# Patient Record
Sex: Female | Born: 1935 | Race: White | Hispanic: No | Marital: Married | Smoking: Never smoker
Health system: Southern US, Community
[De-identification: ages and names within clinical notes are randomized; demographics above are authoritative.]

## PROBLEM LIST (undated history)

## (undated) DIAGNOSIS — D649 Anemia, unspecified: Secondary | ICD-10-CM

## (undated) DIAGNOSIS — E785 Hyperlipidemia, unspecified: Secondary | ICD-10-CM

## (undated) DIAGNOSIS — J339 Nasal polyp, unspecified: Secondary | ICD-10-CM

## (undated) DIAGNOSIS — I119 Hypertensive heart disease without heart failure: Secondary | ICD-10-CM

## (undated) DIAGNOSIS — I10 Essential (primary) hypertension: Secondary | ICD-10-CM

## (undated) DIAGNOSIS — E119 Type 2 diabetes mellitus without complications: Secondary | ICD-10-CM

## (undated) DIAGNOSIS — R079 Chest pain, unspecified: Secondary | ICD-10-CM

## (undated) DIAGNOSIS — Z9289 Personal history of other medical treatment: Secondary | ICD-10-CM

## (undated) DIAGNOSIS — J189 Pneumonia, unspecified organism: Secondary | ICD-10-CM

## (undated) DIAGNOSIS — K219 Gastro-esophageal reflux disease without esophagitis: Secondary | ICD-10-CM

## (undated) DIAGNOSIS — N184 Chronic kidney disease, stage 4 (severe): Secondary | ICD-10-CM

## (undated) DIAGNOSIS — R0600 Dyspnea, unspecified: Secondary | ICD-10-CM

## (undated) DIAGNOSIS — M1990 Unspecified osteoarthritis, unspecified site: Secondary | ICD-10-CM

## (undated) DIAGNOSIS — I2510 Atherosclerotic heart disease of native coronary artery without angina pectoris: Secondary | ICD-10-CM

## (undated) DIAGNOSIS — I509 Heart failure, unspecified: Secondary | ICD-10-CM

## (undated) HISTORY — DX: Essential (primary) hypertension: I10

## (undated) HISTORY — DX: Atherosclerotic heart disease of native coronary artery without angina pectoris: I25.10

## (undated) HISTORY — DX: Hyperlipidemia, unspecified: E78.5

## (undated) HISTORY — PX: CHOLECYSTECTOMY OPEN: SUR202

## (undated) HISTORY — DX: Hypertensive heart disease without heart failure: I11.9

## (undated) HISTORY — PX: NECK MASS EXCISION: SHX2079

## (undated) HISTORY — DX: Nasal polyp, unspecified: J33.9

## (undated) HISTORY — PX: CATARACT EXTRACTION W/ INTRAOCULAR LENS  IMPLANT, BILATERAL: SHX1307

## (undated) HISTORY — PX: TONSILLECTOMY: SUR1361

## (undated) HISTORY — DX: Heart failure, unspecified: I50.9

## (undated) HISTORY — PX: ABDOMINAL HYSTERECTOMY: SHX81

## (undated) HISTORY — PX: NASAL POLYP SURGERY: SHX186

---
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
## 2010-02-15 DIAGNOSIS — I2510 Atherosclerotic heart disease of native coronary artery without angina pectoris: Secondary | ICD-10-CM

## 2010-02-15 HISTORY — DX: Atherosclerotic heart disease of native coronary artery without angina pectoris: I25.10

## 2010-07-10 ENCOUNTER — Encounter: Payer: Self-pay | Admitting: Cardiovascular Disease

## 2010-07-10 ENCOUNTER — Ambulatory Visit (INDEPENDENT_AMBULATORY_CARE_PROVIDER_SITE_OTHER): Payer: Medicare Other | Admitting: Cardiovascular Disease

## 2010-07-10 VITALS — BP 166/60 | HR 55 | Ht 60.0 in | Wt 162.0 lb

## 2010-07-10 DIAGNOSIS — R0600 Dyspnea, unspecified: Secondary | ICD-10-CM

## 2010-07-10 DIAGNOSIS — I509 Heart failure, unspecified: Secondary | ICD-10-CM

## 2010-07-10 DIAGNOSIS — I10 Essential (primary) hypertension: Secondary | ICD-10-CM

## 2010-07-10 DIAGNOSIS — R0609 Other forms of dyspnea: Secondary | ICD-10-CM

## 2010-07-10 DIAGNOSIS — I2510 Atherosclerotic heart disease of native coronary artery without angina pectoris: Secondary | ICD-10-CM

## 2010-07-10 DIAGNOSIS — I5032 Chronic diastolic (congestive) heart failure: Secondary | ICD-10-CM | POA: Insufficient documentation

## 2010-07-10 MED ORDER — HYDRALAZINE HCL 50 MG PO TABS: 50.0000 mg | ORAL_TABLET | Freq: Two times a day (BID) | ORAL | Status: DC

## 2010-07-10 NOTE — Assessment & Plan Note (Signed)
The patient main symptom at this time include exertional dyspnea. We'll obtain a transthoracic echocardiogram for further evaluation. Continue current dose of Lasix 40 mg once daily.

## 2010-07-10 NOTE — Assessment & Plan Note (Signed)
The patient had a slightly abnormal nuclear stress test in February of this year. She does not have any chest pain at the present time. However, she does have symptoms of generalized fatigue and dyspnea with minimal activities. I think an attempt of medical therapy is reasonable. Continue aspirin daily as well as treatment with a statin. We'll need to control her blood pressure better. Her home blood pressure readings seems to be actually better than the office reading. Will consider coronary angiography in the future if there's no improvement in her functional capacity.

## 2010-07-10 NOTE — Progress Notes (Signed)
Kelly  This is an 77 year old female who is transferring her cardiovascular care. She used to see Dr. Pati Gallo. She was referred to cardiology in the past do to diastolic heart failure which was precipitated by use of Actos. She also had refractory hypertension with intolerance of multiple blood pressure medications. Early this year, she complained of increased exertional dyspnea and few episodes of chest pain. She underwent evaluation with a nuclear stress test which showed minimal apical ischemia in the anterolateral wall with normal ejection fraction. She was treated medically. The dose of diuretic was increased. Currently, she denies any chest pain. However, she complains of dyspnea with minimal activities as well as having no energy. This has been a progressive problem since early this year. She denies any orthopnea or PND. Had lower extremity edema is mostly controlled with current dose of Lasix. His home blood pressure readings were reviewed. Mostly actually it's under reasonable control. Her blood pressure is always elevated according to her when she visits a doctor.  Allergies  Allergen Reactions  . Actos (Pioglitazone Hydrochloride)   . Lisinopril   . Monopril (Fosinopril Sodium)   . Norvasc (Amlodipine Besylate)   . Penicillins   . Plendil      No current outpatient prescriptions on file prior to visit.     Past Medical History  Diagnosis Date  . DM (diabetes mellitus)   . Dyslipidemia   . Unspecified hypertensive heart disease without heart failure   . CHF (congestive heart failure)     Diastolic. Precipitated by Actos  . Coronary artery disease 02/2010    abnormal nuclear stress test with mild ischemia in apical anterolateral wall.   . Hypertension   . Chronic kidney disease     mild-moderate. GFR was 35 in 02/12     Past Surgical History  Procedure Date  . Tonsillectomy   . Hysterectomy - unknown type   . Cholecystectomy   . Cataract extraction, bilateral   . Neck mass  excision     non cancerous     Family History  Problem Relation Age of Onset  . Diabetes    . Hypertension    . Stroke       History   Social History  . Marital Status: Married    Spouse Name: N/A    Number of Children: 3  . Years of Education: N/A   Occupational History  . retired    Social History Main Topics  . Smoking status: Never Smoker   . Smokeless tobacco: Never Used  . Alcohol Use: No  . Drug Use: No  . Sexually Active: Not on file   Other Topics Concern  . Not on file   Social History Narrative  . No narrative on file     ROS Constitutional: Negative for fever, chills, diaphoresis, activity change, appetite change. HENT: Negative for hearing loss, nosebleeds, congestion, sore throat, facial swelling, drooling, trouble swallowing, neck pain, voice change, sinus pressure and tinnitus.  Eyes: Negative for photophobia, pain, discharge and visual disturbance.  Respiratory: Negative for apnea, cough, chest tightness and wheezing.  Cardiovascular: Negative for chest pain, palpitations.  Gastrointestinal: Negative for nausea, vomiting, abdominal pain, diarrhea, constipation, blood in stool and abdominal distention.  Genitourinary: Negative for dysuria, urgency, frequency, hematuria and decreased urine volume.  Musculoskeletal: Negative for myalgias, back pain, joint swelling, arthralgias and gait problem.  Skin: Negative for color change, pallor, rash and wound.  Neurological: Negative for dizziness, tremors, seizures, syncope, speech difficulty, weakness, light-headedness, numbness  and headaches.  Psychiatric/Behavioral: Negative for suicidal ideas, hallucinations, behavioral problems and agitation. The patient is not nervous/anxious.     PHYSICAL EXAM   BP 166/60  Pulse 55  Ht 5' (1.524 m)  Wt 162 lb (73.483 kg)  BMI 31.64 kg/m2  SpO2 98% Constitutional: She is oriented to person, place, and time. She appears well-developed and well-nourished. No  distress.  HENT: No nasal discharge.  Head: Normocephalic and atraumatic.  Eyes: Pupils are equal, round, and reactive to light. Right eye exhibits no discharge. Left eye exhibits no discharge.  Neck: Normal range of motion. Neck supple. No JVD present. No thyromegaly present.  Cardiovascular: Normal rate, regular rhythm, normal heart sounds and intact distal pulses. Exam reveals no gallop and no friction rub.  No murmur heard.  Pulmonary/Chest: Effort normal and breath sounds normal. No stridor. No respiratory distress. She has no wheezes. She has no rales. She exhibits no tenderness.  Abdominal: Soft. Bowel sounds are normal. She exhibits no distension. There is no tenderness. There is no rebound and no guarding.  Musculoskeletal: Normal range of motion. She exhibits trace edema and no tenderness.  Neurological: She is alert and oriented to person, place, and time. Coordination normal.  Skin: Skin is warm and dry. No rash noted. She is not diaphoretic. No erythema. No pallor.  Psychiatric: She has a normal mood and affect. Her behavior is normal. Judgment and thought content normal.     EKG: Sinus bradycardia, left ventricular hypertrophy, ST and T wave changes in the inferior as well as inferolateral leads suggestive of ischemia.   ASSESSMENT AND PLAN

## 2010-07-10 NOTE — Patient Instructions (Addendum)
Your physician recommends that you schedule a follow-up appointment in: 1 month  Your physician has recommended you make the following change in your medication: decrease Clonidine to 1/2 tab for the next 2 days then stop Start Hydralazine 50 mg twice daily  Your physician has requested that you have an echocardiogram. Echocardiography is a painless test that uses sound waves to create images of your heart. It provides your doctor with information about the size and shape of your heart and how well your heart's chambers and valves are working. This procedure takes approximately one hour. There are no restrictions for this procedure. Please arrive at [facility] at 12:30 for your Echo.

## 2010-07-10 NOTE — Assessment & Plan Note (Signed)
The patient complains of generalized fatigue which could partially be due to bradycardia as well as central effect of clonidine. Thus, I'd like to get her off clonidine if possible. Unfortunately, she is intolerant to multiple blood pressure medications. Calcium channel blockers goes significant lower extremity edema. She also thinks that hydrochlorothiazide was used and she did not tolerate it.  I will gradually stop clonidine over 2 days and start hydralazine 50 mg twice daily. I asked her to monitor her blood pressure daily. She will followup with me in one month from now or earlier if needed.

## 2010-07-17 ENCOUNTER — Telehealth: Payer: Self-pay | Admitting: *Deleted

## 2010-07-17 MED ORDER — HYDRALAZINE HCL 50 MG PO TABS: 25.0000 mg | ORAL_TABLET | Freq: Two times a day (BID) | ORAL | Status: DC

## 2010-07-17 NOTE — Telephone Encounter (Signed)
She is feeling weak and fatigued, her BP has been low -- 30th 115/53, 1st 109/51, 2nd 111/53 & 118/60. Last time she was in we added Hydralazine 50 mg twice daily. Spoke to Dr Fletcher Anon and he said to have her cut the pill in half and take 25 mg twice a day. Pt aware.

## 2010-08-03 ENCOUNTER — Other Ambulatory Visit: Payer: Self-pay | Admitting: *Deleted

## 2010-08-03 MED ORDER — CARVEDILOL 12.5 MG PO TABS: 12.5000 mg | ORAL_TABLET | Freq: Two times a day (BID) | ORAL | Status: DC

## 2010-08-10 ENCOUNTER — Encounter: Payer: Self-pay | Admitting: Cardiovascular Disease

## 2010-08-14 ENCOUNTER — Encounter: Payer: Self-pay | Admitting: Cardiovascular Disease

## 2010-08-14 ENCOUNTER — Ambulatory Visit (INDEPENDENT_AMBULATORY_CARE_PROVIDER_SITE_OTHER): Payer: Medicare Other | Admitting: Cardiovascular Disease

## 2010-08-14 DIAGNOSIS — I2510 Atherosclerotic heart disease of native coronary artery without angina pectoris: Secondary | ICD-10-CM

## 2010-08-14 DIAGNOSIS — I509 Heart failure, unspecified: Secondary | ICD-10-CM

## 2010-08-14 DIAGNOSIS — I10 Essential (primary) hypertension: Secondary | ICD-10-CM

## 2010-08-14 NOTE — Progress Notes (Signed)
HPI  This is an 77 year old female who is here today for followup visit. She was just seen recently to establish cardiovascular care. She has presumed coronary artery disease based on a slightly abnormal nuclear stress test. She had heart failure which was triggered by the use of Actos. She also has history of refractory hypertension with intolerance to multiple medications. During her last visit, she complained of slight dizziness and being tired and fatigued. This was felt to be due to bradycardia from clonidine. Thus I weaned her off the medication and start her instead on hydralazine 50 mg twice daily. She called our office about a week later complaining of hypotension. I decreased the dose to 25 mg twice daily. Since then, she has been feeling better. She actually has more energy. Her blood pressure has been under excellent control according to her home blood pressure readings. Most of the time, her systolic blood pressure is ranging between 115 to 130. She denies any chest pain at this time.  Allergies  Allergen Reactions  . Actos (Pioglitazone Hydrochloride)   . Hctz (Hydrochlorothiazide)   . Lisinopril   . Monopril (Fosinopril Sodium)   . Norvasc (Amlodipine Besylate)   . Penicillins   . Plendil      Current Outpatient Prescriptions on File Prior to Visit  Medication Sig Dispense Refill  . amitriptyline (ELAVIL) 25 MG tablet Take 1 tablet by mouth daily.      [name] aspirin 81 MG tablet Take 81 mg by mouth daily.        . brimonidine-timolol (COMBIGAN) 0.2-0.5 % ophthalmic solution Place 1 drop into the left eye daily.       . carvedilol (COREG) 12.5 MG tablet Take 1 tablet (12.5 mg total) by mouth 2 (two) times daily with a meal.  60 tablet  6  . CRESTOR 5 MG tablet Take 1 tablet by mouth daily.      [name] dicyclomine (BENTYL) 10 MG capsule Take 10 mg by mouth 4 (four) times daily -  before meals and at bedtime.        . furosemide (LASIX) 40 MG tablet Take 1 tablet by mouth daily.      [name]  glipiZIDE (GLUCOTROL) 5 MG tablet Take 0.5 tablets by mouth Twice daily.      . hydrALAZINE (APRESOLINE) 50 MG tablet Take 0.5 tablets (25 mg total) by mouth 2 (two) times daily.  60 tablet  3  . losartan (COZAAR) 100 MG tablet Take 1 tablet by mouth daily.      [name] omeprazole (PRILOSEC OTC) 20 MG tablet Take 20 mg by mouth daily.        . TRAVATAN Z 0.004 % ophthalmic solution Place 1 drop into both eyes At bedtime.         Past Medical History  Diagnosis Date  . DM (diabetes mellitus)   . Dyslipidemia   . Unspecified hypertensive heart disease without heart failure   . CHF (congestive heart failure)     Diastolic. Precipitated by Actos  . Coronary artery disease 02/2010    abnormal nuclear stress test with mild ischemia in apical anterolateral wall.   . Hypertension   . Chronic kidney disease     mild-moderate. GFR was 35 in 02/12     Past Surgical History  Procedure Date  . Tonsillectomy   . Hysterectomy - unknown type   . Cholecystectomy   . Cataract extraction, bilateral   . Neck mass excision     non cancerous  Family History  Problem Relation Age of Onset  . Diabetes    . Hypertension    . Stroke       History   Social History  . Marital Status: Married    Spouse Name: N/A    Number of Children: 3  . Years of Education: N/A   Occupational History  . retired    Social History Main Topics  . Smoking status: Never Smoker   . Smokeless tobacco: Never Used  . Alcohol Use: No  . Drug Use: No  . Sexually Active: Not on file   Other Topics Concern  . Not on file   Social History Narrative  . No narrative on file       PHYSICAL EXAM   BP 155/70  Pulse 69  Ht 5' (1.524 m)  Wt 162 lb (73.483 kg)  BMI 31.64 kg/m2  SpO2 96%  Constitutional: She is oriented to person, place, and time. She appears well-developed and well-nourished. No distress.  HENT: No nasal discharge.  Head: Normocephalic and atraumatic.  Eyes: Pupils are equal, round,  and reactive to light. Right eye exhibits no discharge. Left eye exhibits no discharge.  Neck: Normal range of motion. Neck supple. No JVD present. No thyromegaly present.  Cardiovascular: Normal rate, regular rhythm, normal heart sounds and intact distal pulses. Exam reveals no gallop and no friction rub.  No murmur heard.  Pulmonary/Chest: Effort normal and breath sounds normal. No stridor. No respiratory distress. She has no wheezes. She has no rales. She exhibits no tenderness.  Abdominal: Soft. Bowel sounds are normal. She exhibits no distension. There is no tenderness. There is no rebound and no guarding.  Musculoskeletal: Normal range of motion. She exhibits no edema and no tenderness.  Neurological: She is alert and oriented to person, place, and time. Coordination normal.  Skin: Skin is warm and dry. No rash noted. She is not diaphoretic. No erythema. No pallor.  Psychiatric: She has a normal mood and affect. Her behavior is normal. Judgment and thought content normal.      ASSESSMENT AND PLAN

## 2010-08-14 NOTE — Assessment & Plan Note (Signed)
This overall seems to be mild and do to diastolic dysfunction. Her echocardiogram showed normal LV systolic function without any significant valvular abnormalities. Continue current dose of furosemide 40 mg once daily. I discussed with her the importance of low sodium diet and also suggested that she weighs herself twice a week.

## 2010-08-14 NOTE — Assessment & Plan Note (Signed)
The patient has no clear evidence of angina at this time. Continue medical therapy with aspirin 81 mg daily, carvedilol and small dose Crestor.

## 2010-08-14 NOTE — Patient Instructions (Signed)
Your physician recommends that you schedule a follow-up appointment in: 3 months.  

## 2010-08-14 NOTE — Assessment & Plan Note (Signed)
Her blood pressure is reasonably controlled now. Will keep her off clonidine. Continue hydralazine 25 mg twice daily which can be increased if needed. I instructed her to take 50 mg of her systolic blood pressure is about 160.

## 2010-11-17 ENCOUNTER — Ambulatory Visit (INDEPENDENT_AMBULATORY_CARE_PROVIDER_SITE_OTHER): Payer: Medicare Other | Admitting: Cardiology

## 2010-11-17 ENCOUNTER — Encounter: Payer: Self-pay | Admitting: Cardiology

## 2010-11-17 DIAGNOSIS — I509 Heart failure, unspecified: Secondary | ICD-10-CM

## 2010-11-17 DIAGNOSIS — I2510 Atherosclerotic heart disease of native coronary artery without angina pectoris: Secondary | ICD-10-CM

## 2010-11-17 DIAGNOSIS — I10 Essential (primary) hypertension: Secondary | ICD-10-CM

## 2010-11-17 NOTE — Patient Instructions (Signed)
The current medical regimen is effective;  continue present plan and medications.  Follow up in 1 year with Dr Hochrein.  You will receive a letter in the mail 2 months before you are due.  Please call us when you receive this letter to schedule your follow up appointment.  

## 2010-11-17 NOTE — Progress Notes (Signed)
HPI The patient presents as a new patient to me. She was formerly seen by Dr. Fletcher Anon.  She has a history apparently of congestive heart failure with a preserved ejection fraction. This was particularly problematic when she was taking Actos. However, since coming off of this medication and having good control of her blood pressure she's had no further problems with the dyspnea that used to bother her. She can ride her exercise bicycle 3 miles daily. With this she does not get chest pressure, neck or arm discomfort. She might get short of breath walking at an increased pace if she is in a hurry. She does not have any resting shortness of breath, PND or orthopnea.  Allergies  Allergen Reactions  . Actos (Pioglitazone Hydrochloride)   . Hctz (Hydrochlorothiazide)   . Lisinopril   . Monopril (Fosinopril Sodium)   . Norvasc (Amlodipine Besylate)   . Penicillins   . Plendil     Current Outpatient Prescriptions  Medication Sig Dispense Refill  . amitriptyline (ELAVIL) 25 MG tablet Take 1 tablet by mouth daily.      [name] aspirin 81 MG tablet Take 81 mg by mouth daily.        . brimonidine-timolol (COMBIGAN) 0.2-0.5 % ophthalmic solution Place 1 drop into the left eye daily.       . carvedilol (COREG) 12.5 MG tablet Take 1 tablet (12.5 mg total) by mouth 2 (two) times daily with a meal.  60 tablet  6  . CRESTOR 5 MG tablet Take 1 tablet by mouth daily.      [name] dicyclomine (BENTYL) 10 MG capsule Take 10 mg by mouth 4 (four) times daily -  before meals and at bedtime.        . furosemide (LASIX) 40 MG tablet Take 1 tablet by mouth daily.      [name] glipiZIDE (GLUCOTROL) 5 MG tablet Take 0.5 tablets by mouth Twice daily.      . hydrALAZINE (APRESOLINE) 50 MG tablet Take 0.5 tablets (25 mg total) by mouth 2 (two) times daily.  60 tablet  3  . LANTUS SOLOSTAR 100 UNIT/ML injection as directed.      [name] losartan (COZAAR) 100 MG tablet Take 1 tablet by mouth daily.      [name] omeprazole (PRILOSEC OTC) 20 MG tablet Take  20 mg by mouth daily.        . TRAVATAN Z 0.004 % ophthalmic solution Place 1 drop into both eyes At bedtime.      . predniSONE (DELTASONE) 5 MG tablet Take 5 mg by mouth daily.          Past Medical History  Diagnosis Date  . DM (diabetes mellitus)   . Dyslipidemia   . Unspecified hypertensive heart disease without heart failure   . CHF (congestive heart failure)     Diastolic. Precipitated by Actos  . Coronary artery disease 02/2010    abnormal nuclear stress test with mild ischemia in apical anterolateral wall.   . Hypertension   . Chronic kidney disease     mild-moderate. GFR was 35 in 02/12    Past Surgical History  Procedure Date  . Tonsillectomy   . Hysterectomy - unknown type   . Cholecystectomy   . Cataract extraction, bilateral   . Neck mass excision     non cancerous    ROS:  As stated in the HPI and negative for all other systems.  PHYSICAL EXAM BP 140/72  Pulse 68  Resp 18  Ht  5' (1.524 m)  Wt 160 lb (72.576 kg)  BMI 31.25 kg/m2 GENERAL:  Well appearing HEENT:  Pupils equal round and reactive, fundi not visualized, oral mucosa unremarkable NECK:  No jugular venous distention, waveform within normal limits, carotid upstroke brisk and symmetric, no bruits, no thyromegaly LYMPHATICS:  No cervical, inguinal adenopathy LUNGS:  Clear to auscultation bilaterally BACK:  No CVA tenderness CHEST:  Unremarkable HEART:  PMI not displaced or sustained,S1 and S2 within normal limits, no S3, no S4, no clicks, no rubs, no murmurs ABD:  Flat, positive bowel sounds normal in frequency in pitch, no bruits, no rebound, no guarding, no midline pulsatile mass, no hepatomegaly, no splenomegaly EXT:  2 plus pulses throughout, no edema, no cyanosis no clubbing SKIN:  No rashes no nodules NEURO:  Cranial nerves II through XII grossly intact, motor grossly intact throughout PSYCH:  Cognitively intact, oriented to person place and time  EKG: Sinus rhythm, rate 61, lethargy  hypertrophy, left axis deviation, repolarization changes  ASSESSMENT AND PLAN

## 2010-11-17 NOTE — Assessment & Plan Note (Signed)
I reviewed all of the available office records. She had an echocardiogram earlier this year. She had a stress perfusion study. Currently she has minimal symptoms. We discussed at length management with blood pressure control, salt and fluid restriction and diuretics. No further imaging or change in therapy is indicated.

## 2010-11-17 NOTE — Assessment & Plan Note (Signed)
She reports to me her blood pressures are 130s over 60s at home. She tolerates the medications currently listed. No change in therapy is indicated. We again talked about therapeutic lifestyle changes that can continue to help her keep good blood pressure control.

## 2010-11-17 NOTE — Assessment & Plan Note (Signed)
She did have a stress perfusion study with very mild ischemia in the apical distribution. However, this was felt to be quite low risk. She's not having any current symptoms. No change in therapy or further imaging is indicated.

## 2011-01-25 DIAGNOSIS — Z23 Encounter for immunization: Secondary | ICD-10-CM | POA: Diagnosis not present

## 2011-01-25 DIAGNOSIS — I10 Essential (primary) hypertension: Secondary | ICD-10-CM | POA: Diagnosis not present

## 2011-01-25 DIAGNOSIS — E119 Type 2 diabetes mellitus without complications: Secondary | ICD-10-CM | POA: Diagnosis not present

## 2011-01-25 DIAGNOSIS — E7800 Pure hypercholesterolemia, unspecified: Secondary | ICD-10-CM | POA: Diagnosis not present

## 2011-02-20 DIAGNOSIS — H4011X0 Primary open-angle glaucoma, stage unspecified: Secondary | ICD-10-CM | POA: Diagnosis not present

## 2011-02-20 DIAGNOSIS — Z961 Presence of intraocular lens: Secondary | ICD-10-CM | POA: Diagnosis not present

## 2011-02-20 DIAGNOSIS — E11319 Type 2 diabetes mellitus with unspecified diabetic retinopathy without macular edema: Secondary | ICD-10-CM | POA: Diagnosis not present

## 2011-02-27 DIAGNOSIS — E11319 Type 2 diabetes mellitus with unspecified diabetic retinopathy without macular edema: Secondary | ICD-10-CM | POA: Diagnosis not present

## 2011-02-27 DIAGNOSIS — H4011X0 Primary open-angle glaucoma, stage unspecified: Secondary | ICD-10-CM | POA: Diagnosis not present

## 2011-04-30 DIAGNOSIS — M5412 Radiculopathy, cervical region: Secondary | ICD-10-CM | POA: Diagnosis not present

## 2011-04-30 DIAGNOSIS — G5680 Other specified mononeuropathies of unspecified upper limb: Secondary | ICD-10-CM | POA: Diagnosis not present

## 2011-04-30 DIAGNOSIS — IMO0002 Reserved for concepts with insufficient information to code with codable children: Secondary | ICD-10-CM | POA: Diagnosis not present

## 2011-04-30 DIAGNOSIS — M9981 Other biomechanical lesions of cervical region: Secondary | ICD-10-CM | POA: Diagnosis not present

## 2011-04-30 DIAGNOSIS — M999 Biomechanical lesion, unspecified: Secondary | ICD-10-CM | POA: Diagnosis not present

## 2011-05-01 DIAGNOSIS — M5412 Radiculopathy, cervical region: Secondary | ICD-10-CM | POA: Diagnosis not present

## 2011-05-01 DIAGNOSIS — IMO0002 Reserved for concepts with insufficient information to code with codable children: Secondary | ICD-10-CM | POA: Diagnosis not present

## 2011-05-01 DIAGNOSIS — M999 Biomechanical lesion, unspecified: Secondary | ICD-10-CM | POA: Diagnosis not present

## 2011-05-01 DIAGNOSIS — M9981 Other biomechanical lesions of cervical region: Secondary | ICD-10-CM | POA: Diagnosis not present

## 2011-05-02 DIAGNOSIS — M999 Biomechanical lesion, unspecified: Secondary | ICD-10-CM | POA: Diagnosis not present

## 2011-05-02 DIAGNOSIS — M9981 Other biomechanical lesions of cervical region: Secondary | ICD-10-CM | POA: Diagnosis not present

## 2011-05-02 DIAGNOSIS — IMO0002 Reserved for concepts with insufficient information to code with codable children: Secondary | ICD-10-CM | POA: Diagnosis not present

## 2011-05-02 DIAGNOSIS — M5412 Radiculopathy, cervical region: Secondary | ICD-10-CM | POA: Diagnosis not present

## 2011-05-03 DIAGNOSIS — M9981 Other biomechanical lesions of cervical region: Secondary | ICD-10-CM | POA: Diagnosis not present

## 2011-05-03 DIAGNOSIS — M999 Biomechanical lesion, unspecified: Secondary | ICD-10-CM | POA: Diagnosis not present

## 2011-05-03 DIAGNOSIS — IMO0002 Reserved for concepts with insufficient information to code with codable children: Secondary | ICD-10-CM | POA: Diagnosis not present

## 2011-05-03 DIAGNOSIS — M5412 Radiculopathy, cervical region: Secondary | ICD-10-CM | POA: Diagnosis not present

## 2011-05-07 DIAGNOSIS — M999 Biomechanical lesion, unspecified: Secondary | ICD-10-CM | POA: Diagnosis not present

## 2011-05-07 DIAGNOSIS — M9981 Other biomechanical lesions of cervical region: Secondary | ICD-10-CM | POA: Diagnosis not present

## 2011-05-07 DIAGNOSIS — M5412 Radiculopathy, cervical region: Secondary | ICD-10-CM | POA: Diagnosis not present

## 2011-05-07 DIAGNOSIS — IMO0002 Reserved for concepts with insufficient information to code with codable children: Secondary | ICD-10-CM | POA: Diagnosis not present

## 2011-05-08 DIAGNOSIS — M5412 Radiculopathy, cervical region: Secondary | ICD-10-CM | POA: Diagnosis not present

## 2011-05-08 DIAGNOSIS — IMO0002 Reserved for concepts with insufficient information to code with codable children: Secondary | ICD-10-CM | POA: Diagnosis not present

## 2011-05-08 DIAGNOSIS — M9981 Other biomechanical lesions of cervical region: Secondary | ICD-10-CM | POA: Diagnosis not present

## 2011-05-08 DIAGNOSIS — M999 Biomechanical lesion, unspecified: Secondary | ICD-10-CM | POA: Diagnosis not present

## 2011-05-09 DIAGNOSIS — M999 Biomechanical lesion, unspecified: Secondary | ICD-10-CM | POA: Diagnosis not present

## 2011-05-09 DIAGNOSIS — IMO0002 Reserved for concepts with insufficient information to code with codable children: Secondary | ICD-10-CM | POA: Diagnosis not present

## 2011-05-09 DIAGNOSIS — M5412 Radiculopathy, cervical region: Secondary | ICD-10-CM | POA: Diagnosis not present

## 2011-05-09 DIAGNOSIS — M9981 Other biomechanical lesions of cervical region: Secondary | ICD-10-CM | POA: Diagnosis not present

## 2011-05-10 DIAGNOSIS — IMO0002 Reserved for concepts with insufficient information to code with codable children: Secondary | ICD-10-CM | POA: Diagnosis not present

## 2011-05-10 DIAGNOSIS — M5412 Radiculopathy, cervical region: Secondary | ICD-10-CM | POA: Diagnosis not present

## 2011-05-10 DIAGNOSIS — M999 Biomechanical lesion, unspecified: Secondary | ICD-10-CM | POA: Diagnosis not present

## 2011-05-10 DIAGNOSIS — M9981 Other biomechanical lesions of cervical region: Secondary | ICD-10-CM | POA: Diagnosis not present

## 2011-05-14 DIAGNOSIS — IMO0002 Reserved for concepts with insufficient information to code with codable children: Secondary | ICD-10-CM | POA: Diagnosis not present

## 2011-05-14 DIAGNOSIS — M9981 Other biomechanical lesions of cervical region: Secondary | ICD-10-CM | POA: Diagnosis not present

## 2011-05-14 DIAGNOSIS — M5412 Radiculopathy, cervical region: Secondary | ICD-10-CM | POA: Diagnosis not present

## 2011-05-14 DIAGNOSIS — M999 Biomechanical lesion, unspecified: Secondary | ICD-10-CM | POA: Diagnosis not present

## 2011-05-15 DIAGNOSIS — M5412 Radiculopathy, cervical region: Secondary | ICD-10-CM | POA: Diagnosis not present

## 2011-05-15 DIAGNOSIS — M999 Biomechanical lesion, unspecified: Secondary | ICD-10-CM | POA: Diagnosis not present

## 2011-05-15 DIAGNOSIS — IMO0002 Reserved for concepts with insufficient information to code with codable children: Secondary | ICD-10-CM | POA: Diagnosis not present

## 2011-05-15 DIAGNOSIS — M9981 Other biomechanical lesions of cervical region: Secondary | ICD-10-CM | POA: Diagnosis not present

## 2011-05-17 DIAGNOSIS — M9981 Other biomechanical lesions of cervical region: Secondary | ICD-10-CM | POA: Diagnosis not present

## 2011-05-17 DIAGNOSIS — IMO0002 Reserved for concepts with insufficient information to code with codable children: Secondary | ICD-10-CM | POA: Diagnosis not present

## 2011-05-17 DIAGNOSIS — M5412 Radiculopathy, cervical region: Secondary | ICD-10-CM | POA: Diagnosis not present

## 2011-05-17 DIAGNOSIS — M999 Biomechanical lesion, unspecified: Secondary | ICD-10-CM | POA: Diagnosis not present

## 2011-05-22 DIAGNOSIS — M5412 Radiculopathy, cervical region: Secondary | ICD-10-CM | POA: Diagnosis not present

## 2011-05-22 DIAGNOSIS — M9981 Other biomechanical lesions of cervical region: Secondary | ICD-10-CM | POA: Diagnosis not present

## 2011-05-22 DIAGNOSIS — M999 Biomechanical lesion, unspecified: Secondary | ICD-10-CM | POA: Diagnosis not present

## 2011-05-22 DIAGNOSIS — IMO0002 Reserved for concepts with insufficient information to code with codable children: Secondary | ICD-10-CM | POA: Diagnosis not present

## 2011-05-23 DIAGNOSIS — M9981 Other biomechanical lesions of cervical region: Secondary | ICD-10-CM | POA: Diagnosis not present

## 2011-05-23 DIAGNOSIS — M5412 Radiculopathy, cervical region: Secondary | ICD-10-CM | POA: Diagnosis not present

## 2011-05-23 DIAGNOSIS — IMO0002 Reserved for concepts with insufficient information to code with codable children: Secondary | ICD-10-CM | POA: Diagnosis not present

## 2011-05-23 DIAGNOSIS — M999 Biomechanical lesion, unspecified: Secondary | ICD-10-CM | POA: Diagnosis not present

## 2011-05-24 DIAGNOSIS — M9981 Other biomechanical lesions of cervical region: Secondary | ICD-10-CM | POA: Diagnosis not present

## 2011-05-24 DIAGNOSIS — M5412 Radiculopathy, cervical region: Secondary | ICD-10-CM | POA: Diagnosis not present

## 2011-05-24 DIAGNOSIS — M999 Biomechanical lesion, unspecified: Secondary | ICD-10-CM | POA: Diagnosis not present

## 2011-05-24 DIAGNOSIS — IMO0002 Reserved for concepts with insufficient information to code with codable children: Secondary | ICD-10-CM | POA: Diagnosis not present

## 2011-05-28 DIAGNOSIS — M5412 Radiculopathy, cervical region: Secondary | ICD-10-CM | POA: Diagnosis not present

## 2011-05-28 DIAGNOSIS — M9981 Other biomechanical lesions of cervical region: Secondary | ICD-10-CM | POA: Diagnosis not present

## 2011-05-28 DIAGNOSIS — M999 Biomechanical lesion, unspecified: Secondary | ICD-10-CM | POA: Diagnosis not present

## 2011-05-28 DIAGNOSIS — IMO0002 Reserved for concepts with insufficient information to code with codable children: Secondary | ICD-10-CM | POA: Diagnosis not present

## 2011-05-31 DIAGNOSIS — M5412 Radiculopathy, cervical region: Secondary | ICD-10-CM | POA: Diagnosis not present

## 2011-05-31 DIAGNOSIS — H35019 Changes in retinal vascular appearance, unspecified eye: Secondary | ICD-10-CM | POA: Diagnosis not present

## 2011-05-31 DIAGNOSIS — M9981 Other biomechanical lesions of cervical region: Secondary | ICD-10-CM | POA: Diagnosis not present

## 2011-05-31 DIAGNOSIS — H4011X0 Primary open-angle glaucoma, stage unspecified: Secondary | ICD-10-CM | POA: Diagnosis not present

## 2011-05-31 DIAGNOSIS — H34239 Retinal artery branch occlusion, unspecified eye: Secondary | ICD-10-CM | POA: Diagnosis not present

## 2011-05-31 DIAGNOSIS — M999 Biomechanical lesion, unspecified: Secondary | ICD-10-CM | POA: Diagnosis not present

## 2011-05-31 DIAGNOSIS — IMO0002 Reserved for concepts with insufficient information to code with codable children: Secondary | ICD-10-CM | POA: Diagnosis not present

## 2011-06-04 DIAGNOSIS — M5412 Radiculopathy, cervical region: Secondary | ICD-10-CM | POA: Diagnosis not present

## 2011-06-04 DIAGNOSIS — M999 Biomechanical lesion, unspecified: Secondary | ICD-10-CM | POA: Diagnosis not present

## 2011-06-04 DIAGNOSIS — M9981 Other biomechanical lesions of cervical region: Secondary | ICD-10-CM | POA: Diagnosis not present

## 2011-06-04 DIAGNOSIS — IMO0002 Reserved for concepts with insufficient information to code with codable children: Secondary | ICD-10-CM | POA: Diagnosis not present

## 2011-06-05 ENCOUNTER — Encounter (INDEPENDENT_AMBULATORY_CARE_PROVIDER_SITE_OTHER): Payer: Medicare Other | Admitting: Ophthalmology

## 2011-06-05 DIAGNOSIS — E1139 Type 2 diabetes mellitus with other diabetic ophthalmic complication: Secondary | ICD-10-CM

## 2011-06-05 DIAGNOSIS — H3530 Unspecified macular degeneration: Secondary | ICD-10-CM | POA: Diagnosis not present

## 2011-06-05 DIAGNOSIS — I10 Essential (primary) hypertension: Secondary | ICD-10-CM | POA: Diagnosis not present

## 2011-06-05 DIAGNOSIS — E11319 Type 2 diabetes mellitus with unspecified diabetic retinopathy without macular edema: Secondary | ICD-10-CM

## 2011-06-05 DIAGNOSIS — H348392 Tributary (branch) retinal vein occlusion, unspecified eye, stable: Secondary | ICD-10-CM

## 2011-06-05 DIAGNOSIS — H35039 Hypertensive retinopathy, unspecified eye: Secondary | ICD-10-CM | POA: Diagnosis not present

## 2011-06-05 DIAGNOSIS — H43819 Vitreous degeneration, unspecified eye: Secondary | ICD-10-CM

## 2011-06-07 DIAGNOSIS — M9981 Other biomechanical lesions of cervical region: Secondary | ICD-10-CM | POA: Diagnosis not present

## 2011-06-07 DIAGNOSIS — M5412 Radiculopathy, cervical region: Secondary | ICD-10-CM | POA: Diagnosis not present

## 2011-06-07 DIAGNOSIS — IMO0002 Reserved for concepts with insufficient information to code with codable children: Secondary | ICD-10-CM | POA: Diagnosis not present

## 2011-06-07 DIAGNOSIS — M999 Biomechanical lesion, unspecified: Secondary | ICD-10-CM | POA: Diagnosis not present

## 2011-06-12 DIAGNOSIS — M5412 Radiculopathy, cervical region: Secondary | ICD-10-CM | POA: Diagnosis not present

## 2011-06-12 DIAGNOSIS — M9981 Other biomechanical lesions of cervical region: Secondary | ICD-10-CM | POA: Diagnosis not present

## 2011-06-12 DIAGNOSIS — IMO0002 Reserved for concepts with insufficient information to code with codable children: Secondary | ICD-10-CM | POA: Diagnosis not present

## 2011-06-12 DIAGNOSIS — M999 Biomechanical lesion, unspecified: Secondary | ICD-10-CM | POA: Diagnosis not present

## 2011-06-13 DIAGNOSIS — H4011X0 Primary open-angle glaucoma, stage unspecified: Secondary | ICD-10-CM | POA: Diagnosis not present

## 2011-06-13 DIAGNOSIS — Z961 Presence of intraocular lens: Secondary | ICD-10-CM | POA: Diagnosis not present

## 2011-06-13 DIAGNOSIS — H35379 Puckering of macula, unspecified eye: Secondary | ICD-10-CM | POA: Diagnosis not present

## 2011-06-13 DIAGNOSIS — E11319 Type 2 diabetes mellitus with unspecified diabetic retinopathy without macular edema: Secondary | ICD-10-CM | POA: Diagnosis not present

## 2011-06-14 DIAGNOSIS — M5412 Radiculopathy, cervical region: Secondary | ICD-10-CM | POA: Diagnosis not present

## 2011-06-14 DIAGNOSIS — M9981 Other biomechanical lesions of cervical region: Secondary | ICD-10-CM | POA: Diagnosis not present

## 2011-06-14 DIAGNOSIS — M999 Biomechanical lesion, unspecified: Secondary | ICD-10-CM | POA: Diagnosis not present

## 2011-06-14 DIAGNOSIS — IMO0002 Reserved for concepts with insufficient information to code with codable children: Secondary | ICD-10-CM | POA: Diagnosis not present

## 2011-06-18 DIAGNOSIS — M5412 Radiculopathy, cervical region: Secondary | ICD-10-CM | POA: Diagnosis not present

## 2011-06-18 DIAGNOSIS — IMO0002 Reserved for concepts with insufficient information to code with codable children: Secondary | ICD-10-CM | POA: Diagnosis not present

## 2011-06-18 DIAGNOSIS — M9981 Other biomechanical lesions of cervical region: Secondary | ICD-10-CM | POA: Diagnosis not present

## 2011-06-18 DIAGNOSIS — M999 Biomechanical lesion, unspecified: Secondary | ICD-10-CM | POA: Diagnosis not present

## 2011-06-20 DIAGNOSIS — E119 Type 2 diabetes mellitus without complications: Secondary | ICD-10-CM | POA: Diagnosis not present

## 2011-06-20 DIAGNOSIS — H348392 Tributary (branch) retinal vein occlusion, unspecified eye, stable: Secondary | ICD-10-CM | POA: Diagnosis not present

## 2011-06-20 DIAGNOSIS — Z961 Presence of intraocular lens: Secondary | ICD-10-CM | POA: Diagnosis not present

## 2011-06-20 DIAGNOSIS — H4011X0 Primary open-angle glaucoma, stage unspecified: Secondary | ICD-10-CM | POA: Diagnosis not present

## 2011-06-21 DIAGNOSIS — M5412 Radiculopathy, cervical region: Secondary | ICD-10-CM | POA: Diagnosis not present

## 2011-06-21 DIAGNOSIS — IMO0002 Reserved for concepts with insufficient information to code with codable children: Secondary | ICD-10-CM | POA: Diagnosis not present

## 2011-06-21 DIAGNOSIS — M9981 Other biomechanical lesions of cervical region: Secondary | ICD-10-CM | POA: Diagnosis not present

## 2011-06-21 DIAGNOSIS — M999 Biomechanical lesion, unspecified: Secondary | ICD-10-CM | POA: Diagnosis not present

## 2011-06-27 DIAGNOSIS — H409 Unspecified glaucoma: Secondary | ICD-10-CM | POA: Diagnosis not present

## 2011-06-27 DIAGNOSIS — H4011X0 Primary open-angle glaucoma, stage unspecified: Secondary | ICD-10-CM | POA: Diagnosis not present

## 2011-06-28 DIAGNOSIS — M5412 Radiculopathy, cervical region: Secondary | ICD-10-CM | POA: Diagnosis not present

## 2011-06-28 DIAGNOSIS — M999 Biomechanical lesion, unspecified: Secondary | ICD-10-CM | POA: Diagnosis not present

## 2011-06-28 DIAGNOSIS — M9981 Other biomechanical lesions of cervical region: Secondary | ICD-10-CM | POA: Diagnosis not present

## 2011-06-28 DIAGNOSIS — IMO0002 Reserved for concepts with insufficient information to code with codable children: Secondary | ICD-10-CM | POA: Diagnosis not present

## 2011-07-04 DIAGNOSIS — M9981 Other biomechanical lesions of cervical region: Secondary | ICD-10-CM | POA: Diagnosis not present

## 2011-07-04 DIAGNOSIS — H409 Unspecified glaucoma: Secondary | ICD-10-CM | POA: Diagnosis not present

## 2011-07-04 DIAGNOSIS — M999 Biomechanical lesion, unspecified: Secondary | ICD-10-CM | POA: Diagnosis not present

## 2011-07-04 DIAGNOSIS — IMO0002 Reserved for concepts with insufficient information to code with codable children: Secondary | ICD-10-CM | POA: Diagnosis not present

## 2011-07-04 DIAGNOSIS — M5412 Radiculopathy, cervical region: Secondary | ICD-10-CM | POA: Diagnosis not present

## 2011-07-04 DIAGNOSIS — H4011X0 Primary open-angle glaucoma, stage unspecified: Secondary | ICD-10-CM | POA: Diagnosis not present

## 2011-07-25 DIAGNOSIS — Z961 Presence of intraocular lens: Secondary | ICD-10-CM | POA: Diagnosis not present

## 2011-07-25 DIAGNOSIS — E119 Type 2 diabetes mellitus without complications: Secondary | ICD-10-CM | POA: Diagnosis not present

## 2011-07-25 DIAGNOSIS — H4011X0 Primary open-angle glaucoma, stage unspecified: Secondary | ICD-10-CM | POA: Diagnosis not present

## 2011-07-31 DIAGNOSIS — M9981 Other biomechanical lesions of cervical region: Secondary | ICD-10-CM | POA: Diagnosis not present

## 2011-07-31 DIAGNOSIS — IMO0002 Reserved for concepts with insufficient information to code with codable children: Secondary | ICD-10-CM | POA: Diagnosis not present

## 2011-07-31 DIAGNOSIS — K219 Gastro-esophageal reflux disease without esophagitis: Secondary | ICD-10-CM | POA: Diagnosis not present

## 2011-07-31 DIAGNOSIS — M5412 Radiculopathy, cervical region: Secondary | ICD-10-CM | POA: Diagnosis not present

## 2011-07-31 DIAGNOSIS — E7800 Pure hypercholesterolemia, unspecified: Secondary | ICD-10-CM | POA: Diagnosis not present

## 2011-07-31 DIAGNOSIS — E119 Type 2 diabetes mellitus without complications: Secondary | ICD-10-CM | POA: Diagnosis not present

## 2011-07-31 DIAGNOSIS — M999 Biomechanical lesion, unspecified: Secondary | ICD-10-CM | POA: Diagnosis not present

## 2011-08-21 DIAGNOSIS — M9981 Other biomechanical lesions of cervical region: Secondary | ICD-10-CM | POA: Diagnosis not present

## 2011-08-21 DIAGNOSIS — IMO0002 Reserved for concepts with insufficient information to code with codable children: Secondary | ICD-10-CM | POA: Diagnosis not present

## 2011-08-21 DIAGNOSIS — M999 Biomechanical lesion, unspecified: Secondary | ICD-10-CM | POA: Diagnosis not present

## 2011-08-21 DIAGNOSIS — M5412 Radiculopathy, cervical region: Secondary | ICD-10-CM | POA: Diagnosis not present

## 2011-09-05 ENCOUNTER — Encounter (INDEPENDENT_AMBULATORY_CARE_PROVIDER_SITE_OTHER): Payer: Medicare Other | Admitting: Ophthalmology

## 2011-09-05 DIAGNOSIS — H43819 Vitreous degeneration, unspecified eye: Secondary | ICD-10-CM

## 2011-09-05 DIAGNOSIS — H348392 Tributary (branch) retinal vein occlusion, unspecified eye, stable: Secondary | ICD-10-CM | POA: Diagnosis not present

## 2011-09-05 DIAGNOSIS — E1165 Type 2 diabetes mellitus with hyperglycemia: Secondary | ICD-10-CM

## 2011-09-05 DIAGNOSIS — E11319 Type 2 diabetes mellitus with unspecified diabetic retinopathy without macular edema: Secondary | ICD-10-CM

## 2011-09-05 DIAGNOSIS — H35039 Hypertensive retinopathy, unspecified eye: Secondary | ICD-10-CM | POA: Diagnosis not present

## 2011-09-05 DIAGNOSIS — H3530 Unspecified macular degeneration: Secondary | ICD-10-CM

## 2011-09-05 DIAGNOSIS — I10 Essential (primary) hypertension: Secondary | ICD-10-CM

## 2011-09-11 DIAGNOSIS — IMO0002 Reserved for concepts with insufficient information to code with codable children: Secondary | ICD-10-CM | POA: Diagnosis not present

## 2011-09-11 DIAGNOSIS — M5412 Radiculopathy, cervical region: Secondary | ICD-10-CM | POA: Diagnosis not present

## 2011-09-11 DIAGNOSIS — M999 Biomechanical lesion, unspecified: Secondary | ICD-10-CM | POA: Diagnosis not present

## 2011-09-11 DIAGNOSIS — M9981 Other biomechanical lesions of cervical region: Secondary | ICD-10-CM | POA: Diagnosis not present

## 2011-09-18 DIAGNOSIS — L819 Disorder of pigmentation, unspecified: Secondary | ICD-10-CM | POA: Diagnosis not present

## 2011-09-18 DIAGNOSIS — L820 Inflamed seborrheic keratosis: Secondary | ICD-10-CM | POA: Diagnosis not present

## 2011-09-18 DIAGNOSIS — D1739 Benign lipomatous neoplasm of skin and subcutaneous tissue of other sites: Secondary | ICD-10-CM | POA: Diagnosis not present

## 2011-10-02 DIAGNOSIS — M9981 Other biomechanical lesions of cervical region: Secondary | ICD-10-CM | POA: Diagnosis not present

## 2011-10-02 DIAGNOSIS — M5412 Radiculopathy, cervical region: Secondary | ICD-10-CM | POA: Diagnosis not present

## 2011-10-02 DIAGNOSIS — M999 Biomechanical lesion, unspecified: Secondary | ICD-10-CM | POA: Diagnosis not present

## 2011-10-02 DIAGNOSIS — IMO0002 Reserved for concepts with insufficient information to code with codable children: Secondary | ICD-10-CM | POA: Diagnosis not present

## 2011-10-03 DIAGNOSIS — Z23 Encounter for immunization: Secondary | ICD-10-CM | POA: Diagnosis not present

## 2011-10-20 DIAGNOSIS — R5383 Other fatigue: Secondary | ICD-10-CM | POA: Diagnosis not present

## 2011-10-20 DIAGNOSIS — Z79899 Other long term (current) drug therapy: Secondary | ICD-10-CM | POA: Diagnosis not present

## 2011-10-20 DIAGNOSIS — E1169 Type 2 diabetes mellitus with other specified complication: Secondary | ICD-10-CM | POA: Diagnosis not present

## 2011-10-20 DIAGNOSIS — E161 Other hypoglycemia: Secondary | ICD-10-CM | POA: Diagnosis not present

## 2011-10-20 DIAGNOSIS — R6889 Other general symptoms and signs: Secondary | ICD-10-CM | POA: Diagnosis not present

## 2011-10-20 DIAGNOSIS — I119 Hypertensive heart disease without heart failure: Secondary | ICD-10-CM | POA: Diagnosis not present

## 2011-10-20 DIAGNOSIS — R5381 Other malaise: Secondary | ICD-10-CM | POA: Diagnosis not present

## 2011-10-24 DIAGNOSIS — E119 Type 2 diabetes mellitus without complications: Secondary | ICD-10-CM | POA: Diagnosis not present

## 2011-10-24 DIAGNOSIS — E7800 Pure hypercholesterolemia, unspecified: Secondary | ICD-10-CM | POA: Diagnosis not present

## 2011-10-24 DIAGNOSIS — K589 Irritable bowel syndrome without diarrhea: Secondary | ICD-10-CM | POA: Diagnosis not present

## 2011-10-30 DIAGNOSIS — M999 Biomechanical lesion, unspecified: Secondary | ICD-10-CM | POA: Diagnosis not present

## 2011-10-30 DIAGNOSIS — M9981 Other biomechanical lesions of cervical region: Secondary | ICD-10-CM | POA: Diagnosis not present

## 2011-10-30 DIAGNOSIS — IMO0002 Reserved for concepts with insufficient information to code with codable children: Secondary | ICD-10-CM | POA: Diagnosis not present

## 2011-10-30 DIAGNOSIS — M5412 Radiculopathy, cervical region: Secondary | ICD-10-CM | POA: Diagnosis not present

## 2011-11-06 DIAGNOSIS — J329 Chronic sinusitis, unspecified: Secondary | ICD-10-CM | POA: Diagnosis not present

## 2011-11-23 DIAGNOSIS — H4011X0 Primary open-angle glaucoma, stage unspecified: Secondary | ICD-10-CM | POA: Diagnosis not present

## 2011-11-23 DIAGNOSIS — H409 Unspecified glaucoma: Secondary | ICD-10-CM | POA: Diagnosis not present

## 2011-11-23 DIAGNOSIS — Z961 Presence of intraocular lens: Secondary | ICD-10-CM | POA: Diagnosis not present

## 2011-11-27 DIAGNOSIS — M5412 Radiculopathy, cervical region: Secondary | ICD-10-CM | POA: Diagnosis not present

## 2011-11-27 DIAGNOSIS — IMO0002 Reserved for concepts with insufficient information to code with codable children: Secondary | ICD-10-CM | POA: Diagnosis not present

## 2011-11-27 DIAGNOSIS — M9981 Other biomechanical lesions of cervical region: Secondary | ICD-10-CM | POA: Diagnosis not present

## 2011-11-27 DIAGNOSIS — M999 Biomechanical lesion, unspecified: Secondary | ICD-10-CM | POA: Diagnosis not present

## 2011-12-03 ENCOUNTER — Ambulatory Visit: Payer: Medicare Other | Admitting: Cardiology

## 2011-12-18 DIAGNOSIS — M999 Biomechanical lesion, unspecified: Secondary | ICD-10-CM | POA: Diagnosis not present

## 2011-12-18 DIAGNOSIS — M9981 Other biomechanical lesions of cervical region: Secondary | ICD-10-CM | POA: Diagnosis not present

## 2011-12-18 DIAGNOSIS — M5412 Radiculopathy, cervical region: Secondary | ICD-10-CM | POA: Diagnosis not present

## 2011-12-18 DIAGNOSIS — IMO0002 Reserved for concepts with insufficient information to code with codable children: Secondary | ICD-10-CM | POA: Diagnosis not present

## 2012-01-04 ENCOUNTER — Ambulatory Visit (INDEPENDENT_AMBULATORY_CARE_PROVIDER_SITE_OTHER): Payer: Medicare Other | Admitting: Ophthalmology

## 2012-01-04 DIAGNOSIS — H3530 Unspecified macular degeneration: Secondary | ICD-10-CM

## 2012-01-04 DIAGNOSIS — H4010X0 Unspecified open-angle glaucoma, stage unspecified: Secondary | ICD-10-CM

## 2012-01-04 DIAGNOSIS — H348392 Tributary (branch) retinal vein occlusion, unspecified eye, stable: Secondary | ICD-10-CM

## 2012-01-04 DIAGNOSIS — I10 Essential (primary) hypertension: Secondary | ICD-10-CM

## 2012-01-04 DIAGNOSIS — H35039 Hypertensive retinopathy, unspecified eye: Secondary | ICD-10-CM

## 2012-01-04 DIAGNOSIS — H43819 Vitreous degeneration, unspecified eye: Secondary | ICD-10-CM

## 2012-01-22 ENCOUNTER — Encounter: Payer: Self-pay | Admitting: Cardiology

## 2012-01-22 ENCOUNTER — Ambulatory Visit (INDEPENDENT_AMBULATORY_CARE_PROVIDER_SITE_OTHER): Payer: Medicare Other | Admitting: Cardiology

## 2012-01-22 VITALS — BP 135/65 | HR 62 | Ht 60.0 in | Wt 153.1 lb

## 2012-01-22 DIAGNOSIS — M9981 Other biomechanical lesions of cervical region: Secondary | ICD-10-CM | POA: Diagnosis not present

## 2012-01-22 DIAGNOSIS — IMO0002 Reserved for concepts with insufficient information to code with codable children: Secondary | ICD-10-CM | POA: Diagnosis not present

## 2012-01-22 DIAGNOSIS — M5412 Radiculopathy, cervical region: Secondary | ICD-10-CM | POA: Diagnosis not present

## 2012-01-22 DIAGNOSIS — I509 Heart failure, unspecified: Secondary | ICD-10-CM

## 2012-01-22 DIAGNOSIS — M999 Biomechanical lesion, unspecified: Secondary | ICD-10-CM | POA: Diagnosis not present

## 2012-01-22 DIAGNOSIS — I10 Essential (primary) hypertension: Secondary | ICD-10-CM | POA: Diagnosis not present

## 2012-01-22 DIAGNOSIS — I2510 Atherosclerotic heart disease of native coronary artery without angina pectoris: Secondary | ICD-10-CM | POA: Diagnosis not present

## 2012-01-22 NOTE — Progress Notes (Signed)
HPI The patient presents for follow up of congestive heart failure with a preserved ejection fraction. This was particularly problematic when she was taking Actos. Since I last saw her she did have problems with pinched nerve in her left arm, macular degeneration and glaucoma, and hypoglycemia which required a brief ER visit. However, she's had no problems with volume overload. She's had no acute heart failure symptoms. She denies any chest pressure, neck or arm discomfort. She's not had any new palpitations, presyncope or syncope. She's had no weight gain or edema.  Allergies  Allergen Reactions  . Actos (Pioglitazone Hydrochloride)   . Clonidine Derivatives   . Combigan (Brimonidine Tartrate-Timolol)   . Dorzolamide   . Felodipine Er   . Hctz (Hydrochlorothiazide)   . Lisinopril   . Monopril (Fosinopril Sodium)   . Norvasc (Amlodipine Besylate)   . Penicillins   . Plendil (Felodipine)     Current Outpatient Prescriptions  Medication Sig Dispense Refill  . acetaminophen (TYLENOL) 500 MG tablet Take 500 mg by mouth 2 (two) times daily.      [name] amitriptyline (ELAVIL) 25 MG tablet Take 1 tablet by mouth daily.      [name] aspirin 81 MG tablet Take 81 mg by mouth daily.        [name] atorvastatin (LIPITOR) 10 MG tablet Take 10 mg by mouth daily.      . brinzolamide (AZOPT) 1 % ophthalmic suspension 1 drop 2 (two) times daily.      . Calcium Carbonate-Vitamin D (CALCIUM-VITAMIN D) 500-200 MG-UNIT per tablet Take 1 tablet by mouth 2 (two) times daily with a meal.      . carvedilol (COREG) 12.5 MG tablet Take 1 tablet (12.5 mg total) by mouth 2 (two) times daily with a meal.  60 tablet  6  . dicyclomine (BENTYL) 10 MG capsule Take 10 mg by mouth 4 (four) times daily -  before meals and at bedtime.        . fish oil-omega-3 fatty acids 1000 MG capsule Take 2 g by mouth daily.      . furosemide (LASIX) 40 MG tablet Take 1 tablet by mouth daily.      [name] glipiZIDE (GLUCOTROL) 5 MG tablet Take 0.5 tablets  by mouth Twice daily.      . hydrALAZINE (APRESOLINE) 50 MG tablet Take 0.5 tablets (25 mg total) by mouth 2 (two) times daily.  60 tablet  3  . LANTUS SOLOSTAR 100 UNIT/ML injection as directed.      [name] losartan (COZAAR) 100 MG tablet Take 1 tablet by mouth daily.      . Multiple Vitamin (MULTIVITAMIN) tablet Take 1 tablet by mouth daily.      [name] omeprazole (PRILOSEC OTC) 20 MG tablet Take 20 mg by mouth daily.        . timolol (TIMOPTIC) 0.5 % ophthalmic solution       . vitamin B-12 (CYANOCOBALAMIN) 100 MCG tablet Take 100 mcg by mouth daily.        Past Medical History  Diagnosis Date  . DM (diabetes mellitus)   . Dyslipidemia   . Unspecified hypertensive heart disease without heart failure   . CHF (congestive heart failure)     Diastolic. Precipitated by Actos  . Coronary artery disease 02/2010    abnormal nuclear stress test with mild ischemia in apical anterolateral wall.   . Hypertension   . Chronic kidney disease     mild-moderate. GFR was 35 in 02/12  Past Surgical History  Procedure Date  . Tonsillectomy   . Hysterectomy - unknown type   . Cholecystectomy   . Cataract extraction, bilateral   . Neck mass excision     non cancerous    ROS:  As stated in the HPI and negative for all other systems.  PHYSICAL EXAM BP 135/65  Pulse 62  Ht 5' (1.524 m)  Wt 153 lb 1.9 oz (69.455 kg)  BMI 29.90 kg/m2 GENERAL:  Well appearing NECK:  No jugular venous distention, waveform within normal limits, carotid upstroke brisk and symmetric, no bruits, no thyromegaly LUNGS:  Clear to auscultation bilaterally BACK:  No CVA tenderness CHEST:  Unremarkable HEART:  PMI not displaced or sustained,S1 and S2 within normal limits, no S3, no S4, no clicks, no rubs, no murmurs ABD:  Flat, positive bowel sounds normal in frequency in pitch, no bruits, no rebound, no guarding, no midline pulsatile mass, no hepatomegaly, no splenomegaly EXT:  2 plus pulses throughout, no edema, no cyanosis  no clubbing  EKG: Sinus rhythm, rate 62, lethargy hypertrophy, left axis deviation, repolarization changes.  01/22/2012  ASSESSMENT AND PLAN  CHF (congestive heart failure) - The patient has had no overt heart failure symptoms. We discussed again salt and fluid restriction. No change in therapy is indicated.  Coronary artery disease -  She did have a stress perfusion study in the past with very mild ischemia in the apical distribution. However, this was felt to be quite low risk. She's not having any current symptoms. No change in therapy or further imaging is indicated.  Hypertension -  I reviewed her blood pressure diary and her systolics are controlled in the morning. I asked her to give me some afternoon and evening readings to make sure that her basal blood pressure is not running higher than we know. For now she will continue the meds as listed.

## 2012-01-22 NOTE — Patient Instructions (Addendum)
The current medical regimen is effective;  continue present plan and medications.  Follow up in 1 year with Dr Hochrein.  You will receive a letter in the mail 2 months before you are due.  Please call us when you receive this letter to schedule your follow up appointment.  

## 2012-02-04 DIAGNOSIS — I10 Essential (primary) hypertension: Secondary | ICD-10-CM | POA: Diagnosis not present

## 2012-02-04 DIAGNOSIS — E7800 Pure hypercholesterolemia, unspecified: Secondary | ICD-10-CM | POA: Diagnosis not present

## 2012-02-04 DIAGNOSIS — E119 Type 2 diabetes mellitus without complications: Secondary | ICD-10-CM | POA: Diagnosis not present

## 2012-02-04 DIAGNOSIS — Z79899 Other long term (current) drug therapy: Secondary | ICD-10-CM | POA: Diagnosis not present

## 2012-02-25 DIAGNOSIS — M5412 Radiculopathy, cervical region: Secondary | ICD-10-CM | POA: Diagnosis not present

## 2012-02-25 DIAGNOSIS — M999 Biomechanical lesion, unspecified: Secondary | ICD-10-CM | POA: Diagnosis not present

## 2012-02-25 DIAGNOSIS — M9981 Other biomechanical lesions of cervical region: Secondary | ICD-10-CM | POA: Diagnosis not present

## 2012-02-25 DIAGNOSIS — IMO0002 Reserved for concepts with insufficient information to code with codable children: Secondary | ICD-10-CM | POA: Diagnosis not present

## 2012-03-10 DIAGNOSIS — M999 Biomechanical lesion, unspecified: Secondary | ICD-10-CM | POA: Diagnosis not present

## 2012-03-24 DIAGNOSIS — IMO0002 Reserved for concepts with insufficient information to code with codable children: Secondary | ICD-10-CM | POA: Diagnosis not present

## 2012-04-14 DIAGNOSIS — IMO0002 Reserved for concepts with insufficient information to code with codable children: Secondary | ICD-10-CM | POA: Diagnosis not present

## 2012-04-14 DIAGNOSIS — M5412 Radiculopathy, cervical region: Secondary | ICD-10-CM | POA: Diagnosis not present

## 2012-04-14 DIAGNOSIS — M999 Biomechanical lesion, unspecified: Secondary | ICD-10-CM | POA: Diagnosis not present

## 2012-04-14 DIAGNOSIS — M9981 Other biomechanical lesions of cervical region: Secondary | ICD-10-CM | POA: Diagnosis not present

## 2012-05-06 DIAGNOSIS — M9981 Other biomechanical lesions of cervical region: Secondary | ICD-10-CM | POA: Diagnosis not present

## 2012-05-06 DIAGNOSIS — IMO0002 Reserved for concepts with insufficient information to code with codable children: Secondary | ICD-10-CM | POA: Diagnosis not present

## 2012-05-06 DIAGNOSIS — M999 Biomechanical lesion, unspecified: Secondary | ICD-10-CM | POA: Diagnosis not present

## 2012-05-06 DIAGNOSIS — M5412 Radiculopathy, cervical region: Secondary | ICD-10-CM | POA: Diagnosis not present

## 2012-05-20 DIAGNOSIS — M999 Biomechanical lesion, unspecified: Secondary | ICD-10-CM | POA: Diagnosis not present

## 2012-05-20 DIAGNOSIS — M9981 Other biomechanical lesions of cervical region: Secondary | ICD-10-CM | POA: Diagnosis not present

## 2012-05-20 DIAGNOSIS — IMO0002 Reserved for concepts with insufficient information to code with codable children: Secondary | ICD-10-CM | POA: Diagnosis not present

## 2012-05-20 DIAGNOSIS — M5412 Radiculopathy, cervical region: Secondary | ICD-10-CM | POA: Diagnosis not present

## 2012-05-28 DIAGNOSIS — H4011X0 Primary open-angle glaucoma, stage unspecified: Secondary | ICD-10-CM | POA: Diagnosis not present

## 2012-05-28 DIAGNOSIS — H348392 Tributary (branch) retinal vein occlusion, unspecified eye, stable: Secondary | ICD-10-CM | POA: Diagnosis not present

## 2012-06-03 DIAGNOSIS — IMO0002 Reserved for concepts with insufficient information to code with codable children: Secondary | ICD-10-CM | POA: Diagnosis not present

## 2012-06-03 DIAGNOSIS — M9981 Other biomechanical lesions of cervical region: Secondary | ICD-10-CM | POA: Diagnosis not present

## 2012-06-03 DIAGNOSIS — M5412 Radiculopathy, cervical region: Secondary | ICD-10-CM | POA: Diagnosis not present

## 2012-06-03 DIAGNOSIS — M999 Biomechanical lesion, unspecified: Secondary | ICD-10-CM | POA: Diagnosis not present

## 2012-06-17 DIAGNOSIS — M999 Biomechanical lesion, unspecified: Secondary | ICD-10-CM | POA: Diagnosis not present

## 2012-06-17 DIAGNOSIS — M9981 Other biomechanical lesions of cervical region: Secondary | ICD-10-CM | POA: Diagnosis not present

## 2012-06-17 DIAGNOSIS — M5412 Radiculopathy, cervical region: Secondary | ICD-10-CM | POA: Diagnosis not present

## 2012-06-17 DIAGNOSIS — IMO0002 Reserved for concepts with insufficient information to code with codable children: Secondary | ICD-10-CM | POA: Diagnosis not present

## 2012-07-08 DIAGNOSIS — Z79899 Other long term (current) drug therapy: Secondary | ICD-10-CM | POA: Diagnosis not present

## 2012-07-08 DIAGNOSIS — K589 Irritable bowel syndrome without diarrhea: Secondary | ICD-10-CM | POA: Diagnosis not present

## 2012-07-08 DIAGNOSIS — E782 Mixed hyperlipidemia: Secondary | ICD-10-CM | POA: Diagnosis not present

## 2012-07-08 DIAGNOSIS — Z006 Encounter for examination for normal comparison and control in clinical research program: Secondary | ICD-10-CM | POA: Diagnosis not present

## 2012-07-08 DIAGNOSIS — I10 Essential (primary) hypertension: Secondary | ICD-10-CM | POA: Diagnosis not present

## 2012-07-08 DIAGNOSIS — E7800 Pure hypercholesterolemia, unspecified: Secondary | ICD-10-CM | POA: Diagnosis not present

## 2012-07-15 DIAGNOSIS — M999 Biomechanical lesion, unspecified: Secondary | ICD-10-CM | POA: Diagnosis not present

## 2012-07-15 DIAGNOSIS — IMO0002 Reserved for concepts with insufficient information to code with codable children: Secondary | ICD-10-CM | POA: Diagnosis not present

## 2012-07-15 DIAGNOSIS — M9981 Other biomechanical lesions of cervical region: Secondary | ICD-10-CM | POA: Diagnosis not present

## 2012-07-15 DIAGNOSIS — M5412 Radiculopathy, cervical region: Secondary | ICD-10-CM | POA: Diagnosis not present

## 2012-08-05 DIAGNOSIS — L0291 Cutaneous abscess, unspecified: Secondary | ICD-10-CM | POA: Diagnosis not present

## 2012-08-05 DIAGNOSIS — M999 Biomechanical lesion, unspecified: Secondary | ICD-10-CM | POA: Diagnosis not present

## 2012-08-05 DIAGNOSIS — IMO0002 Reserved for concepts with insufficient information to code with codable children: Secondary | ICD-10-CM | POA: Diagnosis not present

## 2012-08-05 DIAGNOSIS — M9981 Other biomechanical lesions of cervical region: Secondary | ICD-10-CM | POA: Diagnosis not present

## 2012-08-05 DIAGNOSIS — L0390 Cellulitis, unspecified: Secondary | ICD-10-CM | POA: Diagnosis not present

## 2012-08-05 DIAGNOSIS — M5412 Radiculopathy, cervical region: Secondary | ICD-10-CM | POA: Diagnosis not present

## 2012-09-02 DIAGNOSIS — M9981 Other biomechanical lesions of cervical region: Secondary | ICD-10-CM | POA: Diagnosis not present

## 2012-09-02 DIAGNOSIS — IMO0002 Reserved for concepts with insufficient information to code with codable children: Secondary | ICD-10-CM | POA: Diagnosis not present

## 2012-09-02 DIAGNOSIS — M999 Biomechanical lesion, unspecified: Secondary | ICD-10-CM | POA: Diagnosis not present

## 2012-09-02 DIAGNOSIS — M5412 Radiculopathy, cervical region: Secondary | ICD-10-CM | POA: Diagnosis not present

## 2012-09-23 DIAGNOSIS — IMO0001 Reserved for inherently not codable concepts without codable children: Secondary | ICD-10-CM | POA: Diagnosis not present

## 2012-09-23 DIAGNOSIS — M9981 Other biomechanical lesions of cervical region: Secondary | ICD-10-CM | POA: Diagnosis not present

## 2012-09-23 DIAGNOSIS — M999 Biomechanical lesion, unspecified: Secondary | ICD-10-CM | POA: Diagnosis not present

## 2012-09-23 DIAGNOSIS — M2390 Unspecified internal derangement of unspecified knee: Secondary | ICD-10-CM | POA: Diagnosis not present

## 2012-09-23 DIAGNOSIS — IMO0002 Reserved for concepts with insufficient information to code with codable children: Secondary | ICD-10-CM | POA: Diagnosis not present

## 2012-09-23 DIAGNOSIS — M5412 Radiculopathy, cervical region: Secondary | ICD-10-CM | POA: Diagnosis not present

## 2012-10-06 DIAGNOSIS — M9981 Other biomechanical lesions of cervical region: Secondary | ICD-10-CM | POA: Diagnosis not present

## 2012-10-06 DIAGNOSIS — IMO0002 Reserved for concepts with insufficient information to code with codable children: Secondary | ICD-10-CM | POA: Diagnosis not present

## 2012-10-06 DIAGNOSIS — IMO0001 Reserved for inherently not codable concepts without codable children: Secondary | ICD-10-CM | POA: Diagnosis not present

## 2012-10-06 DIAGNOSIS — M2390 Unspecified internal derangement of unspecified knee: Secondary | ICD-10-CM | POA: Diagnosis not present

## 2012-10-06 DIAGNOSIS — M5412 Radiculopathy, cervical region: Secondary | ICD-10-CM | POA: Diagnosis not present

## 2012-10-06 DIAGNOSIS — M999 Biomechanical lesion, unspecified: Secondary | ICD-10-CM | POA: Diagnosis not present

## 2012-10-14 DIAGNOSIS — M2390 Unspecified internal derangement of unspecified knee: Secondary | ICD-10-CM | POA: Diagnosis not present

## 2012-10-14 DIAGNOSIS — M9981 Other biomechanical lesions of cervical region: Secondary | ICD-10-CM | POA: Diagnosis not present

## 2012-10-14 DIAGNOSIS — M5412 Radiculopathy, cervical region: Secondary | ICD-10-CM | POA: Diagnosis not present

## 2012-10-14 DIAGNOSIS — M999 Biomechanical lesion, unspecified: Secondary | ICD-10-CM | POA: Diagnosis not present

## 2012-10-14 DIAGNOSIS — IMO0002 Reserved for concepts with insufficient information to code with codable children: Secondary | ICD-10-CM | POA: Diagnosis not present

## 2012-10-14 DIAGNOSIS — IMO0001 Reserved for inherently not codable concepts without codable children: Secondary | ICD-10-CM | POA: Diagnosis not present

## 2012-11-05 DIAGNOSIS — Z23 Encounter for immunization: Secondary | ICD-10-CM | POA: Diagnosis not present

## 2012-11-11 DIAGNOSIS — M5412 Radiculopathy, cervical region: Secondary | ICD-10-CM | POA: Diagnosis not present

## 2012-11-11 DIAGNOSIS — IMO0001 Reserved for inherently not codable concepts without codable children: Secondary | ICD-10-CM | POA: Diagnosis not present

## 2012-11-11 DIAGNOSIS — M9981 Other biomechanical lesions of cervical region: Secondary | ICD-10-CM | POA: Diagnosis not present

## 2012-11-11 DIAGNOSIS — M999 Biomechanical lesion, unspecified: Secondary | ICD-10-CM | POA: Diagnosis not present

## 2012-11-11 DIAGNOSIS — IMO0002 Reserved for concepts with insufficient information to code with codable children: Secondary | ICD-10-CM | POA: Diagnosis not present

## 2012-12-16 DIAGNOSIS — IMO0001 Reserved for inherently not codable concepts without codable children: Secondary | ICD-10-CM | POA: Diagnosis not present

## 2012-12-16 DIAGNOSIS — M999 Biomechanical lesion, unspecified: Secondary | ICD-10-CM | POA: Diagnosis not present

## 2012-12-16 DIAGNOSIS — IMO0002 Reserved for concepts with insufficient information to code with codable children: Secondary | ICD-10-CM | POA: Diagnosis not present

## 2012-12-16 DIAGNOSIS — M9981 Other biomechanical lesions of cervical region: Secondary | ICD-10-CM | POA: Diagnosis not present

## 2012-12-16 DIAGNOSIS — M5412 Radiculopathy, cervical region: Secondary | ICD-10-CM | POA: Diagnosis not present

## 2013-01-05 DIAGNOSIS — M999 Biomechanical lesion, unspecified: Secondary | ICD-10-CM | POA: Diagnosis not present

## 2013-01-05 DIAGNOSIS — M9981 Other biomechanical lesions of cervical region: Secondary | ICD-10-CM | POA: Diagnosis not present

## 2013-01-05 DIAGNOSIS — M5412 Radiculopathy, cervical region: Secondary | ICD-10-CM | POA: Diagnosis not present

## 2013-01-05 DIAGNOSIS — IMO0001 Reserved for inherently not codable concepts without codable children: Secondary | ICD-10-CM | POA: Diagnosis not present

## 2013-01-05 DIAGNOSIS — IMO0002 Reserved for concepts with insufficient information to code with codable children: Secondary | ICD-10-CM | POA: Diagnosis not present

## 2013-01-22 ENCOUNTER — Ambulatory Visit (INDEPENDENT_AMBULATORY_CARE_PROVIDER_SITE_OTHER): Payer: Medicare Other | Admitting: Cardiology

## 2013-01-22 ENCOUNTER — Telehealth: Payer: Self-pay | Admitting: Cardiology

## 2013-01-22 ENCOUNTER — Encounter: Payer: Self-pay | Admitting: Cardiology

## 2013-01-22 VITALS — BP 140/60 | HR 60 | Ht 60.0 in | Wt 152.0 lb

## 2013-01-22 DIAGNOSIS — I2510 Atherosclerotic heart disease of native coronary artery without angina pectoris: Secondary | ICD-10-CM

## 2013-01-22 DIAGNOSIS — I10 Essential (primary) hypertension: Secondary | ICD-10-CM

## 2013-01-22 DIAGNOSIS — I509 Heart failure, unspecified: Secondary | ICD-10-CM

## 2013-01-22 MED ORDER — ISOSORBIDE MONONITRATE ER 30 MG PO TB24: 30.0000 mg | ORAL_TABLET | Freq: Every day | ORAL | Status: DC

## 2013-01-22 NOTE — Telephone Encounter (Signed)
ROI faxed to Dr.Dhatt Office at [phone]

## 2013-01-22 NOTE — Patient Instructions (Signed)
Please start Isosorbide 30 mg daily. Continue all other medications as listed.  Follow up in 3 months.

## 2013-01-22 NOTE — Telephone Encounter (Signed)
Stress Test Received From Dr.Dhatt office gave to [facility]

## 2013-01-22 NOTE — Progress Notes (Signed)
HPI The patient presents for follow up of congestive heart failure with a preserved ejection fraction. She presents for yearly followup. Her only complaint from a cardiovascular standpoint is she's getting progressive shortness of breath sometimes having to stop when she climbs the stairs. She's not describin aneck or arm discomfort although she has a little chest discomfort when she is short of breath. She stopped sometimes in the middle of the stairs or at the top recover after several minutes. She does not describe resting shortness of breath, PND or orthopnea. She's not describing palpitations, presyncope or syncope. There's not been any significant edema.  Allergies  Allergen Reactions  . Actos [Pioglitazone Hydrochloride]   . Clonidine Derivatives   . Combigan [Brimonidine Tartrate-Timolol]   . Dorzolamide   . Felodipine Er   . Hctz [Hydrochlorothiazide]   . Lisinopril   . Monopril [Fosinopril Sodium]   . Norvasc [Amlodipine Besylate]   . Penicillins   . Plendil [Felodipine]     Current Outpatient Prescriptions  Medication Sig Dispense Refill  . acetaminophen (TYLENOL) 500 MG tablet Take 500 mg by mouth 2 (two) times daily.      [name] amitriptyline (ELAVIL) 25 MG tablet Take 1 tablet by mouth daily.      [name] aspirin 81 MG tablet Take 81 mg by mouth daily.        [name] atorvastatin (LIPITOR) 10 MG tablet Take 10 mg by mouth daily.      . brinzolamide (AZOPT) 1 % ophthalmic suspension 1 drop 2 (two) times daily.      . Calcium Carbonate-Vitamin D (CALCIUM-VITAMIN D) 500-200 MG-UNIT per tablet Take 1 tablet by mouth 2 (two) times daily with a meal.      . carvedilol (COREG) 12.5 MG tablet Take 1 tablet (12.5 mg total) by mouth 2 (two) times daily with a meal.  60 tablet  6  . dicyclomine (BENTYL) 10 MG capsule Take 10 mg by mouth 4 (four) times daily -  before meals and at bedtime.        . fish oil-omega-3 fatty acids 1000 MG capsule Take 2 g by mouth daily.      . furosemide (LASIX) 40  MG tablet Take 1 tablet by mouth daily.      . hydrALAZINE (APRESOLINE) 50 MG tablet Take 0.5 tablets (25 mg total) by mouth 2 (two) times daily.  60 tablet  3  . LANTUS SOLOSTAR 100 UNIT/ML injection as directed.      . latanoprost (XALATAN) 0.005 % ophthalmic solution       . losartan (COZAAR) 100 MG tablet Take 1 tablet by mouth daily.      . Multiple Vitamin (MULTIVITAMIN) tablet Take 1 tablet by mouth daily.      [name] omeprazole (PRILOSEC OTC) 20 MG tablet Take 20 mg by mouth daily.        . timolol (TIMOPTIC) 0.5 % ophthalmic solution       . vitamin B-12 (CYANOCOBALAMIN) 100 MCG tablet Take 100 mcg by mouth daily.       No current facility-administered medications for this visit.    Past Medical History  Diagnosis Date  . DM (diabetes mellitus)   . Dyslipidemia   . Unspecified hypertensive heart disease without heart failure   . CHF (congestive heart failure)     Diastolic. Precipitated by Actos  . Coronary artery disease 02/2010    abnormal nuclear stress test with mild ischemia in apical anterolateral wall.   . Hypertension   .  Chronic kidney disease     mild-moderate. GFR was 35 in 02/12    Past Surgical History  Procedure Laterality Date  . Tonsillectomy    . Hysterectomy - unknown type    . Cholecystectomy    . Cataract extraction, bilateral    . Neck mass excision      non cancerous    ROS:  As stated in the HPI and negative for all other systems.  PHYSICAL EXAM BP 140/60  Pulse 60  Ht 5' (1.524 m)  Wt 152 lb (68.947 kg)  BMI 29.69 kg/m2 GENERAL:  Well appearing NECK:  Positive 8 cm jugular venous distention, waveform within normal limits, carotid upstroke brisk and symmetric, no bruits, no thyromegaly LUNGS:  Clear to auscultation bilaterally BACK:  No CVA tenderness CHEST:  Unremarkable HEART:  PMI not displaced or sustained,S1 and S2 within normal limits, no S3, no S4, no clicks, no rubs, no murmurs ABD:  Flat, positive bowel sounds normal in frequency  in pitch, no bruits, no rebound, no guarding, no midline pulsatile mass, no hepatomegaly, no splenomegaly EXT:  2 plus pulses throughout, no edema, no cyanosis no clubbing  EKG: Sinus rhythm, rate 60, lethargy hypertrophy, left axis deviation, repolarization changes.  01/22/2013  ASSESSMENT AND PLAN  CHF (congestive heart failure) - He does have some JVD but otherwise doesn't appear to be volume overloaded and does not appear to have left-sided failure. I will consider a BNP and further management based on any future symptoms or lack of response to the therapy below.  Coronary artery disease -  She did have a stress perfusion study in the past with very mild ischemia in the apical distribution. Her current symptoms could be related to this. I'm going to try a low dose of Imdur 30 mg daily to see if the dyspnea with exertion helps.  Hypertension -  The blood pressure is at target. No change in medications is indicated. We will continue with therapeutic lifestyle changes (TLC).

## 2013-01-27 DIAGNOSIS — E7800 Pure hypercholesterolemia, unspecified: Secondary | ICD-10-CM | POA: Diagnosis not present

## 2013-01-27 DIAGNOSIS — I10 Essential (primary) hypertension: Secondary | ICD-10-CM | POA: Diagnosis not present

## 2013-01-27 DIAGNOSIS — E119 Type 2 diabetes mellitus without complications: Secondary | ICD-10-CM | POA: Diagnosis not present

## 2013-01-29 DIAGNOSIS — E119 Type 2 diabetes mellitus without complications: Secondary | ICD-10-CM | POA: Diagnosis not present

## 2013-01-29 DIAGNOSIS — H4011X0 Primary open-angle glaucoma, stage unspecified: Secondary | ICD-10-CM | POA: Diagnosis not present

## 2013-01-29 DIAGNOSIS — H348392 Tributary (branch) retinal vein occlusion, unspecified eye, stable: Secondary | ICD-10-CM | POA: Diagnosis not present

## 2013-01-29 DIAGNOSIS — Z961 Presence of intraocular lens: Secondary | ICD-10-CM | POA: Diagnosis not present

## 2013-02-03 DIAGNOSIS — M5412 Radiculopathy, cervical region: Secondary | ICD-10-CM | POA: Diagnosis not present

## 2013-02-03 DIAGNOSIS — M9981 Other biomechanical lesions of cervical region: Secondary | ICD-10-CM | POA: Diagnosis not present

## 2013-02-03 DIAGNOSIS — IMO0001 Reserved for inherently not codable concepts without codable children: Secondary | ICD-10-CM | POA: Diagnosis not present

## 2013-02-03 DIAGNOSIS — M999 Biomechanical lesion, unspecified: Secondary | ICD-10-CM | POA: Diagnosis not present

## 2013-02-03 DIAGNOSIS — IMO0002 Reserved for concepts with insufficient information to code with codable children: Secondary | ICD-10-CM | POA: Diagnosis not present

## 2013-02-24 DIAGNOSIS — J189 Pneumonia, unspecified organism: Secondary | ICD-10-CM | POA: Diagnosis not present

## 2013-02-25 DIAGNOSIS — J069 Acute upper respiratory infection, unspecified: Secondary | ICD-10-CM | POA: Diagnosis not present

## 2013-02-25 DIAGNOSIS — IMO0002 Reserved for concepts with insufficient information to code with codable children: Secondary | ICD-10-CM | POA: Diagnosis not present

## 2013-02-25 DIAGNOSIS — J40 Bronchitis, not specified as acute or chronic: Secondary | ICD-10-CM | POA: Diagnosis not present

## 2013-02-25 DIAGNOSIS — J209 Acute bronchitis, unspecified: Secondary | ICD-10-CM | POA: Diagnosis not present

## 2013-02-25 DIAGNOSIS — Z79899 Other long term (current) drug therapy: Secondary | ICD-10-CM | POA: Diagnosis not present

## 2013-02-25 DIAGNOSIS — I10 Essential (primary) hypertension: Secondary | ICD-10-CM | POA: Diagnosis not present

## 2013-02-25 DIAGNOSIS — R5383 Other fatigue: Secondary | ICD-10-CM | POA: Diagnosis not present

## 2013-02-25 DIAGNOSIS — R5381 Other malaise: Secondary | ICD-10-CM | POA: Diagnosis not present

## 2013-02-25 DIAGNOSIS — E1165 Type 2 diabetes mellitus with hyperglycemia: Secondary | ICD-10-CM | POA: Diagnosis not present

## 2013-02-25 DIAGNOSIS — Z794 Long term (current) use of insulin: Secondary | ICD-10-CM | POA: Diagnosis not present

## 2013-02-25 DIAGNOSIS — Z7982 Long term (current) use of aspirin: Secondary | ICD-10-CM | POA: Diagnosis not present

## 2013-02-25 DIAGNOSIS — K219 Gastro-esophageal reflux disease without esophagitis: Secondary | ICD-10-CM | POA: Diagnosis not present

## 2013-02-25 DIAGNOSIS — I951 Orthostatic hypotension: Secondary | ICD-10-CM | POA: Diagnosis not present

## 2013-02-25 DIAGNOSIS — E7800 Pure hypercholesterolemia, unspecified: Secondary | ICD-10-CM | POA: Diagnosis not present

## 2013-02-25 DIAGNOSIS — E860 Dehydration: Secondary | ICD-10-CM | POA: Diagnosis not present

## 2013-02-25 DIAGNOSIS — N179 Acute kidney failure, unspecified: Secondary | ICD-10-CM | POA: Diagnosis not present

## 2013-02-25 DIAGNOSIS — R030 Elevated blood-pressure reading, without diagnosis of hypertension: Secondary | ICD-10-CM | POA: Diagnosis not present

## 2013-02-25 DIAGNOSIS — D638 Anemia in other chronic diseases classified elsewhere: Secondary | ICD-10-CM | POA: Diagnosis not present

## 2013-02-25 DIAGNOSIS — R42 Dizziness and giddiness: Secondary | ICD-10-CM | POA: Diagnosis not present

## 2013-02-26 DIAGNOSIS — E860 Dehydration: Secondary | ICD-10-CM | POA: Diagnosis not present

## 2013-02-26 DIAGNOSIS — R030 Elevated blood-pressure reading, without diagnosis of hypertension: Secondary | ICD-10-CM | POA: Diagnosis not present

## 2013-02-26 DIAGNOSIS — N179 Acute kidney failure, unspecified: Secondary | ICD-10-CM | POA: Diagnosis not present

## 2013-02-26 DIAGNOSIS — I951 Orthostatic hypotension: Secondary | ICD-10-CM | POA: Diagnosis not present

## 2013-02-27 DIAGNOSIS — R262 Difficulty in walking, not elsewhere classified: Secondary | ICD-10-CM | POA: Diagnosis not present

## 2013-02-27 DIAGNOSIS — E119 Type 2 diabetes mellitus without complications: Secondary | ICD-10-CM | POA: Diagnosis not present

## 2013-02-27 DIAGNOSIS — IMO0001 Reserved for inherently not codable concepts without codable children: Secondary | ICD-10-CM | POA: Diagnosis not present

## 2013-02-27 DIAGNOSIS — I10 Essential (primary) hypertension: Secondary | ICD-10-CM | POA: Diagnosis not present

## 2013-02-27 DIAGNOSIS — M6281 Muscle weakness (generalized): Secondary | ICD-10-CM | POA: Diagnosis not present

## 2013-03-04 DIAGNOSIS — I10 Essential (primary) hypertension: Secondary | ICD-10-CM | POA: Diagnosis not present

## 2013-03-04 DIAGNOSIS — E119 Type 2 diabetes mellitus without complications: Secondary | ICD-10-CM | POA: Diagnosis not present

## 2013-03-04 DIAGNOSIS — IMO0001 Reserved for inherently not codable concepts without codable children: Secondary | ICD-10-CM | POA: Diagnosis not present

## 2013-03-04 DIAGNOSIS — R262 Difficulty in walking, not elsewhere classified: Secondary | ICD-10-CM | POA: Diagnosis not present

## 2013-03-04 DIAGNOSIS — M6281 Muscle weakness (generalized): Secondary | ICD-10-CM | POA: Diagnosis not present

## 2013-03-05 DIAGNOSIS — J189 Pneumonia, unspecified organism: Secondary | ICD-10-CM | POA: Diagnosis not present

## 2013-03-06 DIAGNOSIS — I10 Essential (primary) hypertension: Secondary | ICD-10-CM | POA: Diagnosis not present

## 2013-03-06 DIAGNOSIS — E119 Type 2 diabetes mellitus without complications: Secondary | ICD-10-CM | POA: Diagnosis not present

## 2013-03-06 DIAGNOSIS — M6281 Muscle weakness (generalized): Secondary | ICD-10-CM | POA: Diagnosis not present

## 2013-03-06 DIAGNOSIS — IMO0001 Reserved for inherently not codable concepts without codable children: Secondary | ICD-10-CM | POA: Diagnosis not present

## 2013-03-06 DIAGNOSIS — R262 Difficulty in walking, not elsewhere classified: Secondary | ICD-10-CM | POA: Diagnosis not present

## 2013-03-09 DIAGNOSIS — M6281 Muscle weakness (generalized): Secondary | ICD-10-CM | POA: Diagnosis not present

## 2013-03-09 DIAGNOSIS — IMO0001 Reserved for inherently not codable concepts without codable children: Secondary | ICD-10-CM | POA: Diagnosis not present

## 2013-03-09 DIAGNOSIS — R262 Difficulty in walking, not elsewhere classified: Secondary | ICD-10-CM | POA: Diagnosis not present

## 2013-03-09 DIAGNOSIS — I10 Essential (primary) hypertension: Secondary | ICD-10-CM | POA: Diagnosis not present

## 2013-03-09 DIAGNOSIS — E119 Type 2 diabetes mellitus without complications: Secondary | ICD-10-CM | POA: Diagnosis not present

## 2013-03-14 DIAGNOSIS — R262 Difficulty in walking, not elsewhere classified: Secondary | ICD-10-CM | POA: Diagnosis not present

## 2013-03-14 DIAGNOSIS — I10 Essential (primary) hypertension: Secondary | ICD-10-CM | POA: Diagnosis not present

## 2013-03-14 DIAGNOSIS — E119 Type 2 diabetes mellitus without complications: Secondary | ICD-10-CM | POA: Diagnosis not present

## 2013-03-14 DIAGNOSIS — M6281 Muscle weakness (generalized): Secondary | ICD-10-CM | POA: Diagnosis not present

## 2013-03-14 DIAGNOSIS — IMO0001 Reserved for inherently not codable concepts without codable children: Secondary | ICD-10-CM | POA: Diagnosis not present

## 2013-03-17 DIAGNOSIS — R262 Difficulty in walking, not elsewhere classified: Secondary | ICD-10-CM | POA: Diagnosis not present

## 2013-03-17 DIAGNOSIS — I10 Essential (primary) hypertension: Secondary | ICD-10-CM | POA: Diagnosis not present

## 2013-03-17 DIAGNOSIS — M6281 Muscle weakness (generalized): Secondary | ICD-10-CM | POA: Diagnosis not present

## 2013-03-17 DIAGNOSIS — E119 Type 2 diabetes mellitus without complications: Secondary | ICD-10-CM | POA: Diagnosis not present

## 2013-03-17 DIAGNOSIS — IMO0001 Reserved for inherently not codable concepts without codable children: Secondary | ICD-10-CM | POA: Diagnosis not present

## 2013-03-19 DIAGNOSIS — IMO0001 Reserved for inherently not codable concepts without codable children: Secondary | ICD-10-CM | POA: Diagnosis not present

## 2013-03-19 DIAGNOSIS — M6281 Muscle weakness (generalized): Secondary | ICD-10-CM | POA: Diagnosis not present

## 2013-03-19 DIAGNOSIS — E119 Type 2 diabetes mellitus without complications: Secondary | ICD-10-CM | POA: Diagnosis not present

## 2013-03-19 DIAGNOSIS — I10 Essential (primary) hypertension: Secondary | ICD-10-CM | POA: Diagnosis not present

## 2013-03-19 DIAGNOSIS — R262 Difficulty in walking, not elsewhere classified: Secondary | ICD-10-CM | POA: Diagnosis not present

## 2013-03-25 DIAGNOSIS — M5412 Radiculopathy, cervical region: Secondary | ICD-10-CM | POA: Diagnosis not present

## 2013-03-25 DIAGNOSIS — M9981 Other biomechanical lesions of cervical region: Secondary | ICD-10-CM | POA: Diagnosis not present

## 2013-03-25 DIAGNOSIS — IMO0002 Reserved for concepts with insufficient information to code with codable children: Secondary | ICD-10-CM | POA: Diagnosis not present

## 2013-03-25 DIAGNOSIS — IMO0001 Reserved for inherently not codable concepts without codable children: Secondary | ICD-10-CM | POA: Diagnosis not present

## 2013-03-25 DIAGNOSIS — M999 Biomechanical lesion, unspecified: Secondary | ICD-10-CM | POA: Diagnosis not present

## 2013-04-02 DIAGNOSIS — R5383 Other fatigue: Secondary | ICD-10-CM | POA: Diagnosis not present

## 2013-04-02 DIAGNOSIS — R5381 Other malaise: Secondary | ICD-10-CM | POA: Diagnosis not present

## 2013-04-21 DIAGNOSIS — M5412 Radiculopathy, cervical region: Secondary | ICD-10-CM | POA: Diagnosis not present

## 2013-04-21 DIAGNOSIS — IMO0001 Reserved for inherently not codable concepts without codable children: Secondary | ICD-10-CM | POA: Diagnosis not present

## 2013-04-21 DIAGNOSIS — IMO0002 Reserved for concepts with insufficient information to code with codable children: Secondary | ICD-10-CM | POA: Diagnosis not present

## 2013-04-21 DIAGNOSIS — M9981 Other biomechanical lesions of cervical region: Secondary | ICD-10-CM | POA: Diagnosis not present

## 2013-04-21 DIAGNOSIS — M999 Biomechanical lesion, unspecified: Secondary | ICD-10-CM | POA: Diagnosis not present

## 2013-04-28 DIAGNOSIS — K589 Irritable bowel syndrome without diarrhea: Secondary | ICD-10-CM | POA: Diagnosis not present

## 2013-04-28 DIAGNOSIS — J329 Chronic sinusitis, unspecified: Secondary | ICD-10-CM | POA: Diagnosis not present

## 2013-05-05 DIAGNOSIS — J309 Allergic rhinitis, unspecified: Secondary | ICD-10-CM | POA: Diagnosis not present

## 2013-05-14 ENCOUNTER — Ambulatory Visit (INDEPENDENT_AMBULATORY_CARE_PROVIDER_SITE_OTHER): Payer: Medicare Other | Admitting: Cardiology

## 2013-05-14 ENCOUNTER — Encounter: Payer: Self-pay | Admitting: Cardiology

## 2013-05-14 VITALS — BP 185/78 | HR 61 | Ht 60.0 in | Wt 148.8 lb

## 2013-05-14 DIAGNOSIS — I2510 Atherosclerotic heart disease of native coronary artery without angina pectoris: Secondary | ICD-10-CM

## 2013-05-14 DIAGNOSIS — I509 Heart failure, unspecified: Secondary | ICD-10-CM | POA: Diagnosis not present

## 2013-05-14 DIAGNOSIS — I10 Essential (primary) hypertension: Secondary | ICD-10-CM

## 2013-05-14 NOTE — Patient Instructions (Signed)
The current medical regimen is effective;  continue present plan and medications.  Follow up in January 2016 with Dr Percival Spanish at the [facility] office.

## 2013-05-14 NOTE — Progress Notes (Signed)
HPI The patient presents for follow up of congestive heart failure with a preserved ejection fraction. She presents for yearly followup. Her only complaint from a cardiovascular standpoint is she's getting progressive shortness of breath sometimes having to stop when she climbs the stairs. She's not describin aneck or arm discomfort although she has a little chest discomfort when she is short of breath. She stopped sometimes in the middle of the stairs or at the top recover after several minutes. She does not describe resting shortness of breath, PND or orthopnea. She's not describing palpitations, presyncope or syncope. There's not been any significant edema.  Allergies  Allergen Reactions  . Actos [Pioglitazone Hydrochloride]   . Clonidine Derivatives   . Combigan [Brimonidine Tartrate-Timolol]   . Dorzolamide   . Felodipine Er   . Hctz [Hydrochlorothiazide]   . Lisinopril   . Monopril [Fosinopril Sodium]   . Norvasc [Amlodipine Besylate]   . Penicillins   . Plendil [Felodipine]     Current Outpatient Prescriptions  Medication Sig Dispense Refill  . acetaminophen (TYLENOL) 500 MG tablet Take 500 mg by mouth 2 (two) times daily.      [name] amitriptyline (ELAVIL) 25 MG tablet Take 1 tablet by mouth daily.      [name] aspirin 81 MG tablet Take 81 mg by mouth daily.        [name] atorvastatin (LIPITOR) 10 MG tablet Take 10 mg by mouth daily.      . brinzolamide (AZOPT) 1 % ophthalmic suspension 1 drop 2 (two) times daily.      . Calcium Carbonate-Vitamin D (CALCIUM-VITAMIN D) 500-200 MG-UNIT per tablet Take 1 tablet by mouth 2 (two) times daily with a meal.      . carvedilol (COREG) 12.5 MG tablet Take 1 tablet (12.5 mg total) by mouth 2 (two) times daily with a meal.  60 tablet  6  . cyanocobalamin 1000 MCG tablet Take 100 mcg by mouth daily.      [name] dicyclomine (BENTYL) 10 MG capsule Take 10 mg by mouth 4 (four) times daily -  before meals and at bedtime.        . fish oil-omega-3 fatty acids  1000 MG capsule Take 2 g by mouth daily.      . furosemide (LASIX) 40 MG tablet Take 1 tablet by mouth daily.      . isosorbide mononitrate (IMDUR) 30 MG 24 hr tablet Take 1 tablet (30 mg total) by mouth daily.  30 tablet  11  . LANTUS SOLOSTAR 100 UNIT/ML injection as directed.      . latanoprost (XALATAN) 0.005 % ophthalmic solution       . losartan (COZAAR) 100 MG tablet Take 1 tablet by mouth daily.      . Multiple Vitamin (MULTIVITAMIN) tablet Take 1 tablet by mouth daily.      . Multiple Vitamins-Minerals (EYE VITAMINS PO) Take by mouth 2 (two) times daily.      [name] omeprazole (PRILOSEC OTC) 20 MG tablet Take 20 mg by mouth daily.        . Probiotic Product (PROBIOTIC FORMULA) CAPS Take by mouth daily.      . timolol (TIMOPTIC) 0.5 % ophthalmic solution       . hydrALAZINE (APRESOLINE) 50 MG tablet Take 0.5 tablets (25 mg total) by mouth 2 (two) times daily.  60 tablet  3   No current facility-administered medications for this visit.    Past Medical History  Diagnosis Date  . DM (diabetes  mellitus)   . Dyslipidemia   . Unspecified hypertensive heart disease without heart failure   . CHF (congestive heart failure)     Diastolic. Precipitated by Actos  . Coronary artery disease 02/2010    abnormal nuclear stress test with mild ischemia in apical anterolateral wall.   . Hypertension   . Chronic kidney disease     mild-moderate. GFR was 35 in 02/12    Past Surgical History  Procedure Laterality Date  . Tonsillectomy    . Hysterectomy - unknown type    . Cholecystectomy    . Cataract extraction, bilateral    . Neck mass excision      non cancerous    ROS:  As stated in the HPI and negative for all other systems.  PHYSICAL EXAM BP 185/78  Pulse 61  Ht 5' (1.524 m)  Wt 148 lb 12.8 oz (67.495 kg)  BMI 29.06 kg/m2 GENERAL:  Well appearing NECK:  Positive 8 cm jugular venous distention, waveform within normal limits, carotid upstroke brisk and symmetric, no bruits, no  thyromegaly LUNGS:  Clear to auscultation bilaterally BACK:  No CVA tenderness CHEST:  Unremarkable HEART:  PMI not displaced or sustained,S1 and S2 within normal limits, no S3, no S4, no clicks, no rubs, no murmurs ABD:  Flat, positive bowel sounds normal in frequency in pitch, no bruits, no rebound, no guarding, no midline pulsatile mass, no hepatomegaly, no splenomegaly EXT:  2 plus pulses throughout, no edema, no cyanosis no clubbing  EKG: Sinus rhythm, rate 58, lethargy hypertrophy, left axis deviation, repolarization changes.  05/14/2013  ASSESSMENT AND PLAN  CHF (congestive heart failure) - He does have some JVD but otherwise doesn't appear to be volume overloaded and does not appear to have left-sided failure. I will consider a BNP and further management based on any future symptoms or lack of response to the therapy below.  Coronary artery disease -  She did have a stress perfusion study in the past with very mild ischemia in the apical distribution. Her current symptoms could be related to this. I'm going to try a low dose of Imdur 30 mg daily to see if the dyspnea with exertion helps.  Hypertension -  The blood pressure is at target. No change in medications is indicated. We will continue with therapeutic lifestyle changes (TLC).

## 2013-05-26 DIAGNOSIS — IMO0001 Reserved for inherently not codable concepts without codable children: Secondary | ICD-10-CM | POA: Diagnosis not present

## 2013-05-26 DIAGNOSIS — M5412 Radiculopathy, cervical region: Secondary | ICD-10-CM | POA: Diagnosis not present

## 2013-05-26 DIAGNOSIS — M999 Biomechanical lesion, unspecified: Secondary | ICD-10-CM | POA: Diagnosis not present

## 2013-05-26 DIAGNOSIS — IMO0002 Reserved for concepts with insufficient information to code with codable children: Secondary | ICD-10-CM | POA: Diagnosis not present

## 2013-05-26 DIAGNOSIS — M9981 Other biomechanical lesions of cervical region: Secondary | ICD-10-CM | POA: Diagnosis not present

## 2013-06-23 DIAGNOSIS — M999 Biomechanical lesion, unspecified: Secondary | ICD-10-CM | POA: Diagnosis not present

## 2013-06-23 DIAGNOSIS — IMO0001 Reserved for inherently not codable concepts without codable children: Secondary | ICD-10-CM | POA: Diagnosis not present

## 2013-06-23 DIAGNOSIS — IMO0002 Reserved for concepts with insufficient information to code with codable children: Secondary | ICD-10-CM | POA: Diagnosis not present

## 2013-06-23 DIAGNOSIS — M9981 Other biomechanical lesions of cervical region: Secondary | ICD-10-CM | POA: Diagnosis not present

## 2013-06-23 DIAGNOSIS — M5412 Radiculopathy, cervical region: Secondary | ICD-10-CM | POA: Diagnosis not present

## 2013-07-29 DIAGNOSIS — H11829 Conjunctivochalasis, unspecified eye: Secondary | ICD-10-CM | POA: Diagnosis not present

## 2013-07-29 DIAGNOSIS — E119 Type 2 diabetes mellitus without complications: Secondary | ICD-10-CM | POA: Diagnosis not present

## 2013-07-29 DIAGNOSIS — Z961 Presence of intraocular lens: Secondary | ICD-10-CM | POA: Diagnosis not present

## 2013-07-29 DIAGNOSIS — H348192 Central retinal vein occlusion, unspecified eye, stable: Secondary | ICD-10-CM | POA: Diagnosis not present

## 2013-07-29 DIAGNOSIS — H4011X0 Primary open-angle glaucoma, stage unspecified: Secondary | ICD-10-CM | POA: Diagnosis not present

## 2013-07-29 DIAGNOSIS — H04129 Dry eye syndrome of unspecified lacrimal gland: Secondary | ICD-10-CM | POA: Diagnosis not present

## 2013-07-29 DIAGNOSIS — H02839 Dermatochalasis of unspecified eye, unspecified eyelid: Secondary | ICD-10-CM | POA: Diagnosis not present

## 2013-08-04 DIAGNOSIS — E119 Type 2 diabetes mellitus without complications: Secondary | ICD-10-CM | POA: Diagnosis not present

## 2013-08-14 DIAGNOSIS — E7800 Pure hypercholesterolemia, unspecified: Secondary | ICD-10-CM | POA: Diagnosis not present

## 2013-08-14 DIAGNOSIS — E119 Type 2 diabetes mellitus without complications: Secondary | ICD-10-CM | POA: Diagnosis not present

## 2013-08-14 DIAGNOSIS — I10 Essential (primary) hypertension: Secondary | ICD-10-CM | POA: Diagnosis not present

## 2013-08-14 DIAGNOSIS — Z0000 Encounter for general adult medical examination without abnormal findings: Secondary | ICD-10-CM | POA: Diagnosis not present

## 2013-08-14 DIAGNOSIS — K589 Irritable bowel syndrome without diarrhea: Secondary | ICD-10-CM | POA: Diagnosis not present

## 2013-08-18 DIAGNOSIS — M9981 Other biomechanical lesions of cervical region: Secondary | ICD-10-CM | POA: Diagnosis not present

## 2013-08-18 DIAGNOSIS — IMO0001 Reserved for inherently not codable concepts without codable children: Secondary | ICD-10-CM | POA: Diagnosis not present

## 2013-08-18 DIAGNOSIS — IMO0002 Reserved for concepts with insufficient information to code with codable children: Secondary | ICD-10-CM | POA: Diagnosis not present

## 2013-08-18 DIAGNOSIS — M999 Biomechanical lesion, unspecified: Secondary | ICD-10-CM | POA: Diagnosis not present

## 2013-08-18 DIAGNOSIS — M5412 Radiculopathy, cervical region: Secondary | ICD-10-CM | POA: Diagnosis not present

## 2013-09-29 DIAGNOSIS — Z23 Encounter for immunization: Secondary | ICD-10-CM | POA: Diagnosis not present

## 2013-09-30 DIAGNOSIS — IMO0002 Reserved for concepts with insufficient information to code with codable children: Secondary | ICD-10-CM | POA: Diagnosis not present

## 2013-09-30 DIAGNOSIS — M5412 Radiculopathy, cervical region: Secondary | ICD-10-CM | POA: Diagnosis not present

## 2013-09-30 DIAGNOSIS — IMO0001 Reserved for inherently not codable concepts without codable children: Secondary | ICD-10-CM | POA: Diagnosis not present

## 2013-09-30 DIAGNOSIS — M999 Biomechanical lesion, unspecified: Secondary | ICD-10-CM | POA: Diagnosis not present

## 2013-09-30 DIAGNOSIS — M9981 Other biomechanical lesions of cervical region: Secondary | ICD-10-CM | POA: Diagnosis not present

## 2013-10-07 DIAGNOSIS — E119 Type 2 diabetes mellitus without complications: Secondary | ICD-10-CM | POA: Diagnosis not present

## 2013-10-07 DIAGNOSIS — H348392 Tributary (branch) retinal vein occlusion, unspecified eye, stable: Secondary | ICD-10-CM | POA: Diagnosis not present

## 2013-10-07 DIAGNOSIS — H04129 Dry eye syndrome of unspecified lacrimal gland: Secondary | ICD-10-CM | POA: Diagnosis not present

## 2013-10-07 DIAGNOSIS — H4011X0 Primary open-angle glaucoma, stage unspecified: Secondary | ICD-10-CM | POA: Diagnosis not present

## 2013-10-07 DIAGNOSIS — Z961 Presence of intraocular lens: Secondary | ICD-10-CM | POA: Diagnosis not present

## 2013-10-29 DIAGNOSIS — H34832 Tributary (branch) retinal vein occlusion, left eye: Secondary | ICD-10-CM | POA: Diagnosis not present

## 2013-10-29 DIAGNOSIS — H409 Unspecified glaucoma: Secondary | ICD-10-CM | POA: Diagnosis not present

## 2013-10-29 HISTORY — PX: GLAUCOMA SURGERY: SHX656

## 2013-11-23 DIAGNOSIS — M9902 Segmental and somatic dysfunction of thoracic region: Secondary | ICD-10-CM | POA: Diagnosis not present

## 2013-11-23 DIAGNOSIS — M609 Myositis, unspecified: Secondary | ICD-10-CM | POA: Diagnosis not present

## 2013-11-23 DIAGNOSIS — M5412 Radiculopathy, cervical region: Secondary | ICD-10-CM | POA: Diagnosis not present

## 2013-11-23 DIAGNOSIS — M9901 Segmental and somatic dysfunction of cervical region: Secondary | ICD-10-CM | POA: Diagnosis not present

## 2013-11-23 DIAGNOSIS — M9903 Segmental and somatic dysfunction of lumbar region: Secondary | ICD-10-CM | POA: Diagnosis not present

## 2013-11-23 DIAGNOSIS — M5416 Radiculopathy, lumbar region: Secondary | ICD-10-CM | POA: Diagnosis not present

## 2013-11-24 DIAGNOSIS — S0990XA Unspecified injury of head, initial encounter: Secondary | ICD-10-CM | POA: Diagnosis not present

## 2013-12-29 DIAGNOSIS — M609 Myositis, unspecified: Secondary | ICD-10-CM | POA: Diagnosis not present

## 2013-12-29 DIAGNOSIS — M9903 Segmental and somatic dysfunction of lumbar region: Secondary | ICD-10-CM | POA: Diagnosis not present

## 2013-12-29 DIAGNOSIS — M9902 Segmental and somatic dysfunction of thoracic region: Secondary | ICD-10-CM | POA: Diagnosis not present

## 2013-12-29 DIAGNOSIS — M9901 Segmental and somatic dysfunction of cervical region: Secondary | ICD-10-CM | POA: Diagnosis not present

## 2013-12-29 DIAGNOSIS — M5412 Radiculopathy, cervical region: Secondary | ICD-10-CM | POA: Diagnosis not present

## 2013-12-29 DIAGNOSIS — M5416 Radiculopathy, lumbar region: Secondary | ICD-10-CM | POA: Diagnosis not present

## 2014-01-07 ENCOUNTER — Other Ambulatory Visit: Payer: Self-pay | Admitting: Cardiology

## 2014-01-18 ENCOUNTER — Ambulatory Visit (INDEPENDENT_AMBULATORY_CARE_PROVIDER_SITE_OTHER): Payer: Medicare Other | Admitting: Cardiology

## 2014-01-18 ENCOUNTER — Encounter: Payer: Self-pay | Admitting: Cardiology

## 2014-01-18 VITALS — BP 150/58 | HR 72 | Ht 67.0 in | Wt 150.2 lb

## 2014-01-18 DIAGNOSIS — R0789 Other chest pain: Secondary | ICD-10-CM

## 2014-01-18 NOTE — Patient Instructions (Signed)
Your physician recommends that you schedule a follow-up appointment in: 2 months with Dr. Percival Spanish    We are ordering a stress test for you to get done

## 2014-01-18 NOTE — Progress Notes (Signed)
HPI The patient presents for follow up of congestive heart failure with a preserved ejection fraction.   Since I last saw her she said some chest heaviness. This has been coming and going particularly with activity but sometimes at night when she goes to lie down. She describes it as a heaviness. There may be associated mild shortness of breath. It goes away with rest. She's not sure that she's had this before although I did describe some discomfort in her last visit. She's not having any associated diaphoresis. She's had one episode of nausea but this typically does not occur. She's not describing any new PND or orthopnea. She's had no weight gain or edema. He still goes down her stairs to use her prior.  Allergies  Allergen Reactions  . Actos [Pioglitazone Hydrochloride]   . Clonidine Derivatives   . Combigan [Brimonidine Tartrate-Timolol]   . Dorzolamide   . Felodipine Er   . Hctz [Hydrochlorothiazide]   . Lisinopril   . Monopril [Fosinopril Sodium]   . Norvasc [Amlodipine Besylate]   . Penicillins   . Plendil [Felodipine]     Current Outpatient Prescriptions  Medication Sig Dispense Refill  . acetaminophen (TYLENOL) 500 MG tablet Take 500 mg by mouth 2 (two) times daily.    [name] amitriptyline (ELAVIL) 25 MG tablet Take 1 tablet by mouth daily.    [name] aspirin 81 MG tablet Take 81 mg by mouth daily.      [name] atorvastatin (LIPITOR) 10 MG tablet Take 10 mg by mouth daily.    [name] BESIVANCE 0.6 % SUSP Place 1 drop into the right eye daily.    . brinzolamide (AZOPT) 1 % ophthalmic suspension 1 drop 2 (two) times daily.    . Calcium Carbonate-Vitamin D (CALCIUM-VITAMIN D) 500-200 MG-UNIT per tablet Take 1 tablet by mouth 2 (two) times daily with a meal.    . carvedilol (COREG) 12.5 MG tablet Take 1 tablet (12.5 mg total) by mouth 2 (two) times daily with a meal. 60 tablet 6  . cyanocobalamin 1000 MCG tablet Take 100 mcg by mouth daily.    [name] dicyclomine (BENTYL) 10 MG capsule Take 10 mg by mouth  4 (four) times daily -  before meals and at bedtime.      . DUREZOL 0.05 % EMUL Place 1 drop into the left eye 2 (two) times daily.    . fish oil-omega-3 fatty acids 1000 MG capsule Take 2 g by mouth daily.    . furosemide (LASIX) 40 MG tablet Take 1 tablet by mouth daily.    . isosorbide mononitrate (IMDUR) 30 MG 24 hr tablet TAKE ONE (1) TABLET BY MOUTH EVERY DAY 30 tablet 1  . LANTUS SOLOSTAR 100 UNIT/ML injection as directed.    . latanoprost (XALATAN) 0.005 % ophthalmic solution     . losartan (COZAAR) 100 MG tablet Take 1 tablet by mouth daily.    . Multiple Vitamin (MULTIVITAMIN) tablet Take 1 tablet by mouth daily.    . Multiple Vitamins-Minerals (EYE VITAMINS PO) Take by mouth 2 (two) times daily.    [name] omeprazole (PRILOSEC OTC) 20 MG tablet Take 20 mg by mouth daily.      . Probiotic Product (PROBIOTIC FORMULA) CAPS Take by mouth daily.    . timolol (TIMOPTIC) 0.5 % ophthalmic solution Place 1 drop into the right eye daily.     . hydrALAZINE (APRESOLINE) 50 MG tablet Take 0.5 tablets (25 mg total) by mouth 2 (two) times daily. 60 tablet 3  .  hydrALAZINE (APRESOLINE) 50 MG tablet Take 50 mg by mouth 2 (two) times daily.     No current facility-administered medications for this visit.    Past Medical History  Diagnosis Date  . DM (diabetes mellitus)   . Dyslipidemia   . Unspecified hypertensive heart disease without heart failure   . CHF (congestive heart failure)     Diastolic. Precipitated by Actos  . Coronary artery disease 02/2010    abnormal nuclear stress test with mild ischemia in apical anterolateral wall.   . Hypertension   . Chronic kidney disease     mild-moderate. GFR was 35 in 02/12    Past Surgical History  Procedure Laterality Date  . Tonsillectomy    . Hysterectomy - unknown type    . Cholecystectomy    . Cataract extraction, bilateral    . Neck mass excision      non cancerous    ROS:  As stated in the HPI and negative for all other  systems.  PHYSICAL EXAM BP 150/58 mmHg  Pulse 72  Ht 5\' 7"  (1.702 m)  Wt 150 lb 3.2 oz (68.13 kg)  BMI 23.52 kg/m2 GENERAL:  Well appearing NECK:  Positive 8 cm jugular venous distention, waveform within normal limits, carotid upstroke brisk and symmetric, no bruits, no thyromegaly LUNGS:  Clear to auscultation bilaterally BACK:  No CVA tenderness CHEST:  Unremarkable HEART:  PMI not displaced or sustained,S1 and S2 within normal limits, no S3, no S4, no clicks, no rubs, no murmurs ABD:  Flat, positive bowel sounds normal in frequency in pitch, no bruits, no rebound, no guarding, no midline pulsatile mass, no hepatomegaly, no splenomegaly EXT:  2 plus pulses throughout, no edema, no cyanosis no clubbing NEURO:  Nonfocal.   EKG: Sinus rhythm, rate 72, LV hypertrophy, left axis deviation, repolarization changes.  01/18/2014  ASSESSMENT AND PLAN  CHF (congestive heart failure) - She seems to be euvolemic. No change in therapy is indicated.  Coronary artery disease -  She did have a stress perfusion study in the past (20120 with very mild ischemia in the apical distribution. Given the progression of her symptoms from previous I'm going to screen her with a stress perfusion study. Further evaluation will be based on these results. She will remain for now on the meds as listed overall I will have a low threshold to adjust her Imdur upward.  Hypertension -  The blood pressure is at target when I reviewed her home blood pressure diary at this visit. No change in medications is indicated. We will continue with therapeutic lifestyle changes (TLC).

## 2014-01-21 ENCOUNTER — Telehealth (HOSPITAL_COMMUNITY): Payer: Self-pay

## 2014-01-21 NOTE — Telephone Encounter (Signed)
Encounter complete. 

## 2014-01-26 ENCOUNTER — Ambulatory Visit (HOSPITAL_COMMUNITY)
Admission: RE | Admit: 2014-01-26 | Discharge: 2014-01-26 | Disposition: A | Discharge status: Home / Self Care | Payer: Medicare Other | Source: Ambulatory Visit | Attending: Cardiovascular Disease | Admitting: Cardiovascular Disease

## 2014-01-26 DIAGNOSIS — Z794 Long term (current) use of insulin: Secondary | ICD-10-CM | POA: Diagnosis not present

## 2014-01-26 DIAGNOSIS — E669 Obesity, unspecified: Secondary | ICD-10-CM | POA: Insufficient documentation

## 2014-01-26 DIAGNOSIS — Z8249 Family history of ischemic heart disease and other diseases of the circulatory system: Secondary | ICD-10-CM | POA: Diagnosis not present

## 2014-01-26 DIAGNOSIS — E785 Hyperlipidemia, unspecified: Secondary | ICD-10-CM | POA: Insufficient documentation

## 2014-01-26 DIAGNOSIS — R079 Chest pain, unspecified: Secondary | ICD-10-CM | POA: Insufficient documentation

## 2014-01-26 DIAGNOSIS — R002 Palpitations: Secondary | ICD-10-CM | POA: Diagnosis not present

## 2014-01-26 DIAGNOSIS — E119 Type 2 diabetes mellitus without complications: Secondary | ICD-10-CM | POA: Insufficient documentation

## 2014-01-26 DIAGNOSIS — R0789 Other chest pain: Secondary | ICD-10-CM | POA: Diagnosis not present

## 2014-01-26 DIAGNOSIS — R5383 Other fatigue: Secondary | ICD-10-CM | POA: Diagnosis not present

## 2014-01-26 DIAGNOSIS — R0609 Other forms of dyspnea: Secondary | ICD-10-CM | POA: Diagnosis not present

## 2014-01-26 DIAGNOSIS — I10 Essential (primary) hypertension: Secondary | ICD-10-CM | POA: Diagnosis not present

## 2014-01-26 MED ORDER — REGADENOSON 0.4 MG/5ML IV SOLN
0.4000 mg | Freq: Once | INTRAVENOUS | Status: AC
  Administered 2014-01-26: 0.4 mg via INTRAVENOUS

## 2014-01-26 MED ORDER — TECHNETIUM TC 99M SESTAMIBI GENERIC - CARDIOLITE
32.7000 | Freq: Once | INTRAVENOUS | Status: AC | PRN
  Administered 2014-01-26: 32.7 via INTRAVENOUS

## 2014-01-26 MED ORDER — AMINOPHYLLINE 25 MG/ML IV SOLN
75.0000 mg | Freq: Once | INTRAVENOUS | Status: AC
  Administered 2014-01-26: 75 mg via INTRAVENOUS

## 2014-01-26 MED ORDER — TECHNETIUM TC 99M SESTAMIBI GENERIC - CARDIOLITE
10.3000 | Freq: Once | INTRAVENOUS | Status: AC | PRN
  Administered 2014-01-26: 10 via INTRAVENOUS

## 2014-01-26 NOTE — Procedures (Addendum)
[facility] [address] [place] [address] [id number]  Cardiology Nuclear Med Study  Kelly Ruiz is a 81 y.o. female     MRN : [id number]     DOB: 1923-05-19  Procedure Date: 01/26/2014  Nuclear Med Background Indication for Stress Test:  Evaluation for Ischemia History:  CAD;CHF;CKD;No prior respiratory history reported;Last NUC MPI on 03/27/2010-ischemia;EF=70% Cardiac Risk Factors: Family History - CAD, Hypertension, IDDM Type 2, Lipids and Obesity  Symptoms:  Chest Pain, DOE, Fatigue, Palpitations and SOB   Nuclear Pre-Procedure Caffeine/Decaff Intake:  12:00am NPO After: 10am   IV Site: R Forearm  IV 0.9% NS with Angio Cath:  22g  Chest Size (in):  n/a IV Started by: Rolene Course, RN  Height: 5\' 7"  (1.702 m)  Cup Size: C  BMI:  Body mass index is 23.49 kg/(m^2). Weight:  150 lb (68.04 kg)   Tech Comments:  n/a    Nuclear Med Study 1 or 2 day study: 1 day  Stress Test Type:  [place] Provider:  Minus Breeding, MD   Resting Radionuclide: Technetium 7m Sestamibi  Resting Radionuclide Dose: 10.3 mCi   Stress Radionuclide:  Technetium 76m Sestamibi  Stress Radionuclide Dose: 32.7 mCi           Stress Protocol Rest HR: 60 Stress HR: 87  Rest BP: 127/74 Stress BP: 152/62  Exercise Time (min): n/a METS: n/a   Predicted Max HR: 132 bpm % Max HR: 65.91 bpm Rate Pressure Product: 15486  Dose of Adenosine (mg):  n/a Dose of Lexiscan: 0.4 mg  Dose of Atropine (mg): n/a Dose of Dobutamine: n/a mcg/kg/min (at max HR)  Stress Test Technologist: Leane Para, CCT Nuclear Technologist: Imagene Riches, CNMT   Rest Procedure:  Myocardial perfusion imaging was performed at rest 45 minutes following the intravenous administration of Technetium 57m Sestamibi. Stress Procedure:  The patient received IV Lexiscan 0.4 mg over 15-seconds.  Technetium 25m Sestamibi injected IV at 30-seconds.  Patient  experienced SOB and 75 mg Aminophylline IV was administered. There were no significant changes with Lexiscan.  Quantitative spect images were obtained after a 45 minute delay.  Transient Ischemic Dilatation (Normal <1.22): 1.16  QGS EDV:  73 ml QGS ESV:  29 ml LV Ejection Fraction: 60%       Rest ECG: Non specific IVCD  Stress ECG: No significant change from baseline ECG  QPS Raw Data Images:  Normal; no motion artifact; normal heart/lung ratio. Stress Images:  Normal homogeneous uptake in all areas of the myocardium. Rest Images:  Normal homogeneous uptake in all areas of the myocardium. Subtraction (SDS):  No evidence of ischemia.  Impression Exercise Capacity:  Lexiscan with no exercise. BP Response:  Normal blood pressure response. Clinical Symptoms:  No significant symptoms noted. ECG Impression:  No significant ST segment change suggestive of ischemia. Comparison with Prior Nuclear Study: Prior study suggested ischemia not present on current study  Overall Impression:  Normal stress nuclear study.  LV Wall Motion:  NL LV Function; NL Wall Motion   Lorretta Harp, MD  01/27/2014 8:15 AM

## 2014-02-03 DIAGNOSIS — I10 Essential (primary) hypertension: Secondary | ICD-10-CM | POA: Diagnosis not present

## 2014-02-03 DIAGNOSIS — E780 Pure hypercholesterolemia: Secondary | ICD-10-CM | POA: Diagnosis not present

## 2014-02-03 DIAGNOSIS — K589 Irritable bowel syndrome without diarrhea: Secondary | ICD-10-CM | POA: Diagnosis not present

## 2014-02-03 DIAGNOSIS — E119 Type 2 diabetes mellitus without complications: Secondary | ICD-10-CM | POA: Diagnosis not present

## 2014-02-10 ENCOUNTER — Other Ambulatory Visit: Payer: Self-pay | Admitting: *Deleted

## 2014-02-10 MED ORDER — ISOSORBIDE MONONITRATE ER 30 MG PO TB24: ORAL_TABLET | ORAL | Status: DC

## 2014-03-03 DIAGNOSIS — Z961 Presence of intraocular lens: Secondary | ICD-10-CM | POA: Diagnosis not present

## 2014-03-03 DIAGNOSIS — H4011X0 Primary open-angle glaucoma, stage unspecified: Secondary | ICD-10-CM | POA: Diagnosis not present

## 2014-03-18 DIAGNOSIS — J019 Acute sinusitis, unspecified: Secondary | ICD-10-CM | POA: Diagnosis not present

## 2014-03-19 ENCOUNTER — Ambulatory Visit (INDEPENDENT_AMBULATORY_CARE_PROVIDER_SITE_OTHER): Payer: Medicare Other | Admitting: Cardiology

## 2014-03-19 ENCOUNTER — Encounter: Payer: Self-pay | Admitting: Cardiology

## 2014-03-19 VITALS — BP 126/62 | HR 72 | Ht 60.0 in | Wt 151.0 lb

## 2014-03-19 DIAGNOSIS — I509 Heart failure, unspecified: Secondary | ICD-10-CM

## 2014-03-19 NOTE — Progress Notes (Signed)
HPI The patient presents for follow up of congestive heart failure with a preserved ejection fraction.  In January we did a stress test which demonstrated no ischemia or infarct. She had a preserved ejection fraction. She been having some chest discomfort and heaviness doing something like climbing the stairs. She still having this although it is better since I started some Imdur. She is not having any resting symptoms. She's not having any palpitations, presyncope or syncope. She's not having any PND or orthopnea. She's having no weight gain or edema.  Allergies  Allergen Reactions  . Actos [Pioglitazone Hydrochloride]   . Clonidine Derivatives   . Combigan [Brimonidine Tartrate-Timolol]   . Dorzolamide   . Felodipine Er   . Hctz [Hydrochlorothiazide]   . Lisinopril   . Monopril [Fosinopril Sodium]   . Norvasc [Amlodipine Besylate]   . Penicillins   . Plendil [Felodipine]     Current Outpatient Prescriptions  Medication Sig Dispense Refill  . acetaminophen (TYLENOL) 500 MG tablet Take 500 mg by mouth 2 (two) times daily.    [name] amitriptyline (ELAVIL) 25 MG tablet Take 1 tablet by mouth daily.    [name] aspirin 81 MG tablet Take 81 mg by mouth daily.      [name] atorvastatin (LIPITOR) 10 MG tablet Take 10 mg by mouth daily.    [name] BESIVANCE 0.6 % SUSP Place 1 drop into the right eye daily.    . brinzolamide (AZOPT) 1 % ophthalmic suspension 1 drop 2 (two) times daily.    . Calcium Carbonate-Vitamin D (CALCIUM-VITAMIN D) 500-200 MG-UNIT per tablet Take 1 tablet by mouth 2 (two) times daily with a meal.    . carvedilol (COREG) 12.5 MG tablet Take 1 tablet (12.5 mg total) by mouth 2 (two) times daily with a meal. 60 tablet 6  . cyanocobalamin 1000 MCG tablet Take 100 mcg by mouth daily.    [name] dicyclomine (BENTYL) 10 MG capsule Take 10 mg by mouth 4 (four) times daily -  before meals and at bedtime.      . DUREZOL 0.05 % EMUL Place 1 drop into the left eye 2 (two) times daily.    . fish oil-omega-3  fatty acids 1000 MG capsule Take 2 g by mouth daily.    . furosemide (LASIX) 40 MG tablet Take 1 tablet by mouth daily.    . hydrALAZINE (APRESOLINE) 50 MG tablet Take 0.5 tablets (25 mg total) by mouth 2 (two) times daily. 60 tablet 3  . hydrALAZINE (APRESOLINE) 50 MG tablet Take 50 mg by mouth 2 (two) times daily.    . isosorbide mononitrate (IMDUR) 30 MG 24 hr tablet TAKE ONE (1) TABLET BY MOUTH EVERY DAY 30 tablet 1  . LANTUS SOLOSTAR 100 UNIT/ML injection as directed.    . latanoprost (XALATAN) 0.005 % ophthalmic solution     . losartan (COZAAR) 100 MG tablet Take 1 tablet by mouth daily.    . Multiple Vitamin (MULTIVITAMIN) tablet Take 1 tablet by mouth daily.    . Multiple Vitamins-Minerals (EYE VITAMINS PO) Take by mouth 2 (two) times daily.    [name] omeprazole (PRILOSEC OTC) 20 MG tablet Take 20 mg by mouth daily.      . Probiotic Product (PROBIOTIC FORMULA) CAPS Take by mouth daily.    . timolol (TIMOPTIC) 0.5 % ophthalmic solution Place 1 drop into the right eye daily.      No current facility-administered medications for this visit.    Past Medical History  Diagnosis  Date  . DM (diabetes mellitus)   . Dyslipidemia   . Unspecified hypertensive heart disease without heart failure   . CHF (congestive heart failure)     Diastolic. Precipitated by Actos  . Coronary artery disease 02/2010    abnormal nuclear stress test with mild ischemia in apical anterolateral wall.   . Hypertension   . Chronic kidney disease     mild-moderate. GFR was 35 in 02/12    Past Surgical History  Procedure Laterality Date  . Tonsillectomy    . Hysterectomy - unknown type    . Cholecystectomy    . Cataract extraction, bilateral    . Neck mass excision      non cancerous    ROS:  As stated in the HPI and negative for all other systems.  PHYSICAL EXAM BP 126/62 mmHg  Pulse 72  Ht 5' (1.524 m)  Wt 151 lb (68.493 kg)  BMI 29.49 kg/m2 GENERAL:  Well appearing NECK:  Positive 8 cm jugular  venous distention, waveform within normal limits, carotid upstroke brisk and symmetric, no bruits, no thyromegaly LUNGS:  Clear to auscultation bilaterally BACK:  No CVA tenderness CHEST:  Unremarkable HEART:  PMI not displaced or sustained,S1 and S2 within normal limits, no S3, no S4, no clicks, no rubs, no murmurs ABD:  Flat, positive bowel sounds normal in frequency in pitch, no bruits, no rebound, no guarding, no midline pulsatile mass, no hepatomegaly, no splenomegaly EXT:  2 plus pulses throughout, no edema, no cyanosis no clubbing NEURO:  Nonfocal.   EKG: Sinus rhythm, rate 72, LV hypertrophy, left axis deviation, repolarization changes.  03/19/2014  ASSESSMENT AND PLAN  CHF (congestive heart failure) - She seems to be euvolemic. No change in therapy is indicated.  Coronary artery disease -  She has had some chest pain climbing up the stairs that was improved with the Imdur. She had a negative stress test. I will increase the Imdur to 60 mg daily.  Hypertension -  The blood pressure is at target. No change in medications is indicated. We will continue with therapeutic lifestyle changes (TLC).

## 2014-03-19 NOTE — Patient Instructions (Signed)
Your physician recommends that you schedule a follow-up appointment in: 6 months  Increase your imdur to 60 mg daily

## 2014-03-26 ENCOUNTER — Other Ambulatory Visit: Payer: Self-pay | Admitting: *Deleted

## 2014-03-26 ENCOUNTER — Telehealth: Payer: Self-pay

## 2014-03-26 ENCOUNTER — Other Ambulatory Visit: Payer: Self-pay

## 2014-03-26 ENCOUNTER — Telehealth: Payer: Self-pay | Admitting: Cardiology

## 2014-03-26 MED ORDER — ISOSORBIDE MONONITRATE ER 30 MG PO TB24: ORAL_TABLET | ORAL | Status: DC

## 2014-03-26 MED ORDER — NITROGLYCERIN 0.4 MG SL SUBL: 0.4000 mg | SUBLINGUAL_TABLET | SUBLINGUAL | Status: DC | PRN

## 2014-03-26 MED ORDER — ISOSORBIDE MONONITRATE ER 60 MG PO TB24: 60.0000 mg | ORAL_TABLET | Freq: Every day | ORAL | Status: DC

## 2014-03-26 NOTE — Telephone Encounter (Signed)
Kelly Ruiz is calling about a prescription that was suppose to be sent with changes of her isosorbide 30mg  to 60 mg and was supposed to have a new prescription sent for Nitro .  Please call   Thanks

## 2014-03-26 NOTE — Telephone Encounter (Signed)
ntg and imdur sent again pt. called

## 2014-03-27 NOTE — Telephone Encounter (Signed)
Yes.  Thank you.

## 2014-03-29 ENCOUNTER — Other Ambulatory Visit: Payer: Self-pay

## 2014-03-29 MED ORDER — NITROGLYCERIN 0.4 MG SL SUBL: 0.4000 mg | SUBLINGUAL_TABLET | SUBLINGUAL | Status: DC | PRN

## 2014-03-30 ENCOUNTER — Encounter: Payer: Self-pay | Admitting: Cardiology

## 2014-04-28 DIAGNOSIS — H4011X3 Primary open-angle glaucoma, severe stage: Secondary | ICD-10-CM | POA: Diagnosis not present

## 2014-04-28 DIAGNOSIS — Z961 Presence of intraocular lens: Secondary | ICD-10-CM | POA: Diagnosis not present

## 2014-04-28 DIAGNOSIS — E119 Type 2 diabetes mellitus without complications: Secondary | ICD-10-CM | POA: Diagnosis not present

## 2014-05-12 DIAGNOSIS — Z23 Encounter for immunization: Secondary | ICD-10-CM | POA: Diagnosis not present

## 2014-05-12 DIAGNOSIS — M25561 Pain in right knee: Secondary | ICD-10-CM | POA: Diagnosis not present

## 2014-05-12 DIAGNOSIS — S8991XA Unspecified injury of right lower leg, initial encounter: Secondary | ICD-10-CM | POA: Diagnosis not present

## 2014-05-12 DIAGNOSIS — M25551 Pain in right hip: Secondary | ICD-10-CM | POA: Diagnosis not present

## 2014-05-12 DIAGNOSIS — S8001XA Contusion of right knee, initial encounter: Secondary | ICD-10-CM | POA: Diagnosis not present

## 2014-05-12 DIAGNOSIS — S7001XA Contusion of right hip, initial encounter: Secondary | ICD-10-CM | POA: Diagnosis not present

## 2014-05-12 DIAGNOSIS — S79911A Unspecified injury of right hip, initial encounter: Secondary | ICD-10-CM | POA: Diagnosis not present

## 2014-05-19 DIAGNOSIS — S8002XA Contusion of left knee, initial encounter: Secondary | ICD-10-CM | POA: Diagnosis not present

## 2014-05-19 DIAGNOSIS — W19XXXA Unspecified fall, initial encounter: Secondary | ICD-10-CM | POA: Diagnosis not present

## 2014-05-19 DIAGNOSIS — S7011XA Contusion of right thigh, initial encounter: Secondary | ICD-10-CM | POA: Diagnosis not present

## 2014-05-26 DIAGNOSIS — R269 Unspecified abnormalities of gait and mobility: Secondary | ICD-10-CM | POA: Diagnosis not present

## 2014-05-26 DIAGNOSIS — M6281 Muscle weakness (generalized): Secondary | ICD-10-CM | POA: Diagnosis not present

## 2014-05-28 DIAGNOSIS — R269 Unspecified abnormalities of gait and mobility: Secondary | ICD-10-CM | POA: Diagnosis not present

## 2014-05-28 DIAGNOSIS — M6281 Muscle weakness (generalized): Secondary | ICD-10-CM | POA: Diagnosis not present

## 2014-05-31 DIAGNOSIS — R269 Unspecified abnormalities of gait and mobility: Secondary | ICD-10-CM | POA: Diagnosis not present

## 2014-05-31 DIAGNOSIS — M6281 Muscle weakness (generalized): Secondary | ICD-10-CM | POA: Diagnosis not present

## 2014-06-04 DIAGNOSIS — R269 Unspecified abnormalities of gait and mobility: Secondary | ICD-10-CM | POA: Diagnosis not present

## 2014-06-04 DIAGNOSIS — M6281 Muscle weakness (generalized): Secondary | ICD-10-CM | POA: Diagnosis not present

## 2014-06-09 DIAGNOSIS — R269 Unspecified abnormalities of gait and mobility: Secondary | ICD-10-CM | POA: Diagnosis not present

## 2014-06-09 DIAGNOSIS — M6281 Muscle weakness (generalized): Secondary | ICD-10-CM | POA: Diagnosis not present

## 2014-06-11 DIAGNOSIS — R269 Unspecified abnormalities of gait and mobility: Secondary | ICD-10-CM | POA: Diagnosis not present

## 2014-06-11 DIAGNOSIS — M6281 Muscle weakness (generalized): Secondary | ICD-10-CM | POA: Diagnosis not present

## 2014-06-14 DIAGNOSIS — R269 Unspecified abnormalities of gait and mobility: Secondary | ICD-10-CM | POA: Diagnosis not present

## 2014-06-14 DIAGNOSIS — M6281 Muscle weakness (generalized): Secondary | ICD-10-CM | POA: Diagnosis not present

## 2014-06-18 DIAGNOSIS — M6281 Muscle weakness (generalized): Secondary | ICD-10-CM | POA: Diagnosis not present

## 2014-06-18 DIAGNOSIS — R269 Unspecified abnormalities of gait and mobility: Secondary | ICD-10-CM | POA: Diagnosis not present

## 2014-06-21 ENCOUNTER — Telehealth: Payer: Self-pay | Admitting: Cardiology

## 2014-06-21 NOTE — Telephone Encounter (Signed)
Returned patient's call to schedule an appointment with Dr. Percival Spanish.

## 2014-07-07 ENCOUNTER — Encounter: Payer: Self-pay | Admitting: Cardiology

## 2014-08-16 DIAGNOSIS — E119 Type 2 diabetes mellitus without complications: Secondary | ICD-10-CM | POA: Diagnosis not present

## 2014-08-16 DIAGNOSIS — Z0000 Encounter for general adult medical examination without abnormal findings: Secondary | ICD-10-CM | POA: Diagnosis not present

## 2014-08-16 DIAGNOSIS — I10 Essential (primary) hypertension: Secondary | ICD-10-CM | POA: Diagnosis not present

## 2014-08-16 DIAGNOSIS — I2510 Atherosclerotic heart disease of native coronary artery without angina pectoris: Secondary | ICD-10-CM | POA: Diagnosis not present

## 2014-08-23 DIAGNOSIS — H04123 Dry eye syndrome of bilateral lacrimal glands: Secondary | ICD-10-CM | POA: Diagnosis not present

## 2014-08-23 DIAGNOSIS — Z961 Presence of intraocular lens: Secondary | ICD-10-CM | POA: Diagnosis not present

## 2014-08-23 DIAGNOSIS — H4011X3 Primary open-angle glaucoma, severe stage: Secondary | ICD-10-CM | POA: Diagnosis not present

## 2014-09-13 ENCOUNTER — Ambulatory Visit: Payer: PRIVATE HEALTH INSURANCE | Admitting: Cardiology

## 2014-09-24 ENCOUNTER — Ambulatory Visit (INDEPENDENT_AMBULATORY_CARE_PROVIDER_SITE_OTHER): Payer: Medicare Other | Admitting: Cardiology

## 2014-09-24 ENCOUNTER — Encounter: Payer: Self-pay | Admitting: Cardiology

## 2014-09-24 VITALS — BP 142/64 | HR 84 | Ht 60.0 in | Wt 150.0 lb

## 2014-09-24 DIAGNOSIS — R0989 Other specified symptoms and signs involving the circulatory and respiratory systems: Secondary | ICD-10-CM | POA: Diagnosis not present

## 2014-09-24 MED ORDER — ISOSORBIDE MONONITRATE ER 60 MG PO TB24: 90.0000 mg | ORAL_TABLET | Freq: Every day | ORAL | Status: DC

## 2014-09-24 NOTE — Progress Notes (Signed)
HPI The patient presents for follow up of congestive heart failure with a preserved ejection fraction.  In January we did a stress test which demonstrated no ischemia or infarct. She had a preserved ejection fraction. She been still  having some chest discomfort and heaviness doing something like climbing the stairs. She will get fatigued doing activity such as making the bed.  I did increase her Imdur at the last visit.  She is not having any resting symptoms. She's not having any palpitations, presyncope or syncope. She's not having any PND or orthopnea. She's having no weight gain or edema.    Allergies  Allergen Reactions  . Actos [Pioglitazone Hydrochloride]   . Clonidine Derivatives   . Combigan [Brimonidine Tartrate-Timolol]   . Dorzolamide   . Felodipine Er   . Hctz [Hydrochlorothiazide]   . Lisinopril   . Monopril [Fosinopril Sodium]   . Norvasc [Amlodipine Besylate]   . Penicillins   . Plendil [Felodipine]     Current Outpatient Prescriptions  Medication Sig Dispense Refill  . acetaminophen (TYLENOL) 500 MG tablet Take 500 mg by mouth 2 (two) times daily.    [name] amitriptyline (ELAVIL) 25 MG tablet Take 1 tablet by mouth daily.    [name] aspirin 81 MG tablet Take 81 mg by mouth daily.      [name] atorvastatin (LIPITOR) 10 MG tablet Take 10 mg by mouth daily.    . brinzolamide (AZOPT) 1 % ophthalmic suspension 1 drop 2 (two) times daily.    . Calcium Carbonate-Vitamin D (CALCIUM-VITAMIN D) 500-200 MG-UNIT per tablet Take 1 tablet by mouth 2 (two) times daily with a meal.    . carvedilol (COREG) 12.5 MG tablet Take 1 tablet (12.5 mg total) by mouth 2 (two) times daily with a meal. 60 tablet 6  . cyanocobalamin 1000 MCG tablet Take 100 mcg by mouth daily.    [name] dicyclomine (BENTYL) 10 MG capsule Take 10 mg by mouth 4 (four) times daily -  before meals and at bedtime.      . fish oil-omega-3 fatty acids 1000 MG capsule Take 2 g by mouth daily.    . furosemide (LASIX) 40 MG tablet Take 1  tablet by mouth daily.    . hydrALAZINE (APRESOLINE) 50 MG tablet Take 50 mg by mouth 2 (two) times daily.    . isosorbide mononitrate (IMDUR) 60 MG 24 hr tablet Take 1 tablet (60 mg total) by mouth daily. 90 tablet 3  . LANTUS SOLOSTAR 100 UNIT/ML injection as directed.    . latanoprost (XALATAN) 0.005 % ophthalmic solution     . losartan (COZAAR) 100 MG tablet Take 1 tablet by mouth daily.    . Multiple Vitamin (MULTIVITAMIN) tablet Take 1 tablet by mouth daily.    . Multiple Vitamins-Minerals (EYE VITAMINS PO) Take by mouth 2 (two) times daily.    . nitroGLYCERIN (NITROSTAT) 0.4 MG SL tablet Place 1 tablet (0.4 mg total) under the tongue every 5 (five) minutes as needed for chest pain. 90 tablet 3  . omeprazole (PRILOSEC OTC) 20 MG tablet Take 20 mg by mouth daily.      . Probiotic Product (PROBIOTIC FORMULA) CAPS Take by mouth daily.    . timolol (TIMOPTIC) 0.5 % ophthalmic solution Place 1 drop into the right eye daily.     . hydrALAZINE (APRESOLINE) 50 MG tablet Take 0.5 tablets (25 mg total) by mouth 2 (two) times daily. 60 tablet 3   No current facility-administered medications for this visit.  Past Medical History  Diagnosis Date  . DM (diabetes mellitus)   . Dyslipidemia   . Unspecified hypertensive heart disease without heart failure   . CHF (congestive heart failure)     Diastolic. Precipitated by Actos  . Coronary artery disease 02/2010    abnormal nuclear stress test with mild ischemia in apical anterolateral wall.   . Hypertension   . Chronic kidney disease     mild-moderate. GFR was 35 in 02/12    Past Surgical History  Procedure Laterality Date  . Tonsillectomy    . Hysterectomy - unknown type    . Cholecystectomy    . Cataract extraction, bilateral    . Neck mass excision      non cancerous    ROS:  As stated in the HPI and negative for all other systems.  PHYSICAL EXAM BP 142/64 mmHg  Pulse 84  Ht 5' (1.524 m)  Wt 150 lb (68.04 kg)  BMI 29.30  kg/m2 GENERAL:  Well appearing NECK:  Positive 8 cm jugular venous distention, waveform within normal limits, carotid upstroke brisk and symmetric, soft right bruits, no thyromegaly LUNGS:  Clear to auscultation bilaterally BACK:  No CVA tenderness CHEST:  Unremarkable HEART:  PMI not displaced or sustained,S1 and S2 within normal limits, no S3, no S4, no clicks, no rubs, brief soft apical systolic murmur, no murmurs ABD:  Flat, positive bowel sounds normal in frequency in pitch, no bruits, no rebound, no guarding, no midline pulsatile mass, no hepatomegaly, no splenomegaly EXT:  2 plus pulses throughout, no edema, no cyanosis no clubbing NEURO:  Nonfocal.   ASSESSMENT AND PLAN  CHF (congestive heart failure) - She seems to be euvolemic. No change in therapy is indicated.  Coronary artery disease -  She has had some mild chest pain still and SOB.  However, she had a negative stress test. I will increase the Imdur to 90 mg daily.  Hypertension -  The blood pressure is at target. No change in medications is indicated. We will continue with therapeutic lifestyle changes (TLC).  Bruit - I will check a Doppler

## 2014-09-24 NOTE — Patient Instructions (Signed)
Your physician wants you to follow-up in: 6 Months. You will receive a reminder letter in the mail two months in advance. If you don't receive a letter, please call our office to schedule the follow-up appointment.  Your physician has requested that you have a carotid duplex. This test is an ultrasound of the carotid arteries in your neck. It looks at blood flow through these arteries that supply the brain with blood. Allow one hour for this exam. There are no restrictions or special instructions. 6 Months  Your physician has recommended you make the following change in your medication: Increase Isosorbide 90 mg daily.

## 2014-11-01 DIAGNOSIS — Z23 Encounter for immunization: Secondary | ICD-10-CM | POA: Diagnosis not present

## 2015-01-03 DIAGNOSIS — E119 Type 2 diabetes mellitus without complications: Secondary | ICD-10-CM | POA: Diagnosis not present

## 2015-01-03 DIAGNOSIS — H04123 Dry eye syndrome of bilateral lacrimal glands: Secondary | ICD-10-CM | POA: Diagnosis not present

## 2015-01-03 DIAGNOSIS — Z961 Presence of intraocular lens: Secondary | ICD-10-CM | POA: Diagnosis not present

## 2015-01-03 DIAGNOSIS — H401133 Primary open-angle glaucoma, bilateral, severe stage: Secondary | ICD-10-CM | POA: Diagnosis not present

## 2015-01-18 DIAGNOSIS — E1165 Type 2 diabetes mellitus with hyperglycemia: Secondary | ICD-10-CM | POA: Diagnosis not present

## 2015-01-18 DIAGNOSIS — J0100 Acute maxillary sinusitis, unspecified: Secondary | ICD-10-CM | POA: Diagnosis not present

## 2015-01-20 DIAGNOSIS — E119 Type 2 diabetes mellitus without complications: Secondary | ICD-10-CM | POA: Diagnosis not present

## 2015-01-26 DIAGNOSIS — N184 Chronic kidney disease, stage 4 (severe): Secondary | ICD-10-CM | POA: Diagnosis not present

## 2015-01-26 DIAGNOSIS — E119 Type 2 diabetes mellitus without complications: Secondary | ICD-10-CM | POA: Diagnosis not present

## 2015-01-26 DIAGNOSIS — I10 Essential (primary) hypertension: Secondary | ICD-10-CM | POA: Diagnosis not present

## 2015-01-26 DIAGNOSIS — E7800 Pure hypercholesterolemia, unspecified: Secondary | ICD-10-CM | POA: Diagnosis not present

## 2015-02-17 DIAGNOSIS — E1122 Type 2 diabetes mellitus with diabetic chronic kidney disease: Secondary | ICD-10-CM | POA: Diagnosis not present

## 2015-02-17 DIAGNOSIS — Z794 Long term (current) use of insulin: Secondary | ICD-10-CM | POA: Diagnosis not present

## 2015-02-17 DIAGNOSIS — N179 Acute kidney failure, unspecified: Secondary | ICD-10-CM | POA: Diagnosis not present

## 2015-02-17 DIAGNOSIS — I129 Hypertensive chronic kidney disease with stage 1 through stage 4 chronic kidney disease, or unspecified chronic kidney disease: Secondary | ICD-10-CM | POA: Diagnosis not present

## 2015-02-17 DIAGNOSIS — N390 Urinary tract infection, site not specified: Secondary | ICD-10-CM | POA: Diagnosis not present

## 2015-02-17 DIAGNOSIS — N183 Chronic kidney disease, stage 3 (moderate): Secondary | ICD-10-CM | POA: Diagnosis not present

## 2015-02-21 DIAGNOSIS — N183 Chronic kidney disease, stage 3 (moderate): Secondary | ICD-10-CM | POA: Diagnosis not present

## 2015-02-28 DIAGNOSIS — H04123 Dry eye syndrome of bilateral lacrimal glands: Secondary | ICD-10-CM | POA: Diagnosis not present

## 2015-02-28 DIAGNOSIS — Z961 Presence of intraocular lens: Secondary | ICD-10-CM | POA: Diagnosis not present

## 2015-02-28 DIAGNOSIS — H401133 Primary open-angle glaucoma, bilateral, severe stage: Secondary | ICD-10-CM | POA: Diagnosis not present

## 2015-02-28 DIAGNOSIS — E119 Type 2 diabetes mellitus without complications: Secondary | ICD-10-CM | POA: Diagnosis not present

## 2015-03-03 DIAGNOSIS — J329 Chronic sinusitis, unspecified: Secondary | ICD-10-CM | POA: Diagnosis not present

## 2015-03-16 DIAGNOSIS — E1122 Type 2 diabetes mellitus with diabetic chronic kidney disease: Secondary | ICD-10-CM | POA: Diagnosis not present

## 2015-03-16 DIAGNOSIS — N183 Chronic kidney disease, stage 3 (moderate): Secondary | ICD-10-CM | POA: Diagnosis not present

## 2015-03-16 DIAGNOSIS — I129 Hypertensive chronic kidney disease with stage 1 through stage 4 chronic kidney disease, or unspecified chronic kidney disease: Secondary | ICD-10-CM | POA: Diagnosis not present

## 2015-03-22 ENCOUNTER — Ambulatory Visit (HOSPITAL_COMMUNITY)
Admission: RE | Admit: 2015-03-22 | Discharge: 2015-03-22 | Disposition: A | Discharge status: Home / Self Care | Payer: Medicare Other | Source: Ambulatory Visit | Attending: Cardiovascular Disease | Admitting: Cardiovascular Disease

## 2015-03-22 ENCOUNTER — Telehealth: Payer: Self-pay | Admitting: *Deleted

## 2015-03-22 DIAGNOSIS — R0989 Other specified symptoms and signs involving the circulatory and respiratory systems: Secondary | ICD-10-CM | POA: Diagnosis not present

## 2015-03-22 DIAGNOSIS — E785 Hyperlipidemia, unspecified: Secondary | ICD-10-CM | POA: Diagnosis not present

## 2015-03-22 DIAGNOSIS — E1122 Type 2 diabetes mellitus with diabetic chronic kidney disease: Secondary | ICD-10-CM | POA: Diagnosis not present

## 2015-03-22 DIAGNOSIS — I1310 Hypertensive heart and chronic kidney disease without heart failure, with stage 1 through stage 4 chronic kidney disease, or unspecified chronic kidney disease: Secondary | ICD-10-CM | POA: Diagnosis not present

## 2015-03-22 DIAGNOSIS — I6523 Occlusion and stenosis of bilateral carotid arteries: Secondary | ICD-10-CM | POA: Diagnosis not present

## 2015-03-22 DIAGNOSIS — N189 Chronic kidney disease, unspecified: Secondary | ICD-10-CM | POA: Diagnosis not present

## 2015-03-22 DIAGNOSIS — I5030 Unspecified diastolic (congestive) heart failure: Secondary | ICD-10-CM | POA: Diagnosis not present

## 2015-03-22 MED ORDER — NITROGLYCERIN 0.4 MG SL SUBL: 0.4000 mg | SUBLINGUAL_TABLET | SUBLINGUAL | Status: DC | PRN

## 2015-03-22 NOTE — Telephone Encounter (Signed)
Pt came in for carotid US. While in for test, noted to technologist that she'd been having intermittent chest pain for some weeks. Concerns were noted and I spoke to patient. She notes CP occasional, brief in duration, she doesn't seem to be getting SOB w/ this, but she does have some pain going to jaw at times.  Meds were reconciled and updated as pt and son reported for changes by other providers. Verified pt taking correct isosorbide dose, has PRN nitro on-hand.   Reinforced nitro use instruction, Rx sent to pharmacy electronically. Advised ER for unresolved symptoms if they persist and not improved w/ NTG. Pt voiced understanding.  Offered pt flex clinic visit, pt stated strong preference to see Dr. Percival Spanish only. Opening on Dr. [name] schedule for Thursday the 9th was taken - she will return for OV that day. OK to get results of carotid US at that time. We also discussed potential benefit of med mgmt visit w/ Erasmo Downer, as son raised some concerns about the amount of medication the patient is on.

## 2015-03-23 NOTE — Progress Notes (Signed)
HPI The patient presents for follow up of congestive heart failure with a preserved ejection fraction.  In January of last year we did a stress test which demonstrated no ischemia or infarct. She had a preserved ejection fraction.  She was having chest discomfort with doing something like climbing the stairs.   We have been  titrating medications for treatment of this.  She mentioned having  Chest discomfort the other a when she was getting a carotid Doppler.  This is a mid chest heaviness that happens more at night and sometimes makes her have to get up and sit up.  She waits and the pain goes away after about 5 minutes.  She might get some jaw pain at the same time.  She has not had any discomfort this week.  She has this a couple of times per week sometimes and it seems to be stable.  She does not describe associated symptoms and she cannot bring this on with activity such as mopping.  She was added to my schedule to discuss this.   Allergies  Allergen Reactions  . Actos [Pioglitazone Hydrochloride]   . Clonidine Derivatives   . Combigan [Brimonidine Tartrate-Timolol]   . Dorzolamide   . Felodipine Er   . Hctz [Hydrochlorothiazide]   . Lisinopril   . Monopril [Fosinopril Sodium]   . Norvasc [Amlodipine Besylate]   . Penicillins   . Plendil [Felodipine]     Current Outpatient Prescriptions  Medication Sig Dispense Refill  . acetaminophen (TYLENOL) 500 MG tablet Take 500 mg by mouth 2 (two) times daily.    [name] amitriptyline (ELAVIL) 25 MG tablet Take 1 tablet by mouth daily.    [name] aspirin 81 MG tablet Take 81 mg by mouth daily.      [name] atorvastatin (LIPITOR) 10 MG tablet Take 10 mg by mouth daily.    . brinzolamide (AZOPT) 1 % ophthalmic suspension 1 drop 2 (two) times daily.    . Calcium Carbonate-Vitamin D (CALCIUM-VITAMIN D) 500-200 MG-UNIT per tablet Take 1 tablet by mouth 2 (two) times daily with a meal.    . carvedilol (COREG) 12.5 MG tablet Take 1 tablet (12.5 mg total) by mouth 2  (two) times daily with a meal. 60 tablet 6  . cyanocobalamin 1000 MCG tablet Take 2,000 mcg by mouth daily.     [name] dicyclomine (BENTYL) 10 MG capsule Take 10 mg by mouth 4 (four) times daily -  before meals and at bedtime.      . dorzolamide-timolol (COSOPT) 22.3-6.8 MG/ML ophthalmic solution Place 1 drop into the right eye 2 (two) times daily.    . fish oil-omega-3 fatty acids 1000 MG capsule Take 2 g by mouth daily.    . furosemide (LASIX) 40 MG tablet Take 20 mg by mouth daily.     . hydrALAZINE (APRESOLINE) 50 MG tablet Take 100 mg by mouth 2 (two) times daily.     . isosorbide mononitrate (IMDUR) 30 MG 24 hr tablet Take 1 tablet by mouth daily. With the 60 mg tablets    . isosorbide mononitrate (IMDUR) 60 MG 24 hr tablet Take 1.5 tablets (90 mg total) by mouth daily. 135 tablet 3  . LANTUS SOLOSTAR 100 UNIT/ML injection as directed.    . latanoprost (XALATAN) 0.005 % ophthalmic solution     . losartan (COZAAR) 25 MG tablet Take 1 tablet by mouth daily.    . Multiple Vitamin (MULTIVITAMIN) tablet Take 1 tablet by mouth daily.    [name]  Multiple Vitamins-Minerals (EYE VITAMINS PO) Take by mouth 2 (two) times daily.    . nitroGLYCERIN (NITROSTAT) 0.4 MG SL tablet Place 1 tablet (0.4 mg total) under the tongue every 5 (five) minutes as needed for chest pain. 25 tablet 3  . timolol (TIMOPTIC) 0.5 % ophthalmic solution Place 1 drop into the right eye daily.      No current facility-administered medications for this visit.    Past Medical History  Diagnosis Date  . DM (diabetes mellitus) ([place])   . Dyslipidemia   . Unspecified hypertensive heart disease without heart failure   . CHF (congestive heart failure) ([facility])     Diastolic. Precipitated by Actos  . Coronary artery disease 02/2010    abnormal nuclear stress test with mild ischemia in apical anterolateral wall.   . Hypertension   . Chronic kidney disease     mild-moderate. GFR was 35 in 02/12    Past Surgical History  Procedure  Laterality Date  . Tonsillectomy    . Hysterectomy - unknown type    . Cholecystectomy    . Cataract extraction, bilateral    . Neck mass excision      non cancerous    ROS:  As stated in the HPI and negative for all other systems.  PHYSICAL EXAM BP 180/62 mmHg  Pulse 79  Ht 5' (1.524 m)  Wt 152 lb 8 oz (69.174 kg)  BMI 29.78 kg/m2 GENERAL:  Well appearing NECK:  Positive 8 cm jugular venous distention, waveform within normal limits, carotid upstroke brisk and symmetric, soft right bruits, no thyromegaly LUNGS:  Clear to auscultation bilaterally BACK:  No CVA tenderness CHEST:  Unremarkable HEART:  PMI not displaced or sustained,S1 and S2 within normal limits, no S3, no S4, no clicks, no rubs, brief soft apical systolic murmur, no murmurs ABD:  Flat, positive bowel sounds normal in frequency in pitch, no bruits, no rebound, no guarding, no midline pulsatile mass, no hepatomegaly, no splenomegaly EXT:  2 plus pulses throughout, no edema, no cyanosis no clubbing NEURO:  Nonfocal.   EKG:  NSR, rate 79, IVCD, no acute ST T wave changes.  Repolarization changes.  03/24/2015  ASSESSMENT AND PLAN  CHF (congestive heart failure) - She seems to be euvolemic. No change in therapy is indicated.  Coronary artery disease -  I will increase the Imdur to 120 mg.  She has CKD which is worsening and cath would be high risk.  We need to pursue conservative therapy.  She, her son and I had a long discussion about this.    Hypertension -  I reviewed her BP diary and it is labile.  They are going to get me more readings.  Apparently this has been managed by a nephrologist at [facility].  I will get those records.  Bruit - Doppler was done but results are not available.

## 2015-03-24 ENCOUNTER — Ambulatory Visit (INDEPENDENT_AMBULATORY_CARE_PROVIDER_SITE_OTHER): Payer: Medicare Other | Admitting: Cardiology

## 2015-03-24 ENCOUNTER — Encounter: Payer: Self-pay | Admitting: Cardiology

## 2015-03-24 VITALS — BP 180/62 | HR 79 | Ht 60.0 in | Wt 152.5 lb

## 2015-03-24 DIAGNOSIS — I2510 Atherosclerotic heart disease of native coronary artery without angina pectoris: Secondary | ICD-10-CM

## 2015-03-24 DIAGNOSIS — I10 Essential (primary) hypertension: Secondary | ICD-10-CM

## 2015-03-24 MED ORDER — ISOSORBIDE MONONITRATE ER 120 MG PO TB24: 120.0000 mg | ORAL_TABLET | Freq: Every day | ORAL | Status: DC

## 2015-03-24 NOTE — Patient Instructions (Addendum)
Dr Percival Spanish has recommended making the following medication changes: INCREASE Isosorbide (Imdur) to 120 mg at [facility]  Dr Percival Spanish recommends that you schedule a follow-up appointment in 2 weeks.  If you need a refill on your cardiac medications before your next appointment, please call your pharmacy.

## 2015-03-31 ENCOUNTER — Telehealth: Payer: Self-pay | Admitting: Cardiology

## 2015-03-31 DIAGNOSIS — I6521 Occlusion and stenosis of right carotid artery: Secondary | ICD-10-CM

## 2015-03-31 NOTE — Telephone Encounter (Signed)
New message ° ° ° ° ° °Returning a nurses call °

## 2015-03-31 NOTE — Telephone Encounter (Signed)
Patient called carotid doppler results given.Dr.Hochrein advised repeat in 1 year.

## 2015-04-03 NOTE — Progress Notes (Signed)
HPI The patient presents for follow up of congestive heart failure with a preserved ejection fraction.  In January of last year we did a stress test which demonstrated no ischemia or infarct. She had a preserved ejection fraction.  She was having chest discomfort with doing something like climbing the stairs.   We have been  titrating medications for treatment of this.    Recently she described chest discomfort when she was getting a carotid Doppler.  This was described in the previous note. We did want to manage her conservatively at her request. I increased her Imdur. She returns today and she is continuing to have this discomfort particularly at night. She has to sit up in a chair. He'll keep her from falling asleep. Goes away after 5 minutes. She'll get jaw pain with this. Is happening a few times per week. It is described in the previous note.  Allergies  Allergen Reactions  . Actos [Pioglitazone Hydrochloride]   . Clonidine Derivatives   . Combigan [Brimonidine Tartrate-Timolol]   . Dorzolamide   . Felodipine Er   . Hctz [Hydrochlorothiazide]   . Lisinopril   . Monopril [Fosinopril Sodium]   . Norvasc [Amlodipine Besylate]   . Penicillins   . Plendil [Felodipine]     Current Outpatient Prescriptions  Medication Sig Dispense Refill  . acetaminophen (TYLENOL) 500 MG tablet Take 500 mg by mouth 2 (two) times daily.    [name] amitriptyline (ELAVIL) 25 MG tablet Take 1 tablet by mouth daily.    [name] aspirin 81 MG tablet Take 81 mg by mouth daily.      [name] atorvastatin (LIPITOR) 10 MG tablet Take 10 mg by mouth daily.    . brinzolamide (AZOPT) 1 % ophthalmic suspension 1 drop 2 (two) times daily.    . Calcium Carbonate-Vitamin D (CALCIUM-VITAMIN D) 500-200 MG-UNIT per tablet Take 1 tablet by mouth 2 (two) times daily with a meal.    . carvedilol (COREG) 12.5 MG tablet Take 1 tablet (12.5 mg total) by mouth 2 (two) times daily with a meal. 60 tablet 6  . cyanocobalamin 1000 MCG tablet Take  2,000 mcg by mouth daily.     [name] dicyclomine (BENTYL) 10 MG capsule Take 10 mg by mouth 4 (four) times daily -  before meals and at bedtime.      . dorzolamide-timolol (COSOPT) 22.3-6.8 MG/ML ophthalmic solution Place 1 drop into the right eye 2 (two) times daily.    . fish oil-omega-3 fatty acids 1000 MG capsule Take 2 g by mouth daily.    . furosemide (LASIX) 40 MG tablet Take 20 mg by mouth daily.     . hydrALAZINE (APRESOLINE) 50 MG tablet Take 100 mg by mouth 2 (two) times daily.     . isosorbide mononitrate (IMDUR) 120 MG 24 hr tablet Take 1 tablet (120 mg total) by mouth daily. 90 tablet 3  . LANTUS SOLOSTAR 100 UNIT/ML injection as directed.    . latanoprost (XALATAN) 0.005 % ophthalmic solution     . losartan (COZAAR) 25 MG tablet Take 1 tablet by mouth daily.    . Multiple Vitamin (MULTIVITAMIN) tablet Take 1 tablet by mouth daily.    . Multiple Vitamins-Minerals (EYE VITAMINS PO) Take by mouth 2 (two) times daily.    . nitroGLYCERIN (NITROSTAT) 0.4 MG SL tablet Place 1 tablet (0.4 mg total) under the tongue every 5 (five) minutes as needed for chest pain. 25 tablet 3  . timolol (TIMOPTIC) 0.5 % ophthalmic solution  Place 1 drop into the right eye daily.     [name] omeprazole (PRILOSEC) 20 MG capsule Take 20 mg by mouth daily.     No current facility-administered medications for this visit.    Past Medical History  Diagnosis Date  . DM (diabetes mellitus) ([place])   . Dyslipidemia   . Unspecified hypertensive heart disease without heart failure   . CHF (congestive heart failure) ([facility])     Diastolic. Precipitated by Actos  . Coronary artery disease 02/2010    abnormal nuclear stress test with mild ischemia in apical anterolateral wall.   . Hypertension   . Chronic kidney disease     mild-moderate. GFR was 35 in 02/12    Past Surgical History  Procedure Laterality Date  . Tonsillectomy    . Hysterectomy - unknown type    . Cholecystectomy    . Cataract extraction, bilateral    .  Neck mass excision      non cancerous    ROS:  As stated in the HPI and negative for all other systems.  PHYSICAL EXAM BP 152/70 mmHg  Pulse 77  Ht 5' (1.524 m)  Wt 154 lb (69.854 kg)  BMI 30.08 kg/m2  SpO2 99% GENERAL:  Well appearing NECK:  Positive 8 cm jugular venous distention, waveform within normal limits, carotid upstroke brisk and symmetric, soft right bruits, no thyromegaly LUNGS:  Clear to auscultation bilaterally BACK:  No CVA tenderness CHEST:  Unremarkable HEART:  PMI not displaced or sustained,S1 and S2 within normal limits, no S3, no S4, no clicks, no rubs, brief soft apical systolic murmur, no murmurs ABD:  Flat, positive bowel sounds normal in frequency in pitch, no bruits, no rebound, no guarding, no midline pulsatile mass, no hepatomegaly, no splenomegaly EXT:  2 plus pulses throughout, no edema, no cyanosis no clubbing NEURO:  Nonfocal.    ASSESSMENT AND PLAN  CHF (congestive heart failure) - She seems to be euvolemic. No change in therapy is indicated.  Coronary artery disease -  She once conservative therapy and I will increase to 240 mg of Imdur. However, she agrees in 2 weeks if this is not improved she would consent to cardiac catheterization. I will check a basic metabolic profile and CBC.  Hypertension -  I reviewed her BP diary and it is labile.   Apparently this has been managed by a nephrologist at [facility].  Have been unable to get these records.  Bruit - She had moderate stenosis and she will be followed again in one year.

## 2015-04-04 ENCOUNTER — Encounter: Payer: Self-pay | Admitting: Cardiology

## 2015-04-04 ENCOUNTER — Ambulatory Visit (INDEPENDENT_AMBULATORY_CARE_PROVIDER_SITE_OTHER): Payer: Medicare Other | Admitting: Cardiology

## 2015-04-04 VITALS — BP 152/70 | HR 77 | Ht 60.0 in | Wt 154.0 lb

## 2015-04-04 DIAGNOSIS — Z79899 Other long term (current) drug therapy: Secondary | ICD-10-CM

## 2015-04-04 DIAGNOSIS — I6521 Occlusion and stenosis of right carotid artery: Secondary | ICD-10-CM

## 2015-04-04 LAB — BASIC METABOLIC PANEL
BUN: 40 mg/dL — AB (ref 7–25)
CO2: 24 mmol/L (ref 20–31)
Calcium: 8.5 mg/dL — ABNORMAL LOW (ref 8.6–10.4)
Chloride: 108 mmol/L (ref 98–110)
Creat: 1.45 mg/dL — ABNORMAL HIGH (ref 0.60–0.88)
GLUCOSE: 69 mg/dL (ref 65–99)
POTASSIUM: 4.2 mmol/L (ref 3.5–5.3)
SODIUM: 142 mmol/L (ref 135–146)

## 2015-04-04 LAB — CBC
HEMATOCRIT: 30.7 % — AB (ref 36.0–46.0)
Hemoglobin: 9.9 g/dL — ABNORMAL LOW (ref 12.0–15.0)
MCH: 30.7 pg (ref 26.0–34.0)
MCHC: 32.2 g/dL (ref 30.0–36.0)
MCV: 95.3 fL (ref 78.0–100.0)
MPV: 9.4 fL (ref 8.6–12.4)
Platelets: 188 10*3/uL (ref 150–400)
RBC: 3.22 MIL/uL — ABNORMAL LOW (ref 3.87–5.11)
RDW: 14.1 % (ref 11.5–15.5)
WBC: 7 10*3/uL (ref 4.0–10.5)

## 2015-04-04 MED ORDER — ISOSORBIDE MONONITRATE ER 120 MG PO TB24: 240.0000 mg | ORAL_TABLET | Freq: Every day | ORAL | Status: DC

## 2015-04-04 NOTE — Patient Instructions (Signed)
Your physician recommends that you schedule a follow-up appointment in: 1 Month  Your physician recommends that you return for lab work in: [place] and [place] has recommended you make the following change in your medication: Increase Imdur(Isosorbide) 240 mg daily (2 tablet with Lunch)

## 2015-04-05 ENCOUNTER — Encounter: Payer: Self-pay | Admitting: Cardiology

## 2015-04-12 DIAGNOSIS — N183 Chronic kidney disease, stage 3 (moderate): Secondary | ICD-10-CM | POA: Diagnosis not present

## 2015-04-12 DIAGNOSIS — J329 Chronic sinusitis, unspecified: Secondary | ICD-10-CM | POA: Diagnosis not present

## 2015-04-13 ENCOUNTER — Telehealth: Payer: Self-pay | Admitting: Cardiology

## 2015-04-13 NOTE — Telephone Encounter (Signed)
Faxed Release signed by patient to [facility] to obtain records per Dr Hochrein's request.  Faxed on 04/13/15. lp

## 2015-04-13 NOTE — Telephone Encounter (Signed)
Spoke to son Dominica Severin Per son , patient states for the first 2 days she felt better , but since then she has been having chest discomfort it comes a goes. She is unavailable to walk any distance without becoming shortness of braethe ,fatique, chest discomfort ( like going to grocery store) The last 2 nights unable to sleep  Patient is taking 2 tablets of 120 mg of isosorbide mn daily ( total of 240 mg) form last visit. Will defer to Dr Percival Spanish?

## 2015-04-13 NOTE — Telephone Encounter (Signed)
Kelly Ruiz is calling to let Dr. Percival Spanish know that she is getting along with the Isosorbide , but is still having discomfort in her chest. Can call the son or Mrs. Teixeira at [phone].  Thanks

## 2015-04-14 ENCOUNTER — Telehealth: Payer: Self-pay | Admitting: Cardiology

## 2015-04-14 NOTE — Telephone Encounter (Signed)
Received records from [facility] as requested by Dr Percival Spanish.  Records given to Dr Percival Spanish to review. lp

## 2015-04-14 NOTE — Telephone Encounter (Signed)
Talked with her son and she is being treated for sinus trouble.  He thinks this is the cause of the dyspnea and difficulty breathing.  I would him to call back in 5 days.  He can let me know how she is doing.  They will also fax the most recent blood work.

## 2015-04-19 DIAGNOSIS — E1122 Type 2 diabetes mellitus with diabetic chronic kidney disease: Secondary | ICD-10-CM | POA: Diagnosis not present

## 2015-04-19 DIAGNOSIS — I129 Hypertensive chronic kidney disease with stage 1 through stage 4 chronic kidney disease, or unspecified chronic kidney disease: Secondary | ICD-10-CM | POA: Diagnosis not present

## 2015-04-19 DIAGNOSIS — N183 Chronic kidney disease, stage 3 (moderate): Secondary | ICD-10-CM | POA: Diagnosis not present

## 2015-04-21 ENCOUNTER — Telehealth: Payer: Self-pay | Admitting: Cardiology

## 2015-04-21 NOTE — Telephone Encounter (Signed)
Never received renal panel from Dr.Sadiq's office.Message sent to Dr.Hochrein's CMA Nya to obtain renal panel.

## 2015-04-21 NOTE — Telephone Encounter (Signed)
Returned call to patient's son.He stated Dr.Hochrein wanted to know when mother had chest tightness.See diary below.Stated she recently had Bmet with kidney Dr.Dr.Sagiq at [facility] in [place].Stated Dr.Hochrein wanted results.  Spoke to [place] at [facility] office she will fax renal panel to [phone].

## 2015-04-21 NOTE — Telephone Encounter (Signed)
Pt's son is calling in to report the pt's health since Dr. Percival Spanish increased her Isosorbide. Her BP readings have been   3/24@ 10am- 141-58  3/27@9 :20am- 166/67  3/31@11 :30am- 143/58  4/3@11 :30am-169/? (she was in Dr. Hosie Poisson office)  4/4@12 :45pm- 178/69  Pt's son also wanted to report the days she was experiencing some heaviness in her chest  3/28- before bed  3/29- during the day w/ exertion  3/31- before bed  4/1- 2 or 3 times during the day and before bed  4/2 before bed.   Please f/u with him if need be Thanks

## 2015-04-22 NOTE — Telephone Encounter (Signed)
Pt's son is calling back to see if the lab report from Dr. Hosie Poisson office was received. Please f/u with him

## 2015-04-22 NOTE — Telephone Encounter (Signed)
Spoke with pt son letting him know we did received lab from Dr Percival Spanish

## 2015-04-22 NOTE — Telephone Encounter (Signed)
Lab received and given to Dr Percival Spanish.

## 2015-04-22 NOTE — Telephone Encounter (Signed)
Spoke with Dr Percival Spanish, he stated he will cal pt son and talk with him.

## 2015-04-25 ENCOUNTER — Telehealth: Payer: Self-pay | Admitting: Cardiology

## 2015-04-25 NOTE — Telephone Encounter (Signed)
I called the patient's home.  I called the only contact number I had to talk to her or her son.  No answer.  I left a message asking them to call us back with another number.  I will continue to try to contact her.

## 2015-04-26 ENCOUNTER — Telehealth: Payer: Self-pay | Admitting: Cardiology

## 2015-04-26 NOTE — Telephone Encounter (Signed)
The patient denies any further symptoms.  She has been feeling well for the past several days.  No further testing in change in therapy is planned at this point.

## 2015-04-26 NOTE — Telephone Encounter (Signed)
New message      Returning a call to someone to get test results

## 2015-04-26 NOTE — Telephone Encounter (Signed)
Mr Auriana Mcgugan want you to call him he said he was on the phone with another doctor concerning his dad when you called yesterday [phone]

## 2015-04-27 DIAGNOSIS — N183 Chronic kidney disease, stage 3 (moderate): Secondary | ICD-10-CM | POA: Diagnosis not present

## 2015-05-02 DIAGNOSIS — J301 Allergic rhinitis due to pollen: Secondary | ICD-10-CM | POA: Diagnosis not present

## 2015-05-12 NOTE — Progress Notes (Signed)
HPI The patient presents for follow up of congestive heart failure with a preserved ejection fraction.  In January of last year we did a stress test which demonstrated no ischemia or infarct. She had a preserved ejection fraction.  She was having chest discomfort with doing something like climbing the stairs.   We have been  titrating medications for treatment of this.    Recently she described chest discomfort when she was getting a carotid Doppler.  This was described in the previous note. We did want to manage her conservatively at her request. I increased her Imdur. She returns today and she is doing better.  She is not having the discomfort as she was having it.  She has not had to take NTG since April 7.  She feels well per her report.  Allergies  Allergen Reactions  . Actos [Pioglitazone Hydrochloride]   . Clonidine Derivatives   . Combigan [Brimonidine Tartrate-Timolol]   . Dorzolamide   . Felodipine Er   . Hctz [Hydrochlorothiazide]   . Lisinopril   . Monopril [Fosinopril Sodium]   . Norvasc [Amlodipine Besylate]   . Penicillins   . Plendil [Felodipine]     Current Outpatient Prescriptions  Medication Sig Dispense Refill  . acetaminophen (TYLENOL) 500 MG tablet Take 500 mg by mouth 2 (two) times daily.    [name] amitriptyline (ELAVIL) 25 MG tablet Take 1 tablet by mouth daily.    [name] aspirin 81 MG tablet Take 81 mg by mouth daily.      [name] atorvastatin (LIPITOR) 10 MG tablet Take 10 mg by mouth daily.    . brinzolamide (AZOPT) 1 % ophthalmic suspension 1 drop 2 (two) times daily.    . Calcium Carbonate-Vitamin D (CALCIUM-VITAMIN D) 500-200 MG-UNIT per tablet Take 1 tablet by mouth 2 (two) times daily with a meal.    . carvedilol (COREG) 12.5 MG tablet Take 1 tablet (12.5 mg total) by mouth 2 (two) times daily with a meal. 60 tablet 6  . cyanocobalamin 1000 MCG tablet Take 2,000 mcg by mouth daily.     [name] dicyclomine (BENTYL) 10 MG capsule Take 10 mg by mouth 4 (four) times daily -   before meals and at bedtime.      . dorzolamide-timolol (COSOPT) 22.3-6.8 MG/ML ophthalmic solution Place 1 drop into the right eye 2 (two) times daily.    . fish oil-omega-3 fatty acids 1000 MG capsule Take 2 g by mouth daily.    . furosemide (LASIX) 40 MG tablet Take 20 mg by mouth daily.     . hydrALAZINE (APRESOLINE) 50 MG tablet Take 100 mg by mouth 2 (two) times daily.     . isosorbide mononitrate (IMDUR) 120 MG 24 hr tablet Take 2 tablets (240 mg total) by mouth daily. 180 tablet 3  . LANTUS SOLOSTAR 100 UNIT/ML injection as directed.    . latanoprost (XALATAN) 0.005 % ophthalmic solution     . losartan (COZAAR) 25 MG tablet Take 1 tablet by mouth 2 (two) times daily.     . Multiple Vitamin (MULTIVITAMIN) tablet Take 1 tablet by mouth daily.    . Multiple Vitamins-Minerals (EYE VITAMINS PO) Take by mouth 2 (two) times daily.    . nitroGLYCERIN (NITROSTAT) 0.4 MG SL tablet Place 1 tablet (0.4 mg total) under the tongue every 5 (five) minutes as needed for chest pain. 25 tablet 3  . omeprazole (PRILOSEC) 20 MG capsule Take 20 mg by mouth daily.    . timolol (TIMOPTIC)  0.5 % ophthalmic solution Place 1 drop into the right eye daily.      No current facility-administered medications for this visit.    Past Medical History  Diagnosis Date  . DM (diabetes mellitus) ([place])   . Dyslipidemia   . Unspecified hypertensive heart disease without heart failure   . CHF (congestive heart failure) ([facility])     Diastolic. Precipitated by Actos  . Coronary artery disease 02/2010    abnormal nuclear stress test with mild ischemia in apical anterolateral wall.   . Hypertension   . Chronic kidney disease     mild-moderate. GFR was 35 in 02/12    Past Surgical History  Procedure Laterality Date  . Tonsillectomy    . Hysterectomy - unknown type    . Cholecystectomy    . Cataract extraction, bilateral    . Neck mass excision      non cancerous    ROS:  As stated in the HPI and negative for all  other systems.  PHYSICAL EXAM BP 176/72 mmHg  Pulse 64  Ht 5\' 1"  (1.549 m)  Wt 151 lb 6 oz (68.663 kg)  BMI 28.62 kg/m2 GENERAL:  Well appearing NECK:  Positive 8 cm jugular venous distention, waveform within normal limits, carotid upstroke brisk and symmetric, soft right bruits, no thyromegaly LUNGS:  Clear to auscultation bilaterally BACK:  No CVA tenderness CHEST:  Unremarkable HEART:  PMI not displaced or sustained,S1 and S2 within normal limits, no S3, no S4, no clicks, no rubs, brief soft apical systolic murmur, no murmurs ABD:  Flat, positive bowel sounds normal in frequency in pitch, no bruits, no rebound, no guarding, no midline pulsatile mass, no hepatomegaly, no splenomegaly EXT:  2 plus pulses throughout, no edema, no cyanosis no clubbing NEURO:  Nonfocal.    ASSESSMENT AND PLAN  CHF (congestive heart failure) - She seems to be euvolemic. No change in therapy is indicated.  Coronary artery disease -  She once conservative therapy.  She is not having unstable symptoms and so we will be no change in therapy. She'll let me know however it this recurs in the future.  Hypertension -  I reviewed her BP diary and it is labile.   However with these readings I think her blood pressure is pretty well controlled and averages at a target less than [id number] systolic.  Bruit - She had moderate stenosis and she will be followed again in one year.   CKD - I reviewed some recent labs that were sent to [place] from nephrology. Her creatinine was 1.87 which is up slightly as she was restarted on Cozaar. However, this point I don't think is any indication to stop the Cozaar unless the next level continues to be elevated and she's having this checked again in about a week. If her nephrologist thinks that she needs to come off of the Cozaar she should be started on hydralazine beginning with 50 mg 3 times daily. I discussed this with her and her son.

## 2015-05-13 ENCOUNTER — Ambulatory Visit (INDEPENDENT_AMBULATORY_CARE_PROVIDER_SITE_OTHER): Payer: Medicare Other | Admitting: Cardiology

## 2015-05-13 ENCOUNTER — Encounter: Payer: Self-pay | Admitting: Cardiology

## 2015-05-13 VITALS — BP 176/72 | HR 64 | Ht 61.0 in | Wt 151.4 lb

## 2015-05-13 DIAGNOSIS — I208 Other forms of angina pectoris: Secondary | ICD-10-CM | POA: Diagnosis not present

## 2015-05-13 MED ORDER — ISOSORBIDE MONONITRATE ER 120 MG PO TB24: 240.0000 mg | ORAL_TABLET | Freq: Every day | ORAL | Status: DC

## 2015-05-13 NOTE — Patient Instructions (Addendum)
Your physician wants you to follow-up in: 4 Months. You will receive a reminder letter in the mail two months in advance. If you don't receive a letter, please call our office to schedule the follow-up appointment.

## 2015-05-24 DIAGNOSIS — N183 Chronic kidney disease, stage 3 (moderate): Secondary | ICD-10-CM | POA: Diagnosis not present

## 2015-05-24 DIAGNOSIS — I129 Hypertensive chronic kidney disease with stage 1 through stage 4 chronic kidney disease, or unspecified chronic kidney disease: Secondary | ICD-10-CM | POA: Diagnosis not present

## 2015-05-24 DIAGNOSIS — E1122 Type 2 diabetes mellitus with diabetic chronic kidney disease: Secondary | ICD-10-CM | POA: Diagnosis not present

## 2015-07-04 DIAGNOSIS — H401133 Primary open-angle glaucoma, bilateral, severe stage: Secondary | ICD-10-CM | POA: Diagnosis not present

## 2015-07-04 DIAGNOSIS — E119 Type 2 diabetes mellitus without complications: Secondary | ICD-10-CM | POA: Diagnosis not present

## 2015-07-04 DIAGNOSIS — Z961 Presence of intraocular lens: Secondary | ICD-10-CM | POA: Diagnosis not present

## 2015-07-04 DIAGNOSIS — H04123 Dry eye syndrome of bilateral lacrimal glands: Secondary | ICD-10-CM | POA: Diagnosis not present

## 2015-07-15 DIAGNOSIS — J343 Hypertrophy of nasal turbinates: Secondary | ICD-10-CM | POA: Diagnosis not present

## 2015-07-15 DIAGNOSIS — R0981 Nasal congestion: Secondary | ICD-10-CM | POA: Diagnosis not present

## 2015-07-15 DIAGNOSIS — J342 Deviated nasal septum: Secondary | ICD-10-CM | POA: Diagnosis not present

## 2015-07-15 DIAGNOSIS — J339 Nasal polyp, unspecified: Secondary | ICD-10-CM | POA: Diagnosis not present

## 2015-07-22 DIAGNOSIS — J343 Hypertrophy of nasal turbinates: Secondary | ICD-10-CM | POA: Diagnosis not present

## 2015-07-22 DIAGNOSIS — J342 Deviated nasal septum: Secondary | ICD-10-CM | POA: Diagnosis not present

## 2015-07-22 DIAGNOSIS — J329 Chronic sinusitis, unspecified: Secondary | ICD-10-CM | POA: Diagnosis not present

## 2015-07-22 DIAGNOSIS — J339 Nasal polyp, unspecified: Secondary | ICD-10-CM | POA: Diagnosis not present

## 2015-07-26 ENCOUNTER — Telehealth: Payer: Self-pay | Admitting: Cardiology

## 2015-07-26 DIAGNOSIS — R0981 Nasal congestion: Secondary | ICD-10-CM | POA: Diagnosis not present

## 2015-07-26 NOTE — Telephone Encounter (Signed)
Will forward to Dr Stanford Breed DOD for review

## 2015-07-26 NOTE — Telephone Encounter (Signed)
New message    Pt C/O medication issue:  1. Name of Medication: new medication prednisone  & nasal steroid drop - perforate    2. How are you currently taking this medication (dosage and times per day)? 60 mg for 2 weeks   3. Are you having a reaction (difficulty breathing--STAT)? For nasal polyp   4. What is your medication issue? Approval from Dr. Percival Spanish.

## 2015-07-27 NOTE — Telephone Encounter (Signed)
Dr Stanford Breed unable to review patients recent office visits secondary to Some sensitive notes are blocked. Called and spoke with [place] Will forward to Dr Percival Spanish for reivew

## 2015-07-30 NOTE — Telephone Encounter (Signed)
I see no contraindications to her taking the medicines that she is calling about.

## 2015-07-31 DIAGNOSIS — R0600 Dyspnea, unspecified: Secondary | ICD-10-CM | POA: Diagnosis not present

## 2015-07-31 DIAGNOSIS — E860 Dehydration: Secondary | ICD-10-CM | POA: Diagnosis not present

## 2015-07-31 DIAGNOSIS — R0981 Nasal congestion: Secondary | ICD-10-CM | POA: Diagnosis not present

## 2015-07-31 DIAGNOSIS — R0602 Shortness of breath: Secondary | ICD-10-CM | POA: Diagnosis not present

## 2015-08-01 ENCOUNTER — Telehealth: Payer: Self-pay | Admitting: Cardiology

## 2015-08-01 NOTE — Telephone Encounter (Signed)
Pt went to [place] ER last night,question about the Singulair medicine they prescribed for her.

## 2015-08-01 NOTE — Telephone Encounter (Signed)
Per previous phone note (1 week ago), Dr. Percival Spanish was okay with prednisone and nasal steroid.

## 2015-08-01 NOTE — Telephone Encounter (Signed)
Spoke with pt son, aware okay for prednisone. ENT office closed

## 2015-08-01 NOTE — Telephone Encounter (Signed)
Follow-up     The son is calling to get the authorization for the medication

## 2015-08-01 NOTE — Telephone Encounter (Signed)
Spoke with pt son and faxed Dr Percival Spanish responds to [place] from [place]

## 2015-08-01 NOTE — Telephone Encounter (Signed)
Follow up     Pt has pus in her nasal cavity.  Her ENT want to prescribe prednisone but need ok from Dr Percival Spanish.  Please call [facility] and speak to Brett Fairy, PA at [phone] and let her know if it is ok to presc prednisone.  If any problems, call son back

## 2015-08-01 NOTE — Telephone Encounter (Signed)
Will forward to pharm md to advise 

## 2015-08-01 NOTE — Telephone Encounter (Signed)
Spoke with [employer] brooks np, aware taking prednisone is fine with [place]. Son made aware.

## 2015-08-15 DIAGNOSIS — J339 Nasal polyp, unspecified: Secondary | ICD-10-CM | POA: Diagnosis not present

## 2015-08-15 DIAGNOSIS — R0981 Nasal congestion: Secondary | ICD-10-CM | POA: Diagnosis not present

## 2015-08-15 DIAGNOSIS — I10 Essential (primary) hypertension: Secondary | ICD-10-CM | POA: Diagnosis not present

## 2015-08-18 DIAGNOSIS — E119 Type 2 diabetes mellitus without complications: Secondary | ICD-10-CM | POA: Diagnosis not present

## 2015-08-18 DIAGNOSIS — J339 Nasal polyp, unspecified: Secondary | ICD-10-CM | POA: Diagnosis not present

## 2015-08-18 DIAGNOSIS — Z1389 Encounter for screening for other disorder: Secondary | ICD-10-CM | POA: Diagnosis not present

## 2015-08-18 DIAGNOSIS — Z0000 Encounter for general adult medical examination without abnormal findings: Secondary | ICD-10-CM | POA: Diagnosis not present

## 2015-08-24 DIAGNOSIS — N183 Chronic kidney disease, stage 3 (moderate): Secondary | ICD-10-CM | POA: Diagnosis not present

## 2015-08-28 NOTE — Progress Notes (Signed)
HPI The patient presents for follow up of congestive heart failure with a preserved ejection fraction.  In January of last year we did a stress test which demonstrated no ischemia or infarct. She had a preserved ejection fraction.  She was having chest discomfort with doing something like climbing the stairs.   We have been  titrating medications for treatment of this.    Recently she described chest discomfort when she was getting a carotid Doppler.  This was described in the previous note. We did want to manage her conservatively at her request. I increased her Imdur. She returns for follow up . Since I saw her she's had problems with nasal polyps. She's been at [facility]. She's had dehydration requiring fluid. She had brief treatment with steroids. Her blood pressures been up. With all of this she's had more shortness of breath with activity and a sense of heaviness when she's doing mild activities. She was having this before it seemed to get better. She's not describing classic substernal chest pain, neck or arm discomfort. She's not having any resting shortness of breath, PND or orthopnea. She's not having any weight gain or edema.   Allergies  Allergen Reactions  . Actos [Pioglitazone Hydrochloride]   . Clonidine Derivatives   . Combigan [Brimonidine Tartrate-Timolol]   . Dorzolamide   . Felodipine Er   . Hctz [Hydrochlorothiazide]   . Lisinopril   . Monopril [Fosinopril Sodium]   . Norvasc [Amlodipine Besylate]   . Penicillins   . Plendil [Felodipine]     Current Outpatient Prescriptions  Medication Sig Dispense Refill  . acetaminophen (TYLENOL) 500 MG tablet Take 500 mg by mouth 2 (two) times daily.    [name] amitriptyline (ELAVIL) 25 MG tablet Take 1 tablet by mouth daily.    [name] aspirin 81 MG tablet Take 81 mg by mouth daily.      [name] atorvastatin (LIPITOR) 10 MG tablet Take 10 mg by mouth daily.    . brinzolamide (AZOPT) 1 % ophthalmic suspension 1 drop 2 (two) times daily.     . Calcium Carbonate-Vitamin D (CALCIUM-VITAMIN D) 500-200 MG-UNIT per tablet Take 1 tablet by mouth 2 (two) times daily with a meal.    . carvedilol (COREG) 12.5 MG tablet Take 1 tablet (12.5 mg total) by mouth 2 (two) times daily with a meal. 60 tablet 6  . cyanocobalamin 1000 MCG tablet Take 2,000 mcg by mouth daily.     [name] dicyclomine (BENTYL) 10 MG capsule Take 10 mg by mouth 4 (four) times daily -  before meals and at bedtime.      . dorzolamide-timolol (COSOPT) 22.3-6.8 MG/ML ophthalmic solution Place 1 drop into the right eye 2 (two) times daily.    . fish oil-omega-3 fatty acids 1000 MG capsule Take 2 g by mouth daily.    . furosemide (LASIX) 40 MG tablet Take 20 mg by mouth daily.     . hydrALAZINE (APRESOLINE) 50 MG tablet Take 100 mg by mouth 2 (two) times daily.     . isosorbide mononitrate (IMDUR) 120 MG 24 hr tablet Take 2 tablets (240 mg total) by mouth daily. 180 tablet 3  . LANTUS SOLOSTAR 100 UNIT/ML injection as directed.    . latanoprost (XALATAN) 0.005 % ophthalmic solution     . loratadine (CLARITIN) 10 MG tablet Take 10 mg by mouth daily.    [name] losartan (COZAAR) 50 MG tablet Take 1 tablet (50 mg total) by mouth 2 (two) times daily. [place]  tablet 3  . montelukast (SINGULAIR) 10 MG tablet Take 10 mg by mouth at bedtime.    . Multiple Vitamin (MULTIVITAMIN) tablet Take 1 tablet by mouth daily.    . Multiple Vitamins-Minerals (EYE VITAMINS PO) Take by mouth 2 (two) times daily.    . nitroGLYCERIN (NITROSTAT) 0.4 MG SL tablet Place 1 tablet (0.4 mg total) under the tongue every 5 (five) minutes as needed for chest pain. 25 tablet 3  . omeprazole (PRILOSEC) 20 MG capsule Take 20 mg by mouth daily.    . timolol (TIMOPTIC) 0.5 % ophthalmic solution Place 1 drop into the right eye daily.     . fluticasone (FLONASE) 50 MCG/ACT nasal spray Use as directed     No current facility-administered medications for this visit.     Past Medical History:  Diagnosis Date  . CHF (congestive  heart failure) (HCC)    Diastolic. Precipitated by Actos  . Chronic kidney disease    mild-moderate. GFR was 35 in 02/12  . Coronary artery disease 02/2010   abnormal nuclear stress test with mild ischemia in apical anterolateral wall.   . DM (diabetes mellitus) ([place])   . Dyslipidemia   . Hypertension   . Unspecified hypertensive heart disease without heart failure     Past Surgical History:  Procedure Laterality Date  . CATARACT EXTRACTION, BILATERAL    . CHOLECYSTECTOMY    . hysterectomy - unknown type    . NECK MASS EXCISION     non cancerous  . TONSILLECTOMY      ROS:  As stated in the HPI and negative for all other systems.  PHYSICAL EXAM BP (!) 160/66 (BP Location: Right Arm, Patient Position: Sitting, Cuff Size: Normal)   Pulse 68   Ht 5' (1.524 m)   Wt 153 lb (69.4 kg)   BMI 29.88 kg/m  GENERAL:  Well appearing NECK:  No jugular venous distention, waveform within normal limits, carotid upstroke brisk and symmetric, soft right bruits, no thyromegaly LUNGS:  Clear to auscultation bilaterally BACK:  No CVA tenderness CHEST:  Unremarkable HEART:  PMI not displaced or sustained,S1 and S2 within normal limits, no S3, no S4, no clicks, no rubs, brief soft apical systolic murmur, no murmurs ABD:  Flat, positive bowel sounds normal in frequency in pitch, no bruits, no rebound, no guarding, no midline pulsatile mass, no hepatomegaly, no splenomegaly EXT:  2 plus pulses throughout, no edema, no cyanosis no clubbing NEURO:  Nonfocal.   EKG:  Sinus rhythm, rate 68, interventricular conduction delay, left ventricular hypertrophy, leftward axis, no acute ST-T wave changes. 08/29/2015   ASSESSMENT AND PLAN  CHF (congestive heart failure) - She seems to be euvolemic. No change in therapy is indicated.  Coronary artery disease -  She wants conservative therapy.    She is once again having some symptoms which may be related to her blood pressure not being well controlled. For now  I'm going to address her blood pressure is below and see how she does with any chest heaviness and dyspnea that she's having.  Hypertension -  Her blood pressures are now consistently elevated. I'm going to increase her Cozaar to 50 mg twice daily. She has close follow-up with her nephrologist who she sees tomorrow. Given her written instructions to get a basic metabolic profile in about 10 days as well. I'll work with her nephrologist to make sure we keep close eye on the kidney function and I discussed this with her and her son.  Bruit - She had moderate stenosis and she will be followed again in one year.   CKD - As above. History of years

## 2015-08-29 ENCOUNTER — Encounter: Payer: Self-pay | Admitting: Cardiology

## 2015-08-29 ENCOUNTER — Encounter (INDEPENDENT_AMBULATORY_CARE_PROVIDER_SITE_OTHER): Payer: Self-pay

## 2015-08-29 ENCOUNTER — Ambulatory Visit (INDEPENDENT_AMBULATORY_CARE_PROVIDER_SITE_OTHER): Payer: Medicare Other | Admitting: Cardiology

## 2015-08-29 VITALS — BP 160/66 | HR 68 | Ht 60.0 in | Wt 153.0 lb

## 2015-08-29 DIAGNOSIS — I2510 Atherosclerotic heart disease of native coronary artery without angina pectoris: Secondary | ICD-10-CM | POA: Diagnosis not present

## 2015-08-29 DIAGNOSIS — I208 Other forms of angina pectoris: Secondary | ICD-10-CM

## 2015-08-29 MED ORDER — LOSARTAN POTASSIUM 50 MG PO TABS: 50.0000 mg | ORAL_TABLET | Freq: Two times a day (BID) | ORAL | 3 refills | Status: DC

## 2015-08-29 NOTE — Patient Instructions (Signed)
Medication Instructions:   INCREASE LOSARTAN TO 50 MG TWICE DAILY= 2 OF THE 25 MG TABLETS TWICE DAILY  Labwork:  Your physician recommends that you return for lab work 09-09-15  Follow-Up:  Your physician recommends that you schedule a follow-up appointment in: [place]

## 2015-08-30 DIAGNOSIS — N183 Chronic kidney disease, stage 3 (moderate): Secondary | ICD-10-CM | POA: Diagnosis not present

## 2015-08-30 DIAGNOSIS — I129 Hypertensive chronic kidney disease with stage 1 through stage 4 chronic kidney disease, or unspecified chronic kidney disease: Secondary | ICD-10-CM | POA: Diagnosis not present

## 2015-08-31 ENCOUNTER — Telehealth: Payer: Self-pay | Admitting: Cardiology

## 2015-08-31 NOTE — Telephone Encounter (Signed)
After speaking with Hilda Blades, RN who worked w/MD on 8/14, she stated they advised patient to have BMET @ nephrologist's office.   Called son and informed him of this, nephrologist is located in [place]. Informed him that labs can be done at either this MD office or the [facility] lab in our office building - they prefer nephrologist's office. Advised this is fine, provided our fax # to son.

## 2015-08-31 NOTE — Telephone Encounter (Signed)
He wants to know where is she supposed to have her lab work done on 09-09-15?

## 2015-09-06 DIAGNOSIS — Z0000 Encounter for general adult medical examination without abnormal findings: Secondary | ICD-10-CM | POA: Diagnosis not present

## 2015-09-08 ENCOUNTER — Ambulatory Visit (INDEPENDENT_AMBULATORY_CARE_PROVIDER_SITE_OTHER): Payer: Medicare Other | Admitting: Allergy and Immunology

## 2015-09-08 ENCOUNTER — Encounter: Payer: Self-pay | Admitting: Allergy and Immunology

## 2015-09-08 VITALS — BP 144/60 | HR 68 | Temp 97.5°F | Resp 20 | Ht <= 58 in | Wt 152.8 lb

## 2015-09-08 DIAGNOSIS — H1045 Other chronic allergic conjunctivitis: Secondary | ICD-10-CM

## 2015-09-08 DIAGNOSIS — J339 Nasal polyp, unspecified: Secondary | ICD-10-CM | POA: Diagnosis not present

## 2015-09-08 DIAGNOSIS — J302 Other seasonal allergic rhinitis: Secondary | ICD-10-CM | POA: Diagnosis not present

## 2015-09-08 DIAGNOSIS — J309 Allergic rhinitis, unspecified: Principal | ICD-10-CM

## 2015-09-08 DIAGNOSIS — I208 Other forms of angina pectoris: Secondary | ICD-10-CM

## 2015-09-08 DIAGNOSIS — H1010 Acute atopic conjunctivitis, unspecified eye: Secondary | ICD-10-CM

## 2015-09-08 MED ORDER — AZELASTINE HCL 0.1 % NA SOLN: NASAL | 5 refills | Status: DC

## 2015-09-08 NOTE — Progress Notes (Signed)
Dear Dr. Helene Kelp,  Thank you for referring Kelly Ruiz to the [facility] on 09/08/2015.   Below is a summation of this patient's evaluation and recommendations.  Thank you for your referral. I will keep you informed about this patient's response to treatment.   If you have any questions please to do hesitate to contact me.   Sincerely,  Jiles Prows, MD [place]   ______________________________________________________________________    NEW PATIENT NOTE  Referring Provider: Ronita Hipps, MD Primary Provider: Ronita Hipps, MD Date of office visit: 09/08/2015    Subjective:   Chief Complaint:  Kelly Ruiz (DOB: 05-Mar-1923) is a 82 y.o. female who presents to the clinic on 09/08/2015 with a chief complaint of Nasal Polyps .     HPI: Kelly Ruiz presents to this clinic in evaluation of nasal polyps. Apparently in February 2017 she developed a "cold" and sinusitis requiring antibiotic therapy by Dr. Helene Kelp. She never did resolve her significant nasal congestion and decreased ability to smell although she never developed ugly nasal discharge or headaches. She believes that her left side is more congested than her right side. She subsequently was evaluated by [facility] Who identified nasal polyps. She does have a long history of allergic rhinoconjunctivitis with some nasal congestion and sneezing occurring on a perennial basis that was a mild issue in the past. She has no other associated atopic disease. She's been consistently using Flonase 2 sprays each nostril twice a day since May and she started Singulair in June as well as Claritin. Apparently she was treated with systemic steroids in the spring and developed a significant problem with hyperglycemia and dehydration requiring emergency room evaluation. She apparently did have a CT scan of her sinuses at some point in  [facility].   Past Medical History:  Diagnosis Date  . CHF (congestive heart failure) (HCC)    Diastolic. Precipitated by Actos  . Chronic kidney disease    mild-moderate. GFR was 35 in 02/12  . Coronary artery disease 02/2010   abnormal nuclear stress test with mild ischemia in apical anterolateral wall.   . DM (diabetes mellitus) ([place])   . Dyslipidemia   . Hypertension   . Nasal polyps   . Unspecified hypertensive heart disease without heart failure     Past Surgical History:  Procedure Laterality Date  . APPENDECTOMY  1951  . CATARACT EXTRACTION, BILATERAL    . CHOLECYSTECTOMY    . GLAUCOMA SURGERY  10/29/2013  . hysterectomy - unknown type    . NECK MASS EXCISION     non cancerous  . TONSILLECTOMY        Medication List      acetaminophen 500 MG tablet Commonly known as:  TYLENOL Take 500 mg by mouth 2 (two) times daily.   amitriptyline 25 MG tablet Commonly known as:  ELAVIL Take 1 tablet by mouth daily.   aspirin 81 MG tablet Take 81 mg by mouth daily.   atorvastatin 10 MG tablet Commonly known as:  LIPITOR Take 10 mg by mouth daily.   brinzolamide 1 % ophthalmic suspension Commonly known as:  AZOPT 1 drop 2 (two) times daily.   calcium-vitamin D 500-200 MG-UNIT tablet Take 1 tablet by mouth 2 (two) times daily with a meal.   carvedilol 12.5 MG tablet Commonly known as:  COREG Take 1 tablet (12.5 mg total) by mouth 2 (two)  times daily with a meal.   cyanocobalamin 1000 MCG tablet Take 2,000 mcg by mouth daily.   dicyclomine 10 MG capsule Commonly known as:  BENTYL Take 10 mg by mouth 4 (four) times daily -  before meals and at bedtime.   dorzolamide-timolol 22.3-6.8 MG/ML ophthalmic solution Commonly known as:  COSOPT Place 1 drop into the right eye 2 (two) times daily.   EYE VITAMINS PO Take by mouth 2 (two) times daily.   fish oil-omega-3 fatty acids 1000 MG capsule Take 2 g by mouth daily.   fluticasone 50 MCG/ACT nasal  spray Commonly known as:  FLONASE Use as directed   furosemide 40 MG tablet Commonly known as:  LASIX Take 20 mg by mouth daily.   hydrALAZINE 50 MG tablet Commonly known as:  APRESOLINE Take 100 mg by mouth 2 (two) times daily.   isosorbide mononitrate 120 MG 24 hr tablet Commonly known as:  IMDUR Take 2 tablets (240 mg total) by mouth daily.   LANTUS SOLOSTAR 100 UNIT/ML Solostar Pen Generic drug:  Insulin Glargine as directed.   latanoprost 0.005 % ophthalmic solution Commonly known as:  XALATAN   loratadine 10 MG tablet Commonly known as:  CLARITIN Take 10 mg by mouth daily.   losartan 50 MG tablet Commonly known as:  COZAAR Take 1 tablet (50 mg total) by mouth 2 (two) times daily.   montelukast 10 MG tablet Commonly known as:  SINGULAIR Take 10 mg by mouth at bedtime.   multivitamin tablet Take 1 tablet by mouth daily.   nitroGLYCERIN 0.4 MG SL tablet Commonly known as:  NITROSTAT Place 1 tablet (0.4 mg total) under the tongue every 5 (five) minutes as needed for chest pain.   omeprazole 20 MG capsule Commonly known as:  PRILOSEC Take 20 mg by mouth daily.   timolol 0.5 % ophthalmic solution Commonly known as:  TIMOPTIC Place 1 drop into the right eye daily.       Allergies  Allergen Reactions  . Actos [Pioglitazone Hydrochloride]   . Combigan [Brimonidine Tartrate-Timolol] Other (See Comments)    Eye redness and itchiness, blurred vision  . Dorzolamide Other (See Comments)    Increased eye pressure, blurred vision  . Felodipine Er   . Hctz [Hydrochlorothiazide]   . Lisinopril   . Monopril [Fosinopril Sodium] Cough  . Norvasc [Amlodipine Besylate] Hives  . Penicillins   . Plendil [Felodipine]   . Clonidine Derivatives Rash    Review of systems negative except as noted in HPI / PMHx or noted below:  Review of Systems  Constitutional: Negative.   HENT: Negative.   Eyes: Negative.   Respiratory: Negative.   Cardiovascular: Negative.     Gastrointestinal: Negative.   Genitourinary: Negative.   Musculoskeletal: Negative.   Skin: Negative.   Neurological: Negative.   Endo/Heme/Allergies: Negative.   Psychiatric/Behavioral: Negative.     Family History  Problem Relation Age of Onset  . Diabetes    . Hypertension    . Stroke    . Asthma Mother   . Allergic rhinitis Mother   . Congestive Heart Failure Mother   . Stroke Mother     Social History   Social History  . Marital status: Married    Spouse name: N/A  . Number of children: 3  . Years of education: N/A   Occupational History  . retired    Social History Main Topics  . Smoking status: Never Smoker  . Smokeless tobacco: Never Used  . Alcohol  use No  . Drug use: No  . Sexual activity: Not on file   Other Topics Concern  . Not on file   Social History Narrative  . No narrative on file    Environmental and Social history  Lives in a house with a dry environment, no animals located inside the household, hardwood in the bedroom, no plastic on the bed or pillow, and no smokers located inside the household  Objective:   Vitals:   09/08/15 0956  BP: (!) 144/60  Pulse: 68  Resp: 20  Temp: 97.5 F (36.4 C)   Height: 4\' 10"  (147.3 cm) Weight: 152 lb 12.8 oz (69.3 kg)  Physical Exam  Constitutional: She is well-developed, well-nourished, and in no distress.  HENT:  Head: Normocephalic.  Right Ear: Tympanic membrane, external ear and ear canal normal.  Left Ear: Tympanic membrane, external ear and ear canal normal.  Nose: Nose normal. No mucosal edema or rhinorrhea.  Mouth/Throat: Uvula is midline, oropharynx is clear and moist and mucous membranes are normal. No oropharyngeal exudate.  Eyes: Conjunctivae are normal.  Neck: Trachea normal. No tracheal tenderness present. No tracheal deviation present. No thyromegaly present.  Cardiovascular: Normal rate, regular rhythm, S1 normal, S2 normal and normal heart sounds.   No murmur  heard. Pulmonary/Chest: Breath sounds normal. No stridor. No respiratory distress. She has no wheezes. She has no rales.  Musculoskeletal: She exhibits no edema.  Lymphadenopathy:       Head (right side): No tonsillar adenopathy present.       Head (left side): No tonsillar adenopathy present.    She has no cervical adenopathy.  Neurological: She is alert. Gait normal.  Skin: No rash noted. She is not diaphoretic. No erythema. Nails show no clubbing.  Psychiatric: Mood and affect normal.    Diagnostics: Allergy skin tests were performed. She did not demonstrate any hypersensitivity to a screening panel of aeroallergens or food   Review of a sinus CT scan dated 07/22/2015 performed at [facility] identified scattered opacified left ethmoid air cells and slight mucosal thickening along the medial wall of the left sphenoid sinus.  Assessment and Plan:    1. Allergic rhinoconjunctivitis   2. Nasal polyposis     1. Allergen avoidance measures?  2. Can increase Flonase to 2 sprays each nostril 3 times a day  3. Can add nasal Azelastine 2 sprays each nostril twice a day if needed  4. Continue montelukast 10 mg daily and loratadine 10 mg daily  5. If using systemic steroids in the future we'll need to closely monitor blood sugar levels and adjust diabetes treatment for high levels of sugar  6. Can return to clinic in 4 weeks or earlier if problem  7. Obtain fall flu vaccine   I have asked Kelly Ruiz to increase her dose of nasal steroid to see if this helps with her mucosal swelling and nasal congestion and she has the option of adding nasal antihistamine. I suspect that we will be able to increase her nasal steroid dose incrementally until that point in time in which she develops a local adverse effect from utilizing this form of high dose treatment. As well, consideration for using Qnasl should be made although her insurance company apparently does not cover this form of nasal  steroid. If she does need to use systemic steroids we'll need to monitor her blood sugars closely and she'll probably need an alteration in her daily insulin dosage. I'll regroup with her in 4 weeks  to assess her response.  Jiles Prows, MD [facility]

## 2015-09-08 NOTE — Patient Instructions (Addendum)
  1. Allergen avoidance measures?  2. Can increase Flonase to 2 sprays each nostril 3 times a day  3. Can add nasal Azelastine 2 sprays each nostril twice a day if needed  4. Continue montelukast 10 mg daily and loratadine 10 mg daily  5. If using systemic steroids in the future we'll need to closely monitor blood sugar levels and adjust diabetes treatment for high levels of sugar  6. Can return to clinic in 4 weeks or earlier if problem  7. Obtain fall flu vaccine

## 2015-09-09 DIAGNOSIS — N183 Chronic kidney disease, stage 3 (moderate): Secondary | ICD-10-CM | POA: Diagnosis not present

## 2015-10-06 ENCOUNTER — Ambulatory Visit (INDEPENDENT_AMBULATORY_CARE_PROVIDER_SITE_OTHER): Payer: Medicare Other | Admitting: Allergy and Immunology

## 2015-10-06 ENCOUNTER — Encounter: Payer: Self-pay | Admitting: Allergy and Immunology

## 2015-10-06 VITALS — BP 132/60 | HR 72 | Resp 20

## 2015-10-06 DIAGNOSIS — I208 Other forms of angina pectoris: Secondary | ICD-10-CM | POA: Diagnosis not present

## 2015-10-06 DIAGNOSIS — J3089 Other allergic rhinitis: Secondary | ICD-10-CM | POA: Diagnosis not present

## 2015-10-06 NOTE — Patient Instructions (Signed)
  1. Use nasal saline at least three times per day  2. Change Flonase to QNASL 80 one puff each nostril 1 time per day (sample)  3. DISCONTINUE Azelastine spray and loratadine tablet  4. Continue montelukast 10 mg daily    5. Return to clinic in 4 weeks  7. Obtain fall flu vaccine

## 2015-10-06 NOTE — Progress Notes (Signed)
Follow-up Note  Referring Provider: Ronita Hipps, MD Primary Provider: Ronita Hipps, MD Date of Office Visit: 10/06/2015  Subjective:   Kelly Ruiz (DOB: 01-03-1924) is a 82 y.o. female who returns to the [facility] on 10/06/2015 in re-evaluation of the following:  HPI: Kelly Ruiz returns to this clinic in reevaluation of her mucosal inflammatory condition of her upper airway associated with a component of mild chronic sinusitis. She is not really that much better since initiating medical therapy on 09/08/2015. She still remains with persistent nasal congestion and as well she is very dry in her nose and her mouth. She has been consistently using Flonase and Astelin and montelukast and loratadine.    Medication List      acetaminophen 500 MG tablet Commonly known as:  TYLENOL Take 500 mg by mouth 2 (two) times daily.   amitriptyline 25 MG tablet Commonly known as:  ELAVIL Take 1 tablet by mouth daily.   aspirin 81 MG tablet Take 81 mg by mouth daily.   atorvastatin 10 MG tablet Commonly known as:  LIPITOR Take 10 mg by mouth daily.   azelastine 0.1 % nasal spray Commonly known as:  ASTELIN Can use two sprays in each nostril twice daily if needed for runny nose and itching.   brinzolamide 1 % ophthalmic suspension Commonly known as:  AZOPT 1 drop 2 (two) times daily.   calcium-vitamin D 500-200 MG-UNIT tablet Take 1 tablet by mouth 2 (two) times daily with a meal.   carvedilol 12.5 MG tablet Commonly known as:  COREG Take 1 tablet (12.5 mg total) by mouth 2 (two) times daily with a meal.   cyanocobalamin 1000 MCG tablet Take 2,000 mcg by mouth daily.   dicyclomine 10 MG capsule Commonly known as:  BENTYL Take 10 mg by mouth 4 (four) times daily -  before meals and at bedtime.   dorzolamide-timolol 22.3-6.8 MG/ML ophthalmic solution Commonly known as:  COSOPT Place 1 drop into the right eye 2 (two) times daily.   EYE VITAMINS PO Take by  mouth 2 (two) times daily.   fish oil-omega-3 fatty acids 1000 MG capsule Take 2 g by mouth daily.   fluticasone 50 MCG/ACT nasal spray Commonly known as:  FLONASE Use as directed   furosemide 40 MG tablet Commonly known as:  LASIX Take 20 mg by mouth daily.   hydrALAZINE 50 MG tablet Commonly known as:  APRESOLINE Take 100 mg by mouth 2 (two) times daily.   isosorbide mononitrate 120 MG 24 hr tablet Commonly known as:  IMDUR Take 2 tablets (240 mg total) by mouth daily.   LANTUS SOLOSTAR 100 UNIT/ML Solostar Pen Generic drug:  Insulin Glargine as directed.   latanoprost 0.005 % ophthalmic solution Commonly known as:  XALATAN   loratadine 10 MG tablet Commonly known as:  CLARITIN Take 10 mg by mouth daily.   losartan 50 MG tablet Commonly known as:  COZAAR Take 1 tablet (50 mg total) by mouth 2 (two) times daily.   montelukast 10 MG tablet Commonly known as:  SINGULAIR Take 10 mg by mouth at bedtime.   multivitamin tablet Take 1 tablet by mouth daily.   nitroGLYCERIN 0.4 MG SL tablet Commonly known as:  NITROSTAT Place 1 tablet (0.4 mg total) under the tongue every 5 (five) minutes as needed for chest pain.   omeprazole 20 MG capsule Commonly known as:  PRILOSEC Take 20 mg by mouth daily.   timolol 0.5 % ophthalmic solution  Commonly known as:  TIMOPTIC Place 1 drop into the right eye daily.       Past Medical History:  Diagnosis Date  . CHF (congestive heart failure) (HCC)    Diastolic. Precipitated by Actos  . Chronic kidney disease    mild-moderate. GFR was 35 in 02/12  . Coronary artery disease 02/2010   abnormal nuclear stress test with mild ischemia in apical anterolateral wall.   . DM (diabetes mellitus) ([place])   . Dyslipidemia   . Hypertension   . Nasal polyps   . Unspecified hypertensive heart disease without heart failure     Past Surgical History:  Procedure Laterality Date  . APPENDECTOMY  1951  . CATARACT EXTRACTION, BILATERAL      . CHOLECYSTECTOMY    . GLAUCOMA SURGERY  10/29/2013  . hysterectomy - unknown type    . NECK MASS EXCISION     non cancerous  . TONSILLECTOMY      Allergies  Allergen Reactions  . Actos [Pioglitazone Hydrochloride]   . Combigan [Brimonidine Tartrate-Timolol] Other (See Comments)    Eye redness and itchiness, blurred vision  . Dorzolamide Other (See Comments)    Increased eye pressure, blurred vision  . Felodipine Er   . Hctz [Hydrochlorothiazide]   . Lisinopril   . Monopril [Fosinopril Sodium] Cough  . Norvasc [Amlodipine Besylate] Hives  . Penicillins   . Plendil [Felodipine]   . Clonidine Derivatives Rash    Review of systems negative except as noted in HPI / PMHx or noted below:  Review of Systems  Constitutional: Negative.   HENT: Negative.   Eyes: Negative.   Respiratory: Negative.   Cardiovascular: Negative.   Gastrointestinal: Negative.   Genitourinary: Negative.   Musculoskeletal: Negative.   Skin: Negative.   Neurological: Negative.   Endo/Heme/Allergies: Negative.   Psychiatric/Behavioral: Negative.      Objective:   Vitals:   10/06/15 1034  BP: 132/60  Pulse: 72  Resp: 20          Physical Exam  Constitutional: She is well-developed, well-nourished, and in no distress.  HENT:  Head: Normocephalic.  Right Ear: External ear normal.  Left Ear: External ear normal.  Nose: Mucosal edema: Very dry nasal mucosa.  Mouth/Throat: Pale mucous membranes: Very dry oral mucosa.    Diagnostics: None   Assessment and Plan:   1. Other allergic rhinitis     1. Use nasal saline at least three times per day  2. Change Flonase to QNASL 80 one puff each nostril 1 time per day (sample)  3. DISCONTINUE Azelastine spray and loratadine tablet  4. Continue montelukast 10 mg daily    5. Return to clinic in 4 weeks  7. Obtain fall flu vaccine   Kelly Ruiz still remains with rather significant upper airway symptoms and I think it would be beneficial for  her to discontinue antihistamines given the very dry nasal and oral mucosa that is in existence at this point in time. We'll stop her nasal antihistamine and her oral antihistamine while continuing to have her use montelukast and I've given her a different form of nasal steroid and she will use nasal saline a few times a day as well. I will regroup with her in 4 weeks to assess her response to therapy. Some of her symptomatology may be secondary to atrophic rhinitis.  Allena Katz, MD [place]

## 2015-10-09 DIAGNOSIS — L089 Local infection of the skin and subcutaneous tissue, unspecified: Secondary | ICD-10-CM | POA: Diagnosis not present

## 2015-10-27 DIAGNOSIS — I129 Hypertensive chronic kidney disease with stage 1 through stage 4 chronic kidney disease, or unspecified chronic kidney disease: Secondary | ICD-10-CM | POA: Diagnosis not present

## 2015-10-27 DIAGNOSIS — N189 Chronic kidney disease, unspecified: Secondary | ICD-10-CM | POA: Diagnosis not present

## 2015-10-27 DIAGNOSIS — N179 Acute kidney failure, unspecified: Secondary | ICD-10-CM | POA: Diagnosis not present

## 2015-10-27 DIAGNOSIS — E860 Dehydration: Secondary | ICD-10-CM | POA: Diagnosis not present

## 2015-10-27 DIAGNOSIS — E1122 Type 2 diabetes mellitus with diabetic chronic kidney disease: Secondary | ICD-10-CM | POA: Diagnosis not present

## 2015-11-07 ENCOUNTER — Ambulatory Visit (INDEPENDENT_AMBULATORY_CARE_PROVIDER_SITE_OTHER): Payer: Medicare Other | Admitting: Allergy and Immunology

## 2015-11-07 ENCOUNTER — Encounter: Payer: Self-pay | Admitting: Allergy and Immunology

## 2015-11-07 VITALS — BP 164/70 | HR 64 | Resp 20

## 2015-11-07 DIAGNOSIS — I208 Other forms of angina pectoris: Secondary | ICD-10-CM | POA: Diagnosis not present

## 2015-11-07 DIAGNOSIS — M3500 Sicca syndrome, unspecified: Secondary | ICD-10-CM | POA: Diagnosis not present

## 2015-11-07 DIAGNOSIS — J3089 Other allergic rhinitis: Secondary | ICD-10-CM

## 2015-11-07 MED ORDER — BECLOMETHASONE DIPROPIONATE 80 MCG/ACT NA AERS: 1.0000 | INHALATION_SPRAY | Freq: Every day | NASAL | 5 refills | Status: DC

## 2015-11-07 NOTE — Progress Notes (Signed)
Follow-up Note  Referring Provider: Ronita Hipps, MD Primary Provider: Ronita Hipps, MD Date of Office Visit: 11/07/2015  Subjective:   Kelly Ruiz (DOB: 07/26/1923) is a 82 y.o. female who returns to the [facility] on 11/07/2015 in re-evaluation of the following:  HPI: L[place] returns to this clinic in evaluation of her allergic rhinitis. When I last saw her in this clinic in September I gave her a Qnasl sample and asked her to use nasal saline multiple times per day and we eliminated her oral antihistamine and nasal antihistamine and she is better. She can breathe much better and she is sleeping much better because her nose does not get congested. However, she is still extremely dry in her mouth.    Medication List      acetaminophen 500 MG tablet Commonly known as:  TYLENOL Take 500 mg by mouth 2 (two) times daily.   amitriptyline 25 MG tablet Commonly known as:  ELAVIL Take 1 tablet by mouth daily.   aspirin 81 MG tablet Take 81 mg by mouth daily.   atorvastatin 10 MG tablet Commonly known as:  LIPITOR Take 10 mg by mouth daily.   azelastine 0.1 % nasal spray Commonly known as:  ASTELIN Can use two sprays in each nostril twice daily if needed for runny nose and itching.   brinzolamide 1 % ophthalmic suspension Commonly known as:  AZOPT 1 drop 2 (two) times daily.   calcium-vitamin D 500-200 MG-UNIT tablet Take 1 tablet by mouth 2 (two) times daily with a meal.   carvedilol 12.5 MG tablet Commonly known as:  COREG Take 1 tablet (12.5 mg total) by mouth 2 (two) times daily with a meal.   cyanocobalamin 1000 MCG tablet Take 2,000 mcg by mouth daily.   dicyclomine 10 MG capsule Commonly known as:  BENTYL Take 10 mg by mouth 4 (four) times daily -  before meals and at bedtime.   dorzolamide-timolol 22.3-6.8 MG/ML ophthalmic solution Commonly known as:  COSOPT Place 1 drop into the right eye 2 (two) times daily.   EYE VITAMINS PO Take  by mouth 2 (two) times daily.   fish oil-omega-3 fatty acids 1000 MG capsule Take 2 g by mouth daily.   furosemide 40 MG tablet Commonly known as:  LASIX Take 20 mg by mouth daily.   hydrALAZINE 50 MG tablet Commonly known as:  APRESOLINE Take 100 mg by mouth 2 (two) times daily.   isosorbide mononitrate 120 MG 24 hr tablet Commonly known as:  IMDUR Take 2 tablets (240 mg total) by mouth daily.   LANTUS SOLOSTAR 100 UNIT/ML Solostar Pen Generic drug:  Insulin Glargine as directed.   latanoprost 0.005 % ophthalmic solution Commonly known as:  XALATAN   loratadine 10 MG tablet Commonly known as:  CLARITIN Take 10 mg by mouth daily.   losartan 50 MG tablet Commonly known as:  COZAAR Take 1 tablet (50 mg total) by mouth 2 (two) times daily.   montelukast 10 MG tablet Commonly known as:  SINGULAIR Take 10 mg by mouth at bedtime.   multivitamin tablet Take 1 tablet by mouth daily.   nitroGLYCERIN 0.4 MG SL tablet Commonly known as:  NITROSTAT Place 1 tablet (0.4 mg total) under the tongue every 5 (five) minutes as needed for chest pain.   omeprazole 20 MG capsule Commonly known as:  PRILOSEC Take 20 mg by mouth daily.   QNASL 80 MCG/ACT Aers Generic drug:  Beclomethasone Dipropionate Place 1  puff into the nose daily.   timolol 0.5 % ophthalmic solution Commonly known as:  TIMOPTIC Place 1 drop into the right eye daily.       Past Medical History:  Diagnosis Date  . CHF (congestive heart failure) (HCC)    Diastolic. Precipitated by Actos  . Chronic kidney disease    mild-moderate. GFR was 35 in 02/12  . Coronary artery disease 02/2010   abnormal nuclear stress test with mild ischemia in apical anterolateral wall.   . DM (diabetes mellitus) ([place])   . Dyslipidemia   . Hypertension   . Nasal polyps   . Unspecified hypertensive heart disease without heart failure     Past Surgical History:  Procedure Laterality Date  . APPENDECTOMY  1951  . CATARACT  EXTRACTION, BILATERAL    . CHOLECYSTECTOMY    . GLAUCOMA SURGERY  10/29/2013  . hysterectomy - unknown type    . NECK MASS EXCISION     non cancerous  . TONSILLECTOMY      Allergies  Allergen Reactions  . Actos [Pioglitazone Hydrochloride]   . Combigan [Brimonidine Tartrate-Timolol] Other (See Comments)    Eye redness and itchiness, blurred vision  . Dorzolamide Other (See Comments)    Increased eye pressure, blurred vision  . Felodipine Er   . Hctz [Hydrochlorothiazide]   . Lisinopril   . Monopril [Fosinopril Sodium] Cough  . Norvasc [Amlodipine Besylate] Hives  . Penicillins   . Plendil [Felodipine]   . Clonidine Derivatives Rash    Review of systems negative except as noted in HPI / PMHx or noted below:  Review of Systems  Constitutional: Negative.   HENT: Negative.   Eyes: Negative.   Respiratory: Negative.   Cardiovascular: Negative.        Had another visit to emergency room for dehydration. Apparently third episode in 2017. Seeing cardiologist for manipulation of diuretic dose.  Gastrointestinal: Negative.   Genitourinary: Negative.   Musculoskeletal: Negative.   Skin: Negative.   Neurological: Negative.   Endo/Heme/Allergies: Negative.   Psychiatric/Behavioral: Negative.      Objective:   Vitals:   11/07/15 1025  BP: (!) 164/70  Pulse: 64  Resp: 20          Physical Exam  Constitutional: She is well-developed, well-nourished, and in no distress.  HENT:  Head: Normocephalic.  Right Ear: Tympanic membrane, external ear and ear canal normal.  Left Ear: Tympanic membrane, external ear and ear canal normal.  Nose: Nose normal. No mucosal edema or rhinorrhea.  Mouth/Throat: Uvula is midline, oropharynx is clear and moist and mucous membranes are normal. No oropharyngeal exudate.  Very dry nasal and oral mucosa  Eyes: Conjunctivae are normal.  Neck: Trachea normal. No tracheal tenderness present. No tracheal deviation present. No thyromegaly present.   Cardiovascular: Normal rate, regular rhythm, S1 normal, S2 normal and normal heart sounds.   No murmur heard. Pulmonary/Chest: Breath sounds normal. No stridor. No respiratory distress. She has no wheezes. She has no rales.  Musculoskeletal: She exhibits no edema.  Lymphadenopathy:       Head (right side): No tonsillar adenopathy present.       Head (left side): No tonsillar adenopathy present.    She has no cervical adenopathy.  Neurological: She is alert.  Skin: No rash noted. She is not diaphoretic. No erythema. Nails show no clubbing.  Psychiatric: Mood and affect normal.    Diagnostics: None   Assessment and Plan:   1. Other allergic rhinitis   2.  Sicca syndrome ([place])     1. Use nasal saline at least three times per day  2. Continue QNASL 80 one puff each nostril 1 time per day (sample and prescription)  3. Consider decreasing amitriptyline to 12.5 mg (half tablet) at bedtime  4. Continue montelukast 10 mg daily    5. Return to clinic in 12 weeks   I made a recommendation to [place] that she consider decreasing her amitriptyline to half her current dose in an attempt to eliminate some of the dryness of her nasal and oral mucosa. Because she has had a benefit of using Qnasl we will continue to have her use this medication. I will see her back in this clinic in 12 weeks or earlier if there is a problem.  Allena Katz, MD [place]

## 2015-11-07 NOTE — Patient Instructions (Addendum)
  1. Use nasal saline at least three times per day  2. Continue QNASL 80 one puff each nostril 1 time per day (sample and prescription)  3. Consider decreasing amitriptyline to 12.5 mg (half tablet) at bedtime  4. Continue montelukast 10 mg daily    5. Return to clinic in 12 weeks

## 2015-11-08 ENCOUNTER — Encounter: Payer: Self-pay | Admitting: *Deleted

## 2015-11-08 ENCOUNTER — Ambulatory Visit (INDEPENDENT_AMBULATORY_CARE_PROVIDER_SITE_OTHER): Payer: Medicare Other | Admitting: Cardiology

## 2015-11-08 ENCOUNTER — Encounter: Payer: Self-pay | Admitting: Cardiology

## 2015-11-08 VITALS — BP 128/60 | HR 84 | Ht <= 58 in | Wt 151.0 lb

## 2015-11-08 DIAGNOSIS — N183 Chronic kidney disease, stage 3 unspecified: Secondary | ICD-10-CM

## 2015-11-08 DIAGNOSIS — D513 Other dietary vitamin B12 deficiency anemia: Secondary | ICD-10-CM | POA: Diagnosis not present

## 2015-11-08 DIAGNOSIS — E1129 Type 2 diabetes mellitus with other diabetic kidney complication: Secondary | ICD-10-CM | POA: Insufficient documentation

## 2015-11-08 DIAGNOSIS — I10 Essential (primary) hypertension: Secondary | ICD-10-CM

## 2015-11-08 DIAGNOSIS — Z961 Presence of intraocular lens: Secondary | ICD-10-CM | POA: Diagnosis not present

## 2015-11-08 DIAGNOSIS — IMO0001 Reserved for inherently not codable concepts without codable children: Secondary | ICD-10-CM

## 2015-11-08 DIAGNOSIS — R079 Chest pain, unspecified: Secondary | ICD-10-CM

## 2015-11-08 DIAGNOSIS — H04123 Dry eye syndrome of bilateral lacrimal glands: Secondary | ICD-10-CM | POA: Diagnosis not present

## 2015-11-08 DIAGNOSIS — I208 Other forms of angina pectoris: Secondary | ICD-10-CM

## 2015-11-08 DIAGNOSIS — Z794 Long term (current) use of insulin: Secondary | ICD-10-CM

## 2015-11-08 DIAGNOSIS — I5032 Chronic diastolic (congestive) heart failure: Secondary | ICD-10-CM

## 2015-11-08 DIAGNOSIS — H401133 Primary open-angle glaucoma, bilateral, severe stage: Secondary | ICD-10-CM | POA: Diagnosis not present

## 2015-11-08 DIAGNOSIS — E119 Type 2 diabetes mellitus without complications: Secondary | ICD-10-CM | POA: Diagnosis not present

## 2015-11-08 DIAGNOSIS — H462 Nutritional optic neuropathy: Secondary | ICD-10-CM | POA: Diagnosis not present

## 2015-11-08 DIAGNOSIS — N184 Chronic kidney disease, stage 4 (severe): Secondary | ICD-10-CM | POA: Insufficient documentation

## 2015-11-08 HISTORY — DX: Chest pain, unspecified: R07.9

## 2015-11-08 NOTE — Patient Instructions (Addendum)
Medication Instructions:  Your physician has recommended you make the following change in your medication:  1. STOP the Lasix  Labwork: None ordered  Testing/Procedures: Your physician has requested that you have a [place].  For further information please visit [email or web address]. Please follow instruction sheet, as given.    Follow-Up: Your physician recommends that you schedule a follow-up appointment in: 2 MONTHS WITH DR. HOCHREIN   Any Other Special Instructions Will Be Listed Below (If Applicable).  Pharmacologic Stress Electrocardiogram A pharmacologic stress electrocardiogram is a heart (cardiac) test that uses nuclear imaging to evaluate the blood supply to your heart. This test may also be called a pharmacologic stress electrocardiography. Pharmacologic means that a medicine is used to increase your heart rate and blood pressure.  This stress test is done to find areas of poor blood flow to the heart by determining the extent of coronary artery disease (CAD). Some people exercise on a treadmill, which naturally increases the blood flow to the heart. For those people unable to exercise on a treadmill, a medicine is used. This medicine stimulates your heart and will cause your heart to beat harder and more quickly, as if you were exercising.  Pharmacologic stress tests can help determine:  The adequacy of blood flow to your heart during increased levels of activity in order to clear you for discharge home.  The extent of coronary artery blockage caused by CAD.  Your prognosis if you have suffered a heart attack.  The effectiveness of cardiac procedures done, such as an angioplasty, which can increase the circulation in your coronary arteries.  Causes of chest pain or pressure. LET [facility] CARE PROVIDER KNOW ABOUT:  Any allergies you have.  All medicines you are taking, including vitamins, herbs, eye drops, creams, and over-the-counter  medicines.  Previous problems you or members of your family have had with the use of anesthetics.  Any blood disorders you have.  Previous surgeries you have had.  Medical conditions you have.  Possibility of pregnancy, if this applies.  If you are currently breastfeeding. RISKS AND COMPLICATIONS Generally, this is a safe procedure. However, as with any procedure, complications can occur. Possible complications include:  You develop pain or pressure in the following areas:  Chest.  Jaw or neck.  Between your shoulder blades.  Radiating down your left arm.  Headache.  Dizziness or light-headedness.  Shortness of breath.  Increased or irregular heartbeat.  Low blood pressure.  Nausea or vomiting.  Flushing.  Redness going up the arm and slight pain during injection of medicine.  Heart attack (rare). BEFORE THE PROCEDURE   Avoid all forms of caffeine for 24 hours before your test or as directed by your health care provider. This includes coffee, tea (even decaffeinated tea), caffeinated sodas, chocolate, cocoa, and certain pain medicines.  Follow your health care provider's instructions regarding eating and drinking before the test.  Take your medicines as directed at regular times with water unless instructed otherwise. Exceptions may include:  If you have diabetes, ask how you are to take your insulin or pills. It is common to adjust insulin dosing the morning of the test.  If you are taking beta-blocker medicines, it is important to talk to your health care provider about these medicines well before the date of your test. Taking beta-blocker medicines may interfere with the test. In some cases, these medicines need to be changed or stopped 24 hours or more before the test.  If you wear  a nitroglycerin patch, it may need to be removed prior to the test. Ask your health care provider if the patch should be removed before the test.  If you use an inhaler for any  breathing condition, bring it with you to the test.  If you are an outpatient, bring a snack so you can eat right after the stress phase of the test.  Do not smoke for 4 hours prior to the test or as directed by your health care provider.  Do not apply lotions, powders, creams, or oils on your chest prior to the test.  Wear comfortable shoes and clothing. Let your health care provider know if you were unable to complete or follow the preparations for your test. PROCEDURE   Multiple patches (electrodes) will be put on your chest. If needed, small areas of your chest may be shaved to get better contact with the electrodes. Once the electrodes are attached to your body, multiple wires will be attached to the electrodes, and your heart rate will be monitored.  An IV access will be started. A nuclear trace (isotope) is given. The isotope may be given intravenously, or it may be swallowed. Nuclear refers to several types of radioactive isotopes, and the nuclear isotope lights up the arteries so that the nuclear images are clear. The isotope is absorbed by your body. This results in low radiation exposure.  A resting nuclear image is taken to show how your heart functions at rest.  A medicine is given through the IV access.  A second scan is done about 1 hour after the medicine injection and determines how your heart functions under stress.  During this stress phase, you will be connected to an electrocardiogram machine. Your blood pressure and oxygen levels will be monitored. AFTER THE PROCEDURE   Your heart rate and blood pressure will be monitored after the test.  You may return to your normal schedule, including diet,activities, and medicines, unless your health care provider tells you otherwise.   This information is not intended to replace advice given to you by your health care provider. Make sure you discuss any questions you have with your health care provider.   Document Released:  05/20/2008 Document Revised: 01/06/2013 Document Reviewed: 09/08/2012 Elsevier Interactive Patient Education [employer].    If you need a refill on your cardiac medications before your next appointment, please call your pharmacy.

## 2015-11-08 NOTE — Assessment & Plan Note (Signed)
She is followed by a nephrologist in [facility]- [name]

## 2015-11-08 NOTE — Assessment & Plan Note (Signed)
Labile- recent admission to [facility] with "dehydration"

## 2015-11-08 NOTE — Assessment & Plan Note (Signed)
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

## 2015-11-08 NOTE — Progress Notes (Addendum)
11/08/2015 Kelly Ruiz   [birth year]  [id number]  Primary Physician Kelly Hipps, MD Primary Cardiologist: Dr Kelly Ruiz  HPI:  82 y/o female here with her son to be seen after an ED admission to [name] 10/27/15 with "dehydration". This is the second time in 6 months this has happened. Unfortunately I don't have access to those records or Dr Kelly Ruiz office notes from April and Aug this year. He apparently increased the pt's Cozaar at the last office visit. The patient had CHF once in the past felt to be related to Actos and diastolic dysfunction. She had a remote abnormal Myoview but her most recent Myoview Jan 2016 was normal. Her last echo was in 2012. The pt's son says she was hydrated with IVF in the ED but her medications weren't adjusted. She denies any unusual dyspnea. She did have 5 minutes of chest pain a few nights ago with radiation to her Lt arm. She says she almost took a NTG but it went away. She denies any lower extremity edema. Records indicate an allergic reaction to Norvasc "hives". She was told in [name] her kidney function was getting worse and she has a follow up with her Nephrologist in Nov. In reviewing her labs from [name] it appears her SCr is 1.4 to 1.9.    Current Outpatient Prescriptions  Medication Sig Dispense Refill  . acetaminophen (TYLENOL) 500 MG tablet Take 500 mg by mouth 2 (two) times daily.    [name] amitriptyline (ELAVIL) 25 MG tablet Take 12.5 tablets by mouth daily.     [name] aspirin 81 MG tablet Take 81 mg by mouth daily.      [name] atorvastatin (LIPITOR) 10 MG tablet Take 10 mg by mouth daily.    . Beclomethasone Dipropionate (QNASL) 80 MCG/ACT AERS Place 1 spray into both nostrils daily. 8.7 g 5  . brinzolamide (AZOPT) 1 % ophthalmic suspension 1 drop 2 (two) times daily.    . Calcium Carbonate-Vitamin D (CALCIUM-VITAMIN D) 500-200 MG-UNIT per tablet Take 1 tablet by mouth 2 (two) times daily with a meal.    . carvedilol (COREG) 12.5  MG tablet Take 1 tablet (12.5 mg total) by mouth 2 (two) times daily with a meal. 60 tablet 6  . cyanocobalamin 1000 MCG tablet Take 2,000 mcg by mouth daily.     [name] dicyclomine (BENTYL) 10 MG capsule Take 10 mg by mouth 4 (four) times daily -  before meals and at bedtime.      . dorzolamide-timolol (COSOPT) 22.3-6.8 MG/ML ophthalmic solution Place 1 drop into the right eye 2 (two) times daily.    . fish oil-omega-3 fatty acids 1000 MG capsule Take 2 g by mouth daily.    . hydrALAZINE (APRESOLINE) 50 MG tablet Take 100 mg by mouth 2 (two) times daily.     . isosorbide mononitrate (IMDUR) 120 MG 24 hr tablet Take 2 tablets (240 mg total) by mouth daily. 180 tablet 3  . LANTUS SOLOSTAR 100 UNIT/ML injection as directed.    . latanoprost (XALATAN) 0.005 % ophthalmic solution     . losartan (COZAAR) 50 MG tablet Take 1 tablet (50 mg total) by mouth 2 (two) times daily. 180 tablet 3  . montelukast (SINGULAIR) 10 MG tablet Take 10 mg by mouth at bedtime.    . Multiple Vitamin (MULTIVITAMIN) tablet Take 1 tablet by mouth daily.    . Multiple Vitamins-Minerals (EYE VITAMINS PO) Take by mouth 2 (two) times daily.    . nitroGLYCERIN (  NITROSTAT) 0.4 MG SL tablet Place 1 tablet (0.4 mg total) under the tongue every 5 (five) minutes as needed for chest pain. 25 tablet 3  . omeprazole (PRILOSEC) 20 MG capsule Take 20 mg by mouth daily.    . timolol (TIMOPTIC) 0.5 % ophthalmic solution Place 1 drop into the right eye daily.      No current facility-administered medications for this visit.     Allergies  Allergen Reactions  . Actos [Pioglitazone Hydrochloride]   . Combigan [Brimonidine Tartrate-Timolol] Other (See Comments)    Eye redness and itchiness, blurred vision  . Dorzolamide Other (See Comments)    Increased eye pressure, blurred vision  . Felodipine Er   . Hctz [Hydrochlorothiazide]   . Lisinopril   . Monopril [Fosinopril Sodium] Cough  . Norvasc [Amlodipine Besylate] Hives  . Penicillins     . Plendil [Felodipine]   . Clonidine Derivatives Rash    Social History   Social History  . Marital status: Married    Spouse name: N/A  . Number of children: 3  . Years of education: N/A   Occupational History  . retired    Social History Main Topics  . Smoking status: Never Smoker  . Smokeless tobacco: Never Used  . Alcohol use No  . Drug use: No  . Sexual activity: Not on file   Other Topics Concern  . Not on file   Social History Narrative  . No narrative on file     Review of Systems: General: negative for chills, fever, night sweats or weight changes.  Cardiovascular: negative for chest pain, dyspnea on exertion, edema, orthopnea, palpitations, paroxysmal nocturnal dyspnea or shortness of breath Dermatological: negative for rash Respiratory: negative for cough or wheezing Urologic: negative for hematuria Abdominal: negative for nausea, vomiting, diarrhea, bright red blood per rectum, melena, or hematemesis Neurologic: negative for visual changes, syncope, or dizziness All other systems reviewed and are otherwise negative except as noted above.    Blood pressure 128/60, pulse 84, height 4\' 10"  (1.473 m), weight 151 lb (68.5 kg).  General appearance: alert, cooperative and no distress Neck: no JVD and LCA bruit Lungs: Ruiz to auscultation bilaterally Heart: regular rate and rhythm Extremities: no edema Skin: Skin color, texture, turgor normal. No rashes or lesions Neurologic: Grossly normal   ASSESSMENT AND PLAN:   Chronic renal insufficiency, stage III (moderate) She is followed by a nephrologist in [name]- Dr Kelly Ruiz  Essential hypertension Labile- recent admission to [facility] with "dehydration"   Chest pain with moderate risk of acute coronary syndrome She had a mildly abnormal Myoview in the past though her last study Jan 2016 was normal. She had 5 minutes of chest pain at rest a few days ago.   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 No recent CHF. Her B/P is labile and we may have to accept a higher B/P if she can tolerate it as she is approaching maximum tolerable medical therapy.  She did have chest pain worrisome for angina.  I ordered a Myoview but told her we would only consider proceeding to cath if it was high risk with her advanced age and CRI.  If she has more chest pain I will add a Nitrate, this is the only episide she has had recently. She'll have follow up labs done in a week  or so by her Nephrologist, Dr Kelly Ruiz in two months.   Kerin Ransom PA-C 11/08/2015 2:19 PM   Addendum: Labs from [facility] ED visit 10/27/15 showed Hgb 10.6, SCr 2.40, BUN 83. Her B/P was running a little high in the office today,  would like to try and keep her on Cozaar 50 mg BID as options are limited for medical Rx. Continue current plan- Lasix stopped, f/u with her nephrologist in two weeks as scheduled.   Kerin Ransom PA-C 11/08/2015 4:05 PM

## 2015-11-08 NOTE — Assessment & Plan Note (Signed)
She had a mildly abnormal Myoview in the past though her last study Jan 2016 was normal. She had 5 minutes of chest pain at rest a few days ago.

## 2015-11-11 ENCOUNTER — Telehealth (HOSPITAL_COMMUNITY): Payer: Self-pay

## 2015-11-11 NOTE — Telephone Encounter (Signed)
Encounter complete. 

## 2015-11-16 ENCOUNTER — Ambulatory Visit (HOSPITAL_COMMUNITY)
Admission: RE | Admit: 2015-11-16 | Discharge: 2015-11-16 | Disposition: A | Discharge status: Home / Self Care | Payer: Medicare Other | Source: Ambulatory Visit | Attending: Cardiology | Admitting: Cardiology

## 2015-11-16 DIAGNOSIS — R0609 Other forms of dyspnea: Secondary | ICD-10-CM | POA: Insufficient documentation

## 2015-11-16 DIAGNOSIS — I129 Hypertensive chronic kidney disease with stage 1 through stage 4 chronic kidney disease, or unspecified chronic kidney disease: Secondary | ICD-10-CM | POA: Insufficient documentation

## 2015-11-16 DIAGNOSIS — N189 Chronic kidney disease, unspecified: Secondary | ICD-10-CM | POA: Diagnosis not present

## 2015-11-16 DIAGNOSIS — M79602 Pain in left arm: Secondary | ICD-10-CM | POA: Insufficient documentation

## 2015-11-16 DIAGNOSIS — I779 Disorder of arteries and arterioles, unspecified: Secondary | ICD-10-CM | POA: Insufficient documentation

## 2015-11-16 DIAGNOSIS — R5383 Other fatigue: Secondary | ICD-10-CM | POA: Diagnosis not present

## 2015-11-16 DIAGNOSIS — R079 Chest pain, unspecified: Secondary | ICD-10-CM | POA: Diagnosis not present

## 2015-11-16 DIAGNOSIS — R9439 Abnormal result of other cardiovascular function study: Secondary | ICD-10-CM | POA: Diagnosis not present

## 2015-11-16 DIAGNOSIS — Z8249 Family history of ischemic heart disease and other diseases of the circulatory system: Secondary | ICD-10-CM | POA: Diagnosis not present

## 2015-11-16 DIAGNOSIS — E1122 Type 2 diabetes mellitus with diabetic chronic kidney disease: Secondary | ICD-10-CM | POA: Insufficient documentation

## 2015-11-16 LAB — MYOCARDIAL PERFUSION IMAGING
LV dias vol: 92 mL (ref 46–106)
LV sys vol: 36 mL
Peak HR: 96 {beats}/min
Rest HR: 69 {beats}/min
SDS: 6
SRS: 6
SSS: 8
TID: 1.19

## 2015-11-16 MED ORDER — AMINOPHYLLINE 25 MG/ML IV SOLN
75.0000 mg | Freq: Once | INTRAVENOUS | Status: AC
  Administered 2015-11-16: 75 mg via INTRAVENOUS

## 2015-11-16 MED ORDER — TECHNETIUM TC 99M TETROFOSMIN IV KIT
30.9000 | PACK | Freq: Once | INTRAVENOUS | Status: AC | PRN
  Administered 2015-11-16: 30.9 via INTRAVENOUS
  Filled 2015-11-16: qty 31

## 2015-11-16 MED ORDER — TECHNETIUM TC 99M TETROFOSMIN IV KIT
10.6000 | PACK | Freq: Once | INTRAVENOUS | Status: AC | PRN
  Administered 2015-11-16: 10.6 via INTRAVENOUS
  Filled 2015-11-16: qty 11

## 2015-11-16 MED ORDER — REGADENOSON 0.4 MG/5ML IV SOLN
0.4000 mg | Freq: Once | INTRAVENOUS | Status: AC
  Administered 2015-11-16: 0.4 mg via INTRAVENOUS

## 2015-11-17 DIAGNOSIS — E119 Type 2 diabetes mellitus without complications: Secondary | ICD-10-CM | POA: Diagnosis not present

## 2015-11-17 DIAGNOSIS — I10 Essential (primary) hypertension: Secondary | ICD-10-CM | POA: Diagnosis not present

## 2015-11-21 DIAGNOSIS — N183 Chronic kidney disease, stage 3 (moderate): Secondary | ICD-10-CM | POA: Diagnosis not present

## 2015-11-28 ENCOUNTER — Encounter: Payer: Self-pay | Admitting: Student

## 2015-11-28 ENCOUNTER — Telehealth: Payer: Self-pay | Admitting: Cardiology

## 2015-11-28 ENCOUNTER — Ambulatory Visit (INDEPENDENT_AMBULATORY_CARE_PROVIDER_SITE_OTHER): Payer: Medicare Other | Admitting: Student

## 2015-11-28 VITALS — BP 184/76 | HR 68 | Ht 60.0 in | Wt 158.0 lb

## 2015-11-28 DIAGNOSIS — I5032 Chronic diastolic (congestive) heart failure: Secondary | ICD-10-CM | POA: Diagnosis not present

## 2015-11-28 DIAGNOSIS — N183 Chronic kidney disease, stage 3 unspecified: Secondary | ICD-10-CM

## 2015-11-28 DIAGNOSIS — I208 Other forms of angina pectoris: Secondary | ICD-10-CM

## 2015-11-28 DIAGNOSIS — I10 Essential (primary) hypertension: Secondary | ICD-10-CM

## 2015-11-28 MED ORDER — FUROSEMIDE 20 MG PO TABS: ORAL_TABLET | ORAL | 3 refills | Status: DC

## 2015-11-28 MED ORDER — FUROSEMIDE 20 MG PO TABS: 20.0000 mg | ORAL_TABLET | Freq: Every day | ORAL | 3 refills | Status: DC | PRN

## 2015-11-28 NOTE — Progress Notes (Signed)
Cardiology Office Note    Date:  11/28/2015   ID:  Kelly Ruiz, DOB 23-Sep-1923, MRN [id number]  PCP:  Ronita Hipps, MD  Cardiologist:  Dr. Percival Spanish  Chief Complaint  Patient presents with  . Shortness of Breath    pt son states it has gotten worse since having her stress test on November 16, 2015    History of Present Illness:    Kelly Ruiz is a 82 y.o. female with past medical history of chronic diastolic CHF, IDDM, HTN, HLD, ad Stage 3 CKD who presents to the office today for follow-up of progressive dyspnea.   She was recently seen by Kerin Ransom, PA-C on 11/08/2015 for hospital follow-up. She had been admitted for dehydration (creatinine peaked at 2.40) and her Lasix was stopped. She reported having episodes of chest discomfort lasting for 5 minute intervals. A nuclear stress test was performed which showed an EF of 55-65% with apical inferior hypokinesis and a small defect of mild severity in the apex location but no ischemia was identified. Overall, the study was low-risk.   In talking with the patient, she reports worsening lower extremity edema and dyspnea with exertion for the past 2 weeks, ever since her Lasix was discontinued and the stress test was performed. Weight has increased from 151 lbs to 158 lbs since 11/08/2015. She denies any orthopnea or PND, sleeping with 1 pillow at night. No further episodes of chest discomfort.   She has been admitted for dehydration twice over the past year. Had been on Lasix 20mg  daily prior to her recent hospitalization. She follows with [facility] for her Stage 3 CKD. Recent labs performed on 11/21/2015 showed an improved creatinine of 1.42 (baseline 1.4 - 1.5). Has an appointment with her Nephrologist tomorrow.   She does check her BP on a daily basis at home. She brings in a sheet today of recent readings with SBP ranging from the 130's - 160's. Her son reports she was recently seen by her PCP for hypoglycemia and  hypotension, where at that time her Losartan was decreased from 50mg  BID to 25mg  BID. She has been taking 25mg  in the AM and 50mg  in the PM over the past week.  Her BP is elevated today at 184/76, but she reports it is "always high at the doctor's office". She checks it at home and at pharmacies, obtaining readings as above.    Past Medical History:  Diagnosis Date  . CHF (congestive heart failure) (HCC)    Diastolic. Precipitated by Actos  . Chronic kidney disease    mild-moderate. GFR was 35 in 02/12  . Coronary artery disease 02/2010   abnormal nuclear stress test with mild ischemia in apical anterolateral wall.   . DM (diabetes mellitus) ([place])   . Dyslipidemia   . Hypertension   . Nasal polyps   . Unspecified hypertensive heart disease without heart failure     Past Surgical History:  Procedure Laterality Date  . APPENDECTOMY  1951  . CATARACT EXTRACTION, BILATERAL    . CHOLECYSTECTOMY    . GLAUCOMA SURGERY  10/29/2013  . hysterectomy - unknown type    . NECK MASS EXCISION     non cancerous  . TONSILLECTOMY      Current Medications: Outpatient Medications Prior to Visit  Medication Sig Dispense Refill  . acetaminophen (TYLENOL) 500 MG tablet Take 500 mg by mouth 2 (two) times daily.    [name] amitriptyline (ELAVIL) 25 MG tablet Take 12.5 tablets by mouth daily.     [name]  aspirin 81 MG tablet Take 81 mg by mouth daily.      [name] atorvastatin (LIPITOR) 10 MG tablet Take 10 mg by mouth daily.    . Beclomethasone Dipropionate (QNASL) 80 MCG/ACT AERS Place 1 spray into both nostrils daily. 8.7 g 5  . brinzolamide (AZOPT) 1 % ophthalmic suspension 1 drop 2 (two) times daily.    . Calcium Carbonate-Vitamin D (CALCIUM-VITAMIN D) 500-200 MG-UNIT per tablet Take 1 tablet by mouth 2 (two) times daily with a meal.    . carvedilol (COREG) 12.5 MG tablet Take 1 tablet (12.5 mg total) by mouth 2 (two) times daily with a meal. 60 tablet 6  . cyanocobalamin 1000 MCG tablet Take 2,000 mcg by mouth  daily.     [name] dicyclomine (BENTYL) 10 MG capsule Take 10 mg by mouth 4 (four) times daily -  before meals and at bedtime.      . dorzolamide-timolol (COSOPT) 22.3-6.8 MG/ML ophthalmic solution Place 1 drop into the right eye 2 (two) times daily.    . fish oil-omega-3 fatty acids 1000 MG capsule Take 2 g by mouth daily.    . hydrALAZINE (APRESOLINE) 50 MG tablet Take 100 mg by mouth 2 (two) times daily.     . isosorbide mononitrate (IMDUR) 120 MG 24 hr tablet Take 2 tablets (240 mg total) by mouth daily. 180 tablet 3  . LANTUS SOLOSTAR 100 UNIT/ML injection Inject 15 Units into the skin as directed.     . latanoprost (XALATAN) 0.005 % ophthalmic solution     . montelukast (SINGULAIR) 10 MG tablet Take 10 mg by mouth at bedtime.    . Multiple Vitamin (MULTIVITAMIN) tablet Take 1 tablet by mouth daily.    . Multiple Vitamins-Minerals (EYE VITAMINS PO) Take by mouth 2 (two) times daily.    . nitroGLYCERIN (NITROSTAT) 0.4 MG SL tablet Place 1 tablet (0.4 mg total) under the tongue every 5 (five) minutes as needed for chest pain. 25 tablet 3  . omeprazole (PRILOSEC) 20 MG capsule Take 20 mg by mouth daily.    . timolol (TIMOPTIC) 0.5 % ophthalmic solution Place 1 drop into the right eye daily.     [name] losartan (COZAAR) 50 MG tablet Take 1 tablet (50 mg total) by mouth 2 (two) times daily. 180 tablet 3   No facility-administered medications prior to visit.      Allergies:   Actos [pioglitazone hydrochloride]; Combigan [brimonidine tartrate-timolol]; Dorzolamide; Felodipine er; Hctz [hydrochlorothiazide]; Lisinopril; Monopril [fosinopril sodium]; Norvasc [amlodipine besylate]; Penicillins; Plendil [felodipine]; and Clonidine derivatives   Social History   Social History  . Marital status: Married    Spouse name: N/A  . Number of children: 3  . Years of education: N/A   Occupational History  . retired    Social History Main Topics  . Smoking status: Never Smoker  . Smokeless tobacco: Never Used   . Alcohol use No  . Drug use: No  . Sexual activity: Not Asked   Other Topics Concern  . None   Social History Narrative  . None     Family History:  The patient's family history includes Allergic rhinitis in her mother; Asthma in her mother; Congestive Heart Failure in her mother; Stroke in her mother.   Review of Systems:   Please see the history of present illness.     General:  No chills, fever, night sweats or weight changes.  Cardiovascular:  No chest pain, orthopnea, palpitations, paroxysmal nocturnal dyspnea. Positive for dyspnea with exertion  and edema.  Dermatological: No rash, lesions/masses Respiratory: No cough, dyspnea Urologic: No hematuria, dysuria Abdominal:   No nausea, vomiting, diarrhea, bright red blood per rectum, melena, or hematemesis Neurologic:  No visual changes, wkns, changes in mental status. All other systems reviewed and are otherwise negative except as noted above.   Physical Exam:    VS:  BP (!) 184/76   Pulse 68   Ht 5' (1.524 m)   Wt 158 lb (71.7 kg)   SpO2 100%   BMI 30.86 kg/m    General: Pleasant elderly Caucasian female appearing in no acute distress. Head: Normocephalic, atraumatic, sclera non-icteric, no xanthomas, nares are without discharge.  Neck: No carotid bruits. JVD not elevated.  Lungs: Respirations regular and unlabored, without wheezes or rales.  Heart: Regular rate and rhythm. No S3 or S4.  No murmur, no rubs, or gallops appreciated. Abdomen: Soft, non-tender, non-distended with normoactive bowel sounds. No hepatomegaly. No rebound/guarding. No obvious abdominal masses. Msk:  Strength and tone appear normal for age. No joint deformities or effusions. Extremities: No clubbing or cyanosis. 1+ pitting edema along RLE, 2+ along LLE.  Distal pedal pulses are 2+ bilaterally. Neuro: Alert and oriented X 3. Moves all extremities spontaneously. No focal deficits noted. Psych:  Responds to questions appropriately with a normal  affect. Skin: No rashes or lesions noted  Wt Readings from Last 3 Encounters:  11/28/15 158 lb (71.7 kg)  11/16/15 151 lb (68.5 kg)  11/08/15 151 lb (68.5 kg)     Studies/Labs Reviewed:   EKG:  EKG is not ordered today.  Recent Labs: 04/04/2015: BUN 40; Creat 1.45; Hemoglobin 9.9; Platelets 188; Potassium 4.2; Sodium 142   Lipid Panel No results found for: CHOL, TRIG, HDL, CHOLHDL, VLDL, LDLCALC, LDLDIRECT  Additional studies/ records that were reviewed today include:   NST: 11/16/2015  The left ventricular ejection fraction is normal (55-65%).  Nuclear stress EF: 61%. Apical inferior hypokinesis.  There was no ST segment deviation noted during stress.  Defect 1: There is a small defect of mild severity present in the apex location.  This is a low risk study. No ischemia identified.    Assessment:    1. Chronic diastolic congestive heart failure ([place])   2. Essential hypertension   3. Chronic renal insufficiency, stage III (moderate)      Plan:   In order of problems listed above:  1. Chronic Diastolic CHF - the patient was recently admitted for dehydration and her PTA Lasix was discontinued. In the past few weeks, she has experienced worsening dyspnea with exertion and lower extremity edema.  - weight up from 151 lbs to 158 lbs since her last office visit. She has significant lower extremity edema on exam, however lungs are clear and JVD does not appear to be elevated.  - she ambulated around the office today while O2 saturations were checked. These never dropped below 91% on Room Air.  - a delicate fluid balance needs to be achieved as she has a tendency to become dehydrated when on PO Lasix but without this she develops significant volume overload. I will restart Lasix 20mg  daily with instructions to continue with this until she is back to her baseline weight of 151 lbs. Once this is achieved, she will only take Lasix on a PRN basis (3 lb weight gain overnight or >5  lbs in one week). This was thoroughly reviewed with the patient and her son. She will follow-up with Dr. Percival Spanish in 4 weeks or sooner  if needed.   2. Essential HTN - BP elevated to 184/76 during today's visit. Patient reports having "white coat hypertension". She brings a list of home readings with her today with SBP ranging from 130's - 160's, with occasional readings in the 170's.  - continue Hydralazine, Coreg, Imdur, and Losartan (dosing recently increased from 25mg  BID to 25mg  in AM and 50mg  in PM). Losartan will likely need to be increased back to 50mg  BID if BP remains elevated (patient's son reports dosing was initially reduced by her PCP secondary to hypotension). Will restart Lasix as above.   3. Stage 3 CKD - baseline creatinine 1.4-1.5. Peaked at 2.40 during recent admission for dehydration.  - improved to 1.42 on 11/21/2015. Has appointment with her Nephrologist tomorrow (Dr. Audie Clear).    Medication Adjustments/Labs and Tests Ordered: Current medicines are reviewed at length with the patient today.  Concerns regarding medicines are outlined above.  Medication changes, Labs and Tests ordered today are listed in the Patient Instructions below. Patient Instructions  Medication Instructions:  START Lasix 20mg  take 1 tablet by mouth once a day as needed, Start this medication today take 1 tablet every day until you lose the 7lbs that you have gained. Then ONLY take Lasix if 3lbs of weight is gained over night or 5lbs gained in 1 week.  Labwork: None  Testing/Procedures: None   Follow-Up: Your physician recommends that you schedule a follow-up appointment in: [place]   Any Other Special Instructions Will Be Listed Below (If Applicable).   If you need a refill on your cardiac medications before your next appointment, please call your pharmacy.     Arna Medici, [place]  11/28/2015 6:29 PM    [address], [place], [place]  [address] Phone: [phone]; Fax: [phone]  [address] [place], [place] [address] Phone: [phone]

## 2015-11-28 NOTE — Telephone Encounter (Signed)
Good to know :)  Thank you

## 2015-11-28 NOTE — Telephone Encounter (Signed)
New Message  Pt c/o Shortness Of Breath: STAT if SOB developed within the last 24 hours or pt is noticeably SOB on the phone  1. Are you currently SOB (can you hear that pt is SOB on the phone)? No, pts son on phone  2. How long have you been experiencing SOB? Since stress test 11/16/2015  3. Are you SOB when sitting or when up moving around? Yes  4. Are you currently experiencing any other symptoms? Per pts son, "No, has been SOB since stress test but has always been SOB. Also during stress test we had to give pt oxygen."  Pt has been scheduled to see MD-Hochrein 01/10/2016- reschedule to 12/23/2015, offered PA due to wanting to get pt in some time this week but pts son declined Pa>  Please f/u

## 2015-11-28 NOTE — Patient Instructions (Addendum)
Medication Instructions:  START Lasix 20mg  take 1 tablet by mouth once a day as needed, Start this medication today take 1 tablet every day until you lose the 7lbs that you have gained. Then ONLY take Lasix if 3lbs of weight is gained over night or 5lbs gained in 1 week.  Labwork: None  Testing/Procedures: None   Follow-Up: Your physician recommends that you schedule a follow-up appointment in: [place]   Any Other Special Instructions Will Be Listed Below (If Applicable).     If you need a refill on your cardiac medications before your next appointment, please call your pharmacy.

## 2015-11-28 NOTE — Telephone Encounter (Signed)
Spoke to son of patient. Notes she was seen recently on 10/25 by Lurena Joiner, had myoview on 11/1, she is to f/u w Dr. Percival Spanish on 12/8 C/o dyspnea which has been occurring for a while, noted that the patient required O2 to finish stress test. She is getting SOB when walking short distances. No worse today than it has been.  I discussed w son who feels after talking with me that he would be agreeable to have her evaluated in office by PA - I made him aware Dr. Percival Spanish does not have any upcoming availability and after this AM is out of office this week.  Put on Hao's schedule for 2pm today. Son would like to see if feasible to get home O2 orders, so aware we may need to check this in office. Also amenable to pulm referral, or whatever PA will advise. Routing note for review.

## 2015-11-30 DIAGNOSIS — I5032 Chronic diastolic (congestive) heart failure: Secondary | ICD-10-CM | POA: Diagnosis not present

## 2015-11-30 DIAGNOSIS — I129 Hypertensive chronic kidney disease with stage 1 through stage 4 chronic kidney disease, or unspecified chronic kidney disease: Secondary | ICD-10-CM | POA: Diagnosis not present

## 2015-11-30 DIAGNOSIS — E1122 Type 2 diabetes mellitus with diabetic chronic kidney disease: Secondary | ICD-10-CM | POA: Diagnosis not present

## 2015-11-30 DIAGNOSIS — N183 Chronic kidney disease, stage 3 (moderate): Secondary | ICD-10-CM | POA: Diagnosis not present

## 2015-12-23 ENCOUNTER — Ambulatory Visit: Payer: Medicare Other | Admitting: Cardiology

## 2016-01-09 NOTE — Progress Notes (Signed)
HPI The patient presents for follow up of congestive heart failure with a preserved ejection fraction.  In January of 2016 we did a stress test which demonstrated no ischemia or infarct. She had more chest pain last year and she had another perfusion study in Nov.  This was negative for ischemia and she had a normal EF.  When I last saw her I increased her Cozaar as her BP was consistently elevated.  She has had CKD and this has been watched closely.  She had Lasix stopped as she had some issues with dehydration.    Since I last saw her she has done OK.  She started taking her Lasix QOD.  She stopped having angina after she stopped vacuuming and mopping.     Allergies  Allergen Reactions  . Actos [Pioglitazone Hydrochloride]   . Felodipine Er   . Hctz [Hydrochlorothiazide]   . Lisinopril   . Penicillins   . Plendil [Felodipine]   . Clonidine Derivatives Rash  . Combigan [Brimonidine Tartrate-Timolol] Other (See Comments)    Eye redness and itchiness, blurred vision  . Dorzolamide Other (See Comments)    Increased eye pressure, blurred vision  . Monopril [Fosinopril Sodium] Cough  . Norvasc [Amlodipine Besylate] Hives    Current Outpatient Prescriptions  Medication Sig Dispense Refill  . acetaminophen (TYLENOL) 500 MG tablet Take 500 mg by mouth 2 (two) times daily.    [name] amitriptyline (ELAVIL) 25 MG tablet Take 12.5 tablets by mouth daily.     [name] aspirin 81 MG tablet Take 81 mg by mouth daily.      [name] atorvastatin (LIPITOR) 10 MG tablet Take 10 mg by mouth daily.    . brinzolamide (AZOPT) 1 % ophthalmic suspension 1 drop 2 (two) times daily.    . Calcium Carbonate-Vitamin D (CALCIUM-VITAMIN D) 500-200 MG-UNIT per tablet Take 1 tablet by mouth 2 (two) times daily with a meal.    . carvedilol (COREG) 12.5 MG tablet Take 1 tablet (12.5 mg total) by mouth 2 (two) times daily with a meal. 60 tablet 6  . cyanocobalamin 1000 MCG tablet Take 2,000 mcg by mouth daily.     [name] dicyclomine  (BENTYL) 10 MG capsule Take 10 mg by mouth as needed.     . dorzolamide-timolol (COSOPT) 22.3-6.8 MG/ML ophthalmic solution Place 1 drop into the right eye 2 (two) times daily.    . fish oil-omega-3 fatty acids 1000 MG capsule Take 2 g by mouth daily.    . furosemide (LASIX) 20 MG tablet TAKE 1 TABLET AS NEEDED FOR WEIGHT GAIN OF 3 LBS OVER NIGHT OR 5 LBS IN 1 WEEK (Patient taking differently: Take 20 mg by mouth every other day. ) 30 tablet 3  . hydrALAZINE (APRESOLINE) 50 MG tablet Take 100 mg by mouth 2 (two) times daily.     . isosorbide mononitrate (IMDUR) 120 MG 24 hr tablet Take 2 tablets (240 mg total) by mouth daily. 180 tablet 3  . LANTUS SOLOSTAR 100 UNIT/ML injection Inject 15 Units into the skin as directed.     . latanoprost (XALATAN) 0.005 % ophthalmic solution     . losartan (COZAAR) 50 MG tablet Take by mouth daily. Pt takes 50 mg at bedtime and 25 mg in the morning    . montelukast (SINGULAIR) 10 MG tablet Take 10 mg by mouth at bedtime.    . Multiple Vitamin (MULTIVITAMIN) tablet Take 1 tablet by mouth daily.    . Multiple Vitamins-Minerals (EYE VITAMINS  PO) Take by mouth 2 (two) times daily.    . nitroGLYCERIN (NITROSTAT) 0.4 MG SL tablet Place 1 tablet (0.4 mg total) under the tongue every 5 (five) minutes as needed for chest pain. 25 tablet 3  . omeprazole (PRILOSEC) 20 MG capsule Take 20 mg by mouth daily.    . timolol (TIMOPTIC) 0.5 % ophthalmic solution Place 1 drop into the right eye daily.      No current facility-administered medications for this visit.     Past Medical History:  Diagnosis Date  . CHF (congestive heart failure) (HCC)    Diastolic. Precipitated by Actos  . Chronic kidney disease    mild-moderate. GFR was 35 in 02/12  . Coronary artery disease 02/2010   abnormal nuclear stress test with mild ischemia in apical anterolateral wall.   . DM (diabetes mellitus) ([place])   . Dyslipidemia   . Hypertension   . Nasal polyps   . Unspecified hypertensive  heart disease without heart failure     Past Surgical History:  Procedure Laterality Date  . APPENDECTOMY  1951  . CATARACT EXTRACTION, BILATERAL    . CHOLECYSTECTOMY    . GLAUCOMA SURGERY  10/29/2013  . hysterectomy - unknown type    . NECK MASS EXCISION     non cancerous  . TONSILLECTOMY      ROS:   As stated in the HPI and negative for all other systems.  PHYSICAL EXAM BP (!) 160/68 (BP Location: Left Arm, Patient Position: Sitting, Cuff Size: Normal)   Pulse 64   Ht 5' (1.524 m)   Wt 148 lb (67.1 kg)   SpO2 98%   BMI 28.90 kg/m  GENERAL:  Well appearing NECK:  No jugular venous distention, waveform within normal limits, carotid upstroke brisk and symmetric, soft right bruits, no thyromegaly LUNGS:  Clear to auscultation bilaterally BACK:  No CVA tenderness CHEST:  Unremarkable HEART:  PMI not displaced or sustained,S1 and S2 within normal limits, no S3, no S4, no clicks, no rubs, brief soft apical systolic murmur, no murmurs ABD:  Flat, positive bowel sounds normal in frequency in pitch, no bruits, no rebound, no guarding, no midline pulsatile mass, no hepatomegaly, no splenomegaly EXT:  2 plus pulses throughout, mild edema in the ankles, no cyanosis no clubbing NEURO:  Nonfocal.    ASSESSMENT AND PLAN  CHF (congestive heart failure) - She seems to be euvolemic. No change in therapy is indicated.  Coronary artery disease -  She wants conservative therapy.   No change in therapy is indicated.   Hypertension -  She will continue the meds as listed.  I will check a BMET.   Bruit - In March she had a Doppler with 40 - 59% stenosis in the right.   We will schedule follow up for this March.    CKD - As above.

## 2016-01-10 ENCOUNTER — Ambulatory Visit: Payer: Medicare Other | Admitting: Cardiology

## 2016-01-10 ENCOUNTER — Ambulatory Visit (INDEPENDENT_AMBULATORY_CARE_PROVIDER_SITE_OTHER): Payer: Medicare Other | Admitting: Cardiology

## 2016-01-10 ENCOUNTER — Encounter: Payer: Self-pay | Admitting: Cardiology

## 2016-01-10 VITALS — BP 160/68 | HR 64 | Ht 60.0 in | Wt 148.0 lb

## 2016-01-10 DIAGNOSIS — I6521 Occlusion and stenosis of right carotid artery: Secondary | ICD-10-CM

## 2016-01-10 DIAGNOSIS — Z79899 Other long term (current) drug therapy: Secondary | ICD-10-CM

## 2016-01-10 DIAGNOSIS — I208 Other forms of angina pectoris: Secondary | ICD-10-CM

## 2016-01-10 DIAGNOSIS — I2510 Atherosclerotic heart disease of native coronary artery without angina pectoris: Secondary | ICD-10-CM

## 2016-01-10 LAB — BASIC METABOLIC PANEL
BUN: 41 mg/dL — ABNORMAL HIGH (ref 7–25)
CALCIUM: 9 mg/dL (ref 8.6–10.4)
CO2: 26 mmol/L (ref 20–31)
CREATININE: 1.59 mg/dL — AB (ref 0.60–0.88)
Chloride: 107 mmol/L (ref 98–110)
Glucose, Bld: 80 mg/dL (ref 65–99)
Potassium: 4.9 mmol/L (ref 3.5–5.3)
Sodium: 141 mmol/L (ref 135–146)

## 2016-01-10 NOTE — Patient Instructions (Signed)
Medication Instructions:  Continue current medications  Labwork: BMP today  Testing/Procedures: Schedule Carotid Doppler same day as APP appointment  Follow-Up: Your physician recommends that you schedule a follow-up appointment in: 3 Months with Kerin Ransom   Any Other Special Instructions Will Be Listed Below (If Applicable).             [place]   If you need a refill on your cardiac medications before your next appointment, please call your pharmacy.C

## 2016-01-30 ENCOUNTER — Encounter: Payer: Self-pay | Admitting: Allergy and Immunology

## 2016-01-30 ENCOUNTER — Ambulatory Visit (INDEPENDENT_AMBULATORY_CARE_PROVIDER_SITE_OTHER): Payer: Medicare Other | Admitting: Allergy and Immunology

## 2016-01-30 VITALS — BP 118/76 | HR 88 | Resp 14

## 2016-01-30 DIAGNOSIS — M3500 Sicca syndrome, unspecified: Secondary | ICD-10-CM | POA: Diagnosis not present

## 2016-01-30 DIAGNOSIS — J3089 Other allergic rhinitis: Secondary | ICD-10-CM | POA: Diagnosis not present

## 2016-01-30 NOTE — Progress Notes (Signed)
Follow-up Note  Referring Provider: Ronita Hipps, MD Primary Provider: Ronita Hipps, MD Date of Office Visit: 01/30/2016  Subjective:   Kelly Ruiz (DOB: [birth year]) is a 83 y.o. female who returns to the [facility] on 01/30/2016 in re-evaluation of the following:  HPI: [name] returns to this clinic in reevaluation of her allergic rhinitis. She's been using a combination of Qnasl and montelukast and her nose is really doing much better. Her sleeping has improved as a result of this treatment. However, she still has this dry mouth issue completely unrelated to the use of these medicines. She's had this for a prolonged period in time. She did attempt to taper down her amitriptyline to see if this helped her dryness but unfortunately developed significant insomnia with this manipulation.  Allergies as of 01/30/2016      Reactions   Actos [pioglitazone Hydrochloride]    Felodipine Er    Hctz [hydrochlorothiazide]    Lisinopril    Penicillins    Plendil [felodipine]    Clonidine Derivatives Rash   Combigan [brimonidine Tartrate-timolol] Other (See Comments)   Eye redness and itchiness, blurred vision   Dorzolamide Other (See Comments)   Increased eye pressure, blurred vision   Monopril [fosinopril Sodium] Cough   Norvasc [amlodipine Besylate] Hives      Medication List      acetaminophen 500 MG tablet Commonly known as:  TYLENOL Take 500 mg by mouth 2 (two) times daily.   amitriptyline 25 MG tablet Commonly known as:  ELAVIL Take 12.5 tablets by mouth daily.   aspirin 81 MG tablet Take 81 mg by mouth daily.   atorvastatin 10 MG tablet Commonly known as:  LIPITOR Take 10 mg by mouth daily.   brinzolamide 1 % ophthalmic suspension Commonly known as:  AZOPT 1 drop 2 (two) times daily.   calcium-vitamin D 500-200 MG-UNIT tablet Take 1 tablet by mouth 2 (two) times daily with a meal.   carvedilol 12.5 MG tablet Commonly known as:  COREG Take 1  tablet (12.5 mg total) by mouth 2 (two) times daily with a meal.   cyanocobalamin 1000 MCG tablet Take 2,000 mcg by mouth daily.   dicyclomine 10 MG capsule Commonly known as:  BENTYL Take 10 mg by mouth as needed.   EYE VITAMINS PO Take by mouth 2 (two) times daily.   fish oil-omega-3 fatty acids 1000 MG capsule Take 2 g by mouth daily.   furosemide 20 MG tablet Commonly known as:  LASIX TAKE 1 TABLET AS NEEDED FOR WEIGHT GAIN OF 3 LBS OVER NIGHT OR 5 LBS IN 1 WEEK   hydrALAZINE 50 MG tablet Commonly known as:  APRESOLINE Take 100 mg by mouth 2 (two) times daily.   isosorbide mononitrate 120 MG 24 hr tablet Commonly known as:  IMDUR Take 2 tablets (240 mg total) by mouth daily.   LANTUS SOLOSTAR 100 UNIT/ML Solostar Pen Generic drug:  Insulin Glargine Inject 15 Units into the skin as directed.   latanoprost 0.005 % ophthalmic solution Commonly known as:  XALATAN   losartan 50 MG tablet Commonly known as:  COZAAR Take by mouth daily. Pt takes 50 mg at bedtime and 25 mg in the morning   montelukast 10 MG tablet Commonly known as:  SINGULAIR Take 10 mg by mouth at bedtime.   multivitamin tablet Take 1 tablet by mouth daily.   nitroGLYCERIN 0.4 MG SL tablet Commonly known as:  NITROSTAT Place 1 tablet (0.4 mg total)  under the tongue every 5 (five) minutes as needed for chest pain.   omeprazole 20 MG capsule Commonly known as:  PRILOSEC Take 20 mg by mouth daily.   QNASL 80 MCG/ACT Aers Generic drug:  Beclomethasone Dipropionate Place 1 puff into the nose daily.   timolol 0.5 % ophthalmic solution Commonly known as:  TIMOPTIC Place 1 drop into the right eye daily.       Past Medical History:  Diagnosis Date  . CHF (congestive heart failure) (HCC)    Diastolic. Precipitated by Actos  . Chronic kidney disease    mild-moderate. GFR was 35 in 02/12  . Coronary artery disease 02/2010   abnormal nuclear stress test with mild ischemia in apical  anterolateral wall.   . DM (diabetes mellitus) ([place])   . Dyslipidemia   . Hypertension   . Nasal polyps   . Unspecified hypertensive heart disease without heart failure     Past Surgical History:  Procedure Laterality Date  . APPENDECTOMY  1951  . CATARACT EXTRACTION, BILATERAL    . CHOLECYSTECTOMY    . GLAUCOMA SURGERY  10/29/2013  . hysterectomy - unknown type    . NECK MASS EXCISION     non cancerous  . TONSILLECTOMY      Review of systems negative except as noted in HPI / PMHx or noted below:  Review of Systems  Constitutional: Negative.   HENT: Negative.   Eyes: Negative.   Respiratory: Negative.   Cardiovascular: Negative.   Gastrointestinal: Negative.   Genitourinary: Negative.   Musculoskeletal: Negative.   Skin: Negative.   Neurological: Negative.   Endo/Heme/Allergies: Negative.   Psychiatric/Behavioral: Negative.      Objective:   Vitals:   01/30/16 1106  BP: 118/76  Pulse: 88  Resp: 14          Physical Exam  Constitutional: She is well-developed, well-nourished, and in no distress.  HENT:  Head: Normocephalic.  Right Ear: Tympanic membrane, external ear and ear canal normal.  Left Ear: Tympanic membrane, external ear and ear canal normal.  Nose: Nose normal. No mucosal edema or rhinorrhea.  Mouth/Throat: Uvula is midline, oropharynx is clear and moist and mucous membranes are normal. No oropharyngeal exudate.  Very dry oral mucosa  Eyes: Conjunctivae are normal.  Neck: Trachea normal. No tracheal tenderness present. No tracheal deviation present. No thyromegaly present.  Cardiovascular: Normal rate, regular rhythm, S1 normal, S2 normal and normal heart sounds.   No murmur heard. Pulmonary/Chest: Breath sounds normal. No stridor. No respiratory distress. She has no wheezes. She has no rales.  Musculoskeletal: She exhibits no edema.  Lymphadenopathy:       Head (right side): No tonsillar adenopathy present.       Head (left side): No  tonsillar adenopathy present.    She has no cervical adenopathy.  Neurological: She is alert. Gait normal.  Skin: No rash noted. She is not diaphoretic. No erythema. Nails show no clubbing.  Psychiatric: Mood and affect normal.    Diagnostics: None  Assessment and Plan:   1. Other allergic rhinitis   2. Sicca syndrome ([place])     1. Use nasal saline at least three times per day  2. Continue QNASL 80 one puff each nostril 1 time per day (samples)  3. Continue montelukast 10 mg daily    4. Discuss with Dr. Helene Kelp about possibility of using an alternate for amitriptyline to see if this medication is responsible for dry mouth.  5. Return to clinic in  6 months   Lillette Boxer appears to be doing quite well from a nasal standpoint and I'm going to continue to have her use Qnasl and montelukast at this point in time. I do not know if amitriptyline is responsible for some of her mucosal dryness and it may be that she has sicca syndrome unrelated to the use of this medication. However, the only way to tell if amitriptyline is contributing to this issue is to stop this medication and I have encouraged Irisa to discuss with Dr. Helene Kelp the possibility of using an alternative for her insomnia. I will see her back in this clinic in 6 months or earlier if there is a problem.  Allena Katz, MD [place]

## 2016-01-30 NOTE — Patient Instructions (Signed)
  1. Use nasal saline at least three times per day  2. Continue QNASL 80 one puff each nostril 1 time per day (samples)  3. Continue montelukast 10 mg daily    4. Discuss with Dr. Helene Kelp about possibility of using an alternate for amitriptyline to see if this medication is responsible for dry mouth.  5. Return to clinic in 6 months

## 2016-02-07 DIAGNOSIS — H401133 Primary open-angle glaucoma, bilateral, severe stage: Secondary | ICD-10-CM | POA: Diagnosis not present

## 2016-02-07 DIAGNOSIS — H04123 Dry eye syndrome of bilateral lacrimal glands: Secondary | ICD-10-CM | POA: Diagnosis not present

## 2016-02-07 DIAGNOSIS — H348322 Tributary (branch) retinal vein occlusion, left eye, stable: Secondary | ICD-10-CM | POA: Diagnosis not present

## 2016-02-07 DIAGNOSIS — E119 Type 2 diabetes mellitus without complications: Secondary | ICD-10-CM | POA: Diagnosis not present

## 2016-02-07 DIAGNOSIS — H353132 Nonexudative age-related macular degeneration, bilateral, intermediate dry stage: Secondary | ICD-10-CM | POA: Diagnosis not present

## 2016-02-07 DIAGNOSIS — Z961 Presence of intraocular lens: Secondary | ICD-10-CM | POA: Diagnosis not present

## 2016-02-14 DIAGNOSIS — E7800 Pure hypercholesterolemia, unspecified: Secondary | ICD-10-CM | POA: Diagnosis not present

## 2016-02-14 DIAGNOSIS — Z79899 Other long term (current) drug therapy: Secondary | ICD-10-CM | POA: Diagnosis not present

## 2016-02-14 DIAGNOSIS — I10 Essential (primary) hypertension: Secondary | ICD-10-CM | POA: Diagnosis not present

## 2016-02-14 DIAGNOSIS — E119 Type 2 diabetes mellitus without complications: Secondary | ICD-10-CM | POA: Diagnosis not present

## 2016-02-14 DIAGNOSIS — E785 Hyperlipidemia, unspecified: Secondary | ICD-10-CM | POA: Diagnosis not present

## 2016-02-20 ENCOUNTER — Telehealth: Payer: Self-pay | Admitting: Cardiology

## 2016-02-20 DIAGNOSIS — R0601 Orthopnea: Secondary | ICD-10-CM | POA: Diagnosis not present

## 2016-02-20 DIAGNOSIS — R220 Localized swelling, mass and lump, head: Secondary | ICD-10-CM | POA: Diagnosis not present

## 2016-02-20 NOTE — Telephone Encounter (Signed)
Discussed w son. He notes pt was put on steroids by PCP today due to her inflamed nasal polyps - notes she is not a candidate for surgery on this. He voiced they have a f/u w PCP in 3 weeks to check back on this, will see outcome of this and plan to f/u accordingly - he voiced he is OK to keep March appt w Lurena Joiner and for carotid US as scheduled.  Aware to call again if we may address anything further.

## 2016-02-20 NOTE — Telephone Encounter (Signed)
New message   Pt son calling-Pt request that he receive a phone call before lunch time. -He needs to know if they need to take her into the ER.  Pt c/o Shortness Of Breath: STAT if SOB developed within the last 24 hours or pt is noticeably SOB on the phone  1. Are you currently SOB (can you hear that pt is SOB on the phone)? Son verbalizing pt is currently sob.  2. How long have you been experiencing SOB? Son verbalized since last night  3. Are you SOB when sitting or when up moving around? Constant. Son verbalized she wasn't able to sleep last night because of polyps-which they are aware that she has, but she has to breathe through her mouth and it makes her heart work harder.    4. Are you currently experiencing any other symptoms? No.

## 2016-02-20 NOTE — Telephone Encounter (Signed)
Spoke to caller. Voices that patient was able to be seen in her PCP office this AM. He is going to call back later, states he wants to see if pt's carotid US and f/u w Lurena Joiner can be moved up for sooner date. Will also call w/ outcome of PCP visit today.

## 2016-02-20 NOTE — Telephone Encounter (Signed)
Follow up   Pt son verbalized that he is returning call for rn

## 2016-02-22 ENCOUNTER — Telehealth: Payer: Self-pay | Admitting: Cardiology

## 2016-02-22 NOTE — Telephone Encounter (Signed)
Records received from [facility] family practice for apt on 04/10/16 with [facility] PA, records placed in [facility] [facility] schedule for 04/10/16.CN

## 2016-03-02 ENCOUNTER — Telehealth: Payer: Self-pay

## 2016-03-02 NOTE — Telephone Encounter (Signed)
Please informed That She Can Use Qnasl Twice a Day and We Can See What Type of Response She Develops over the Course of the Next Several Weeks

## 2016-03-02 NOTE — Telephone Encounter (Signed)
Pts son called. She is using QNasl 1 puff each nostril at bedtime. Still waking up very stuffy. Wants to know if she can use Qnasl twice daily. Has inoperable polyps in nose.

## 2016-03-02 NOTE — Telephone Encounter (Signed)
Advised patients son of instructions.

## 2016-03-07 DIAGNOSIS — R0602 Shortness of breath: Secondary | ICD-10-CM | POA: Diagnosis not present

## 2016-03-07 DIAGNOSIS — R0600 Dyspnea, unspecified: Secondary | ICD-10-CM | POA: Diagnosis not present

## 2016-03-07 DIAGNOSIS — I110 Hypertensive heart disease with heart failure: Secondary | ICD-10-CM | POA: Diagnosis not present

## 2016-03-07 DIAGNOSIS — I509 Heart failure, unspecified: Secondary | ICD-10-CM | POA: Diagnosis not present

## 2016-03-12 DIAGNOSIS — R2689 Other abnormalities of gait and mobility: Secondary | ICD-10-CM | POA: Diagnosis not present

## 2016-03-12 DIAGNOSIS — R0600 Dyspnea, unspecified: Secondary | ICD-10-CM | POA: Diagnosis not present

## 2016-03-12 DIAGNOSIS — R220 Localized swelling, mass and lump, head: Secondary | ICD-10-CM | POA: Diagnosis not present

## 2016-03-19 ENCOUNTER — Telehealth: Payer: Self-pay | Admitting: *Deleted

## 2016-03-19 NOTE — Telephone Encounter (Signed)
I will call [employer] to see why they did not pay & then call her son back

## 2016-03-19 NOTE — Telephone Encounter (Signed)
Pt son would like a return call regarding his mothers bill. Son is on DPR (release of info).

## 2016-03-20 DIAGNOSIS — I509 Heart failure, unspecified: Secondary | ICD-10-CM | POA: Diagnosis not present

## 2016-03-20 DIAGNOSIS — J339 Nasal polyp, unspecified: Secondary | ICD-10-CM | POA: Diagnosis not present

## 2016-03-20 DIAGNOSIS — J3489 Other specified disorders of nose and nasal sinuses: Secondary | ICD-10-CM | POA: Diagnosis not present

## 2016-03-20 DIAGNOSIS — I10 Essential (primary) hypertension: Secondary | ICD-10-CM | POA: Diagnosis not present

## 2016-03-20 DIAGNOSIS — J329 Chronic sinusitis, unspecified: Secondary | ICD-10-CM | POA: Diagnosis not present

## 2016-03-20 NOTE — Telephone Encounter (Signed)
[employer] does not cover [employer] deductible - called pt's son back  - they have actually already mailed a check - kt

## 2016-03-26 DIAGNOSIS — J329 Chronic sinusitis, unspecified: Secondary | ICD-10-CM | POA: Diagnosis not present

## 2016-03-26 DIAGNOSIS — J019 Acute sinusitis, unspecified: Secondary | ICD-10-CM | POA: Diagnosis not present

## 2016-03-26 DIAGNOSIS — J339 Nasal polyp, unspecified: Secondary | ICD-10-CM | POA: Diagnosis not present

## 2016-03-27 DIAGNOSIS — G4733 Obstructive sleep apnea (adult) (pediatric): Secondary | ICD-10-CM | POA: Diagnosis not present

## 2016-03-29 ENCOUNTER — Telehealth: Payer: Self-pay | Admitting: Cardiology

## 2016-03-29 NOTE — Telephone Encounter (Signed)
Patient son Hoy Morn) calling in regards to a letter that was mailed to Dr. Percival Spanish. He mailed the letter about 1 or 2 week(s) ago The letter gives information about the procedure that patient is scheduled to have on Tuesday and the doctor performing it. He would like to verify that Dr.Hochrein has received it and would like to discuss, thanks.

## 2016-03-29 NOTE — Telephone Encounter (Signed)
Will forward to dr hochrein

## 2016-03-30 NOTE — Telephone Encounter (Signed)
Returned call to patient's son Hoy Morn.He stated he mailed a letter to Dr.Hochrein last week wanting to know if ok for mother to have nasal polyp surgery Tuesday 04/03/16.Stated he would like to know.Message sent to [place] for advice.

## 2016-03-30 NOTE — Telephone Encounter (Signed)
Follow Up Call:  Garry(Son) is calling to follow up about a letter that was sent to Dr. Percival Spanish and that he has not heard anything as of yet . Thanks

## 2016-04-01 NOTE — Telephone Encounter (Signed)
The patient would be at acceptable risk for the planned procedure.  No further work up is needed.  Plase call the patient and send this not to the requesting MD.

## 2016-04-02 NOTE — Telephone Encounter (Signed)
Spoke with pt's son, letting him know she's ok for her procedure, asked if there is someone I need to send it to and she stated no, he's the one needed to know.

## 2016-04-03 DIAGNOSIS — J322 Chronic ethmoidal sinusitis: Secondary | ICD-10-CM | POA: Diagnosis not present

## 2016-04-03 DIAGNOSIS — J3489 Other specified disorders of nose and nasal sinuses: Secondary | ICD-10-CM | POA: Diagnosis not present

## 2016-04-03 DIAGNOSIS — J339 Nasal polyp, unspecified: Secondary | ICD-10-CM | POA: Diagnosis not present

## 2016-04-03 DIAGNOSIS — J323 Chronic sphenoidal sinusitis: Secondary | ICD-10-CM | POA: Diagnosis not present

## 2016-04-05 DIAGNOSIS — Z6829 Body mass index (BMI) 29.0-29.9, adult: Secondary | ICD-10-CM | POA: Diagnosis not present

## 2016-04-05 DIAGNOSIS — L89309 Pressure ulcer of unspecified buttock, unspecified stage: Secondary | ICD-10-CM | POA: Diagnosis not present

## 2016-04-05 DIAGNOSIS — M7989 Other specified soft tissue disorders: Secondary | ICD-10-CM | POA: Diagnosis not present

## 2016-04-07 DIAGNOSIS — Z7982 Long term (current) use of aspirin: Secondary | ICD-10-CM | POA: Diagnosis not present

## 2016-04-07 DIAGNOSIS — E119 Type 2 diabetes mellitus without complications: Secondary | ICD-10-CM | POA: Diagnosis not present

## 2016-04-07 DIAGNOSIS — I10 Essential (primary) hypertension: Secondary | ICD-10-CM | POA: Diagnosis not present

## 2016-04-07 DIAGNOSIS — M7989 Other specified soft tissue disorders: Secondary | ICD-10-CM | POA: Diagnosis not present

## 2016-04-07 DIAGNOSIS — L89312 Pressure ulcer of right buttock, stage 2: Secondary | ICD-10-CM | POA: Diagnosis not present

## 2016-04-10 ENCOUNTER — Ambulatory Visit: Payer: Medicare Other | Admitting: Cardiology

## 2016-04-10 ENCOUNTER — Ambulatory Visit (INDEPENDENT_AMBULATORY_CARE_PROVIDER_SITE_OTHER): Payer: Medicare Other | Admitting: Cardiology

## 2016-04-10 ENCOUNTER — Encounter: Payer: Self-pay | Admitting: Cardiology

## 2016-04-10 ENCOUNTER — Ambulatory Visit (HOSPITAL_COMMUNITY)
Admission: RE | Admit: 2016-04-10 | Discharge: 2016-04-10 | Disposition: A | Discharge status: Home / Self Care | Payer: Medicare Other | Source: Ambulatory Visit | Attending: Cardiology | Admitting: Cardiology

## 2016-04-10 ENCOUNTER — Encounter (HOSPITAL_COMMUNITY): Payer: Medicare Other

## 2016-04-10 VITALS — BP 160/60 | HR 61 | Ht 60.0 in | Wt 149.2 lb

## 2016-04-10 DIAGNOSIS — E119 Type 2 diabetes mellitus without complications: Secondary | ICD-10-CM | POA: Diagnosis not present

## 2016-04-10 DIAGNOSIS — I10 Essential (primary) hypertension: Secondary | ICD-10-CM | POA: Insufficient documentation

## 2016-04-10 DIAGNOSIS — R0600 Dyspnea, unspecified: Secondary | ICD-10-CM | POA: Insufficient documentation

## 2016-04-10 DIAGNOSIS — I6521 Occlusion and stenosis of right carotid artery: Secondary | ICD-10-CM

## 2016-04-10 DIAGNOSIS — IMO0001 Reserved for inherently not codable concepts without codable children: Secondary | ICD-10-CM

## 2016-04-10 DIAGNOSIS — E785 Hyperlipidemia, unspecified: Secondary | ICD-10-CM | POA: Insufficient documentation

## 2016-04-10 DIAGNOSIS — N183 Chronic kidney disease, stage 3 unspecified: Secondary | ICD-10-CM

## 2016-04-10 DIAGNOSIS — J323 Chronic sphenoidal sinusitis: Secondary | ICD-10-CM | POA: Diagnosis not present

## 2016-04-10 DIAGNOSIS — I5032 Chronic diastolic (congestive) heart failure: Secondary | ICD-10-CM | POA: Diagnosis not present

## 2016-04-10 DIAGNOSIS — I6523 Occlusion and stenosis of bilateral carotid arteries: Secondary | ICD-10-CM | POA: Diagnosis not present

## 2016-04-10 DIAGNOSIS — J322 Chronic ethmoidal sinusitis: Secondary | ICD-10-CM | POA: Diagnosis not present

## 2016-04-10 DIAGNOSIS — I2510 Atherosclerotic heart disease of native coronary artery without angina pectoris: Secondary | ICD-10-CM | POA: Insufficient documentation

## 2016-04-10 DIAGNOSIS — E1129 Type 2 diabetes mellitus with other diabetic kidney complication: Secondary | ICD-10-CM

## 2016-04-10 DIAGNOSIS — Z794 Long term (current) use of insulin: Secondary | ICD-10-CM | POA: Diagnosis not present

## 2016-04-10 DIAGNOSIS — I509 Heart failure, unspecified: Secondary | ICD-10-CM | POA: Diagnosis not present

## 2016-04-10 DIAGNOSIS — J339 Nasal polyp, unspecified: Secondary | ICD-10-CM | POA: Diagnosis not present

## 2016-04-10 LAB — BASIC METABOLIC PANEL
BUN: 70 mg/dL — ABNORMAL HIGH (ref 7–25)
CO2: 24 mmol/L (ref 20–31)
Calcium: 9.2 mg/dL (ref 8.6–10.4)
Chloride: 103 mmol/L (ref 98–110)
Creat: 1.79 mg/dL — ABNORMAL HIGH (ref 0.60–0.88)
Glucose, Bld: 136 mg/dL — ABNORMAL HIGH (ref 65–99)
Potassium: 4.3 mmol/L (ref 3.5–5.3)
Sodium: 137 mmol/L (ref 135–146)

## 2016-04-10 LAB — CBC
HCT: 30.6 % — ABNORMAL LOW (ref 35.0–45.0)
Hemoglobin: 9.6 g/dL — ABNORMAL LOW (ref 11.7–15.5)
MCH: 30.1 pg (ref 27.0–33.0)
MCHC: 31.4 g/dL — ABNORMAL LOW (ref 32.0–36.0)
MCV: 95.9 fL (ref 80.0–100.0)
MPV: 9.4 fL (ref 7.5–12.5)
Platelets: 171 10*3/uL (ref 140–400)
RBC: 3.19 MIL/uL — ABNORMAL LOW (ref 3.80–5.10)
RDW: 14.4 % (ref 11.0–15.0)
WBC: 6.8 10*3/uL (ref 3.8–10.8)

## 2016-04-10 NOTE — Assessment & Plan Note (Signed)
Controlled.  

## 2016-04-10 NOTE — Assessment & Plan Note (Signed)
She is again encouraged to see her nephrologist in [facility]. I ordered a BMP and CBC today

## 2016-04-10 NOTE — Assessment & Plan Note (Addendum)
History of Diastolic CHF. Precipitated by Actos. Labile fluid status with problems with orthostatic hypotension when over diuresed. She is currently on Lasix 40 mg daily.

## 2016-04-10 NOTE — Progress Notes (Signed)
04/10/2016 Shann Medal   May 24, 1923  [id number]  Primary Physician Ronita Hipps, MD Primary Cardiologist: Dr Percival Spanish  HPI:  83 y/o female here with her son for follow up.  She has been seen in the ED at [facility] off and on over the past year with SOB and edema, then with "dehydration". Her diuretics were held, then gradually resumed. She is now back on Lasix 40 mg daily. The patient had CHF once in the past felt to be related to Actos and diastolic dysfunction. She had a remote abnormal Myoview but her most recent Myoview Jan 2016 was normal. Her last echo was in 2012. The pt does have DOE but she thinks it may be anxiety related. She keeps records of her B/P and wgts. Her wgt seem to run between 145-150 lbs. She has LE edema but we have been hesitant to push her diuretics with her past history of dehydration on diuretics.    Current Outpatient Prescriptions  Medication Sig Dispense Refill  . acetaminophen (TYLENOL) 500 MG tablet Take 500 mg by mouth 2 (two) times daily.    [name] aspirin 81 MG tablet Take 81 mg by mouth daily.      [name] atorvastatin (LIPITOR) 10 MG tablet Take 10 mg by mouth daily.    . brinzolamide (AZOPT) 1 % ophthalmic suspension 1 drop 2 (two) times daily.    . Calcium Carbonate-Vitamin D (CALCIUM-VITAMIN D) 500-200 MG-UNIT per tablet Take 1 tablet by mouth 2 (two) times daily with a meal.    . carvedilol (COREG) 12.5 MG tablet Take 1 tablet (12.5 mg total) by mouth 2 (two) times daily with a meal. 60 tablet 6  . cyanocobalamin 1000 MCG tablet Take 2,000 mcg by mouth daily.     [name] dicyclomine (BENTYL) 10 MG capsule Take 10 mg by mouth as needed.     . furosemide (LASIX) 20 MG tablet TAKE 1 TABLET AS NEEDED FOR WEIGHT GAIN OF 3 LBS OVER NIGHT OR 5 LBS IN 1 WEEK (Patient taking differently: Take 40 mg by mouth daily. ) 30 tablet 3  . hydrALAZINE (APRESOLINE) 50 MG tablet Take 100 mg by mouth 2 (two) times daily.     . isosorbide mononitrate (IMDUR) 120 MG 24 hr  tablet Take 2 tablets (240 mg total) by mouth daily. 180 tablet 3  . LANTUS SOLOSTAR 100 UNIT/ML injection Inject 15 Units into the skin as directed.     . latanoprost (XALATAN) 0.005 % ophthalmic solution     . losartan (COZAAR) 50 MG tablet Take by mouth daily. Pt takes 50 mg at bedtime and 25 mg in the morning    . montelukast (SINGULAIR) 10 MG tablet Take 10 mg by mouth at bedtime.    . Multiple Vitamin (MULTIVITAMIN) tablet Take 1 tablet by mouth daily.    . Multiple Vitamins-Minerals (EYE VITAMINS PO) Take by mouth 2 (two) times daily.    . nitroGLYCERIN (NITROSTAT) 0.4 MG SL tablet Place 1 tablet (0.4 mg total) under the tongue every 5 (five) minutes as needed for chest pain. 25 tablet 3  . omeprazole (PRILOSEC) 20 MG capsule Take 20 mg by mouth daily.    . timolol (TIMOPTIC) 0.5 % ophthalmic solution Place 1 drop into the right eye 2 (two) times daily.      No current facility-administered medications for this visit.     Allergies  Allergen Reactions  . Actos [Pioglitazone Hydrochloride]   . Felodipine Er   . Hctz [Hydrochlorothiazide]   .  Lisinopril   . Penicillins   . Plendil [Felodipine]   . Clonidine Derivatives Rash  . Combigan [Brimonidine Tartrate-Timolol] Other (See Comments)    Eye redness and itchiness, blurred vision  . Dorzolamide Other (See Comments)    Increased eye pressure, blurred vision  . Monopril [Fosinopril Sodium] Cough  . Norvasc [Amlodipine Besylate] Hives    Past Medical History:  Diagnosis Date  . CHF (congestive heart failure) (HCC)    Diastolic. Precipitated by Actos  . Chronic kidney disease    mild-moderate. GFR was 35 in 02/12  . Coronary artery disease 02/2010   abnormal nuclear stress test with mild ischemia in apical anterolateral wall.   . DM (diabetes mellitus) ([place])   . Dyslipidemia   . Hypertension   . Nasal polyps   . Unspecified hypertensive heart disease without heart failure     Social History   Social History  .  Marital status: Married    Spouse name: N/A  . Number of children: 3  . Years of education: N/A   Occupational History  . retired    Social History Main Topics  . Smoking status: Never Smoker  . Smokeless tobacco: Never Used  . Alcohol use No  . Drug use: No  . Sexual activity: Not on file   Other Topics Concern  . Not on file   Social History Narrative  . No narrative on file     Family History  Problem Relation Age of Onset  . Diabetes    . Hypertension    . Stroke    . Asthma Mother   . Allergic rhinitis Mother   . Congestive Heart Failure Mother   . Stroke Mother      Review of Systems: General: negative for chills, fever, night sweats or weight changes.  Cardiovascular: negative for chest pain, orthopnea, palpitations, paroxysmal nocturnal dyspnea Dermatological: negative for rash Respiratory: negative for cough or wheezing Urologic: negative for hematuria Abdominal: negative for nausea, vomiting, diarrhea, bright red blood per rectum, melena, or hematemesis Neurologic: negative for visual changes, syncope, or dizziness All other systems reviewed and are otherwise negative except as noted above.    Blood pressure (!) 160/60, pulse 61, height 5' (1.524 m), weight 149 lb 3.2 oz (67.7 kg).  General appearance: alert, cooperative, no distress and pale Neck: no carotid bruit and no JVD Lungs: clear to auscultation bilaterally and kyphosis Heart: regular rate and rhythm Extremities: 1+ edema Skin: pale cool dry Neurologic: Grossly normal  EKG NSR, LVH, Q V2  ASSESSMENT AND PLAN:   Chronic renal insufficiency, stage III (moderate) She is followed by a nephrologist in [facility]- [name]  Essential hypertension Labile- recent admission to [facility] with "dehydration"   Chest pain with moderate risk of acute coronary syndrome She had a mildly abnormal Myoview in the past though her last study Jan 2016 was normal. She had 5 minutes of chest pain  at rest a few days ago.   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 I did order an echo and labs (CBC and BMP). I again encouraged her to f/u with her nephrologist in [facility]. F/U with Dr Percival Spanish in 3 months.   Kerin Ransom PA-C 04/10/2016 12:02 PM

## 2016-04-10 NOTE — Patient Instructions (Addendum)
Medication Instructions:  Your physician recommends that you continue on your current medications as directed. Please refer to the Current Medication list given to you today.  Labwork: Your physician recommends that you return for lab work in: TODAY-BMP, CBC  Testing/Procedures: Your physician has requested that you have an echocardiogram. Echocardiography is a painless test that uses sound waves to create images of your heart. It provides your doctor with information about the size and shape of your heart and how well your heart's chambers and valves are working. This procedure takes approximately one hour. There are no restrictions for this procedure.  ECHO IS DONE AT THE [address] OFF[facility] [place]  Follow-Up: Your physician recommends that you schedule a follow-up appointment in: [place]  Any Other Special Instructions Will Be Listed Below (If Applicable).  CONTACT YOUR DR SADIQ'S OFFICE, NEPHROLOGIST, FOR A FOLLOW UP APPOINTMENT   If you need a refill on your cardiac medications before your next appointment, please call your pharmacy.

## 2016-04-10 NOTE — Assessment & Plan Note (Signed)
Intermittent dyspnea, not clearly CHF

## 2016-04-11 DIAGNOSIS — E119 Type 2 diabetes mellitus without complications: Secondary | ICD-10-CM | POA: Diagnosis not present

## 2016-04-11 DIAGNOSIS — M7989 Other specified soft tissue disorders: Secondary | ICD-10-CM | POA: Diagnosis not present

## 2016-04-11 DIAGNOSIS — I10 Essential (primary) hypertension: Secondary | ICD-10-CM | POA: Diagnosis not present

## 2016-04-11 DIAGNOSIS — L89312 Pressure ulcer of right buttock, stage 2: Secondary | ICD-10-CM | POA: Diagnosis not present

## 2016-04-11 DIAGNOSIS — Z7982 Long term (current) use of aspirin: Secondary | ICD-10-CM | POA: Diagnosis not present

## 2016-04-12 DIAGNOSIS — Z7982 Long term (current) use of aspirin: Secondary | ICD-10-CM | POA: Diagnosis not present

## 2016-04-12 DIAGNOSIS — M7989 Other specified soft tissue disorders: Secondary | ICD-10-CM | POA: Diagnosis not present

## 2016-04-12 DIAGNOSIS — I10 Essential (primary) hypertension: Secondary | ICD-10-CM | POA: Diagnosis not present

## 2016-04-12 DIAGNOSIS — L89312 Pressure ulcer of right buttock, stage 2: Secondary | ICD-10-CM | POA: Diagnosis not present

## 2016-04-12 DIAGNOSIS — E119 Type 2 diabetes mellitus without complications: Secondary | ICD-10-CM | POA: Diagnosis not present

## 2016-04-13 ENCOUNTER — Telehealth: Payer: Self-pay | Admitting: *Deleted

## 2016-04-13 NOTE — Telephone Encounter (Signed)
Results and recommendations discussed with patient's son [facility]oy [facility]), who verbalized understanding and thanks. He'll follow up w nephrology, and voiced awareness to call if further questions.

## 2016-04-13 NOTE — Telephone Encounter (Signed)
-----   Message from Erlene Quan, [place] sent at 04/12/2016  8:46 AM EDT ----- Please tell the pt I reviewed her labs from Jan and her labs done this week. Her SCr is gradually rising.  I suggest she decrease her Cozaar to 50 mg daily and decrease her Lasix to 20 mg daily. She should f/u with her nephrologist as suggested for further adjustments.  Kerin Ransom PA-C 04/12/2016 8:46 AM

## 2016-04-16 DIAGNOSIS — E119 Type 2 diabetes mellitus without complications: Secondary | ICD-10-CM | POA: Diagnosis not present

## 2016-04-16 DIAGNOSIS — I10 Essential (primary) hypertension: Secondary | ICD-10-CM | POA: Diagnosis not present

## 2016-04-16 DIAGNOSIS — Z7982 Long term (current) use of aspirin: Secondary | ICD-10-CM | POA: Diagnosis not present

## 2016-04-16 DIAGNOSIS — L89312 Pressure ulcer of right buttock, stage 2: Secondary | ICD-10-CM | POA: Diagnosis not present

## 2016-04-16 DIAGNOSIS — M7989 Other specified soft tissue disorders: Secondary | ICD-10-CM | POA: Diagnosis not present

## 2016-04-17 DIAGNOSIS — L89312 Pressure ulcer of right buttock, stage 2: Secondary | ICD-10-CM | POA: Diagnosis not present

## 2016-04-17 DIAGNOSIS — I10 Essential (primary) hypertension: Secondary | ICD-10-CM | POA: Diagnosis not present

## 2016-04-17 DIAGNOSIS — M7989 Other specified soft tissue disorders: Secondary | ICD-10-CM | POA: Diagnosis not present

## 2016-04-17 DIAGNOSIS — E119 Type 2 diabetes mellitus without complications: Secondary | ICD-10-CM | POA: Diagnosis not present

## 2016-04-17 DIAGNOSIS — Z7982 Long term (current) use of aspirin: Secondary | ICD-10-CM | POA: Diagnosis not present

## 2016-04-18 DIAGNOSIS — M7989 Other specified soft tissue disorders: Secondary | ICD-10-CM | POA: Diagnosis not present

## 2016-04-18 DIAGNOSIS — I10 Essential (primary) hypertension: Secondary | ICD-10-CM | POA: Diagnosis not present

## 2016-04-18 DIAGNOSIS — L89312 Pressure ulcer of right buttock, stage 2: Secondary | ICD-10-CM | POA: Diagnosis not present

## 2016-04-18 DIAGNOSIS — Z7982 Long term (current) use of aspirin: Secondary | ICD-10-CM | POA: Diagnosis not present

## 2016-04-18 DIAGNOSIS — E119 Type 2 diabetes mellitus without complications: Secondary | ICD-10-CM | POA: Diagnosis not present

## 2016-04-21 DIAGNOSIS — Z7982 Long term (current) use of aspirin: Secondary | ICD-10-CM | POA: Diagnosis not present

## 2016-04-21 DIAGNOSIS — I10 Essential (primary) hypertension: Secondary | ICD-10-CM | POA: Diagnosis not present

## 2016-04-21 DIAGNOSIS — M7989 Other specified soft tissue disorders: Secondary | ICD-10-CM | POA: Diagnosis not present

## 2016-04-21 DIAGNOSIS — L89312 Pressure ulcer of right buttock, stage 2: Secondary | ICD-10-CM | POA: Diagnosis not present

## 2016-04-21 DIAGNOSIS — E119 Type 2 diabetes mellitus without complications: Secondary | ICD-10-CM | POA: Diagnosis not present

## 2016-04-23 DIAGNOSIS — E119 Type 2 diabetes mellitus without complications: Secondary | ICD-10-CM | POA: Diagnosis not present

## 2016-04-23 DIAGNOSIS — I10 Essential (primary) hypertension: Secondary | ICD-10-CM | POA: Diagnosis not present

## 2016-04-23 DIAGNOSIS — M7989 Other specified soft tissue disorders: Secondary | ICD-10-CM | POA: Diagnosis not present

## 2016-04-23 DIAGNOSIS — L89312 Pressure ulcer of right buttock, stage 2: Secondary | ICD-10-CM | POA: Diagnosis not present

## 2016-04-23 DIAGNOSIS — Z7982 Long term (current) use of aspirin: Secondary | ICD-10-CM | POA: Diagnosis not present

## 2016-04-24 DIAGNOSIS — H401133 Primary open-angle glaucoma, bilateral, severe stage: Secondary | ICD-10-CM | POA: Diagnosis not present

## 2016-04-24 DIAGNOSIS — E119 Type 2 diabetes mellitus without complications: Secondary | ICD-10-CM | POA: Diagnosis not present

## 2016-04-24 DIAGNOSIS — H353132 Nonexudative age-related macular degeneration, bilateral, intermediate dry stage: Secondary | ICD-10-CM | POA: Diagnosis not present

## 2016-04-24 DIAGNOSIS — H04123 Dry eye syndrome of bilateral lacrimal glands: Secondary | ICD-10-CM | POA: Diagnosis not present

## 2016-04-24 DIAGNOSIS — H348322 Tributary (branch) retinal vein occlusion, left eye, stable: Secondary | ICD-10-CM | POA: Diagnosis not present

## 2016-04-25 DIAGNOSIS — I10 Essential (primary) hypertension: Secondary | ICD-10-CM | POA: Diagnosis not present

## 2016-04-25 DIAGNOSIS — L89312 Pressure ulcer of right buttock, stage 2: Secondary | ICD-10-CM | POA: Diagnosis not present

## 2016-04-25 DIAGNOSIS — Z7982 Long term (current) use of aspirin: Secondary | ICD-10-CM | POA: Diagnosis not present

## 2016-04-25 DIAGNOSIS — E119 Type 2 diabetes mellitus without complications: Secondary | ICD-10-CM | POA: Diagnosis not present

## 2016-04-25 DIAGNOSIS — M7989 Other specified soft tissue disorders: Secondary | ICD-10-CM | POA: Diagnosis not present

## 2016-04-27 DIAGNOSIS — I509 Heart failure, unspecified: Secondary | ICD-10-CM | POA: Diagnosis not present

## 2016-04-27 DIAGNOSIS — L89312 Pressure ulcer of right buttock, stage 2: Secondary | ICD-10-CM | POA: Diagnosis not present

## 2016-04-27 DIAGNOSIS — M7989 Other specified soft tissue disorders: Secondary | ICD-10-CM | POA: Diagnosis not present

## 2016-04-27 DIAGNOSIS — I10 Essential (primary) hypertension: Secondary | ICD-10-CM | POA: Diagnosis not present

## 2016-04-27 DIAGNOSIS — E119 Type 2 diabetes mellitus without complications: Secondary | ICD-10-CM | POA: Diagnosis not present

## 2016-04-27 DIAGNOSIS — Z7982 Long term (current) use of aspirin: Secondary | ICD-10-CM | POA: Diagnosis not present

## 2016-04-30 DIAGNOSIS — Z7982 Long term (current) use of aspirin: Secondary | ICD-10-CM | POA: Diagnosis not present

## 2016-04-30 DIAGNOSIS — L89312 Pressure ulcer of right buttock, stage 2: Secondary | ICD-10-CM | POA: Diagnosis not present

## 2016-04-30 DIAGNOSIS — I10 Essential (primary) hypertension: Secondary | ICD-10-CM | POA: Diagnosis not present

## 2016-04-30 DIAGNOSIS — Z6829 Body mass index (BMI) 29.0-29.9, adult: Secondary | ICD-10-CM | POA: Diagnosis not present

## 2016-04-30 DIAGNOSIS — R05 Cough: Secondary | ICD-10-CM | POA: Diagnosis not present

## 2016-04-30 DIAGNOSIS — M7989 Other specified soft tissue disorders: Secondary | ICD-10-CM | POA: Diagnosis not present

## 2016-04-30 DIAGNOSIS — E119 Type 2 diabetes mellitus without complications: Secondary | ICD-10-CM | POA: Diagnosis not present

## 2016-05-02 DIAGNOSIS — I10 Essential (primary) hypertension: Secondary | ICD-10-CM | POA: Diagnosis not present

## 2016-05-02 DIAGNOSIS — L89312 Pressure ulcer of right buttock, stage 2: Secondary | ICD-10-CM | POA: Diagnosis not present

## 2016-05-02 DIAGNOSIS — Z7982 Long term (current) use of aspirin: Secondary | ICD-10-CM | POA: Diagnosis not present

## 2016-05-02 DIAGNOSIS — E119 Type 2 diabetes mellitus without complications: Secondary | ICD-10-CM | POA: Diagnosis not present

## 2016-05-02 DIAGNOSIS — M7989 Other specified soft tissue disorders: Secondary | ICD-10-CM | POA: Diagnosis not present

## 2016-05-03 ENCOUNTER — Other Ambulatory Visit: Payer: Self-pay

## 2016-05-03 DIAGNOSIS — Z9181 History of falling: Secondary | ICD-10-CM | POA: Diagnosis not present

## 2016-05-03 DIAGNOSIS — R05 Cough: Secondary | ICD-10-CM | POA: Diagnosis not present

## 2016-05-03 DIAGNOSIS — Z789 Other specified health status: Secondary | ICD-10-CM | POA: Diagnosis not present

## 2016-05-03 DIAGNOSIS — K219 Gastro-esophageal reflux disease without esophagitis: Secondary | ICD-10-CM | POA: Diagnosis not present

## 2016-05-03 DIAGNOSIS — I10 Essential (primary) hypertension: Secondary | ICD-10-CM | POA: Diagnosis not present

## 2016-05-03 DIAGNOSIS — R5381 Other malaise: Secondary | ICD-10-CM | POA: Diagnosis present

## 2016-05-03 DIAGNOSIS — N183 Chronic kidney disease, stage 3 (moderate): Secondary | ICD-10-CM | POA: Diagnosis not present

## 2016-05-03 DIAGNOSIS — I509 Heart failure, unspecified: Secondary | ICD-10-CM | POA: Diagnosis not present

## 2016-05-03 DIAGNOSIS — E119 Type 2 diabetes mellitus without complications: Secondary | ICD-10-CM | POA: Diagnosis not present

## 2016-05-03 DIAGNOSIS — Z79899 Other long term (current) drug therapy: Secondary | ICD-10-CM | POA: Diagnosis not present

## 2016-05-03 DIAGNOSIS — E1169 Type 2 diabetes mellitus with other specified complication: Secondary | ICD-10-CM | POA: Diagnosis not present

## 2016-05-03 DIAGNOSIS — E785 Hyperlipidemia, unspecified: Secondary | ICD-10-CM | POA: Diagnosis not present

## 2016-05-03 DIAGNOSIS — H409 Unspecified glaucoma: Secondary | ICD-10-CM | POA: Diagnosis not present

## 2016-05-03 DIAGNOSIS — D62 Acute posthemorrhagic anemia: Secondary | ICD-10-CM | POA: Diagnosis not present

## 2016-05-03 DIAGNOSIS — J440 Chronic obstructive pulmonary disease with acute lower respiratory infection: Secondary | ICD-10-CM | POA: Diagnosis present

## 2016-05-03 DIAGNOSIS — E1122 Type 2 diabetes mellitus with diabetic chronic kidney disease: Secondary | ICD-10-CM | POA: Diagnosis present

## 2016-05-03 DIAGNOSIS — I700 Atherosclerosis of aorta: Secondary | ICD-10-CM | POA: Diagnosis not present

## 2016-05-03 DIAGNOSIS — E1322 Other specified diabetes mellitus with diabetic chronic kidney disease: Secondary | ICD-10-CM | POA: Diagnosis not present

## 2016-05-03 DIAGNOSIS — N179 Acute kidney failure, unspecified: Secondary | ICD-10-CM | POA: Diagnosis not present

## 2016-05-03 DIAGNOSIS — E860 Dehydration: Secondary | ICD-10-CM | POA: Diagnosis not present

## 2016-05-03 DIAGNOSIS — I5032 Chronic diastolic (congestive) heart failure: Secondary | ICD-10-CM | POA: Diagnosis not present

## 2016-05-03 DIAGNOSIS — J181 Lobar pneumonia, unspecified organism: Secondary | ICD-10-CM | POA: Diagnosis not present

## 2016-05-03 DIAGNOSIS — I130 Hypertensive heart and chronic kidney disease with heart failure and stage 1 through stage 4 chronic kidney disease, or unspecified chronic kidney disease: Secondary | ICD-10-CM | POA: Diagnosis not present

## 2016-05-03 DIAGNOSIS — K922 Gastrointestinal hemorrhage, unspecified: Secondary | ICD-10-CM | POA: Diagnosis not present

## 2016-05-03 DIAGNOSIS — Z7982 Long term (current) use of aspirin: Secondary | ICD-10-CM | POA: Diagnosis not present

## 2016-05-03 DIAGNOSIS — J15212 Pneumonia due to Methicillin resistant Staphylococcus aureus: Secondary | ICD-10-CM | POA: Diagnosis not present

## 2016-05-03 MED ORDER — ISOSORBIDE MONONITRATE ER 120 MG PO TB24: 240.0000 mg | ORAL_TABLET | Freq: Every day | ORAL | 3 refills | Status: AC

## 2016-05-08 ENCOUNTER — Other Ambulatory Visit (HOSPITAL_COMMUNITY): Payer: Medicare Other

## 2016-05-08 DIAGNOSIS — E1169 Type 2 diabetes mellitus with other specified complication: Secondary | ICD-10-CM | POA: Diagnosis not present

## 2016-05-08 DIAGNOSIS — Z789 Other specified health status: Secondary | ICD-10-CM | POA: Diagnosis not present

## 2016-05-08 DIAGNOSIS — I10 Essential (primary) hypertension: Secondary | ICD-10-CM | POA: Diagnosis not present

## 2016-05-08 DIAGNOSIS — J15212 Pneumonia due to Methicillin resistant Staphylococcus aureus: Secondary | ICD-10-CM | POA: Diagnosis not present

## 2016-05-08 DIAGNOSIS — K219 Gastro-esophageal reflux disease without esophagitis: Secondary | ICD-10-CM | POA: Diagnosis not present

## 2016-05-08 DIAGNOSIS — I5032 Chronic diastolic (congestive) heart failure: Secondary | ICD-10-CM | POA: Diagnosis not present

## 2016-05-08 DIAGNOSIS — E785 Hyperlipidemia, unspecified: Secondary | ICD-10-CM | POA: Diagnosis not present

## 2016-05-08 DIAGNOSIS — E119 Type 2 diabetes mellitus without complications: Secondary | ICD-10-CM | POA: Diagnosis not present

## 2016-05-08 DIAGNOSIS — D62 Acute posthemorrhagic anemia: Secondary | ICD-10-CM | POA: Diagnosis not present

## 2016-05-08 DIAGNOSIS — I509 Heart failure, unspecified: Secondary | ICD-10-CM | POA: Diagnosis not present

## 2016-05-08 DIAGNOSIS — J9621 Acute and chronic respiratory failure with hypoxia: Secondary | ICD-10-CM | POA: Diagnosis not present

## 2016-05-08 DIAGNOSIS — J181 Lobar pneumonia, unspecified organism: Secondary | ICD-10-CM | POA: Diagnosis not present

## 2016-05-08 DIAGNOSIS — N183 Chronic kidney disease, stage 3 (moderate): Secondary | ICD-10-CM | POA: Diagnosis not present

## 2016-05-08 DIAGNOSIS — E1322 Other specified diabetes mellitus with diabetic chronic kidney disease: Secondary | ICD-10-CM | POA: Diagnosis not present

## 2016-05-08 DIAGNOSIS — H409 Unspecified glaucoma: Secondary | ICD-10-CM | POA: Diagnosis not present

## 2016-05-10 DIAGNOSIS — N183 Chronic kidney disease, stage 3 (moderate): Secondary | ICD-10-CM | POA: Diagnosis not present

## 2016-05-10 DIAGNOSIS — J9621 Acute and chronic respiratory failure with hypoxia: Secondary | ICD-10-CM | POA: Diagnosis not present

## 2016-05-10 DIAGNOSIS — J15212 Pneumonia due to Methicillin resistant Staphylococcus aureus: Secondary | ICD-10-CM | POA: Diagnosis not present

## 2016-05-10 DIAGNOSIS — I5032 Chronic diastolic (congestive) heart failure: Secondary | ICD-10-CM | POA: Diagnosis not present

## 2016-05-23 DIAGNOSIS — M7989 Other specified soft tissue disorders: Secondary | ICD-10-CM | POA: Diagnosis not present

## 2016-05-23 DIAGNOSIS — I10 Essential (primary) hypertension: Secondary | ICD-10-CM | POA: Diagnosis not present

## 2016-05-23 DIAGNOSIS — L89312 Pressure ulcer of right buttock, stage 2: Secondary | ICD-10-CM | POA: Diagnosis not present

## 2016-05-23 DIAGNOSIS — Z7982 Long term (current) use of aspirin: Secondary | ICD-10-CM | POA: Diagnosis not present

## 2016-05-23 DIAGNOSIS — E119 Type 2 diabetes mellitus without complications: Secondary | ICD-10-CM | POA: Diagnosis not present

## 2016-05-25 DIAGNOSIS — Z7982 Long term (current) use of aspirin: Secondary | ICD-10-CM | POA: Diagnosis not present

## 2016-05-25 DIAGNOSIS — I5032 Chronic diastolic (congestive) heart failure: Secondary | ICD-10-CM | POA: Diagnosis not present

## 2016-05-25 DIAGNOSIS — I10 Essential (primary) hypertension: Secondary | ICD-10-CM | POA: Diagnosis not present

## 2016-05-25 DIAGNOSIS — E119 Type 2 diabetes mellitus without complications: Secondary | ICD-10-CM | POA: Diagnosis not present

## 2016-05-25 DIAGNOSIS — L89312 Pressure ulcer of right buttock, stage 2: Secondary | ICD-10-CM | POA: Diagnosis not present

## 2016-05-25 DIAGNOSIS — I130 Hypertensive heart and chronic kidney disease with heart failure and stage 1 through stage 4 chronic kidney disease, or unspecified chronic kidney disease: Secondary | ICD-10-CM | POA: Diagnosis not present

## 2016-05-25 DIAGNOSIS — Z862 Personal history of diseases of the blood and blood-forming organs and certain disorders involving the immune mechanism: Secondary | ICD-10-CM | POA: Diagnosis not present

## 2016-05-25 DIAGNOSIS — M7989 Other specified soft tissue disorders: Secondary | ICD-10-CM | POA: Diagnosis not present

## 2016-05-28 DIAGNOSIS — L89312 Pressure ulcer of right buttock, stage 2: Secondary | ICD-10-CM | POA: Diagnosis not present

## 2016-05-28 DIAGNOSIS — Z7982 Long term (current) use of aspirin: Secondary | ICD-10-CM | POA: Diagnosis not present

## 2016-05-28 DIAGNOSIS — I10 Essential (primary) hypertension: Secondary | ICD-10-CM | POA: Diagnosis not present

## 2016-05-28 DIAGNOSIS — M7989 Other specified soft tissue disorders: Secondary | ICD-10-CM | POA: Diagnosis not present

## 2016-05-28 DIAGNOSIS — Z862 Personal history of diseases of the blood and blood-forming organs and certain disorders involving the immune mechanism: Secondary | ICD-10-CM | POA: Diagnosis not present

## 2016-05-28 DIAGNOSIS — E119 Type 2 diabetes mellitus without complications: Secondary | ICD-10-CM | POA: Diagnosis not present

## 2016-05-29 DIAGNOSIS — K5731 Diverticulosis of large intestine without perforation or abscess with bleeding: Secondary | ICD-10-CM | POA: Diagnosis not present

## 2016-05-29 DIAGNOSIS — D62 Acute posthemorrhagic anemia: Secondary | ICD-10-CM | POA: Diagnosis not present

## 2016-05-30 DIAGNOSIS — Z7982 Long term (current) use of aspirin: Secondary | ICD-10-CM | POA: Diagnosis not present

## 2016-05-30 DIAGNOSIS — I10 Essential (primary) hypertension: Secondary | ICD-10-CM | POA: Diagnosis not present

## 2016-05-30 DIAGNOSIS — L89312 Pressure ulcer of right buttock, stage 2: Secondary | ICD-10-CM | POA: Diagnosis not present

## 2016-05-30 DIAGNOSIS — E119 Type 2 diabetes mellitus without complications: Secondary | ICD-10-CM | POA: Diagnosis not present

## 2016-05-30 DIAGNOSIS — M7989 Other specified soft tissue disorders: Secondary | ICD-10-CM | POA: Diagnosis not present

## 2016-05-31 DIAGNOSIS — I130 Hypertensive heart and chronic kidney disease with heart failure and stage 1 through stage 4 chronic kidney disease, or unspecified chronic kidney disease: Secondary | ICD-10-CM | POA: Diagnosis not present

## 2016-06-01 DIAGNOSIS — I10 Essential (primary) hypertension: Secondary | ICD-10-CM | POA: Diagnosis not present

## 2016-06-01 DIAGNOSIS — L89312 Pressure ulcer of right buttock, stage 2: Secondary | ICD-10-CM | POA: Diagnosis not present

## 2016-06-01 DIAGNOSIS — E119 Type 2 diabetes mellitus without complications: Secondary | ICD-10-CM | POA: Diagnosis not present

## 2016-06-01 DIAGNOSIS — M7989 Other specified soft tissue disorders: Secondary | ICD-10-CM | POA: Diagnosis not present

## 2016-06-01 DIAGNOSIS — Z7982 Long term (current) use of aspirin: Secondary | ICD-10-CM | POA: Diagnosis not present

## 2016-06-04 DIAGNOSIS — Z7982 Long term (current) use of aspirin: Secondary | ICD-10-CM | POA: Diagnosis not present

## 2016-06-04 DIAGNOSIS — E119 Type 2 diabetes mellitus without complications: Secondary | ICD-10-CM | POA: Diagnosis not present

## 2016-06-04 DIAGNOSIS — L89312 Pressure ulcer of right buttock, stage 2: Secondary | ICD-10-CM | POA: Diagnosis not present

## 2016-06-04 DIAGNOSIS — I10 Essential (primary) hypertension: Secondary | ICD-10-CM | POA: Diagnosis not present

## 2016-06-04 DIAGNOSIS — M7989 Other specified soft tissue disorders: Secondary | ICD-10-CM | POA: Diagnosis not present

## 2016-06-05 DIAGNOSIS — R8290 Unspecified abnormal findings in urine: Secondary | ICD-10-CM | POA: Diagnosis not present

## 2016-06-05 DIAGNOSIS — Z6828 Body mass index (BMI) 28.0-28.9, adult: Secondary | ICD-10-CM | POA: Diagnosis not present

## 2016-06-05 DIAGNOSIS — M7989 Other specified soft tissue disorders: Secondary | ICD-10-CM | POA: Diagnosis not present

## 2016-06-05 DIAGNOSIS — E119 Type 2 diabetes mellitus without complications: Secondary | ICD-10-CM | POA: Diagnosis not present

## 2016-06-05 DIAGNOSIS — Z7982 Long term (current) use of aspirin: Secondary | ICD-10-CM | POA: Diagnosis not present

## 2016-06-05 DIAGNOSIS — L89312 Pressure ulcer of right buttock, stage 2: Secondary | ICD-10-CM | POA: Diagnosis not present

## 2016-06-05 DIAGNOSIS — K589 Irritable bowel syndrome without diarrhea: Secondary | ICD-10-CM | POA: Diagnosis not present

## 2016-06-05 DIAGNOSIS — I10 Essential (primary) hypertension: Secondary | ICD-10-CM | POA: Diagnosis not present

## 2016-06-06 DIAGNOSIS — Z8701 Personal history of pneumonia (recurrent): Secondary | ICD-10-CM | POA: Diagnosis not present

## 2016-06-06 DIAGNOSIS — N183 Chronic kidney disease, stage 3 (moderate): Secondary | ICD-10-CM | POA: Diagnosis not present

## 2016-06-06 DIAGNOSIS — E1122 Type 2 diabetes mellitus with diabetic chronic kidney disease: Secondary | ICD-10-CM | POA: Diagnosis not present

## 2016-06-06 DIAGNOSIS — H409 Unspecified glaucoma: Secondary | ICD-10-CM | POA: Diagnosis not present

## 2016-06-06 DIAGNOSIS — I5032 Chronic diastolic (congestive) heart failure: Secondary | ICD-10-CM | POA: Diagnosis not present

## 2016-06-06 DIAGNOSIS — M6281 Muscle weakness (generalized): Secondary | ICD-10-CM | POA: Diagnosis not present

## 2016-06-06 DIAGNOSIS — K219 Gastro-esophageal reflux disease without esophagitis: Secondary | ICD-10-CM | POA: Diagnosis not present

## 2016-06-06 DIAGNOSIS — Z9181 History of falling: Secondary | ICD-10-CM | POA: Diagnosis not present

## 2016-06-06 DIAGNOSIS — E785 Hyperlipidemia, unspecified: Secondary | ICD-10-CM | POA: Diagnosis not present

## 2016-06-06 DIAGNOSIS — D62 Acute posthemorrhagic anemia: Secondary | ICD-10-CM | POA: Diagnosis not present

## 2016-06-06 DIAGNOSIS — M7989 Other specified soft tissue disorders: Secondary | ICD-10-CM | POA: Diagnosis not present

## 2016-06-06 DIAGNOSIS — I130 Hypertensive heart and chronic kidney disease with heart failure and stage 1 through stage 4 chronic kidney disease, or unspecified chronic kidney disease: Secondary | ICD-10-CM | POA: Diagnosis not present

## 2016-06-06 DIAGNOSIS — Z7982 Long term (current) use of aspirin: Secondary | ICD-10-CM | POA: Diagnosis not present

## 2016-06-07 DIAGNOSIS — I130 Hypertensive heart and chronic kidney disease with heart failure and stage 1 through stage 4 chronic kidney disease, or unspecified chronic kidney disease: Secondary | ICD-10-CM | POA: Diagnosis not present

## 2016-06-08 DIAGNOSIS — E1122 Type 2 diabetes mellitus with diabetic chronic kidney disease: Secondary | ICD-10-CM | POA: Diagnosis not present

## 2016-06-08 DIAGNOSIS — I5032 Chronic diastolic (congestive) heart failure: Secondary | ICD-10-CM | POA: Diagnosis not present

## 2016-06-08 DIAGNOSIS — N183 Chronic kidney disease, stage 3 (moderate): Secondary | ICD-10-CM | POA: Diagnosis not present

## 2016-06-08 DIAGNOSIS — M7989 Other specified soft tissue disorders: Secondary | ICD-10-CM | POA: Diagnosis not present

## 2016-06-08 DIAGNOSIS — I130 Hypertensive heart and chronic kidney disease with heart failure and stage 1 through stage 4 chronic kidney disease, or unspecified chronic kidney disease: Secondary | ICD-10-CM | POA: Diagnosis not present

## 2016-06-08 DIAGNOSIS — D62 Acute posthemorrhagic anemia: Secondary | ICD-10-CM | POA: Diagnosis not present

## 2016-06-12 DIAGNOSIS — N183 Chronic kidney disease, stage 3 (moderate): Secondary | ICD-10-CM | POA: Diagnosis not present

## 2016-06-12 DIAGNOSIS — E1122 Type 2 diabetes mellitus with diabetic chronic kidney disease: Secondary | ICD-10-CM | POA: Diagnosis not present

## 2016-06-13 ENCOUNTER — Encounter: Payer: Self-pay | Admitting: Cardiology

## 2016-06-13 DIAGNOSIS — I509 Heart failure, unspecified: Secondary | ICD-10-CM | POA: Diagnosis not present

## 2016-06-13 DIAGNOSIS — I5032 Chronic diastolic (congestive) heart failure: Secondary | ICD-10-CM | POA: Diagnosis not present

## 2016-06-13 DIAGNOSIS — M7989 Other specified soft tissue disorders: Secondary | ICD-10-CM | POA: Diagnosis not present

## 2016-06-13 DIAGNOSIS — D62 Acute posthemorrhagic anemia: Secondary | ICD-10-CM | POA: Diagnosis not present

## 2016-06-13 DIAGNOSIS — N183 Chronic kidney disease, stage 3 (moderate): Secondary | ICD-10-CM | POA: Diagnosis not present

## 2016-06-13 DIAGNOSIS — Z6829 Body mass index (BMI) 29.0-29.9, adult: Secondary | ICD-10-CM | POA: Diagnosis not present

## 2016-06-13 DIAGNOSIS — E1122 Type 2 diabetes mellitus with diabetic chronic kidney disease: Secondary | ICD-10-CM | POA: Diagnosis not present

## 2016-06-13 DIAGNOSIS — I130 Hypertensive heart and chronic kidney disease with heart failure and stage 1 through stage 4 chronic kidney disease, or unspecified chronic kidney disease: Secondary | ICD-10-CM | POA: Diagnosis not present

## 2016-06-15 DIAGNOSIS — I5032 Chronic diastolic (congestive) heart failure: Secondary | ICD-10-CM | POA: Diagnosis not present

## 2016-06-15 DIAGNOSIS — I130 Hypertensive heart and chronic kidney disease with heart failure and stage 1 through stage 4 chronic kidney disease, or unspecified chronic kidney disease: Secondary | ICD-10-CM | POA: Diagnosis not present

## 2016-06-15 DIAGNOSIS — M7989 Other specified soft tissue disorders: Secondary | ICD-10-CM | POA: Diagnosis not present

## 2016-06-15 DIAGNOSIS — E1122 Type 2 diabetes mellitus with diabetic chronic kidney disease: Secondary | ICD-10-CM | POA: Diagnosis not present

## 2016-06-15 DIAGNOSIS — N183 Chronic kidney disease, stage 3 (moderate): Secondary | ICD-10-CM | POA: Diagnosis not present

## 2016-06-15 DIAGNOSIS — D62 Acute posthemorrhagic anemia: Secondary | ICD-10-CM | POA: Diagnosis not present

## 2016-06-18 DIAGNOSIS — M7989 Other specified soft tissue disorders: Secondary | ICD-10-CM | POA: Diagnosis not present

## 2016-06-18 DIAGNOSIS — I5032 Chronic diastolic (congestive) heart failure: Secondary | ICD-10-CM | POA: Diagnosis not present

## 2016-06-18 DIAGNOSIS — E1122 Type 2 diabetes mellitus with diabetic chronic kidney disease: Secondary | ICD-10-CM | POA: Diagnosis not present

## 2016-06-18 DIAGNOSIS — I130 Hypertensive heart and chronic kidney disease with heart failure and stage 1 through stage 4 chronic kidney disease, or unspecified chronic kidney disease: Secondary | ICD-10-CM | POA: Diagnosis not present

## 2016-06-18 DIAGNOSIS — N183 Chronic kidney disease, stage 3 (moderate): Secondary | ICD-10-CM | POA: Diagnosis not present

## 2016-06-18 DIAGNOSIS — D62 Acute posthemorrhagic anemia: Secondary | ICD-10-CM | POA: Diagnosis not present

## 2016-06-19 DIAGNOSIS — D62 Acute posthemorrhagic anemia: Secondary | ICD-10-CM | POA: Diagnosis not present

## 2016-06-19 DIAGNOSIS — K5731 Diverticulosis of large intestine without perforation or abscess with bleeding: Secondary | ICD-10-CM | POA: Diagnosis not present

## 2016-06-20 DIAGNOSIS — I130 Hypertensive heart and chronic kidney disease with heart failure and stage 1 through stage 4 chronic kidney disease, or unspecified chronic kidney disease: Secondary | ICD-10-CM | POA: Diagnosis not present

## 2016-06-20 DIAGNOSIS — N183 Chronic kidney disease, stage 3 (moderate): Secondary | ICD-10-CM | POA: Diagnosis not present

## 2016-06-20 DIAGNOSIS — D62 Acute posthemorrhagic anemia: Secondary | ICD-10-CM | POA: Diagnosis not present

## 2016-06-20 DIAGNOSIS — I5032 Chronic diastolic (congestive) heart failure: Secondary | ICD-10-CM | POA: Diagnosis not present

## 2016-06-20 DIAGNOSIS — M7989 Other specified soft tissue disorders: Secondary | ICD-10-CM | POA: Diagnosis not present

## 2016-06-20 DIAGNOSIS — E1122 Type 2 diabetes mellitus with diabetic chronic kidney disease: Secondary | ICD-10-CM | POA: Diagnosis not present

## 2016-06-22 DIAGNOSIS — I130 Hypertensive heart and chronic kidney disease with heart failure and stage 1 through stage 4 chronic kidney disease, or unspecified chronic kidney disease: Secondary | ICD-10-CM | POA: Diagnosis not present

## 2016-06-22 DIAGNOSIS — E1122 Type 2 diabetes mellitus with diabetic chronic kidney disease: Secondary | ICD-10-CM | POA: Diagnosis not present

## 2016-06-22 DIAGNOSIS — D62 Acute posthemorrhagic anemia: Secondary | ICD-10-CM | POA: Diagnosis not present

## 2016-06-22 DIAGNOSIS — N183 Chronic kidney disease, stage 3 (moderate): Secondary | ICD-10-CM | POA: Diagnosis not present

## 2016-06-22 DIAGNOSIS — I5032 Chronic diastolic (congestive) heart failure: Secondary | ICD-10-CM | POA: Diagnosis not present

## 2016-06-22 DIAGNOSIS — M7989 Other specified soft tissue disorders: Secondary | ICD-10-CM | POA: Diagnosis not present

## 2016-06-25 ENCOUNTER — Other Ambulatory Visit: Payer: Self-pay

## 2016-06-25 ENCOUNTER — Ambulatory Visit (HOSPITAL_COMMUNITY): Payer: Medicare Other | Attending: Cardiology

## 2016-06-25 DIAGNOSIS — E1122 Type 2 diabetes mellitus with diabetic chronic kidney disease: Secondary | ICD-10-CM | POA: Diagnosis not present

## 2016-06-25 DIAGNOSIS — I130 Hypertensive heart and chronic kidney disease with heart failure and stage 1 through stage 4 chronic kidney disease, or unspecified chronic kidney disease: Secondary | ICD-10-CM | POA: Diagnosis not present

## 2016-06-25 DIAGNOSIS — I081 Rheumatic disorders of both mitral and tricuspid valves: Secondary | ICD-10-CM | POA: Insufficient documentation

## 2016-06-25 DIAGNOSIS — R0600 Dyspnea, unspecified: Secondary | ICD-10-CM

## 2016-06-25 DIAGNOSIS — N189 Chronic kidney disease, unspecified: Secondary | ICD-10-CM | POA: Insufficient documentation

## 2016-06-25 DIAGNOSIS — I509 Heart failure, unspecified: Secondary | ICD-10-CM | POA: Diagnosis not present

## 2016-06-25 DIAGNOSIS — E785 Hyperlipidemia, unspecified: Secondary | ICD-10-CM | POA: Insufficient documentation

## 2016-06-25 DIAGNOSIS — I2510 Atherosclerotic heart disease of native coronary artery without angina pectoris: Secondary | ICD-10-CM | POA: Insufficient documentation

## 2016-06-26 DIAGNOSIS — D62 Acute posthemorrhagic anemia: Secondary | ICD-10-CM | POA: Diagnosis not present

## 2016-06-26 DIAGNOSIS — M7989 Other specified soft tissue disorders: Secondary | ICD-10-CM | POA: Diagnosis not present

## 2016-06-26 DIAGNOSIS — I5032 Chronic diastolic (congestive) heart failure: Secondary | ICD-10-CM | POA: Diagnosis not present

## 2016-06-26 DIAGNOSIS — N183 Chronic kidney disease, stage 3 (moderate): Secondary | ICD-10-CM | POA: Diagnosis not present

## 2016-06-26 DIAGNOSIS — I130 Hypertensive heart and chronic kidney disease with heart failure and stage 1 through stage 4 chronic kidney disease, or unspecified chronic kidney disease: Secondary | ICD-10-CM | POA: Diagnosis not present

## 2016-06-26 DIAGNOSIS — E1122 Type 2 diabetes mellitus with diabetic chronic kidney disease: Secondary | ICD-10-CM | POA: Diagnosis not present

## 2016-07-01 NOTE — Progress Notes (Signed)
Cardiology Office Note   Date:  07/02/2016   ID:  Kelly Ruiz, DOB 1923/10/23, MRN [id number]  PCP:  Kelly Hipps, MD  Cardiologist:   Kelly Breeding, MD    Chief Complaint  Patient presents with  . Chronic Diastolic Dysfunction      History of Present Illness: Kelly Ruiz is a 83 y.o. female who presents for follow up of chronic diastolic dysfunction.   She had a remote abnormal Myoview but her most recent Myoview Jan 2016 was normal.   Echo earlier this month demonstrated NL EF with evidence of moderate LVH.  Since I saw her she was in the hospital [place] with pneumonia. She's also had progressive anemia with perhaps diverticular bleed guaiac positive stools and had her aspirin stopped. She's required transfusions. Finally she's had her diuretic change to Bumex.  She's been urinating more in her weight is down. She's not having any acute shortness of breath, PND or orthopnea. She's not having any new lower extremity edema. She actually feels pretty good. She's not having any chest pressure, neck or arm discomfort.  Past Medical History:  Diagnosis Date  . CHF (congestive heart failure) (HCC)    Diastolic. Precipitated by Actos  . Chronic kidney disease    mild-moderate. GFR was 35 in 02/12  . Coronary artery disease 02/2010   abnormal nuclear stress test with mild ischemia in apical anterolateral wall.   . DM (diabetes mellitus) ([place])   . Dyslipidemia   . Hypertension   . Nasal polyps   . Unspecified hypertensive heart disease without heart failure     Past Surgical History:  Procedure Laterality Date  . APPENDECTOMY  1951  . CATARACT EXTRACTION, BILATERAL    . CHOLECYSTECTOMY    . GLAUCOMA SURGERY  10/29/2013  . hysterectomy - unknown type    . NECK MASS EXCISION     non cancerous  . TONSILLECTOMY       Current Outpatient Prescriptions  Medication Sig Dispense Refill  . acetaminophen (TYLENOL) 500 MG tablet Take 500 mg by mouth 2 (two) times daily.      [name] atorvastatin (LIPITOR) 10 MG tablet Take 10 mg by mouth daily.    . brinzolamide (AZOPT) 1 % ophthalmic suspension 1 drop 2 (two) times daily.    . bumetanide (BUMEX) 1 MG tablet Take 0.5 tablets by mouth 2 (two) times daily.    . Calcium Carbonate-Vitamin D (CALCIUM-VITAMIN D) 500-200 MG-UNIT per tablet Take 1 tablet by mouth 2 (two) times daily with a meal.    . carvedilol (COREG) 12.5 MG tablet Take 1 tablet (12.5 mg total) by mouth 2 (two) times daily with a meal. 60 tablet 6  . cyanocobalamin 1000 MCG tablet Take 2,000 mcg by mouth daily.     [name] dicyclomine (BENTYL) 10 MG capsule Take 10 mg by mouth as needed.     . ferrous sulfate 325 (65 FE) MG tablet Take 325 mg by mouth daily with breakfast.    . fluticasone (FLONASE) 50 MCG/ACT nasal spray Place 2 sprays into both nostrils 2 (two) times daily.    . hydrALAZINE (APRESOLINE) 50 MG tablet Take 100 mg by mouth 2 (two) times daily.     . isosorbide mononitrate (IMDUR) 120 MG 24 hr tablet Take 2 tablets (240 mg total) by mouth daily. 180 tablet 3  . latanoprost (XALATAN) 0.005 % ophthalmic solution     . losartan (COZAAR) 50 MG tablet Take 50 mg by mouth daily.     [name]  montelukast (SINGULAIR) 10 MG tablet Take 10 mg by mouth at bedtime.    . Multiple Vitamin (MULTIVITAMIN) tablet Take 1 tablet by mouth daily.    . Multiple Vitamins-Minerals (EYE VITAMINS PO) Take by mouth 2 (two) times daily.    . nitroGLYCERIN (NITROSTAT) 0.4 MG SL tablet Place 1 tablet (0.4 mg total) under the tongue every 5 (five) minutes as needed for chest pain. 25 tablet 3  . Omega-3 Fatty Acids (FISH OIL) 1200 MG CAPS Take 1 capsule by mouth daily.    [name] omeprazole (PRILOSEC) 20 MG capsule Take 20 mg by mouth daily.    . timolol (TIMOPTIC) 0.5 % ophthalmic solution Place 1 drop into the right eye 2 (two) times daily.     . vitamin C (ASCORBIC ACID) 500 MG tablet Take 500 mg by mouth daily.     No current facility-administered medications for this visit.      Allergies:   Actos [pioglitazone hydrochloride]; Felodipine er; Hctz [hydrochlorothiazide]; Lisinopril; Penicillins; Plendil [felodipine]; Clonidine derivatives; Combigan [brimonidine tartrate-timolol]; Dorzolamide; Monopril [fosinopril sodium]; and Norvasc [amlodipine besylate]    ROS:  Please see the history of present illness.   Otherwise, review of systems are positive for none.   All other systems are reviewed and negative.    PHYSICAL EXAM: VS:  BP (!) 150/58 (BP Location: Left Arm, Patient Position: Sitting, Cuff Size: Normal)   Pulse (!) 57   Ht 5' (1.524 m)   Wt 142 lb 3.2 oz (64.5 kg)   BMI 27.77 kg/m  , BMI Body mass index is 27.77 kg/m.  GENERAL:  Well appearing for her age. NECK:  No jugular venous distention, waveform within normal limits, carotid upstroke brisk and symmetric, right carotid bruits, no thyromegaly LUNGS:  Clear to auscultation bilaterally CHEST:  Unremarkable HEART:  PMI not displaced or sustained,S1 and S2 within normal limits, no S3, no S4, no clicks, no rubs, no murmurs ABD:  Flat, positive bowel sounds normal in frequency in pitch, no bruits, no rebound, no guarding, no midline pulsatile mass, no hepatomegaly, no splenomegaly EXT:  2 plus pulses throughout, no edema, no cyanosis no clubbing   EKG:  EKG is not ordered today.    Recent Labs: 04/10/2016: Hemoglobin 9.6; Platelets 171 07/02/2016: BUN 51; Creatinine, Ser 1.70; Potassium 4.3; Sodium 138    Lipid Panel No results found for: CHOL, TRIG, HDL, CHOLHDL, VLDL, LDLCALC, LDLDIRECT    Wt Readings from Last 3 Encounters:  07/02/16 142 lb 3.2 oz (64.5 kg)  04/10/16 149 lb 3.2 oz (67.7 kg)  01/10/16 148 lb (67.1 kg)      Other studies Reviewed: Additional studies/ records that were reviewed today include: None. Review of the above records demonstrates:     ASSESSMENT AND PLAN:  CHF (congestive heart failure) - She seems to be euvolemic.  I explained that we need to keep a close  watch on her BMET given her CKD and the change in her diuretic. I will check a BMET today.   Coronary artery disease -  She wants conservative therapy.  No change in meds is planned.   Hypertension -  Her BP is mildly elevated today.  She is having this followed by nurses at home and I have instructed them to give me readings so that I might change her meds as needed.  Bruit - She had mild disease.  No further imaging is planned.   CKD - As above.    Current medicines are reviewed at length with  the patient today.  The patient does not have concerns regarding medicines.  The following changes have been made:  no change  Labs/ tests ordered today include:   Orders Placed This Encounter  Procedures  . Basic Metabolic Panel (BMET)     Disposition:   FU with me in six months or sooner if needed.      Signed, Kelly Breeding, MD  07/02/2016 5:23 PM    [place]

## 2016-07-02 ENCOUNTER — Encounter: Payer: Self-pay | Admitting: Cardiology

## 2016-07-02 ENCOUNTER — Ambulatory Visit (INDEPENDENT_AMBULATORY_CARE_PROVIDER_SITE_OTHER): Payer: Medicare Other | Admitting: Cardiology

## 2016-07-02 VITALS — BP 150/58 | HR 57 | Ht 60.0 in | Wt 142.2 lb

## 2016-07-02 DIAGNOSIS — N183 Chronic kidney disease, stage 3 unspecified: Secondary | ICD-10-CM

## 2016-07-02 DIAGNOSIS — Z79899 Other long term (current) drug therapy: Secondary | ICD-10-CM

## 2016-07-02 DIAGNOSIS — I6521 Occlusion and stenosis of right carotid artery: Secondary | ICD-10-CM

## 2016-07-02 DIAGNOSIS — I2510 Atherosclerotic heart disease of native coronary artery without angina pectoris: Secondary | ICD-10-CM | POA: Diagnosis not present

## 2016-07-02 DIAGNOSIS — I5032 Chronic diastolic (congestive) heart failure: Secondary | ICD-10-CM | POA: Diagnosis not present

## 2016-07-02 LAB — BASIC METABOLIC PANEL
BUN / CREAT RATIO: 30 — AB (ref 12–28)
BUN: 51 mg/dL — ABNORMAL HIGH (ref 10–36)
CHLORIDE: 101 mmol/L (ref 96–106)
CO2: 22 mmol/L (ref 20–29)
Calcium: 9.4 mg/dL (ref 8.7–10.3)
Creatinine, Ser: 1.7 mg/dL — ABNORMAL HIGH (ref 0.57–1.00)
GFR calc non Af Amer: 26 mL/min/{1.73_m2} — ABNORMAL LOW (ref >59)
GFR, EST AFRICAN AMERICAN: 30 mL/min/{1.73_m2} — AB (ref >59)
Glucose: 157 mg/dL — ABNORMAL HIGH (ref 65–99)
POTASSIUM: 4.3 mmol/L (ref 3.5–5.2)
SODIUM: 138 mmol/L (ref 134–144)

## 2016-07-02 NOTE — Patient Instructions (Signed)
Medication Instructions:  Continue current medications  Labwork: BMP  Testing/Procedures: None ordered  Follow-Up: Your physician wants you to follow-up in: 6 Months. You will receive a reminder letter in the mail two months in advance. If you don't receive a letter, please call our office to schedule the follow-up appointment.   Any Other Special Instructions Will Be Listed Below (If Applicable).   If you need a refill on your cardiac medications before your next appointment, please call your pharmacy.

## 2016-07-23 DIAGNOSIS — D62 Acute posthemorrhagic anemia: Secondary | ICD-10-CM | POA: Diagnosis not present

## 2016-07-23 DIAGNOSIS — E119 Type 2 diabetes mellitus without complications: Secondary | ICD-10-CM | POA: Diagnosis not present

## 2016-07-26 DIAGNOSIS — R197 Diarrhea, unspecified: Secondary | ICD-10-CM | POA: Diagnosis not present

## 2016-07-26 DIAGNOSIS — D649 Anemia, unspecified: Secondary | ICD-10-CM | POA: Diagnosis not present

## 2016-07-29 DIAGNOSIS — R197 Diarrhea, unspecified: Secondary | ICD-10-CM | POA: Diagnosis not present

## 2016-07-30 ENCOUNTER — Ambulatory Visit: Payer: Medicare Other | Admitting: Allergy and Immunology

## 2016-07-30 DIAGNOSIS — N183 Chronic kidney disease, stage 3 (moderate): Secondary | ICD-10-CM | POA: Diagnosis not present

## 2016-08-07 DIAGNOSIS — I129 Hypertensive chronic kidney disease with stage 1 through stage 4 chronic kidney disease, or unspecified chronic kidney disease: Secondary | ICD-10-CM | POA: Diagnosis not present

## 2016-08-07 DIAGNOSIS — I5032 Chronic diastolic (congestive) heart failure: Secondary | ICD-10-CM | POA: Diagnosis not present

## 2016-08-07 DIAGNOSIS — N183 Chronic kidney disease, stage 3 (moderate): Secondary | ICD-10-CM | POA: Diagnosis not present

## 2016-08-14 DIAGNOSIS — J323 Chronic sphenoidal sinusitis: Secondary | ICD-10-CM | POA: Diagnosis not present

## 2016-08-14 DIAGNOSIS — J322 Chronic ethmoidal sinusitis: Secondary | ICD-10-CM | POA: Diagnosis not present

## 2016-08-14 DIAGNOSIS — J339 Nasal polyp, unspecified: Secondary | ICD-10-CM | POA: Diagnosis not present

## 2016-08-14 DIAGNOSIS — J3489 Other specified disorders of nose and nasal sinuses: Secondary | ICD-10-CM | POA: Diagnosis not present

## 2016-08-23 DIAGNOSIS — E119 Type 2 diabetes mellitus without complications: Secondary | ICD-10-CM | POA: Diagnosis not present

## 2016-08-23 DIAGNOSIS — Z0000 Encounter for general adult medical examination without abnormal findings: Secondary | ICD-10-CM | POA: Diagnosis not present

## 2016-08-28 DIAGNOSIS — R197 Diarrhea, unspecified: Secondary | ICD-10-CM | POA: Diagnosis not present

## 2016-08-28 DIAGNOSIS — D649 Anemia, unspecified: Secondary | ICD-10-CM | POA: Diagnosis not present

## 2016-09-03 DIAGNOSIS — H353132 Nonexudative age-related macular degeneration, bilateral, intermediate dry stage: Secondary | ICD-10-CM | POA: Diagnosis not present

## 2016-09-03 DIAGNOSIS — Z961 Presence of intraocular lens: Secondary | ICD-10-CM | POA: Diagnosis not present

## 2016-09-03 DIAGNOSIS — H04123 Dry eye syndrome of bilateral lacrimal glands: Secondary | ICD-10-CM | POA: Diagnosis not present

## 2016-09-03 DIAGNOSIS — H401133 Primary open-angle glaucoma, bilateral, severe stage: Secondary | ICD-10-CM | POA: Diagnosis not present

## 2016-09-03 DIAGNOSIS — H348322 Tributary (branch) retinal vein occlusion, left eye, stable: Secondary | ICD-10-CM | POA: Diagnosis not present

## 2016-09-03 DIAGNOSIS — E119 Type 2 diabetes mellitus without complications: Secondary | ICD-10-CM | POA: Diagnosis not present

## 2016-09-07 DIAGNOSIS — Z6826 Body mass index (BMI) 26.0-26.9, adult: Secondary | ICD-10-CM | POA: Diagnosis not present

## 2016-09-07 DIAGNOSIS — L600 Ingrowing nail: Secondary | ICD-10-CM | POA: Diagnosis not present

## 2016-09-09 DIAGNOSIS — Z7982 Long term (current) use of aspirin: Secondary | ICD-10-CM | POA: Diagnosis not present

## 2016-09-09 DIAGNOSIS — R159 Full incontinence of feces: Secondary | ICD-10-CM | POA: Diagnosis not present

## 2016-09-09 DIAGNOSIS — R197 Diarrhea, unspecified: Secondary | ICD-10-CM | POA: Diagnosis not present

## 2016-09-09 DIAGNOSIS — N3000 Acute cystitis without hematuria: Secondary | ICD-10-CM | POA: Diagnosis not present

## 2016-09-09 DIAGNOSIS — Z79899 Other long term (current) drug therapy: Secondary | ICD-10-CM | POA: Diagnosis not present

## 2016-09-09 DIAGNOSIS — N183 Chronic kidney disease, stage 3 (moderate): Secondary | ICD-10-CM | POA: Diagnosis not present

## 2016-09-09 DIAGNOSIS — K5730 Diverticulosis of large intestine without perforation or abscess without bleeding: Secondary | ICD-10-CM | POA: Diagnosis not present

## 2016-09-09 DIAGNOSIS — E1122 Type 2 diabetes mellitus with diabetic chronic kidney disease: Secondary | ICD-10-CM | POA: Diagnosis not present

## 2016-09-13 DIAGNOSIS — E119 Type 2 diabetes mellitus without complications: Secondary | ICD-10-CM | POA: Diagnosis not present

## 2016-09-13 DIAGNOSIS — L600 Ingrowing nail: Secondary | ICD-10-CM | POA: Diagnosis not present

## 2016-09-25 DIAGNOSIS — K589 Irritable bowel syndrome without diarrhea: Secondary | ICD-10-CM | POA: Diagnosis not present

## 2016-10-02 DIAGNOSIS — M5431 Sciatica, right side: Secondary | ICD-10-CM | POA: Diagnosis not present

## 2016-10-02 DIAGNOSIS — M25551 Pain in right hip: Secondary | ICD-10-CM | POA: Diagnosis not present

## 2016-10-03 DIAGNOSIS — I509 Heart failure, unspecified: Secondary | ICD-10-CM | POA: Diagnosis not present

## 2016-10-05 DIAGNOSIS — M25551 Pain in right hip: Secondary | ICD-10-CM | POA: Diagnosis not present

## 2016-10-05 DIAGNOSIS — M79661 Pain in right lower leg: Secondary | ICD-10-CM | POA: Diagnosis not present

## 2016-10-05 DIAGNOSIS — R8290 Unspecified abnormal findings in urine: Secondary | ICD-10-CM | POA: Diagnosis not present

## 2016-10-05 DIAGNOSIS — M79604 Pain in right leg: Secondary | ICD-10-CM | POA: Diagnosis not present

## 2016-10-06 DIAGNOSIS — M25551 Pain in right hip: Secondary | ICD-10-CM | POA: Diagnosis not present

## 2016-10-06 DIAGNOSIS — M79661 Pain in right lower leg: Secondary | ICD-10-CM | POA: Diagnosis not present

## 2016-10-08 DIAGNOSIS — Z9181 History of falling: Secondary | ICD-10-CM | POA: Diagnosis not present

## 2016-10-08 DIAGNOSIS — Z0000 Encounter for general adult medical examination without abnormal findings: Secondary | ICD-10-CM | POA: Diagnosis not present

## 2016-10-08 DIAGNOSIS — Z1389 Encounter for screening for other disorder: Secondary | ICD-10-CM | POA: Diagnosis not present

## 2016-10-08 DIAGNOSIS — Z23 Encounter for immunization: Secondary | ICD-10-CM | POA: Diagnosis not present

## 2016-10-08 DIAGNOSIS — M419 Scoliosis, unspecified: Secondary | ICD-10-CM | POA: Diagnosis not present

## 2016-10-11 DIAGNOSIS — R531 Weakness: Secondary | ICD-10-CM | POA: Diagnosis not present

## 2016-10-11 DIAGNOSIS — I6789 Other cerebrovascular disease: Secondary | ICD-10-CM | POA: Diagnosis not present

## 2016-10-11 DIAGNOSIS — R410 Disorientation, unspecified: Secondary | ICD-10-CM | POA: Diagnosis not present

## 2016-10-11 DIAGNOSIS — R4781 Slurred speech: Secondary | ICD-10-CM | POA: Diagnosis not present

## 2016-10-11 DIAGNOSIS — R0602 Shortness of breath: Secondary | ICD-10-CM | POA: Diagnosis not present

## 2016-10-11 DIAGNOSIS — I161 Hypertensive emergency: Secondary | ICD-10-CM | POA: Diagnosis not present

## 2016-10-11 DIAGNOSIS — G459 Transient cerebral ischemic attack, unspecified: Secondary | ICD-10-CM | POA: Diagnosis not present

## 2016-10-12 DIAGNOSIS — R410 Disorientation, unspecified: Secondary | ICD-10-CM | POA: Diagnosis not present

## 2016-10-12 DIAGNOSIS — Z66 Do not resuscitate: Secondary | ICD-10-CM | POA: Diagnosis present

## 2016-10-12 DIAGNOSIS — Z8679 Personal history of other diseases of the circulatory system: Secondary | ICD-10-CM | POA: Diagnosis not present

## 2016-10-12 DIAGNOSIS — I2510 Atherosclerotic heart disease of native coronary artery without angina pectoris: Secondary | ICD-10-CM | POA: Diagnosis not present

## 2016-10-12 DIAGNOSIS — Z79899 Other long term (current) drug therapy: Secondary | ICD-10-CM | POA: Diagnosis not present

## 2016-10-12 DIAGNOSIS — E1122 Type 2 diabetes mellitus with diabetic chronic kidney disease: Secondary | ICD-10-CM | POA: Diagnosis present

## 2016-10-12 DIAGNOSIS — G459 Transient cerebral ischemic attack, unspecified: Secondary | ICD-10-CM | POA: Diagnosis not present

## 2016-10-12 DIAGNOSIS — N183 Chronic kidney disease, stage 3 (moderate): Secondary | ICD-10-CM | POA: Diagnosis not present

## 2016-10-12 DIAGNOSIS — Z7401 Bed confinement status: Secondary | ICD-10-CM | POA: Diagnosis not present

## 2016-10-12 DIAGNOSIS — R0602 Shortness of breath: Secondary | ICD-10-CM | POA: Diagnosis not present

## 2016-10-12 DIAGNOSIS — E785 Hyperlipidemia, unspecified: Secondary | ICD-10-CM | POA: Diagnosis present

## 2016-10-12 DIAGNOSIS — M545 Low back pain: Secondary | ICD-10-CM | POA: Diagnosis present

## 2016-10-12 DIAGNOSIS — K219 Gastro-esophageal reflux disease without esophagitis: Secondary | ICD-10-CM | POA: Diagnosis present

## 2016-10-12 DIAGNOSIS — R4182 Altered mental status, unspecified: Secondary | ICD-10-CM | POA: Diagnosis not present

## 2016-10-12 DIAGNOSIS — R197 Diarrhea, unspecified: Secondary | ICD-10-CM | POA: Diagnosis present

## 2016-10-12 DIAGNOSIS — I5030 Unspecified diastolic (congestive) heart failure: Secondary | ICD-10-CM | POA: Diagnosis not present

## 2016-10-12 DIAGNOSIS — I10 Essential (primary) hypertension: Secondary | ICD-10-CM | POA: Diagnosis not present

## 2016-10-12 DIAGNOSIS — I130 Hypertensive heart and chronic kidney disease with heart failure and stage 1 through stage 4 chronic kidney disease, or unspecified chronic kidney disease: Secondary | ICD-10-CM | POA: Diagnosis not present

## 2016-10-12 DIAGNOSIS — I674 Hypertensive encephalopathy: Secondary | ICD-10-CM | POA: Diagnosis not present

## 2016-10-12 DIAGNOSIS — R5381 Other malaise: Secondary | ICD-10-CM | POA: Diagnosis not present

## 2016-10-12 DIAGNOSIS — F0390 Unspecified dementia without behavioral disturbance: Secondary | ICD-10-CM | POA: Diagnosis present

## 2016-10-12 DIAGNOSIS — Z7982 Long term (current) use of aspirin: Secondary | ICD-10-CM | POA: Diagnosis not present

## 2016-10-12 DIAGNOSIS — R279 Unspecified lack of coordination: Secondary | ICD-10-CM | POA: Diagnosis not present

## 2016-10-12 DIAGNOSIS — I161 Hypertensive emergency: Secondary | ICD-10-CM | POA: Diagnosis not present

## 2016-10-12 DIAGNOSIS — E860 Dehydration: Secondary | ICD-10-CM | POA: Diagnosis present

## 2016-10-15 ENCOUNTER — Telehealth: Payer: Self-pay | Admitting: Emergency Medicine

## 2016-10-15 ENCOUNTER — Ambulatory Visit: Payer: Medicare Other | Admitting: Family Medicine

## 2016-10-15 NOTE — Telephone Encounter (Signed)
Spoke to pt, he stated that his mother is at [facility] for BP issues & will be discharged tomorrow. She is going to a rehab facility. I advised pt that once he gets an idea when she will be able to come in to give our office a call & we will get her scheduled.

## 2016-10-15 NOTE — Telephone Encounter (Signed)
Pts son called to cancel patients appt today because she is in the hospital. He asked that you return his call, he needs to speak to you. Thanks.

## 2016-10-15 NOTE — Progress Notes (Deleted)
[name] [address] [place], [place] [address] Phone: [phone] Subjective:      CC: Back pain  [id number]  [name] is a 83 y.o. female coming in with complaint of back pain.  Onset-  Location Duration-  Character- Aggravating factors- Reliving factors-  Therapies tried-  Severity-     Past Medical History:  Diagnosis Date  . CHF (congestive heart failure) (HCC)    Diastolic. Precipitated by Actos  . Chronic kidney disease    mild-moderate. GFR was 35 in 02/12  . Coronary artery disease 02/2010   abnormal nuclear stress test with mild ischemia in apical anterolateral wall.   . DM (diabetes mellitus) ([place])   . Dyslipidemia   . Hypertension   . Nasal polyps   . Unspecified hypertensive heart disease without heart failure    Past Surgical History:  Procedure Laterality Date  . APPENDECTOMY  1951  . CATARACT EXTRACTION, BILATERAL    . CHOLECYSTECTOMY    . GLAUCOMA SURGERY  10/29/2013  . hysterectomy - unknown type    . NECK MASS EXCISION     non cancerous  . TONSILLECTOMY     Social History   Social History  . Marital status: Married    Spouse name: N/A  . Number of children: 3  . Years of education: N/A   Occupational History  . retired    Social History Main Topics  . Smoking status: Never Smoker  . Smokeless tobacco: Never Used  . Alcohol use No  . Drug use: No  . Sexual activity: Not on file   Other Topics Concern  . Not on file   Social History Narrative  . No narrative on file   Allergies  Allergen Reactions  . Actos [Pioglitazone Hydrochloride]   . Felodipine Er   . Hctz [Hydrochlorothiazide]   . Lisinopril   . Penicillins   . Plendil [Felodipine]   . Clonidine Derivatives Rash  . Combigan [Brimonidine Tartrate-Timolol] Other (See Comments)    Eye redness and itchiness, blurred vision  . Dorzolamide Other (See Comments)    Increased eye pressure, blurred vision  . Monopril  [Fosinopril Sodium] Cough  . Norvasc [Amlodipine Besylate] Hives   Family History  Problem Relation Age of Onset  . Diabetes Unknown   . Hypertension Unknown   . Stroke Unknown   . Asthma Mother   . Allergic rhinitis Mother   . Congestive Heart Failure Mother   . Stroke Mother      Past medical history, social, surgical and family history all reviewed in electronic medical record.  No pertanent information unless stated regarding to the chief complaint.   Review of Systems:Review of systems updated and as accurate as of 10/15/16  No headache, visual changes, nausea, vomiting, diarrhea, constipation, dizziness, abdominal pain, skin rash, fevers, chills, night sweats, weight loss, swollen lymph nodes, body aches, joint swelling, muscle aches, chest pain, shortness of breath, mood changes.   Objective  There were no vitals taken for this visit. Systems examined below as of 10/15/16   General: No apparent distress alert and oriented x3 mood and affect normal, dressed appropriately.  HEENT: Pupils equal, extraocular movements intact  Respiratory: Patient's speak in full sentences and does not appear short of breath  Cardiovascular: No lower extremity edema, non tender, no erythema  Skin: Warm dry intact with no signs of infection or rash on extremities or on axial skeleton.  Abdomen: Soft nontender  Neuro: Cranial nerves II  through XII are intact, neurovascularly intact in all extremities with 2+ DTRs and 2+ pulses.  Lymph: No lymphadenopathy of posterior or anterior cervical chain or axillae bilaterally.  Gait normal with good balance and coordination.  MSK:  Non tender with full range of motion and good stability and symmetric strength and tone of shoulders, elbows, wrist, hip, knee and ankles bilaterally.     Impression and Recommendations:     This case required medical decision making of moderate complexity.      Note: This dictation was prepared with Dragon dictation  along with smaller phrase technology. Any transcriptional errors that result from this process are unintentional.

## 2016-10-16 DIAGNOSIS — N183 Chronic kidney disease, stage 3 (moderate): Secondary | ICD-10-CM | POA: Diagnosis not present

## 2016-10-16 DIAGNOSIS — R279 Unspecified lack of coordination: Secondary | ICD-10-CM | POA: Diagnosis not present

## 2016-10-16 DIAGNOSIS — Z8679 Personal history of other diseases of the circulatory system: Secondary | ICD-10-CM | POA: Diagnosis not present

## 2016-10-16 DIAGNOSIS — I2510 Atherosclerotic heart disease of native coronary artery without angina pectoris: Secondary | ICD-10-CM | POA: Diagnosis not present

## 2016-10-16 DIAGNOSIS — R5381 Other malaise: Secondary | ICD-10-CM | POA: Diagnosis not present

## 2016-10-16 DIAGNOSIS — I1310 Hypertensive heart and chronic kidney disease without heart failure, with stage 1 through stage 4 chronic kidney disease, or unspecified chronic kidney disease: Secondary | ICD-10-CM | POA: Diagnosis not present

## 2016-10-16 DIAGNOSIS — D649 Anemia, unspecified: Secondary | ICD-10-CM | POA: Diagnosis not present

## 2016-10-16 DIAGNOSIS — K219 Gastro-esophageal reflux disease without esophagitis: Secondary | ICD-10-CM | POA: Diagnosis not present

## 2016-10-16 DIAGNOSIS — R262 Difficulty in walking, not elsewhere classified: Secondary | ICD-10-CM | POA: Diagnosis not present

## 2016-10-16 DIAGNOSIS — I10 Essential (primary) hypertension: Secondary | ICD-10-CM | POA: Diagnosis not present

## 2016-10-16 DIAGNOSIS — Z7401 Bed confinement status: Secondary | ICD-10-CM | POA: Diagnosis not present

## 2016-10-16 DIAGNOSIS — R4182 Altered mental status, unspecified: Secondary | ICD-10-CM | POA: Diagnosis not present

## 2016-10-18 DIAGNOSIS — D649 Anemia, unspecified: Secondary | ICD-10-CM | POA: Diagnosis not present

## 2016-10-18 DIAGNOSIS — I1310 Hypertensive heart and chronic kidney disease without heart failure, with stage 1 through stage 4 chronic kidney disease, or unspecified chronic kidney disease: Secondary | ICD-10-CM | POA: Diagnosis not present

## 2016-10-18 DIAGNOSIS — R262 Difficulty in walking, not elsewhere classified: Secondary | ICD-10-CM | POA: Diagnosis not present

## 2016-10-18 DIAGNOSIS — I2510 Atherosclerotic heart disease of native coronary artery without angina pectoris: Secondary | ICD-10-CM | POA: Diagnosis not present

## 2016-10-27 DIAGNOSIS — M7989 Other specified soft tissue disorders: Secondary | ICD-10-CM | POA: Diagnosis not present

## 2016-10-27 DIAGNOSIS — I130 Hypertensive heart and chronic kidney disease with heart failure and stage 1 through stage 4 chronic kidney disease, or unspecified chronic kidney disease: Secondary | ICD-10-CM | POA: Diagnosis not present

## 2016-10-27 DIAGNOSIS — K219 Gastro-esophageal reflux disease without esophagitis: Secondary | ICD-10-CM | POA: Diagnosis not present

## 2016-10-27 DIAGNOSIS — Z9181 History of falling: Secondary | ICD-10-CM | POA: Diagnosis not present

## 2016-10-27 DIAGNOSIS — I2510 Atherosclerotic heart disease of native coronary artery without angina pectoris: Secondary | ICD-10-CM | POA: Diagnosis not present

## 2016-10-27 DIAGNOSIS — E1122 Type 2 diabetes mellitus with diabetic chronic kidney disease: Secondary | ICD-10-CM | POA: Diagnosis not present

## 2016-10-27 DIAGNOSIS — F0390 Unspecified dementia without behavioral disturbance: Secondary | ICD-10-CM | POA: Diagnosis not present

## 2016-10-27 DIAGNOSIS — M6281 Muscle weakness (generalized): Secondary | ICD-10-CM | POA: Diagnosis not present

## 2016-10-27 DIAGNOSIS — I5032 Chronic diastolic (congestive) heart failure: Secondary | ICD-10-CM | POA: Diagnosis not present

## 2016-10-27 DIAGNOSIS — E785 Hyperlipidemia, unspecified: Secondary | ICD-10-CM | POA: Diagnosis not present

## 2016-10-27 DIAGNOSIS — Z7982 Long term (current) use of aspirin: Secondary | ICD-10-CM | POA: Diagnosis not present

## 2016-10-27 DIAGNOSIS — H409 Unspecified glaucoma: Secondary | ICD-10-CM | POA: Diagnosis not present

## 2016-10-27 DIAGNOSIS — N183 Chronic kidney disease, stage 3 (moderate): Secondary | ICD-10-CM | POA: Diagnosis not present

## 2016-10-27 DIAGNOSIS — Z8701 Personal history of pneumonia (recurrent): Secondary | ICD-10-CM | POA: Diagnosis not present

## 2016-10-29 DIAGNOSIS — H409 Unspecified glaucoma: Secondary | ICD-10-CM | POA: Diagnosis not present

## 2016-10-29 DIAGNOSIS — E1122 Type 2 diabetes mellitus with diabetic chronic kidney disease: Secondary | ICD-10-CM | POA: Diagnosis not present

## 2016-10-29 DIAGNOSIS — I2510 Atherosclerotic heart disease of native coronary artery without angina pectoris: Secondary | ICD-10-CM | POA: Diagnosis not present

## 2016-10-29 DIAGNOSIS — K219 Gastro-esophageal reflux disease without esophagitis: Secondary | ICD-10-CM | POA: Diagnosis not present

## 2016-10-29 DIAGNOSIS — I130 Hypertensive heart and chronic kidney disease with heart failure and stage 1 through stage 4 chronic kidney disease, or unspecified chronic kidney disease: Secondary | ICD-10-CM | POA: Diagnosis not present

## 2016-10-29 DIAGNOSIS — F0390 Unspecified dementia without behavioral disturbance: Secondary | ICD-10-CM | POA: Diagnosis not present

## 2016-10-30 DIAGNOSIS — E1122 Type 2 diabetes mellitus with diabetic chronic kidney disease: Secondary | ICD-10-CM | POA: Diagnosis not present

## 2016-10-30 DIAGNOSIS — K219 Gastro-esophageal reflux disease without esophagitis: Secondary | ICD-10-CM | POA: Diagnosis not present

## 2016-10-30 DIAGNOSIS — H409 Unspecified glaucoma: Secondary | ICD-10-CM | POA: Diagnosis not present

## 2016-10-30 DIAGNOSIS — I130 Hypertensive heart and chronic kidney disease with heart failure and stage 1 through stage 4 chronic kidney disease, or unspecified chronic kidney disease: Secondary | ICD-10-CM | POA: Diagnosis not present

## 2016-10-30 DIAGNOSIS — I2510 Atherosclerotic heart disease of native coronary artery without angina pectoris: Secondary | ICD-10-CM | POA: Diagnosis not present

## 2016-10-30 DIAGNOSIS — F0390 Unspecified dementia without behavioral disturbance: Secondary | ICD-10-CM | POA: Diagnosis not present

## 2016-10-31 DIAGNOSIS — K219 Gastro-esophageal reflux disease without esophagitis: Secondary | ICD-10-CM | POA: Diagnosis not present

## 2016-10-31 DIAGNOSIS — F0390 Unspecified dementia without behavioral disturbance: Secondary | ICD-10-CM | POA: Diagnosis not present

## 2016-10-31 DIAGNOSIS — I130 Hypertensive heart and chronic kidney disease with heart failure and stage 1 through stage 4 chronic kidney disease, or unspecified chronic kidney disease: Secondary | ICD-10-CM | POA: Diagnosis not present

## 2016-10-31 DIAGNOSIS — I2510 Atherosclerotic heart disease of native coronary artery without angina pectoris: Secondary | ICD-10-CM | POA: Diagnosis not present

## 2016-10-31 DIAGNOSIS — H409 Unspecified glaucoma: Secondary | ICD-10-CM | POA: Diagnosis not present

## 2016-10-31 DIAGNOSIS — E1122 Type 2 diabetes mellitus with diabetic chronic kidney disease: Secondary | ICD-10-CM | POA: Diagnosis not present

## 2016-11-01 DIAGNOSIS — M810 Age-related osteoporosis without current pathological fracture: Secondary | ICD-10-CM | POA: Diagnosis not present

## 2016-11-01 DIAGNOSIS — I10 Essential (primary) hypertension: Secondary | ICD-10-CM | POA: Diagnosis not present

## 2016-11-01 DIAGNOSIS — Z6827 Body mass index (BMI) 27.0-27.9, adult: Secondary | ICD-10-CM | POA: Diagnosis not present

## 2016-11-01 DIAGNOSIS — Z09 Encounter for follow-up examination after completed treatment for conditions other than malignant neoplasm: Secondary | ICD-10-CM | POA: Diagnosis not present

## 2016-11-01 DIAGNOSIS — Z79899 Other long term (current) drug therapy: Secondary | ICD-10-CM | POA: Diagnosis not present

## 2016-11-01 DIAGNOSIS — I509 Heart failure, unspecified: Secondary | ICD-10-CM | POA: Diagnosis not present

## 2016-11-02 DIAGNOSIS — K219 Gastro-esophageal reflux disease without esophagitis: Secondary | ICD-10-CM | POA: Diagnosis not present

## 2016-11-02 DIAGNOSIS — E1122 Type 2 diabetes mellitus with diabetic chronic kidney disease: Secondary | ICD-10-CM | POA: Diagnosis not present

## 2016-11-02 DIAGNOSIS — I2510 Atherosclerotic heart disease of native coronary artery without angina pectoris: Secondary | ICD-10-CM | POA: Diagnosis not present

## 2016-11-02 DIAGNOSIS — I130 Hypertensive heart and chronic kidney disease with heart failure and stage 1 through stage 4 chronic kidney disease, or unspecified chronic kidney disease: Secondary | ICD-10-CM | POA: Diagnosis not present

## 2016-11-02 DIAGNOSIS — F0390 Unspecified dementia without behavioral disturbance: Secondary | ICD-10-CM | POA: Diagnosis not present

## 2016-11-02 DIAGNOSIS — H409 Unspecified glaucoma: Secondary | ICD-10-CM | POA: Diagnosis not present

## 2016-11-05 ENCOUNTER — Ambulatory Visit (INDEPENDENT_AMBULATORY_CARE_PROVIDER_SITE_OTHER)
Admission: RE | Admit: 2016-11-05 | Discharge: 2016-11-05 | Disposition: A | Discharge status: Home / Self Care | Payer: Medicare Other | Source: Ambulatory Visit | Attending: Family Medicine | Admitting: Family Medicine

## 2016-11-05 ENCOUNTER — Encounter: Payer: Self-pay | Admitting: Family Medicine

## 2016-11-05 ENCOUNTER — Ambulatory Visit (INDEPENDENT_AMBULATORY_CARE_PROVIDER_SITE_OTHER): Payer: Medicare Other | Admitting: Family Medicine

## 2016-11-05 ENCOUNTER — Other Ambulatory Visit: Payer: Self-pay | Admitting: *Deleted

## 2016-11-05 VITALS — BP 152/66 | HR 68 | Ht 60.0 in | Wt 147.0 lb

## 2016-11-05 DIAGNOSIS — M5441 Lumbago with sciatica, right side: Secondary | ICD-10-CM

## 2016-11-05 DIAGNOSIS — G8929 Other chronic pain: Secondary | ICD-10-CM

## 2016-11-05 DIAGNOSIS — I6521 Occlusion and stenosis of right carotid artery: Secondary | ICD-10-CM | POA: Diagnosis not present

## 2016-11-05 DIAGNOSIS — M5136 Other intervertebral disc degeneration, lumbar region: Secondary | ICD-10-CM | POA: Diagnosis not present

## 2016-11-05 DIAGNOSIS — M545 Low back pain: Secondary | ICD-10-CM | POA: Diagnosis not present

## 2016-11-05 DIAGNOSIS — N183 Chronic kidney disease, stage 3 (moderate): Secondary | ICD-10-CM | POA: Diagnosis not present

## 2016-11-05 DIAGNOSIS — M51369 Other intervertebral disc degeneration, lumbar region without mention of lumbar back pain or lower extremity pain: Secondary | ICD-10-CM | POA: Insufficient documentation

## 2016-11-05 DIAGNOSIS — M5416 Radiculopathy, lumbar region: Secondary | ICD-10-CM

## 2016-11-05 MED ORDER — GABAPENTIN 100 MG PO CAPS: 100.0000 mg | ORAL_CAPSULE | Freq: Every day | ORAL | 3 refills | Status: DC

## 2016-11-05 NOTE — Patient Instructions (Signed)
Good to see you  Heat 10 minutes then ice 10 minutes every 2-3 hours could help  pennsaid pinkie amount topically 2 times daily as needed.   Xray downstairs.  We will then get you an epidural and they will call you to schedule.  Once you know when the injection is going to be, then call us and schedule an appointment 2-3 weeks after to make sure you are doing well.

## 2016-11-05 NOTE — Assessment & Plan Note (Signed)
Patient does have degenerative disc disease. Discussed with patient at great length. We discussed icing regimen, home exercises, which activities to do a which ones to avoid. Patient is going to increase activity slowly. X-rays ordered today to further evaluate with me not being able to her other imaging. I do believe the weakness of the MRI from an outside facility and I do believe that likely patient would be do better with an epidural. Patient has multiple different medications I do not want to cause a medication of possible. Patient will be started on gabapentin 100 mg but I'll not be able to titrate up from there. Patient and will come back and see me again in 4 weeks to see how patient is responding.

## 2016-11-05 NOTE — Progress Notes (Signed)
[facility] [address] [place], [place] [address] Phone: [phone] Subjective:      CC: Back pain  [id number]  Kelly Ruiz is a 83 y.o. female coming in with complaint of back pain.Patient is here for further evaluation. Patient was to have an MRI of the back and was to bring in. Patient's MRI unfortunately was not being able to be seen because was not on the CD. Patient has had a chronic back pain for some time. Worse with activity. States that she can only walk now with a walker. Just like she has weakness in the legs. Radiation down both legs. Rates the severity of pain a 7 out of 10. Not responding over-the-counter medications.       Past Medical History:  Diagnosis Date  . CHF (congestive heart failure) (HCC)    Diastolic. Precipitated by Actos  . Chronic kidney disease    mild-moderate. GFR was 35 in 02/12  . Coronary artery disease 02/2010   abnormal nuclear stress test with mild ischemia in apical anterolateral wall.   . DM (diabetes mellitus) ([place])   . Dyslipidemia   . Hypertension   . Nasal polyps   . Unspecified hypertensive heart disease without heart failure    Past Surgical History:  Procedure Laterality Date  . APPENDECTOMY  1951  . CATARACT EXTRACTION, BILATERAL    . CHOLECYSTECTOMY    . GLAUCOMA SURGERY  10/29/2013  . hysterectomy - unknown type    . NECK MASS EXCISION     non cancerous  . TONSILLECTOMY     Social History   Social History  . Marital status: Married    Spouse name: N/A  . Number of children: 3  . Years of education: N/A   Occupational History  . retired    Social History Main Topics  . Smoking status: Never Smoker  . Smokeless tobacco: Never Used  . Alcohol use No  . Drug use: No  . Sexual activity: Not Asked   Other Topics Concern  . None   Social History Narrative  . None   Allergies  Allergen Reactions  . Actos [Pioglitazone Hydrochloride]   . Felodipine Er   . Hctz  [Hydrochlorothiazide]   . Lisinopril   . Penicillins   . Plendil [Felodipine]   . Clonidine Derivatives Rash  . Combigan [Brimonidine Tartrate-Timolol] Other (See Comments)    Eye redness and itchiness, blurred vision  . Dorzolamide Other (See Comments)    Increased eye pressure, blurred vision  . Monopril [Fosinopril Sodium] Cough  . Norvasc [Amlodipine Besylate] Hives   Family History  Problem Relation Age of Onset  . Diabetes Unknown   . Hypertension Unknown   . Stroke Unknown   . Asthma Mother   . Allergic rhinitis Mother   . Congestive Heart Failure Mother   . Stroke Mother      Past medical history, social, surgical and family history all reviewed in electronic medical record.  No pertanent information unless stated regarding to the chief complaint.   Review of Systems:Review of systems updated and as accurate as of 11/05/16  No headache, visual changes, nausea, vomiting, diarrhea, constipation, dizziness, abdominal pain, skin rash, fevers, chills, night sweats, weight loss, swollen lymph nodeschest pain, shortness of breath, mood changes. Positive muscle aches, body aches,  Objective  Blood pressure (!) 152/66, pulse 68, height 5' (1.524 m), weight 147 lb (66.7 kg), SpO2 97 %. Systems examined below as of 11/05/16   General:  No apparent distress alert and oriented x3 mood and affect normal, dressed appropriately.  HEENT: Pupils equal, extraocular movements intact  Respiratory: Patient's speak in full sentences and does not appear short of breath  Cardiovascular: No lower extremity edema, non tender, no erythema  Skin: Warm dry intact with no signs of infection or rash on extremities or on axial skeleton.  Abdomen: Soft nontender  Neuro: Cranial nerves II through XII are intact, neurovascularly intact in all extremities with 2+ DTRs and 2+ pulses.  Lymph: No lymphadenopathy of posterior or anterior cervical chain or axillae bilaterally.  Gait Walks with the aid of a  walker MSK:  Mild to moderate tender with mild limited range of motion and good stability and symmetric strength and tone of shoulders, elbows, wrist, hip, knee and ankles bilaterally. Severe osteophytic changes of multiple joints Patient's back exam shows significant kyphosis of the thoracic spine. Patient does have loss of lordosis of the lumbar spine with scoliosis noted. Patient does have some tightness on straight leg test but no radicular symptoms. Neurovascularly intact distally. Atrophy of the lower Jimmy's bilaterally. Good deep tendon reflexes still noted of the Achilles.     Impression and Recommendations:     This case required medical decision making of moderate complexity.      Note: This dictation was prepared with Dragon dictation along with smaller phrase technology. Any transcriptional errors that result from this process are unintentional.

## 2016-11-06 ENCOUNTER — Ambulatory Visit: Payer: Medicare Other | Admitting: Family Medicine

## 2016-11-06 DIAGNOSIS — I130 Hypertensive heart and chronic kidney disease with heart failure and stage 1 through stage 4 chronic kidney disease, or unspecified chronic kidney disease: Secondary | ICD-10-CM | POA: Diagnosis not present

## 2016-11-06 DIAGNOSIS — I2510 Atherosclerotic heart disease of native coronary artery without angina pectoris: Secondary | ICD-10-CM | POA: Diagnosis not present

## 2016-11-06 DIAGNOSIS — F0390 Unspecified dementia without behavioral disturbance: Secondary | ICD-10-CM | POA: Diagnosis not present

## 2016-11-06 DIAGNOSIS — K219 Gastro-esophageal reflux disease without esophagitis: Secondary | ICD-10-CM | POA: Diagnosis not present

## 2016-11-06 DIAGNOSIS — H409 Unspecified glaucoma: Secondary | ICD-10-CM | POA: Diagnosis not present

## 2016-11-06 DIAGNOSIS — E1122 Type 2 diabetes mellitus with diabetic chronic kidney disease: Secondary | ICD-10-CM | POA: Diagnosis not present

## 2016-11-07 ENCOUNTER — Telehealth: Payer: Self-pay | Admitting: Cardiology

## 2016-11-07 DIAGNOSIS — I130 Hypertensive heart and chronic kidney disease with heart failure and stage 1 through stage 4 chronic kidney disease, or unspecified chronic kidney disease: Secondary | ICD-10-CM | POA: Diagnosis not present

## 2016-11-07 DIAGNOSIS — I2510 Atherosclerotic heart disease of native coronary artery without angina pectoris: Secondary | ICD-10-CM | POA: Diagnosis not present

## 2016-11-07 DIAGNOSIS — I5032 Chronic diastolic (congestive) heart failure: Secondary | ICD-10-CM | POA: Diagnosis not present

## 2016-11-07 DIAGNOSIS — H409 Unspecified glaucoma: Secondary | ICD-10-CM | POA: Diagnosis not present

## 2016-11-07 DIAGNOSIS — F0390 Unspecified dementia without behavioral disturbance: Secondary | ICD-10-CM | POA: Diagnosis not present

## 2016-11-07 DIAGNOSIS — N184 Chronic kidney disease, stage 4 (severe): Secondary | ICD-10-CM | POA: Diagnosis not present

## 2016-11-07 DIAGNOSIS — N179 Acute kidney failure, unspecified: Secondary | ICD-10-CM | POA: Diagnosis not present

## 2016-11-07 DIAGNOSIS — K219 Gastro-esophageal reflux disease without esophagitis: Secondary | ICD-10-CM | POA: Diagnosis not present

## 2016-11-07 DIAGNOSIS — E1122 Type 2 diabetes mellitus with diabetic chronic kidney disease: Secondary | ICD-10-CM | POA: Diagnosis not present

## 2016-11-07 DIAGNOSIS — Z794 Long term (current) use of insulin: Secondary | ICD-10-CM | POA: Diagnosis not present

## 2016-11-07 NOTE — Telephone Encounter (Signed)
11/07/2016 Received faxed referral from [facility] for upcoming appointment with Kerin Ransom, PA-C. Records given to [facility].  cbr

## 2016-11-08 ENCOUNTER — Telehealth: Payer: Self-pay | Admitting: Family Medicine

## 2016-11-08 DIAGNOSIS — E1122 Type 2 diabetes mellitus with diabetic chronic kidney disease: Secondary | ICD-10-CM | POA: Diagnosis not present

## 2016-11-08 DIAGNOSIS — K219 Gastro-esophageal reflux disease without esophagitis: Secondary | ICD-10-CM | POA: Diagnosis not present

## 2016-11-08 DIAGNOSIS — I130 Hypertensive heart and chronic kidney disease with heart failure and stage 1 through stage 4 chronic kidney disease, or unspecified chronic kidney disease: Secondary | ICD-10-CM | POA: Diagnosis not present

## 2016-11-08 DIAGNOSIS — F0390 Unspecified dementia without behavioral disturbance: Secondary | ICD-10-CM | POA: Diagnosis not present

## 2016-11-08 DIAGNOSIS — I2510 Atherosclerotic heart disease of native coronary artery without angina pectoris: Secondary | ICD-10-CM | POA: Diagnosis not present

## 2016-11-08 DIAGNOSIS — H409 Unspecified glaucoma: Secondary | ICD-10-CM | POA: Diagnosis not present

## 2016-11-08 NOTE — Telephone Encounter (Signed)
States that patient was just prescribed gabapentin.  States she has taken one pill.  States went out for visit today and states that bottom lip was purple and slightly swollen.   Son states he is going to take patient off medication even though he was advised to consult with Tamala Julian first.  Is requesting patient to be called to follow up with want needs to be done.

## 2016-11-09 ENCOUNTER — Telehealth: Payer: Self-pay | Admitting: Cardiology

## 2016-11-09 NOTE — Telephone Encounter (Signed)
Spoke to pt's son & Dr. Tamala Julian agreed to stop gabapentin.

## 2016-11-09 NOTE — Telephone Encounter (Signed)
11/09/2016 Received faxed medical records from [facility] for upcoming appointment with Kerin Ransom, on 11/26/2016.  Records given to [facility]. cbr

## 2016-11-10 DIAGNOSIS — K219 Gastro-esophageal reflux disease without esophagitis: Secondary | ICD-10-CM | POA: Diagnosis not present

## 2016-11-10 DIAGNOSIS — H409 Unspecified glaucoma: Secondary | ICD-10-CM | POA: Diagnosis not present

## 2016-11-10 DIAGNOSIS — F0390 Unspecified dementia without behavioral disturbance: Secondary | ICD-10-CM | POA: Diagnosis not present

## 2016-11-10 DIAGNOSIS — I2510 Atherosclerotic heart disease of native coronary artery without angina pectoris: Secondary | ICD-10-CM | POA: Diagnosis not present

## 2016-11-10 DIAGNOSIS — E1122 Type 2 diabetes mellitus with diabetic chronic kidney disease: Secondary | ICD-10-CM | POA: Diagnosis not present

## 2016-11-10 DIAGNOSIS — I130 Hypertensive heart and chronic kidney disease with heart failure and stage 1 through stage 4 chronic kidney disease, or unspecified chronic kidney disease: Secondary | ICD-10-CM | POA: Diagnosis not present

## 2016-11-12 ENCOUNTER — Ambulatory Visit
Admission: RE | Admit: 2016-11-12 | Discharge: 2016-11-12 | Disposition: A | Discharge status: Home / Self Care | Payer: Medicare Other | Source: Ambulatory Visit | Attending: Family Medicine | Admitting: Family Medicine

## 2016-11-12 DIAGNOSIS — M5416 Radiculopathy, lumbar region: Secondary | ICD-10-CM

## 2016-11-12 DIAGNOSIS — M545 Low back pain: Secondary | ICD-10-CM | POA: Diagnosis not present

## 2016-11-12 MED ORDER — IOPAMIDOL (ISOVUE-M 200) INJECTION 41%
1.0000 mL | Freq: Once | INTRAMUSCULAR | Status: AC
  Administered 2016-11-12: 1 mL via EPIDURAL

## 2016-11-12 MED ORDER — METHYLPREDNISOLONE ACETATE 40 MG/ML INJ SUSP (RADIOLOG
120.0000 mg | Freq: Once | INTRAMUSCULAR | Status: AC
  Administered 2016-11-12: 120 mg via EPIDURAL

## 2016-11-12 NOTE — Discharge Instructions (Signed)

## 2016-11-13 ENCOUNTER — Telehealth: Payer: Self-pay | Admitting: *Deleted

## 2016-11-13 DIAGNOSIS — I2510 Atherosclerotic heart disease of native coronary artery without angina pectoris: Secondary | ICD-10-CM | POA: Diagnosis not present

## 2016-11-13 DIAGNOSIS — F0390 Unspecified dementia without behavioral disturbance: Secondary | ICD-10-CM | POA: Diagnosis not present

## 2016-11-13 DIAGNOSIS — I130 Hypertensive heart and chronic kidney disease with heart failure and stage 1 through stage 4 chronic kidney disease, or unspecified chronic kidney disease: Secondary | ICD-10-CM | POA: Diagnosis not present

## 2016-11-13 DIAGNOSIS — H409 Unspecified glaucoma: Secondary | ICD-10-CM | POA: Diagnosis not present

## 2016-11-13 DIAGNOSIS — E1122 Type 2 diabetes mellitus with diabetic chronic kidney disease: Secondary | ICD-10-CM | POA: Diagnosis not present

## 2016-11-13 DIAGNOSIS — K219 Gastro-esophageal reflux disease without esophagitis: Secondary | ICD-10-CM | POA: Diagnosis not present

## 2016-11-13 MED ORDER — TRAMADOL HCL 50 MG PO TABS: 50.0000 mg | ORAL_TABLET | Freq: Three times a day (TID) | ORAL | 0 refills | Status: DC | PRN

## 2016-11-13 NOTE — Telephone Encounter (Signed)
Spoke to pt's son he decided on the tramadol at this time. He stated that pt has not had good reactions to prednisone.  Tramadol sent into pharmacy.

## 2016-11-13 NOTE — Telephone Encounter (Signed)
lmovm for pt to return call.  

## 2016-11-13 NOTE — Telephone Encounter (Signed)
Pt's son called stating that pt went yesterday to [place] to have epidural. After the injection the pt was unable to walk. Pt's son had to put patient in a wheel chair to get her into & around the house. Pt's son states that she is still in a lot of pain this morning on right side into leg. Pt is unable to walk on her own.

## 2016-11-13 NOTE — Telephone Encounter (Signed)
I would either  Tramadol 50 mg 3 times a day as needed and hope it helps Or prednisone 40 mg daily for 5 days.  Or I cna see them If you can call [facility] and see if they have anything to offer.

## 2016-11-19 DIAGNOSIS — F0390 Unspecified dementia without behavioral disturbance: Secondary | ICD-10-CM | POA: Diagnosis not present

## 2016-11-19 DIAGNOSIS — I2510 Atherosclerotic heart disease of native coronary artery without angina pectoris: Secondary | ICD-10-CM | POA: Diagnosis not present

## 2016-11-19 DIAGNOSIS — H409 Unspecified glaucoma: Secondary | ICD-10-CM | POA: Diagnosis not present

## 2016-11-19 DIAGNOSIS — E1122 Type 2 diabetes mellitus with diabetic chronic kidney disease: Secondary | ICD-10-CM | POA: Diagnosis not present

## 2016-11-19 DIAGNOSIS — K219 Gastro-esophageal reflux disease without esophagitis: Secondary | ICD-10-CM | POA: Diagnosis not present

## 2016-11-19 DIAGNOSIS — I130 Hypertensive heart and chronic kidney disease with heart failure and stage 1 through stage 4 chronic kidney disease, or unspecified chronic kidney disease: Secondary | ICD-10-CM | POA: Diagnosis not present

## 2016-11-21 DIAGNOSIS — E1122 Type 2 diabetes mellitus with diabetic chronic kidney disease: Secondary | ICD-10-CM | POA: Diagnosis not present

## 2016-11-21 DIAGNOSIS — F0390 Unspecified dementia without behavioral disturbance: Secondary | ICD-10-CM | POA: Diagnosis not present

## 2016-11-21 DIAGNOSIS — I130 Hypertensive heart and chronic kidney disease with heart failure and stage 1 through stage 4 chronic kidney disease, or unspecified chronic kidney disease: Secondary | ICD-10-CM | POA: Diagnosis not present

## 2016-11-21 DIAGNOSIS — K219 Gastro-esophageal reflux disease without esophagitis: Secondary | ICD-10-CM | POA: Diagnosis not present

## 2016-11-21 DIAGNOSIS — H409 Unspecified glaucoma: Secondary | ICD-10-CM | POA: Diagnosis not present

## 2016-11-21 DIAGNOSIS — I2510 Atherosclerotic heart disease of native coronary artery without angina pectoris: Secondary | ICD-10-CM | POA: Diagnosis not present

## 2016-11-22 DIAGNOSIS — M184 Other bilateral secondary osteoarthritis of first carpometacarpal joints: Secondary | ICD-10-CM | POA: Diagnosis not present

## 2016-11-23 DIAGNOSIS — F0390 Unspecified dementia without behavioral disturbance: Secondary | ICD-10-CM | POA: Diagnosis not present

## 2016-11-23 DIAGNOSIS — K219 Gastro-esophageal reflux disease without esophagitis: Secondary | ICD-10-CM | POA: Diagnosis not present

## 2016-11-23 DIAGNOSIS — E1122 Type 2 diabetes mellitus with diabetic chronic kidney disease: Secondary | ICD-10-CM | POA: Diagnosis not present

## 2016-11-23 DIAGNOSIS — H409 Unspecified glaucoma: Secondary | ICD-10-CM | POA: Diagnosis not present

## 2016-11-23 DIAGNOSIS — I130 Hypertensive heart and chronic kidney disease with heart failure and stage 1 through stage 4 chronic kidney disease, or unspecified chronic kidney disease: Secondary | ICD-10-CM | POA: Diagnosis not present

## 2016-11-23 DIAGNOSIS — I2510 Atherosclerotic heart disease of native coronary artery without angina pectoris: Secondary | ICD-10-CM | POA: Diagnosis not present

## 2016-11-26 ENCOUNTER — Ambulatory Visit (INDEPENDENT_AMBULATORY_CARE_PROVIDER_SITE_OTHER): Payer: Medicare Other | Admitting: Cardiology

## 2016-11-26 ENCOUNTER — Encounter: Payer: Self-pay | Admitting: Family Medicine

## 2016-11-26 ENCOUNTER — Encounter: Payer: Self-pay | Admitting: Cardiology

## 2016-11-26 ENCOUNTER — Ambulatory Visit (INDEPENDENT_AMBULATORY_CARE_PROVIDER_SITE_OTHER): Payer: Medicare Other | Admitting: Family Medicine

## 2016-11-26 DIAGNOSIS — I6521 Occlusion and stenosis of right carotid artery: Secondary | ICD-10-CM | POA: Diagnosis not present

## 2016-11-26 DIAGNOSIS — I5032 Chronic diastolic (congestive) heart failure: Secondary | ICD-10-CM | POA: Diagnosis not present

## 2016-11-26 DIAGNOSIS — I10 Essential (primary) hypertension: Secondary | ICD-10-CM | POA: Diagnosis not present

## 2016-11-26 DIAGNOSIS — M5136 Other intervertebral disc degeneration, lumbar region: Secondary | ICD-10-CM | POA: Diagnosis not present

## 2016-11-26 DIAGNOSIS — N184 Chronic kidney disease, stage 4 (severe): Secondary | ICD-10-CM

## 2016-11-26 MED ORDER — CARVEDILOL 6.25 MG PO TABS: 6.2500 mg | ORAL_TABLET | Freq: Two times a day (BID) | ORAL | 3 refills | Status: DC

## 2016-11-26 MED ORDER — TRAMADOL HCL 50 MG PO TABS: 50.0000 mg | ORAL_TABLET | Freq: Every evening | ORAL | 0 refills | Status: DC | PRN

## 2016-11-26 NOTE — Progress Notes (Signed)
[name] [address] [place], [place] [address] Phone: [phone] Subjective:     CC: Low back pain.  BOF:[id number]  Kelly Ruiz is a 83 y.o. female coming in for follow up for back pain. She had an epidural but was unable to walk 3 days afterwards. She took the gabapentin once and she was awake all night. Patient feels like she is not made any significant improvement.  If anything seems to be worsening.  Daily activities.  Walking with the aid of a walker and not making significant other advancements.     Past Medical History:  Diagnosis Date  . CHF (congestive heart failure) (HCC)    Diastolic. Precipitated by Actos  . Chronic kidney disease    mild-moderate. GFR was 35 in 02/12  . Coronary artery disease 02/2010   abnormal nuclear stress test with mild ischemia in apical anterolateral wall.   . DM (diabetes mellitus) ([place])   . Dyslipidemia   . Hypertension   . Nasal polyps   . Unspecified hypertensive heart disease without heart failure    Past Surgical History:  Procedure Laterality Date  . APPENDECTOMY  1951  . CATARACT EXTRACTION, BILATERAL    . CHOLECYSTECTOMY    . GLAUCOMA SURGERY  10/29/2013  . hysterectomy - unknown type    . NECK MASS EXCISION     non cancerous  . TONSILLECTOMY     Social History   Socioeconomic History  . Marital status: Married    Spouse name: None  . Number of children: 3  . Years of education: None  . Highest education level: None  Social Needs  . Financial resource strain: None  . Food insecurity - worry: None  . Food insecurity - inability: None  . Transportation needs - medical: None  . Transportation needs - non-medical: None  Occupational History  . Occupation: retired  Tobacco Use  . Smoking status: Never Smoker  . Smokeless tobacco: Never Used  Substance and Sexual Activity  . Alcohol use: No    Alcohol/week: 0.0 oz  . Drug use: No  . Sexual activity: None  Other Topics  Concern  . None  Social History Narrative  . None   Allergies  Allergen Reactions  . Actos [Pioglitazone Hydrochloride]   . Felodipine Er   . Hctz [Hydrochlorothiazide]   . Lisinopril   . Penicillins   . Plendil [Felodipine]   . Amlodipine Besylate Other (See Comments) and Hives  . Brimonidine Tartrate Other (See Comments)    Eye redness and itchiness, blurred vision  . Clonidine Derivatives Rash  . Combigan [Brimonidine Tartrate-Timolol] Other (See Comments)    Eye redness and itchiness, blurred vision  . Dorzolamide Other (See Comments)    Increased eye pressure, blurred vision  . Monopril [Fosinopril Sodium] Cough  . Norvasc [Amlodipine Besylate] Hives   Family History  Problem Relation Age of Onset  . Diabetes Unknown   . Hypertension Unknown   . Stroke Unknown   . Asthma Mother   . Allergic rhinitis Mother   . Congestive Heart Failure Mother   . Stroke Mother      Past medical history, social, surgical and family history all reviewed in electronic medical record.  No pertanent information unless stated regarding to the chief complaint.   Review of Systems:Review of systems updated and as accurate as of 11/26/16  No headache, visual changes, nausea, vomiting, diarrhea, constipation, dizziness, abdominal pain, skin rash, fevers, chills, night sweats, weight loss,  swollen lymph nodes, chest pain, shortness of breath, mood changes.  Positive muscle aches, body aches  Objective  Blood pressure (!) 162/58, pulse 65, height 5' (1.524 m), weight 146 lb (66.2 kg), SpO2 98 %. Systems examined below as of 11/26/16   General: No apparent distress alert and oriented x3 mood and affect normal, dressed appropriately.  HEENT: Pupils equal, extraocular movements intact  Respiratory: Patient's speak in full sentences and does not appear short of breath  Cardiovascular: No lower extremity edema, non tender, no erythema  Skin: Warm dry intact with no signs of infection or rash on  extremities or on axial skeleton.  Abdomen: Soft nontender  Neuro: Cranial nerves II through XII are intact, neurovascularly intact in all extremities with 2+ DTRs and 2+ pulses.  Lymph: No lymphadenopathy of posterior or anterior cervical chain or axillae bilaterally.  Gait walking with the aid of a walker MSK: Mild tender with full range of motion and good stability and symmetric strength and tone of shoulders, elbows, wrist, hip, knee and ankles bilaterally.  Moderate to severe arthritic changes in multiple joints Back exam shows increase in kyphosis of the thoracic spine.  Patient does have loss of lordosis of the lumbar spine with degenerative scoliosis noted.  Significant tightness of the hamstrings bilaterally.  Unable to do [facility] test secondary to tightness.  Patient does have mild atrophy of the lower extremities bilaterally.  Deep tendon reflexes intact.  Dorsalis pedis pulses brisk and symmetric.   Impression and Recommendations:     This case required medical decision making of moderate complexity.      Note: This dictation was prepared with Dragon dictation along with smaller phrase technology. Any transcriptional errors that result from this process are unintentional.

## 2016-11-26 NOTE — Patient Instructions (Signed)
Good to see you  Ice is your friend Tramadol at night when needed try 1/2 tab first  Try the gabapentin 100mg  in AM for a week.  Then if not much side effect increase to 200mg  in AM.  Cardiology and kidney docs will be checking labs You should do well I hope See me again in 4-5 weeks  Happy holidays!

## 2016-11-26 NOTE — Progress Notes (Signed)
11/26/2016 Kelly Ruiz   06-13-1923  [id number]  Primary Physician Ronita Hipps, MD Primary Cardiologist: Dr Percival Spanish  HPI:  83 y/o female seen in the office today as a follow up from a recent hospitalization at [facility]. The pt was admitted with confusion. She was hypertensive and they though she might have had a stroke but apparently her MRI did not show this to be the case. She was in the ICU x 5 days according to the pt's son. She was discharged on Amlodipine and Hydralazine 100 mg TID. Later the son tells me her Hydralazine was cut but and her Amlodipine stopped secondary to hypotension. [name] stopped the pt's Bumex because she felt the pt was dehydrated. She is now stage 4 with a GFR less than 30. The pt denies any SOB. She pretty much defers to her son to provide and history.    Current Outpatient Medications  Medication Sig Dispense Refill  . acetaminophen (TYLENOL) 500 MG tablet Take 500 mg by mouth 2 (two) times daily.    [name] atorvastatin (LIPITOR) 10 MG tablet Take 10 mg by mouth daily.    . brinzolamide (AZOPT) 1 % ophthalmic suspension 1 drop 2 (two) times daily.    . Calcium Carbonate-Vitamin D (CALCIUM-VITAMIN D) 500-200 MG-UNIT per tablet Take 1 tablet by mouth 2 (two) times daily with a meal.    . carvedilol (COREG) 12.5 MG tablet Take 1 tablet (12.5 mg total) by mouth 2 (two) times daily with a meal. 60 tablet 6  . cyanocobalamin 1000 MCG tablet Take 2,000 mcg by mouth daily.     [name] dicyclomine (BENTYL) 10 MG capsule Take 10 mg by mouth as needed.     . ferrous sulfate 325 (65 FE) MG tablet Take 325 mg by mouth daily with breakfast.    . fluticasone (FLONASE) 50 MCG/ACT nasal spray Place 2 sprays into both nostrils 2 (two) times daily.    [name] gabapentin (NEURONTIN) 100 MG capsule Take 1 capsule (100 mg total) by mouth at bedtime. 30 capsule 3  . hydrALAZINE (APRESOLINE) 50 MG tablet Take 50 mg 3 (three) times daily by mouth.     . isosorbide mononitrate  (IMDUR) 120 MG 24 hr tablet Take 2 tablets (240 mg total) by mouth daily. 180 tablet 3  . latanoprost (XALATAN) 0.005 % ophthalmic solution     . losartan (COZAAR) 50 MG tablet Take 50 mg by mouth daily.     . montelukast (SINGULAIR) 10 MG tablet Take 10 mg by mouth at bedtime.    . Multiple Vitamin (MULTIVITAMIN) tablet Take 1 tablet by mouth daily.    . Multiple Vitamins-Minerals (EYE VITAMINS PO) Take by mouth 2 (two) times daily.    . nitroGLYCERIN (NITROSTAT) 0.4 MG SL tablet Place 1 tablet (0.4 mg total) under the tongue every 5 (five) minutes as needed for chest pain. 25 tablet 3  . Omega-3 Fatty Acids (FISH OIL) 1200 MG CAPS Take 1 capsule by mouth daily.    [name] omeprazole (PRILOSEC) 20 MG capsule Take 20 mg by mouth daily.    . timolol (TIMOPTIC) 0.5 % ophthalmic solution Place 1 drop into the right eye 2 (two) times daily.     . traMADol (ULTRAM) 50 MG tablet Take 1 tablet (50 mg total) at bedtime as needed by mouth. 30 tablet 0  . vitamin C (ASCORBIC ACID) 500 MG tablet Take 500 mg by mouth daily.    . bumetanide (BUMEX) 1 MG tablet Take  0.5 tablets by mouth 2 (two) times daily.    . carvedilol (COREG) 6.25 MG tablet Take 1 tablet (6.25 mg total) 2 (two) times daily by mouth. Take with 12.5 mg to make 18.75mg  180 tablet 3   No current facility-administered medications for this visit.     Allergies  Allergen Reactions  . Actos [Pioglitazone Hydrochloride]   . Felodipine Er   . Hctz [Hydrochlorothiazide]   . Lisinopril   . Penicillins   . Plendil [Felodipine]   . Amlodipine Besylate Other (See Comments) and Hives  . Brimonidine Tartrate Other (See Comments)    Eye redness and itchiness, blurred vision  . Clonidine Derivatives Rash  . Combigan [Brimonidine Tartrate-Timolol] Other (See Comments)    Eye redness and itchiness, blurred vision  . Dorzolamide Other (See Comments)    Increased eye pressure, blurred vision  . Monopril [Fosinopril Sodium] Cough  . Norvasc [Amlodipine  Besylate] Hives    Past Medical History:  Diagnosis Date  . CHF (congestive heart failure) (HCC)    Diastolic. Precipitated by Actos  . Chronic kidney disease    mild-moderate. GFR was 35 in 02/12  . Coronary artery disease 02/2010   abnormal nuclear stress test with mild ischemia in apical anterolateral wall.   . DM (diabetes mellitus) ([place])   . Dyslipidemia   . Hypertension   . Nasal polyps   . Unspecified hypertensive heart disease without heart failure     Social History   Socioeconomic History  . Marital status: Married    Spouse name: Not on file  . Number of children: 3  . Years of education: Not on file  . Highest education level: Not on file  Social Needs  . Financial resource strain: Not on file  . Food insecurity - worry: Not on file  . Food insecurity - inability: Not on file  . Transportation needs - medical: Not on file  . Transportation needs - non-medical: Not on file  Occupational History  . Occupation: retired  Tobacco Use  . Smoking status: Never Smoker  . Smokeless tobacco: Never Used  Substance and Sexual Activity  . Alcohol use: No    Alcohol/week: 0.0 oz  . Drug use: No  . Sexual activity: Not on file  Other Topics Concern  . Not on file  Social History Narrative  . Not on file     Family History  Problem Relation Age of Onset  . Diabetes Unknown   . Hypertension Unknown   . Stroke Unknown   . Asthma Mother   . Allergic rhinitis Mother   . Congestive Heart Failure Mother   . Stroke Mother      Review of Systems: General: negative for chills, fever, night sweats or weight changes.  Cardiovascular: negative for chest pain, dyspnea on exertion, edema, orthopnea, palpitations, paroxysmal nocturnal dyspnea or shortness of breath Dermatological: negative for rash Respiratory: negative for cough or wheezing Urologic: negative for hematuria Abdominal: negative for nausea, vomiting, diarrhea, bright red blood per rectum, melena, or  hematemesis Neurologic: negative for visual changes, syncope, or dizziness Chronic back pain secondary to DJD All other systems reviewed and are otherwise negative except as noted above.    Blood pressure (!) 168/61, pulse 65, height 5' (1.524 m), weight 146 lb 12.8 oz (66.6 kg).  General appearance: alert, cooperative, no distress and mildly obese Neck: no JVD Lungs: clear to auscultation bilaterally and kyphosis Heart: regular rate and rhythm Extremities: trace edema Skin: pale cool dry Neurologic: Grossly  normal   ASSESSMENT AND PLAN:   Chronic diastolic congestive heart failure (HCC) Currently seems volume stable  Essential hypertension Admitted to [facility] in Oct for HTN crisis with encephalopathy.  Chronic renal insufficiency, stage IV (severe) (HCC) Now stage 4, followed by [name] in [facility].  Diuretics recently stopped by [name]   PLAN  I suggested we increase her Coreg to 18.75 mg BID. Another option would be to try Hydralazine 75 mg TID. She has a follow up with her nephrologist tomorrow and I suggested they ask [name] about her diuretic.   The pt has intolerances to several medications including Ca++ blockers and I explained to the pt's son our options are becoming limited. At 83 y/o I doubt she would be a dialysis candidate. F/U Dr Percival Spanish in December.   Kerin Ransom PA-C 11/26/2016 2:12 PM

## 2016-11-26 NOTE — Assessment & Plan Note (Signed)
Currently seems volume stable

## 2016-11-26 NOTE — Assessment & Plan Note (Signed)
The patient does have degenerative changes of the lumbar spine.  Did not respond well to the epidural injection.  Patient will try daytime dosing of gabapentin to see if this will be beneficial.  Tramadol low dose for nighttime relief.  Encourage patient to avoid other potential contributing factors.  We discussed staying hydrated and proper diet.  We discussed otherwise would have to be more aggressive.  Patient would like to try conservative therapy and follow-up with me again in 4 weeks

## 2016-11-26 NOTE — Assessment & Plan Note (Signed)
Now stage 4, followed by [name] in [facility].  Diuretics recently stopped by [name]

## 2016-11-26 NOTE — Assessment & Plan Note (Signed)
Admitted to [facility] in Oct for HTN crisis with encephalopathy.

## 2016-11-26 NOTE — Patient Instructions (Signed)
Medication Instructions:  TAKE-Carvedilol 6.25 mg along with 12.5 mg to make 18.75 mg  If you need a refill on your cardiac medications before your next appointment, please call your pharmacy.  Labwork: None Ordered   Testing/Procedures: None Ordered   Follow-Up: Your physician wants you to follow-up in: 1 Month with Dr Percival Spanish.    Thank you for choosing [facility] at [facility]!!

## 2016-11-27 ENCOUNTER — Telehealth: Payer: Self-pay | Admitting: Cardiology

## 2016-11-27 DIAGNOSIS — I5032 Chronic diastolic (congestive) heart failure: Secondary | ICD-10-CM | POA: Diagnosis not present

## 2016-11-27 DIAGNOSIS — H34212 Partial retinal artery occlusion, left eye: Secondary | ICD-10-CM | POA: Diagnosis not present

## 2016-11-27 DIAGNOSIS — E119 Type 2 diabetes mellitus without complications: Secondary | ICD-10-CM | POA: Diagnosis not present

## 2016-11-27 DIAGNOSIS — N184 Chronic kidney disease, stage 4 (severe): Secondary | ICD-10-CM | POA: Diagnosis not present

## 2016-11-27 DIAGNOSIS — H348322 Tributary (branch) retinal vein occlusion, left eye, stable: Secondary | ICD-10-CM | POA: Diagnosis not present

## 2016-11-27 DIAGNOSIS — E1122 Type 2 diabetes mellitus with diabetic chronic kidney disease: Secondary | ICD-10-CM | POA: Diagnosis not present

## 2016-11-27 DIAGNOSIS — N179 Acute kidney failure, unspecified: Secondary | ICD-10-CM | POA: Diagnosis not present

## 2016-11-27 DIAGNOSIS — H04123 Dry eye syndrome of bilateral lacrimal glands: Secondary | ICD-10-CM | POA: Diagnosis not present

## 2016-11-27 DIAGNOSIS — Z794 Long term (current) use of insulin: Secondary | ICD-10-CM | POA: Diagnosis not present

## 2016-11-27 DIAGNOSIS — H401133 Primary open-angle glaucoma, bilateral, severe stage: Secondary | ICD-10-CM | POA: Diagnosis not present

## 2016-11-27 DIAGNOSIS — Z961 Presence of intraocular lens: Secondary | ICD-10-CM | POA: Diagnosis not present

## 2016-11-27 DIAGNOSIS — H353132 Nonexudative age-related macular degeneration, bilateral, intermediate dry stage: Secondary | ICD-10-CM | POA: Diagnosis not present

## 2016-11-27 NOTE — Telephone Encounter (Signed)
Please

## 2016-11-29 DIAGNOSIS — I2510 Atherosclerotic heart disease of native coronary artery without angina pectoris: Secondary | ICD-10-CM | POA: Diagnosis not present

## 2016-11-29 DIAGNOSIS — F0390 Unspecified dementia without behavioral disturbance: Secondary | ICD-10-CM | POA: Diagnosis not present

## 2016-11-29 DIAGNOSIS — I130 Hypertensive heart and chronic kidney disease with heart failure and stage 1 through stage 4 chronic kidney disease, or unspecified chronic kidney disease: Secondary | ICD-10-CM | POA: Diagnosis not present

## 2016-11-29 DIAGNOSIS — E1122 Type 2 diabetes mellitus with diabetic chronic kidney disease: Secondary | ICD-10-CM | POA: Diagnosis not present

## 2016-11-29 DIAGNOSIS — H409 Unspecified glaucoma: Secondary | ICD-10-CM | POA: Diagnosis not present

## 2016-11-29 DIAGNOSIS — K219 Gastro-esophageal reflux disease without esophagitis: Secondary | ICD-10-CM | POA: Diagnosis not present

## 2016-11-29 NOTE — Telephone Encounter (Signed)
I did not need this encounter,made an appointment for pt.

## 2016-12-03 DIAGNOSIS — D649 Anemia, unspecified: Secondary | ICD-10-CM | POA: Diagnosis not present

## 2016-12-03 DIAGNOSIS — F0390 Unspecified dementia without behavioral disturbance: Secondary | ICD-10-CM | POA: Diagnosis not present

## 2016-12-03 DIAGNOSIS — D510 Vitamin B12 deficiency anemia due to intrinsic factor deficiency: Secondary | ICD-10-CM | POA: Diagnosis not present

## 2016-12-03 DIAGNOSIS — I130 Hypertensive heart and chronic kidney disease with heart failure and stage 1 through stage 4 chronic kidney disease, or unspecified chronic kidney disease: Secondary | ICD-10-CM | POA: Diagnosis not present

## 2016-12-03 DIAGNOSIS — I2510 Atherosclerotic heart disease of native coronary artery without angina pectoris: Secondary | ICD-10-CM | POA: Diagnosis not present

## 2016-12-03 DIAGNOSIS — H409 Unspecified glaucoma: Secondary | ICD-10-CM | POA: Diagnosis not present

## 2016-12-03 DIAGNOSIS — K219 Gastro-esophageal reflux disease without esophagitis: Secondary | ICD-10-CM | POA: Diagnosis not present

## 2016-12-03 DIAGNOSIS — E1122 Type 2 diabetes mellitus with diabetic chronic kidney disease: Secondary | ICD-10-CM | POA: Diagnosis not present

## 2016-12-04 ENCOUNTER — Inpatient Hospital Stay (HOSPITAL_COMMUNITY)
Admission: EM | Admit: 2016-12-04 | Discharge: 2016-12-07 | DRG: 291 | Disposition: A | Discharge status: Home Health | Payer: Medicare Other | Attending: Family Medicine | Admitting: Family Medicine

## 2016-12-04 ENCOUNTER — Encounter (HOSPITAL_COMMUNITY): Payer: Self-pay | Admitting: Emergency Medicine

## 2016-12-04 ENCOUNTER — Other Ambulatory Visit: Payer: Self-pay

## 2016-12-04 ENCOUNTER — Telehealth: Payer: Self-pay | Admitting: Cardiology

## 2016-12-04 DIAGNOSIS — Z7951 Long term (current) use of inhaled steroids: Secondary | ICD-10-CM

## 2016-12-04 DIAGNOSIS — E1122 Type 2 diabetes mellitus with diabetic chronic kidney disease: Secondary | ICD-10-CM | POA: Diagnosis present

## 2016-12-04 DIAGNOSIS — K219 Gastro-esophageal reflux disease without esophagitis: Secondary | ICD-10-CM | POA: Diagnosis present

## 2016-12-04 DIAGNOSIS — E875 Hyperkalemia: Secondary | ICD-10-CM

## 2016-12-04 DIAGNOSIS — Z794 Long term (current) use of insulin: Secondary | ICD-10-CM

## 2016-12-04 DIAGNOSIS — I130 Hypertensive heart and chronic kidney disease with heart failure and stage 1 through stage 4 chronic kidney disease, or unspecified chronic kidney disease: Principal | ICD-10-CM | POA: Diagnosis present

## 2016-12-04 DIAGNOSIS — D649 Anemia, unspecified: Secondary | ICD-10-CM

## 2016-12-04 DIAGNOSIS — I5033 Acute on chronic diastolic (congestive) heart failure: Secondary | ICD-10-CM | POA: Diagnosis not present

## 2016-12-04 DIAGNOSIS — E785 Hyperlipidemia, unspecified: Secondary | ICD-10-CM | POA: Diagnosis not present

## 2016-12-04 DIAGNOSIS — Z6828 Body mass index (BMI) 28.0-28.9, adult: Secondary | ICD-10-CM

## 2016-12-04 DIAGNOSIS — Z9842 Cataract extraction status, left eye: Secondary | ICD-10-CM

## 2016-12-04 DIAGNOSIS — I5032 Chronic diastolic (congestive) heart failure: Secondary | ICD-10-CM | POA: Diagnosis present

## 2016-12-04 DIAGNOSIS — Z66 Do not resuscitate: Secondary | ICD-10-CM | POA: Diagnosis present

## 2016-12-04 DIAGNOSIS — Z823 Family history of stroke: Secondary | ICD-10-CM

## 2016-12-04 DIAGNOSIS — Z888 Allergy status to other drugs, medicaments and biological substances status: Secondary | ICD-10-CM

## 2016-12-04 DIAGNOSIS — E43 Unspecified severe protein-calorie malnutrition: Secondary | ICD-10-CM | POA: Diagnosis not present

## 2016-12-04 DIAGNOSIS — I2510 Atherosclerotic heart disease of native coronary artery without angina pectoris: Secondary | ICD-10-CM | POA: Diagnosis present

## 2016-12-04 DIAGNOSIS — Z9049 Acquired absence of other specified parts of digestive tract: Secondary | ICD-10-CM

## 2016-12-04 DIAGNOSIS — E1129 Type 2 diabetes mellitus with other diabetic kidney complication: Secondary | ICD-10-CM

## 2016-12-04 DIAGNOSIS — N184 Chronic kidney disease, stage 4 (severe): Secondary | ICD-10-CM | POA: Diagnosis not present

## 2016-12-04 DIAGNOSIS — R195 Other fecal abnormalities: Secondary | ICD-10-CM | POA: Diagnosis not present

## 2016-12-04 DIAGNOSIS — Z90710 Acquired absence of both cervix and uterus: Secondary | ICD-10-CM

## 2016-12-04 DIAGNOSIS — H409 Unspecified glaucoma: Secondary | ICD-10-CM | POA: Diagnosis present

## 2016-12-04 DIAGNOSIS — Z9841 Cataract extraction status, right eye: Secondary | ICD-10-CM

## 2016-12-04 DIAGNOSIS — Z880 Allergy status to penicillin: Secondary | ICD-10-CM

## 2016-12-04 DIAGNOSIS — D539 Nutritional anemia, unspecified: Secondary | ICD-10-CM | POA: Diagnosis not present

## 2016-12-04 DIAGNOSIS — D509 Iron deficiency anemia, unspecified: Secondary | ICD-10-CM | POA: Diagnosis present

## 2016-12-04 DIAGNOSIS — Z8249 Family history of ischemic heart disease and other diseases of the circulatory system: Secondary | ICD-10-CM

## 2016-12-04 DIAGNOSIS — Z1212 Encounter for screening for malignant neoplasm of rectum: Secondary | ICD-10-CM | POA: Diagnosis not present

## 2016-12-04 DIAGNOSIS — Z825 Family history of asthma and other chronic lower respiratory diseases: Secondary | ICD-10-CM

## 2016-12-04 LAB — CBC
HCT: 24.4 % — ABNORMAL LOW (ref 36.0–46.0)
Hemoglobin: 7.6 g/dL — ABNORMAL LOW (ref 12.0–15.0)
MCH: 32.1 pg (ref 26.0–34.0)
MCHC: 31.1 g/dL (ref 30.0–36.0)
MCV: 103 fL — ABNORMAL HIGH (ref 78.0–100.0)
PLATELETS: 150 10*3/uL (ref 150–400)
RBC: 2.37 MIL/uL — AB (ref 3.87–5.11)
RDW: 16 % — AB (ref 11.5–15.5)
WBC: 7.6 10*3/uL (ref 4.0–10.5)

## 2016-12-04 LAB — RETICULOCYTES
RBC.: 2.35 MIL/uL — AB (ref 3.87–5.11)
RETIC CT PCT: 5.2 % — AB (ref 0.4–3.1)
Retic Count, Absolute: 122.2 10*3/uL (ref 19.0–186.0)

## 2016-12-04 LAB — COMPREHENSIVE METABOLIC PANEL
ALK PHOS: 82 U/L (ref 38–126)
ALT: 29 U/L (ref 14–54)
AST: 18 U/L (ref 15–41)
Albumin: 2.5 g/dL — ABNORMAL LOW (ref 3.5–5.0)
Anion gap: 7 (ref 5–15)
BUN: 70 mg/dL — AB (ref 6–20)
CALCIUM: 8.8 mg/dL — AB (ref 8.9–10.3)
CO2: 18 mmol/L — AB (ref 22–32)
CREATININE: 1.89 mg/dL — AB (ref 0.44–1.00)
Chloride: 111 mmol/L (ref 101–111)
GFR calc non Af Amer: 22 mL/min — ABNORMAL LOW (ref >60)
GFR, EST AFRICAN AMERICAN: 26 mL/min — AB (ref >60)
Glucose, Bld: 128 mg/dL — ABNORMAL HIGH (ref 65–99)
Potassium: 5.4 mmol/L — ABNORMAL HIGH (ref 3.5–5.1)
SODIUM: 136 mmol/L (ref 135–145)
Total Bilirubin: 0.3 mg/dL (ref 0.3–1.2)
Total Protein: 4.7 g/dL — ABNORMAL LOW (ref 6.5–8.1)

## 2016-12-04 LAB — PROTIME-INR
INR: 1.03
Prothrombin Time: 13.4 seconds (ref 11.4–15.2)

## 2016-12-04 LAB — PREPARE RBC (CROSSMATCH)

## 2016-12-04 LAB — ABO/RH: ABO/RH(D): B POS

## 2016-12-04 LAB — APTT: APTT: 29 s (ref 24–36)

## 2016-12-04 MED ORDER — LOSARTAN POTASSIUM 50 MG PO TABS
50.0000 mg | ORAL_TABLET | Freq: Every day | ORAL | Status: DC
  Administered 2016-12-04: 50 mg via ORAL
  Filled 2016-12-04: qty 1

## 2016-12-04 MED ORDER — DIPHENOXYLATE-ATROPINE 2.5-0.025 MG PO TABS
1.0000 | ORAL_TABLET | Freq: Three times a day (TID) | ORAL | Status: DC
Start: 2016-12-04 — End: 2016-12-07
  Administered 2016-12-04 – 2016-12-07 (×8): 1 via ORAL
  Filled 2016-12-04 (×8): qty 1

## 2016-12-04 MED ORDER — BRINZOLAMIDE 1 % OP SUSP
1.0000 [drp] | Freq: Two times a day (BID) | OPHTHALMIC | Status: DC
  Administered 2016-12-04 – 2016-12-07 (×6): 1 [drp] via OPHTHALMIC
  Filled 2016-12-04: qty 10

## 2016-12-04 MED ORDER — ATORVASTATIN CALCIUM 10 MG PO TABS
10.0000 mg | ORAL_TABLET | Freq: Every day | ORAL | Status: DC
  Administered 2016-12-04 – 2016-12-06 (×3): 10 mg via ORAL
  Filled 2016-12-04 (×3): qty 1

## 2016-12-04 MED ORDER — SODIUM BICARBONATE 8.4 % IV SOLN
50.0000 meq | Freq: Once | INTRAVENOUS | Status: AC
  Administered 2016-12-04: 50 meq via INTRAVENOUS
  Filled 2016-12-04: qty 50

## 2016-12-04 MED ORDER — FERROUS SULFATE 325 (65 FE) MG PO TABS: 325.0000 mg | ORAL_TABLET | Freq: Two times a day (BID) | ORAL | Status: DC

## 2016-12-04 MED ORDER — SODIUM CHLORIDE 0.9% FLUSH
3.0000 mL | Freq: Two times a day (BID) | INTRAVENOUS | Status: DC
  Administered 2016-12-04 – 2016-12-07 (×6): 3 mL via INTRAVENOUS

## 2016-12-04 MED ORDER — PANTOPRAZOLE SODIUM 40 MG PO TBEC
40.0000 mg | DELAYED_RELEASE_TABLET | Freq: Every day | ORAL | Status: DC
  Administered 2016-12-05 – 2016-12-07 (×3): 40 mg via ORAL
  Filled 2016-12-04 (×3): qty 1

## 2016-12-04 MED ORDER — HYDRALAZINE HCL 50 MG PO TABS
50.0000 mg | ORAL_TABLET | Freq: Two times a day (BID) | ORAL | Status: DC
  Administered 2016-12-04 – 2016-12-06 (×5): 50 mg via ORAL
  Filled 2016-12-04 (×5): qty 1

## 2016-12-04 MED ORDER — TRAMADOL HCL 50 MG PO TABS
50.0000 mg | ORAL_TABLET | Freq: Every evening | ORAL | Status: DC | PRN
Start: 2016-12-04 — End: 2016-12-07

## 2016-12-04 MED ORDER — PREDNISOLONE ACETATE 1 % OP SUSP
1.0000 [drp] | Freq: Every day | OPHTHALMIC | Status: DC
  Administered 2016-12-05 – 2016-12-07 (×3): 1 [drp] via OPHTHALMIC
  Filled 2016-12-04: qty 1

## 2016-12-04 MED ORDER — VITAMIN B-12 1000 MCG PO TABS
1000.0000 ug | ORAL_TABLET | Freq: Every day | ORAL | Status: DC
  Administered 2016-12-05 – 2016-12-07 (×3): 1000 ug via ORAL
  Filled 2016-12-04 (×4): qty 1

## 2016-12-04 MED ORDER — TRAZODONE HCL 150 MG PO TABS
75.0000 mg | ORAL_TABLET | Freq: Every day | ORAL | Status: DC
  Administered 2016-12-04 – 2016-12-06 (×3): 75 mg via ORAL
  Filled 2016-12-04 (×3): qty 1

## 2016-12-04 MED ORDER — ACETAMINOPHEN 325 MG PO TABS
650.0000 mg | ORAL_TABLET | Freq: Four times a day (QID) | ORAL | Status: DC | PRN
  Administered 2016-12-07: 650 mg via ORAL
  Filled 2016-12-04: qty 2

## 2016-12-04 MED ORDER — LATANOPROST 0.005 % OP SOLN
1.0000 [drp] | Freq: Every day | OPHTHALMIC | Status: DC
  Administered 2016-12-04 – 2016-12-06 (×3): 1 [drp] via OPHTHALMIC
  Filled 2016-12-04: qty 2.5

## 2016-12-04 MED ORDER — VITAMIN C 500 MG PO TABS
500.0000 mg | ORAL_TABLET | Freq: Two times a day (BID) | ORAL | Status: DC
  Administered 2016-12-04 – 2016-12-07 (×6): 500 mg via ORAL
  Filled 2016-12-04 (×6): qty 1

## 2016-12-04 MED ORDER — ISOSORBIDE MONONITRATE ER 60 MG PO TB24
240.0000 mg | ORAL_TABLET | Freq: Every day | ORAL | Status: DC
  Filled 2016-12-04: qty 4

## 2016-12-04 MED ORDER — SODIUM CHLORIDE 0.9 % IV SOLN: 250.0000 mL | INTRAVENOUS | Status: DC | PRN

## 2016-12-04 MED ORDER — BUMETANIDE 0.5 MG PO TABS
0.5000 mg | ORAL_TABLET | Freq: Two times a day (BID) | ORAL | Status: DC
Start: 2016-12-04 — End: 2016-12-05
  Administered 2016-12-04 – 2016-12-05 (×2): 0.5 mg via ORAL
  Filled 2016-12-04 (×2): qty 1

## 2016-12-04 MED ORDER — FUROSEMIDE 10 MG/ML IJ SOLN
20.0000 mg | Freq: Every day | INTRAMUSCULAR | Status: DC
  Administered 2016-12-04: 20 mg via INTRAVENOUS
  Filled 2016-12-04 (×2): qty 2

## 2016-12-04 MED ORDER — SODIUM CHLORIDE 0.9 % IV SOLN
10.0000 mL/h | Freq: Once | INTRAVENOUS | Status: AC
  Administered 2016-12-04: 10 mL/h via INTRAVENOUS

## 2016-12-04 MED ORDER — SODIUM CHLORIDE 0.9% FLUSH: 3.0000 mL | INTRAVENOUS | Status: DC | PRN

## 2016-12-04 MED ORDER — TIMOLOL MALEATE 0.5 % OP SOLN
1.0000 [drp] | Freq: Two times a day (BID) | OPHTHALMIC | Status: DC
  Administered 2016-12-04 – 2016-12-07 (×6): 1 [drp] via OPHTHALMIC
  Filled 2016-12-04: qty 5

## 2016-12-04 MED ORDER — FLUTICASONE PROPIONATE 50 MCG/ACT NA SUSP
2.0000 | Freq: Two times a day (BID) | NASAL | Status: DC
  Administered 2016-12-05 – 2016-12-07 (×6): 2 via NASAL
  Filled 2016-12-04: qty 16

## 2016-12-04 MED ORDER — MONTELUKAST SODIUM 10 MG PO TABS
10.0000 mg | ORAL_TABLET | Freq: Every day | ORAL | Status: DC
  Administered 2016-12-04 – 2016-12-06 (×3): 10 mg via ORAL
  Filled 2016-12-04 (×3): qty 1

## 2016-12-04 MED ORDER — SODIUM CHLORIDE 0.9 % IV SOLN
1.0000 g | Freq: Once | INTRAVENOUS | Status: AC
  Administered 2016-12-04: 1 g via INTRAVENOUS
  Filled 2016-12-04: qty 10

## 2016-12-04 MED ORDER — CARVEDILOL 12.5 MG PO TABS: 12.5000 mg | ORAL_TABLET | Freq: Two times a day (BID) | ORAL | Status: DC

## 2016-12-04 MED ORDER — ACETAMINOPHEN 650 MG RE SUPP: 650.0000 mg | Freq: Four times a day (QID) | RECTAL | Status: DC | PRN

## 2016-12-04 MED ORDER — DICYCLOMINE HCL 10 MG PO CAPS
10.0000 mg | ORAL_CAPSULE | Freq: Four times a day (QID) | ORAL | Status: DC
  Administered 2016-12-04 – 2016-12-07 (×9): 10 mg via ORAL
  Filled 2016-12-04 (×9): qty 1

## 2016-12-04 MED ORDER — ADULT MULTIVITAMIN W/MINERALS CH
1.0000 | ORAL_TABLET | Freq: Every day | ORAL | Status: DC
  Administered 2016-12-05 – 2016-12-07 (×3): 1 via ORAL
  Filled 2016-12-04 (×5): qty 1

## 2016-12-04 MED ORDER — SODIUM POLYSTYRENE SULFONATE 15 GM/60ML PO SUSP
15.0000 g | Freq: Once | ORAL | Status: AC
  Administered 2016-12-04: 15 g via ORAL
  Filled 2016-12-04: qty 60

## 2016-12-04 MED ORDER — FUROSEMIDE 10 MG/ML IJ SOLN
20.0000 mg | Freq: Once | INTRAMUSCULAR | Status: AC
  Administered 2016-12-05: 20 mg via INTRAVENOUS
  Filled 2016-12-04: qty 2

## 2016-12-04 MED ORDER — NITROGLYCERIN 0.4 MG SL SUBL: 0.4000 mg | SUBLINGUAL_TABLET | SUBLINGUAL | Status: DC | PRN

## 2016-12-04 NOTE — Telephone Encounter (Signed)
Spoke to son of patient. He notes pt was seen for GI bleed evaluation by her gastroenterologist in [place], states she needs procedure including transfusion and upper endoscopy. D/t high risk including cardiac diagnoses and limitation of services through [facility], she is being sent to [facility] for eval - son explains she will have to go through ED for admission.  Son wants to see if Dr. Percival Spanish had any kind of "pull" to make sure patient is not just transfused and sent home.  Pt currently en route to [facility] hospital via private vehicle. Informed son I would relay msg so Dr. Percival Spanish is aware of situation, but pt will need eval by ED MD to determine criteria for admission. He verbalized understanding.

## 2016-12-04 NOTE — ED Provider Notes (Signed)
[facility] Provider Note   CSN: [id number] Arrival date & time: 12/04/16  1617     History   Chief Complaint Chief Complaint  Patient presents with  . Abnormal Lab    HPI Kelly Ruiz is a 83 y.o. female.  HPI Pt presents to the ED for evaluation of low blood count.  She saw her GI doctor today.  She was seen in the office and noted to have a low hemoglobin.  Her stool was occult positive however her GI doctor is not sure that GI bleeding is the cause.  He was concerned about possible bone marrow dysfunction.  Patient was sent to the hospital to be admitted for further workup and blood transfusions.  Patient son is also noticed that she has had some increasing peripheral edema.  Patient has had weight gain.  Her renal doctor had decreased her diuretics.  Patient denies any trouble with chest pain or shortness of breath.  No abdominal pain.  She has noticed dark stools but she takes iron so it is no different than usual. Past Medical History:  Diagnosis Date  . CHF (congestive heart failure) (HCC)    Diastolic. Precipitated by Actos  . Chronic kidney disease    mild-moderate. GFR was 35 in 02/12  . Coronary artery disease 02/2010   abnormal nuclear stress test with mild ischemia in apical anterolateral wall.   . DM (diabetes mellitus) ([place])   . Dyslipidemia   . Hypertension   . Nasal polyps   . Unspecified hypertensive heart disease without heart failure     Patient Active Problem List   Diagnosis Date Noted  . Anemia 12/04/2016  . Hyperkalemia 12/04/2016  . Degenerative disc disease, lumbar 11/05/2016  . Dyspnea, unspecified 04/10/2016  . Chronic renal insufficiency, stage IV (severe) ([place]) 11/08/2015  . Chest pain with moderate risk of acute coronary syndrome 11/08/2015  . 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 Chronic diastolic congestive heart failure ([place])   . Essential hypertension      Past Surgical History:  Procedure Laterality Date  . APPENDECTOMY  1951  . CATARACT EXTRACTION, BILATERAL    . CHOLECYSTECTOMY    . GLAUCOMA SURGERY  10/29/2013  . hysterectomy - unknown type    . NECK MASS EXCISION     non cancerous  . TONSILLECTOMY      OB History    No data available       Home Medications    Prior to Admission medications   Medication Sig Start Date End Date Taking? Authorizing Provider  acetaminophen (TYLENOL) 500 MG tablet Take 500 mg by mouth 2 (two) times daily.   Yes [provider]  atorvastatin (LIPITOR) 10 MG tablet Take 10 mg by mouth at bedtime.    Yes [provider]  brinzolamide (AZOPT) 1 % ophthalmic suspension Place 1 drop into the right eye 2 (two) times daily.    Yes [provider]  bumetanide (BUMEX) 1 MG tablet Take 0.5 tablets by mouth 2 (two) times daily. 06/13/16  Yes [provider]  Calcium Carbonate-Vitamin D (CALCIUM-VITAMIN D) 500-200 MG-UNIT per tablet Take 1 tablet by mouth 2 (two) times daily with a meal.   Yes [provider]  carvedilol (COREG) 12.5 MG tablet Take 1 tablet (12.5 mg total) by mouth 2 (two) times daily with a meal. 08/03/10  Yes Wellington Hampshire, MD  carvedilol (COREG) 6.25 MG tablet Take 1 tablet (6.25 mg  total) 2 (two) times daily by mouth. Take with 12.5 mg to make 18.75mg  11/26/16  Yes Kilroy, Doreene Burke, PA-C  cyanocobalamin 1000 MCG tablet Take 1,000 mcg by mouth daily.    Yes [provider]  dicyclomine (BENTYL) 10 MG capsule Take 10 mg by mouth 4 (four) times daily.    Yes [provider]  diphenoxylate-atropine (LOMOTIL) 2.5-0.025 MG tablet Take 1 tablet by mouth 3 (three) times daily. 09/06/16  Yes [provider]  ferrous sulfate 325 (65 FE) MG tablet Take 325 mg by mouth 2 (two) times daily with a meal.    Yes [provider]  fluticasone (FLONASE) 50 MCG/ACT nasal spray Place 2 sprays into both nostrils 2 (two) times  daily.   Yes [provider]  hydrALAZINE (APRESOLINE) 50 MG tablet Take 50 mg by mouth 2 (two) times daily.  11/17/13  Yes [provider]  Insulin Glargine (LANTUS SOLOSTAR) 100 UNIT/ML Solostar Pen Inject 2 Units into the skin daily as needed. B.S over 200 Sliding scale   Yes [provider]  isosorbide mononitrate (IMDUR) 120 MG 24 hr tablet Take 2 tablets (240 mg total) by mouth daily. 05/03/16  Yes Minus Breeding, MD  latanoprost (XALATAN) 0.005 % ophthalmic solution Place 1 drop into the right eye at bedtime.  01/20/13  Yes [provider]  Liniments (SALONPAS ARTHRITIS PAIN RELIEF EX) Apply 1 patch topically every 12 (twelve) hours.   Yes [provider]  losartan (COZAAR) 50 MG tablet Take 50 mg by mouth at bedtime.    Yes [provider]  montelukast (SINGULAIR) 10 MG tablet Take 10 mg by mouth at bedtime.   Yes [provider]  Multiple Vitamin (MULTIVITAMIN) tablet Take 1 tablet by mouth daily.   Yes [provider]  Multiple Vitamins-Minerals (EYE VITAMINS PO) Take by mouth 2 (two) times daily.   Yes [provider]  nitroGLYCERIN (NITROSTAT) 0.4 MG SL tablet Place 1 tablet (0.4 mg total) under the tongue every 5 (five) minutes as needed for chest pain. 03/22/15  Yes Minus Breeding, MD  Omega-3 Fatty Acids (FISH OIL) 1000 MG CPDR Take 1 capsule by mouth daily.   Yes [provider]  omeprazole (PRILOSEC) 20 MG capsule Take 20 mg by mouth daily.   Yes [provider]  prednisoLONE acetate (PRED FORTE) 1 % ophthalmic suspension Place 1 drop into the left eye daily. 09/03/16  Yes [provider]  timolol (TIMOPTIC) 0.5 % ophthalmic solution Place 1 drop into the right eye 2 (two) times daily.  01/14/12  Yes [provider]  traMADol (ULTRAM) 50 MG tablet Take 1 tablet (50 mg total) at bedtime as needed by mouth. 11/26/16  Yes Lyndal Pulley, DO  traZODone (DESYREL) 50 MG tablet  Take 75 mg by mouth at bedtime. 09/28/16  Yes [provider]  vitamin C (ASCORBIC ACID) 500 MG tablet Take 500 mg by mouth 2 (two) times daily.    Yes [provider]  gabapentin (NEURONTIN) 100 MG capsule Take 1 capsule (100 mg total) by mouth at bedtime. Patient not taking: Reported on 12/04/2016 11/05/16   Lyndal Pulley, DO    Family History Family History  Problem Relation Age of Onset  . Diabetes Unknown   . Hypertension Unknown   . Stroke Unknown   . Asthma Mother   . Allergic rhinitis Mother   . Congestive Heart Failure Mother   . Stroke Mother     Social History Social  History   Tobacco Use  . Smoking status: Never Smoker  . Smokeless tobacco: Never Used  Substance Use Topics  . Alcohol use: No    Alcohol/week: 0.0 oz  . Drug use: No     Allergies   Actos [pioglitazone hydrochloride]; Felodipine er; Hctz [hydrochlorothiazide]; Lisinopril; Penicillins; Plendil [felodipine]; Amlodipine besylate; Brimonidine tartrate; Clonidine derivatives; Combigan [brimonidine tartrate-timolol]; Dorzolamide; Monopril [fosinopril sodium]; and Norvasc [amlodipine besylate]   Review of Systems Review of Systems  All other systems reviewed and are negative.    Physical Exam Updated Vital Signs BP (!) 154/50 (BP Location: Right Arm)   Pulse 77   Temp 98 F (36.7 C) (Oral)   Resp 16   Wt 66.7 kg (147 lb)   SpO2 100%   BMI 28.71 kg/m   Physical Exam  Constitutional: No distress.  HENT:  Head: Normocephalic and atraumatic.  Right Ear: External ear normal.  Left Ear: External ear normal.  Eyes: Conjunctivae are normal. Right eye exhibits no discharge. Left eye exhibits no discharge. No scleral icterus.  Neck: Neck supple. No tracheal deviation present.  Cardiovascular: Normal rate, regular rhythm and intact distal pulses.  Pulmonary/Chest: Effort normal and breath sounds normal. No stridor. No respiratory distress. She has no wheezes. She has no rales.   Abdominal: Soft. Bowel sounds are normal. She exhibits no distension. There is no tenderness. There is no rebound and no guarding.  Musculoskeletal: She exhibits edema ( Pitting edema bilateral lower extremities). She exhibits no tenderness.  Neurological: She is alert. She has normal strength. No cranial nerve deficit (no facial droop, extraocular movements intact, no slurred speech) or sensory deficit. She exhibits normal muscle tone. She displays no seizure activity. Coordination normal.  Skin: Skin is warm and dry. No rash noted. She is not diaphoretic. There is pallor.  Psychiatric: She has a normal mood and affect.  Nursing note and vitals reviewed.    ED Treatments / Results  Labs (all labs ordered are listed, but only abnormal results are displayed) Labs Reviewed  COMPREHENSIVE METABOLIC PANEL - Abnormal; Notable for the following components:      Result Value   Potassium 5.4 (*)    CO2 18 (*)    Glucose, Bld 128 (*)    BUN 70 (*)    Creatinine, Ser 1.89 (*)    Calcium 8.8 (*)    Total Protein 4.7 (*)    Albumin 2.5 (*)    GFR calc non Af Amer 22 (*)    GFR calc Af Amer 26 (*)    All other components within normal limits  CBC - Abnormal; Notable for the following components:   RBC 2.37 (*)    Hemoglobin 7.6 (*)    HCT 24.4 (*)    MCV 103.0 (*)    RDW 16.0 (*)    All other components within normal limits  RETICULOCYTES - Abnormal; Notable for the following components:   Retic Ct Pct 5.2 (*)    RBC. 2.35 (*)    All other components within normal limits  APTT  PROTIME-INR  VITAMIN B12  FOLATE  IRON AND TIBC  FERRITIN  COMPREHENSIVE METABOLIC PANEL  CBC  POC OCCULT BLOOD, ED  TYPE AND SCREEN  ABO/RH  PREPARE RBC (CROSSMATCH)    Radiology No results found.  Procedures Procedures (including critical care time)  Medications Ordered in ED Medications  acetaminophen (TYLENOL) tablet 650 mg (not administered)    Or  acetaminophen (TYLENOL) suppository 650  mg (not administered)  sodium chloride flush (NS) 0.9 % injection 3 mL (3 mLs Intravenous Given 12/04/16 2325)  sodium chloride flush (NS) 0.9 % injection 3 mL (not administered)  0.9 %  sodium chloride infusion (not administered)  atorvastatin (LIPITOR) tablet 10 mg (10 mg Oral Given 12/04/16 2320)  brinzolamide (AZOPT) 1 % ophthalmic suspension 1 drop (1 drop Right Eye Given 12/04/16 2322)  bumetanide (BUMEX) tablet 0.5 mg (not administered)  carvedilol (COREG) tablet 12.5 mg (not administered)  vitamin B-12 (CYANOCOBALAMIN) tablet 1,000 mcg (not administered)  dicyclomine (BENTYL) capsule 10 mg (10 mg Oral Given 12/04/16 2320)  diphenoxylate-atropine (LOMOTIL) 2.5-0.025 MG per tablet 1 tablet (1 tablet Oral Given 12/04/16 2319)  ferrous sulfate tablet 325 mg (not administered)  fluticasone (FLONASE) 50 MCG/ACT nasal spray 2 spray (not administered)  hydrALAZINE (APRESOLINE) tablet 50 mg (50 mg Oral Given 12/04/16 2320)  isosorbide mononitrate (IMDUR) 24 hr tablet 240 mg (not administered)  latanoprost (XALATAN) 0.005 % ophthalmic solution 1 drop (1 drop Right Eye Given 12/04/16 2323)  losartan (COZAAR) tablet 50 mg (50 mg Oral Given 12/04/16 2319)  montelukast (SINGULAIR) tablet 10 mg (10 mg Oral Given 12/04/16 2320)  multivitamin with minerals tablet 1 tablet (not administered)  nitroGLYCERIN (NITROSTAT) SL tablet 0.4 mg (not administered)  pantoprazole (PROTONIX) EC tablet 40 mg (not administered)  prednisoLONE acetate (PRED FORTE) 1 % ophthalmic suspension 1 drop (not administered)  timolol (TIMOPTIC) 0.5 % ophthalmic solution 1 drop (1 drop Right Eye Given 12/04/16 2323)  traMADol (ULTRAM) tablet 50 mg (not administered)  traZODone (DESYREL) tablet 75 mg (75 mg Oral Given 12/04/16 2321)  vitamin C (ASCORBIC ACID) tablet 500 mg (500 mg Oral Given 12/04/16 2319)  furosemide (LASIX) injection 20 mg (not administered)  furosemide (LASIX) injection 20 mg (20 mg Intravenous Given 12/04/16  2321)  calcium gluconate 1 g in sodium chloride 0.9 % 100 mL IVPB (not administered)  sodium polystyrene (KAYEXALATE) 15 GM/60ML suspension 15 g (not administered)  0.9 %  sodium chloride infusion (10 mL/hr Intravenous New Bag/Given 12/04/16 1950)  sodium bicarbonate injection 50 mEq (50 mEq Intravenous Given 12/04/16 2334)     Initial Impression / Assessment and Plan / ED Course  I have reviewed the triage vital signs and the nursing notes.  Pertinent labs & imaging results that were available during my care of the patient were reviewed by me and considered in my medical decision making (see chart for details).   She presented to the emergency room for further evaluation of anemia.  Transfusions ordered.  Patient admitted to the hospital for further treatment and evaluation.  Final Clinical Impressions(s) / ED Diagnoses   Final diagnoses:  Symptomatic anemia      Dorie Rank, MD 12/04/16 2344

## 2016-12-04 NOTE — ED Notes (Signed)
ED Provider at bedside. 

## 2016-12-04 NOTE — Telephone Encounter (Signed)
°  New Prob  Pt was admitted to hospital today. Requesting to speak to nurse regarding her plan of care. Please call.

## 2016-12-04 NOTE — ED Triage Notes (Signed)
Pt sent by doctor for hgb 7.5. pts son denies pt having any bloody stools but states she had a recent positive occult stool when she was in the hospital. resp e/u, skin pale, nad.

## 2016-12-04 NOTE — H&P (Addendum)
TRH H&P   Patient Demographics:    Kelly Ruiz, is a 83 y.o. female  MRN: [id number]   DOB - 31-Mar-1923  Admit Date - 12/04/2016  Outpatient Primary MD for the patient is Ronita Hipps, MD  Referring MD/NP/PA: Marye Round  Outpatient Specialists:  Dr. Melina Copa (GI) Dr. Audie Clear (nephrologist)  Patient coming from: home  Chief Complaint  Patient presents with  . Abnormal Lab      HPI:    Kelly Ruiz  is a 82 y.o. female, w dm2, hypertension ckd stage 4, CHF (diastolic), apparently went to be evaluated by GI in [place], [place] and was noted to be anemic and had rectal exam with purportedly brown stool but FOBT +.  Pt was sent by GI for evaluation of worsening anemia.   In ED,  Na 136, K 5.4 Glucose 128 Bun 70, Creatinine 1.89 Alb 2.5 Wbc 7.6, Hgb 7.6, Plt 150 Mcv 103   Pt will be admitted for Anemia, hyperkalemia.       Review of systems:    In addition to the HPI above,        No Fever-chills, No Headache, No changes with Vision or hearing, No problems swallowing food or Liquids, No Chest pain, Cough or Shortness of Breath, No Abdominal pain, No Nausea or Vommitting, Bowel movements are regular, No Blood in stool or Urine, No dysuria, No new skin rashes or bruises, No new joints pains-aches,  No new weakness, tingling, numbness in any extremity, No recent weight gain or loss, No polyuria, polydypsia or polyphagia, No significant Mental Stressors.  A full 10 point Review of Systems was done, except as stated above, all other Review of Systems were negative.   With Past History of the following :    Past Medical History:  Diagnosis Date  . CHF (congestive heart failure) (HCC)    Diastolic. Precipitated by Actos  . Chronic kidney disease    mild-moderate. GFR was 35 in 02/12  . Coronary artery disease 02/2010   abnormal nuclear stress test with  mild ischemia in apical anterolateral wall.   . DM (diabetes mellitus) ([place])   . Dyslipidemia   . Hypertension   . Nasal polyps   . Unspecified hypertensive heart disease without heart failure       Past Surgical History:  Procedure Laterality Date  . APPENDECTOMY  1951  . CATARACT EXTRACTION, BILATERAL    . CHOLECYSTECTOMY    . GLAUCOMA SURGERY  10/29/2013  . hysterectomy - unknown type    . NECK MASS EXCISION     non cancerous  . TONSILLECTOMY        Social History:     Social History   Tobacco Use  . Smoking status: Never Smoker  . Smokeless tobacco: Never Used  Substance Use Topics  . Alcohol use: No    Alcohol/week: 0.0 oz  Lives - at home  Mobility - walks by self   Family History :     Family History  Problem Relation Age of Onset  . Diabetes Unknown   . Hypertension Unknown   . Stroke Unknown   . Asthma Mother   . Allergic rhinitis Mother   . Congestive Heart Failure Mother   . Stroke Mother       Home Medications:   Prior to Admission medications   Medication Sig Start Date End Date Taking? Authorizing Provider  acetaminophen (TYLENOL) 500 MG tablet Take 500 mg by mouth 2 (two) times daily.   Yes [provider]  atorvastatin (LIPITOR) 10 MG tablet Take 10 mg by mouth at bedtime.    Yes [provider]  brinzolamide (AZOPT) 1 % ophthalmic suspension Place 1 drop into the right eye 2 (two) times daily.    Yes [provider]  bumetanide (BUMEX) 1 MG tablet Take 0.5 tablets by mouth 2 (two) times daily. 06/13/16  Yes [provider]  Calcium Carbonate-Vitamin D (CALCIUM-VITAMIN D) 500-200 MG-UNIT per tablet Take 1 tablet by mouth 2 (two) times daily with a meal.   Yes [provider]  carvedilol (COREG) 12.5 MG tablet Take 1 tablet (12.5 mg total) by mouth 2 (two) times daily with a meal. 08/03/10  Yes Wellington Hampshire, MD  carvedilol (COREG) 6.25 MG tablet Take 1 tablet (6.25 mg total) 2 (two) times  daily by mouth. Take with 12.5 mg to make 18.75mg  11/26/16  Yes Kilroy, Doreene Burke, PA-C  cyanocobalamin 1000 MCG tablet Take 1,000 mcg by mouth daily.    Yes [provider]  dicyclomine (BENTYL) 10 MG capsule Take 10 mg by mouth 4 (four) times daily.    Yes [provider]  ferrous sulfate 325 (65 FE) MG tablet Take 325 mg by mouth 2 (two) times daily with a meal.    Yes [provider]  fluticasone (FLONASE) 50 MCG/ACT nasal spray Place 2 sprays into both nostrils 2 (two) times daily.   Yes [provider]  hydrALAZINE (APRESOLINE) 50 MG tablet Take 50 mg by mouth 2 (two) times daily.  11/17/13  Yes [provider]  isosorbide mononitrate (IMDUR) 120 MG 24 hr tablet Take 2 tablets (240 mg total) by mouth daily. 05/03/16  Yes Minus Breeding, MD  latanoprost (XALATAN) 0.005 % ophthalmic solution Place 1 drop into the right eye at bedtime.  01/20/13  Yes [provider]  losartan (COZAAR) 50 MG tablet Take 50 mg by mouth at bedtime.    Yes [provider]  montelukast (SINGULAIR) 10 MG tablet Take 10 mg by mouth at bedtime.   Yes [provider]  Multiple Vitamin (MULTIVITAMIN) tablet Take 1 tablet by mouth daily.   Yes [provider]  Multiple Vitamins-Minerals (EYE VITAMINS PO) Take by mouth 2 (two) times daily.   Yes [provider]  nitroGLYCERIN (NITROSTAT) 0.4 MG SL tablet Place 1 tablet (0.4 mg total) under the tongue every 5 (five) minutes as needed for chest pain. 03/22/15  Yes Minus Breeding, MD  Omega-3 Fatty Acids (FISH OIL) 1000 MG CPDR Take 1 capsule by mouth daily.   Yes [provider]  omeprazole (PRILOSEC) 20 MG capsule Take 20 mg by mouth daily.   Yes [provider]  timolol (TIMOPTIC) 0.5 % ophthalmic solution Place 1 drop into the right eye 2 (two) times daily.  01/14/12  Yes [provider]  traMADol (ULTRAM) 50 MG tablet  Take 1 tablet (50 mg total) at bedtime as needed  by mouth. 11/26/16  Yes Lyndal Pulley, DO  vitamin C (ASCORBIC ACID) 500 MG tablet Take 500 mg by mouth 2 (two) times daily.    Yes [provider]  gabapentin (NEURONTIN) 100 MG capsule Take 1 capsule (100 mg total) by mouth at bedtime. Patient not taking: Reported on 12/04/2016 11/05/16   Lyndal Pulley, DO     Allergies:     Allergies  Allergen Reactions  . Actos [Pioglitazone Hydrochloride]   . Felodipine Er   . Hctz [Hydrochlorothiazide]   . Lisinopril   . Penicillins   . Plendil [Felodipine]   . Amlodipine Besylate Other (See Comments) and Hives  . Brimonidine Tartrate Other (See Comments)    Eye redness and itchiness, blurred vision  . Clonidine Derivatives Rash  . Combigan [Brimonidine Tartrate-Timolol] Other (See Comments)    Eye redness and itchiness, blurred vision  . Dorzolamide Other (See Comments)    Increased eye pressure, blurred vision  . Monopril [Fosinopril Sodium] Cough  . Norvasc [Amlodipine Besylate] Hives     Physical Exam:   Vitals  Blood pressure 120/60, pulse 65, temperature 97.6 F (36.4 C), temperature source Oral, resp. rate 15, SpO2 99 %.   1. General lying in bed in NAD,    2. Normal affect and insight, Not Suicidal or Homicidal, Awake Alert, Oriented X 3.  3. No F.N deficits, ALL C.Nerves Intact, Strength 5/5 all 4 extremities, Sensation intact all 4 extremities, Plantars down going.  4. Ears and Eyes appear Normal, Pale conjunctiva, PERRLA. Moist Oral Mucosa.  5. Supple Neck, No JVD, No cervical lymphadenopathy appriciated, No Carotid Bruits.  6. Symmetrical Chest wall movement, Good air movement bilaterally, CTAB.  7. RRR, No Gallops, Rubs or Murmurs, No Parasternal Heave.  8. Positive Bowel Sounds, Abdomen Soft, No tenderness, No organomegaly appriciated,No rebound -guarding or rigidity.  9.  No Cyanosis,  1+ edema  10. Good muscle tone,  joints appear normal , no effusions, Normal ROM.  11. No Palpable Lymph  Nodes in Neck or Axillae     Data Review:    CBC Recent Labs  Lab 12/04/16 1730  WBC 7.6  HGB 7.6*  HCT 24.4*  PLT 150  MCV 103.0*  MCH 32.1  MCHC 31.1  RDW 16.0*   ------------------------------------------------------------------------------------------------------------------  Chemistries  Recent Labs  Lab 12/04/16 1730  NA 136  K 5.4*  CL 111  CO2 18*  GLUCOSE 128*  BUN 70*  CREATININE 1.89*  CALCIUM 8.8*  AST 18  ALT 29  ALKPHOS 82  BILITOT 0.3   ------------------------------------------------------------------------------------------------------------------ estimated creatinine clearance is 16.5 mL/min (A) (by C-G formula based on SCr of 1.89 mg/dL (H)). ------------------------------------------------------------------------------------------------------------------ No results for input(s): TSH, T4TOTAL, T3FREE, THYROIDAB in the last 72 hours.  Invalid input(s): FREET3  Coagulation profile No results for input(s): INR, PROTIME in the last 168 hours. ------------------------------------------------------------------------------------------------------------------- No results for input(s): DDIMER in the last 72 hours. -------------------------------------------------------------------------------------------------------------------  Cardiac Enzymes No results for input(s): CKMB, TROPONINI, MYOGLOBIN in the last 168 hours.  Invalid input(s): CK ------------------------------------------------------------------------------------------------------------------ No results found for: BNP   ---------------------------------------------------------------------------------------------------------------  Urinalysis No results found for: COLORURINE, APPEARANCEUR, LABSPEC, [place], GLUCOSEU, HGBUR, BILIRUBINUR, KETONESUR, PROTEINUR, UROBILINOGEN, NITRITE,  LEUKOCYTESUR  ----------------------------------------------------------------------------------------------------------------   Imaging Results:    No results found.    Assessment & Plan:    Active Problems:   Anemia  Anemia Check ferritin, iron, tibc, b12, folate, tsh, Consider spep, upep Type and  screen Transfuse 1 units prbc due to symptomatic anemia.  Transfuse  unit over 4 hours, Lasix 20mg  iv x1 Note FOBT positive, outpatient GI thought this was a bone marrow issue however NPO after 3am Consider GI consultation in am vs hematology consult in am depending upon labs  ? b12 def Continue vitamin b12  ? Iron def Continue ferrous sulfate  Gerd Cont PPI  Hyperkalemia Calcium gluconate Sodium bicarbonate Kayexalate 15gm iv x1 STOP potassium bicarbonate for now Check cmp in am Consider STOP Losartan if persistently elevated  CKD stage 4 Check cmp in am STOP Potassium bicarbonate for now  Dm2 fsbs q4h , ISS  Severe protein calorie malnutrition prostat 52mL po bid  CHF/ Edema Cont carvedilol Cont hydralazine Cont losartan (if potassium is persistently elevated my need to STOP) Lasix 20mg  iv qday  Glaucoma Continue eye drops          DVT Prophylaxis  SCDs  AM Labs Ordered, also please review Full Orders  Family Communication: Admission, patients condition and plan of care including tests being ordered have been discussed with the patient  who indicate understanding and agree with the plan and Code Status.  Code Status FULL CODE  Likely DC to  home  Condition GUARDED    Consults called: none  Admission status: observation   Time spent in minutes : 45    Jani Gravel M.D on 12/04/2016 at 7:16 PM  Between 7am to 7pm - Pager - [phone]  . After 7pm go to [email or web address] - password [facility]  [facility] - Office  [phone]

## 2016-12-05 ENCOUNTER — Other Ambulatory Visit: Payer: Self-pay

## 2016-12-05 DIAGNOSIS — E785 Hyperlipidemia, unspecified: Secondary | ICD-10-CM | POA: Diagnosis present

## 2016-12-05 DIAGNOSIS — E875 Hyperkalemia: Secondary | ICD-10-CM | POA: Diagnosis not present

## 2016-12-05 DIAGNOSIS — Z7951 Long term (current) use of inhaled steroids: Secondary | ICD-10-CM | POA: Diagnosis not present

## 2016-12-05 DIAGNOSIS — I509 Heart failure, unspecified: Secondary | ICD-10-CM

## 2016-12-05 DIAGNOSIS — Z880 Allergy status to penicillin: Secondary | ICD-10-CM | POA: Diagnosis not present

## 2016-12-05 DIAGNOSIS — Z66 Do not resuscitate: Secondary | ICD-10-CM | POA: Diagnosis present

## 2016-12-05 DIAGNOSIS — Z794 Long term (current) use of insulin: Secondary | ICD-10-CM | POA: Diagnosis not present

## 2016-12-05 DIAGNOSIS — N184 Chronic kidney disease, stage 4 (severe): Secondary | ICD-10-CM

## 2016-12-05 DIAGNOSIS — I5033 Acute on chronic diastolic (congestive) heart failure: Secondary | ICD-10-CM | POA: Diagnosis not present

## 2016-12-05 DIAGNOSIS — D509 Iron deficiency anemia, unspecified: Secondary | ICD-10-CM | POA: Diagnosis present

## 2016-12-05 DIAGNOSIS — R531 Weakness: Secondary | ICD-10-CM | POA: Diagnosis not present

## 2016-12-05 DIAGNOSIS — Z9841 Cataract extraction status, right eye: Secondary | ICD-10-CM | POA: Diagnosis not present

## 2016-12-05 DIAGNOSIS — E1129 Type 2 diabetes mellitus with other diabetic kidney complication: Secondary | ICD-10-CM

## 2016-12-05 DIAGNOSIS — Z8249 Family history of ischemic heart disease and other diseases of the circulatory system: Secondary | ICD-10-CM | POA: Diagnosis not present

## 2016-12-05 DIAGNOSIS — H409 Unspecified glaucoma: Secondary | ICD-10-CM | POA: Diagnosis present

## 2016-12-05 DIAGNOSIS — D649 Anemia, unspecified: Secondary | ICD-10-CM

## 2016-12-05 DIAGNOSIS — K219 Gastro-esophageal reflux disease without esophagitis: Secondary | ICD-10-CM | POA: Diagnosis present

## 2016-12-05 DIAGNOSIS — Z90710 Acquired absence of both cervix and uterus: Secondary | ICD-10-CM | POA: Diagnosis not present

## 2016-12-05 DIAGNOSIS — Z823 Family history of stroke: Secondary | ICD-10-CM | POA: Diagnosis not present

## 2016-12-05 DIAGNOSIS — Z825 Family history of asthma and other chronic lower respiratory diseases: Secondary | ICD-10-CM | POA: Diagnosis not present

## 2016-12-05 DIAGNOSIS — Z9842 Cataract extraction status, left eye: Secondary | ICD-10-CM | POA: Diagnosis not present

## 2016-12-05 DIAGNOSIS — Z6828 Body mass index (BMI) 28.0-28.9, adult: Secondary | ICD-10-CM | POA: Diagnosis not present

## 2016-12-05 DIAGNOSIS — E1122 Type 2 diabetes mellitus with diabetic chronic kidney disease: Secondary | ICD-10-CM | POA: Diagnosis present

## 2016-12-05 DIAGNOSIS — Z888 Allergy status to other drugs, medicaments and biological substances status: Secondary | ICD-10-CM | POA: Diagnosis not present

## 2016-12-05 DIAGNOSIS — Z9049 Acquired absence of other specified parts of digestive tract: Secondary | ICD-10-CM | POA: Diagnosis not present

## 2016-12-05 DIAGNOSIS — D500 Iron deficiency anemia secondary to blood loss (chronic): Secondary | ICD-10-CM | POA: Diagnosis not present

## 2016-12-05 DIAGNOSIS — I2510 Atherosclerotic heart disease of native coronary artery without angina pectoris: Secondary | ICD-10-CM | POA: Diagnosis present

## 2016-12-05 DIAGNOSIS — E43 Unspecified severe protein-calorie malnutrition: Secondary | ICD-10-CM | POA: Diagnosis present

## 2016-12-05 DIAGNOSIS — I130 Hypertensive heart and chronic kidney disease with heart failure and stage 1 through stage 4 chronic kidney disease, or unspecified chronic kidney disease: Secondary | ICD-10-CM | POA: Diagnosis present

## 2016-12-05 DIAGNOSIS — E119 Type 2 diabetes mellitus without complications: Secondary | ICD-10-CM | POA: Diagnosis not present

## 2016-12-05 LAB — COMPREHENSIVE METABOLIC PANEL
ALT: 26 U/L (ref 14–54)
AST: 17 U/L (ref 15–41)
Albumin: 2.3 g/dL — ABNORMAL LOW (ref 3.5–5.0)
Alkaline Phosphatase: 68 U/L (ref 38–126)
Anion gap: 6 (ref 5–15)
BUN: 68 mg/dL — ABNORMAL HIGH (ref 6–20)
CHLORIDE: 112 mmol/L — AB (ref 101–111)
CO2: 19 mmol/L — ABNORMAL LOW (ref 22–32)
Calcium: 8.6 mg/dL — ABNORMAL LOW (ref 8.9–10.3)
Creatinine, Ser: 1.8 mg/dL — ABNORMAL HIGH (ref 0.44–1.00)
GFR, EST AFRICAN AMERICAN: 27 mL/min — AB (ref >60)
GFR, EST NON AFRICAN AMERICAN: 23 mL/min — AB (ref >60)
Glucose, Bld: 107 mg/dL — ABNORMAL HIGH (ref 65–99)
POTASSIUM: 4.9 mmol/L (ref 3.5–5.1)
SODIUM: 137 mmol/L (ref 135–145)
Total Bilirubin: 0.6 mg/dL (ref 0.3–1.2)
Total Protein: 4.3 g/dL — ABNORMAL LOW (ref 6.5–8.1)

## 2016-12-05 LAB — FOLATE: FOLATE: 60.9 ng/mL (ref >5.9)

## 2016-12-05 LAB — CBC
HEMATOCRIT: 24.8 % — AB (ref 36.0–46.0)
HEMOGLOBIN: 7.9 g/dL — AB (ref 12.0–15.0)
MCH: 31.3 pg (ref 26.0–34.0)
MCHC: 31.9 g/dL (ref 30.0–36.0)
MCV: 98.4 fL (ref 78.0–100.0)
Platelets: 131 10*3/uL — ABNORMAL LOW (ref 150–400)
RBC: 2.52 MIL/uL — ABNORMAL LOW (ref 3.87–5.11)
RDW: 18.2 % — AB (ref 11.5–15.5)
WBC: 6.3 10*3/uL (ref 4.0–10.5)

## 2016-12-05 LAB — LACTATE DEHYDROGENASE: LDH: 144 U/L (ref 98–192)

## 2016-12-05 LAB — HEMOGLOBIN AND HEMATOCRIT, BLOOD
HEMATOCRIT: 28.3 % — AB (ref 36.0–46.0)
HEMOGLOBIN: 9.1 g/dL — AB (ref 12.0–15.0)

## 2016-12-05 LAB — IRON AND TIBC
IRON: 41 ug/dL (ref 28–170)
Saturation Ratios: 15 % (ref 10.4–31.8)
TIBC: 279 ug/dL (ref 250–450)
UIBC: 238 ug/dL

## 2016-12-05 LAB — FERRITIN: FERRITIN: 63 ng/mL (ref 11–307)

## 2016-12-05 LAB — DIRECT ANTIGLOBULIN TEST (NOT AT ARMC)
DAT, IGG: NEGATIVE
DAT, complement: NEGATIVE

## 2016-12-05 LAB — TSH: TSH: 1.1 u[IU]/mL (ref 0.350–4.500)

## 2016-12-05 LAB — VITAMIN B12: Vitamin B-12: 2990 pg/mL — ABNORMAL HIGH (ref 180–914)

## 2016-12-05 MED ORDER — CARVEDILOL 6.25 MG PO TABS
18.7500 mg | ORAL_TABLET | Freq: Two times a day (BID) | ORAL | Status: DC
  Administered 2016-12-05 – 2016-12-06 (×4): 18.75 mg via ORAL
  Filled 2016-12-05 (×5): qty 1

## 2016-12-05 MED ORDER — ASPIRIN EC 81 MG PO TBEC
81.0000 mg | DELAYED_RELEASE_TABLET | Freq: Every day | ORAL | Status: DC
  Administered 2016-12-06 – 2016-12-07 (×2): 81 mg via ORAL
  Filled 2016-12-05 (×2): qty 1

## 2016-12-05 MED ORDER — FUROSEMIDE 10 MG/ML IJ SOLN
40.0000 mg | Freq: Two times a day (BID) | INTRAMUSCULAR | Status: DC
  Administered 2016-12-05 – 2016-12-06 (×2): 40 mg via INTRAVENOUS
  Filled 2016-12-05 (×2): qty 4

## 2016-12-05 MED ORDER — FERROUS SULFATE 325 (65 FE) MG PO TABS
325.0000 mg | ORAL_TABLET | Freq: Two times a day (BID) | ORAL | Status: DC
  Administered 2016-12-05 – 2016-12-07 (×5): 325 mg via ORAL
  Filled 2016-12-05 (×6): qty 1

## 2016-12-05 MED ORDER — ISOSORBIDE MONONITRATE ER 60 MG PO TB24
240.0000 mg | ORAL_TABLET | Freq: Every day | ORAL | Status: DC
  Administered 2016-12-06: 240 mg via ORAL
  Filled 2016-12-05 (×2): qty 4

## 2016-12-05 MED ORDER — POTASSIUM CHLORIDE CRYS ER 20 MEQ PO TBCR: 40.0000 meq | EXTENDED_RELEASE_TABLET | Freq: Every day | ORAL | Status: DC

## 2016-12-05 MED ORDER — CARVEDILOL 12.5 MG PO TABS
12.5000 mg | ORAL_TABLET | Freq: Two times a day (BID) | ORAL | Status: DC
  Filled 2016-12-05: qty 1

## 2016-12-05 NOTE — Progress Notes (Signed)
Report given to Tiffany RN 

## 2016-12-05 NOTE — Consult Note (Signed)
New Hematology/Oncology Consult   Referral MD: Edwin Dada, MD   Reason for Referral: Symptomatic anemia  HPI: Kelly Ruiz is a 83 y.o. currently admitted to the hospital with symptomatic severe anemia. Her past medical history is significant for chronic kidney disease, stage IV, congestive heart failure, coronary artery disease, diabetes mellitus, hypertension. Patient presented to the emergency room yesterday with progressive weakness, fatigue, and dyspnea. On admission, patient found to have anemia with hemoglobin 7.6, also hyperkalemia with potassium of 5.4. Patient reports that symptoms of fatigue and weakness have been progressive over the past several months. Previously, patient was quite satisfied with her quality of life and energy levels completely that has changed significantly. She reports chronic diarrhea with dark stools. She has been receiving chronic iron supplementation for unknown period of time. Denies epistaxis, hemoptysis, hematemesis. No hematochezia. Thus a history of diverticulosis with diverticulitis in the past that was treated with antibiotics.  Patient denies any fever, chills, night sweats. Denies jaundice or yellowing of the eyes. Denies abdominal pain or bloating. Change in urine color and no dysuria.    Past Medical History:  Diagnosis Date  . CHF (congestive heart failure) (HCC)    Diastolic. Precipitated by Actos  . Chronic kidney disease    mild-moderate. GFR was 35 in 02/12  . Coronary artery disease 02/2010   abnormal nuclear stress test with mild ischemia in apical anterolateral wall.   . DM (diabetes mellitus) ([place])   . Dyslipidemia   . Hypertension   . Nasal polyps   . Unspecified hypertensive heart disease without heart failure   :  Past Surgical History:  Procedure Laterality Date  . APPENDECTOMY  1951  . CATARACT EXTRACTION, BILATERAL    . CHOLECYSTECTOMY    . GLAUCOMA SURGERY  10/29/2013  . hysterectomy - unknown type    .  NECK MASS EXCISION     non cancerous  . TONSILLECTOMY    :   Current Facility-Administered Medications:  .  0.9 %  sodium chloride infusion, 250 mL, Intravenous, PRN, Jani Gravel, MD .  acetaminophen (TYLENOL) tablet 650 mg, 650 mg, Oral, Q6H PRN **OR** acetaminophen (TYLENOL) suppository 650 mg, 650 mg, Rectal, Q6H PRN, Jani Gravel, MD .  Derrill Memo ON 12/06/2016] aspirin EC tablet 81 mg, 81 mg, Oral, Daily, Danford, Christopher P, MD .  atorvastatin (LIPITOR) tablet 10 mg, 10 mg, Oral, Loma Sousa, MD, 10 mg at 12/04/16 2320 .  brinzolamide (AZOPT) 1 % ophthalmic suspension 1 drop, 1 drop, Right Eye, BID, Jani Gravel, MD, 1 drop at 12/05/16 1027 .  carvedilol (COREG) tablet 18.75 mg, 18.75 mg, Oral, BID WC, Danford, Suann Larry, MD, 18.75 mg at 12/05/16 1053 .  dicyclomine (BENTYL) capsule 10 mg, 10 mg, Oral, QID, Jani Gravel, MD, 10 mg at 12/05/16 1828 .  diphenoxylate-atropine (LOMOTIL) 2.5-0.025 MG per tablet 1 tablet, 1 tablet, Oral, TID, Jani Gravel, MD, 1 tablet at 12/05/16 1455 .  ferrous sulfate tablet 325 mg, 325 mg, Oral, BID WC, Jani Gravel, MD, 325 mg at 12/05/16 1641 .  fluticasone (FLONASE) 50 MCG/ACT nasal spray 2 spray, 2 spray, Each Nare, BID, Jani Gravel, MD, 2 spray at 12/05/16 1028 .  furosemide (LASIX) injection 40 mg, 40 mg, Intravenous, BID, Danford, Suann Larry, MD, 40 mg at 12/05/16 1456 .  hydrALAZINE (APRESOLINE) tablet 50 mg, 50 mg, Oral, BID, Jani Gravel, MD, 50 mg at 12/05/16 1032 .  [START ON 12/06/2016] isosorbide mononitrate (IMDUR) 24 hr tablet 240 mg, 240 mg, Oral,  Daily, Danford, Suann Larry, MD .  latanoprost (XALATAN) 0.005 % ophthalmic solution 1 drop, 1 drop, Right Eye, Loma Sousa, MD, 1 drop at 12/04/16 2323 .  montelukast (SINGULAIR) tablet 10 mg, 10 mg, Oral, QHS, Jani Gravel, MD, 10 mg at 12/04/16 2320 .  multivitamin with minerals tablet 1 tablet, 1 tablet, Oral, Daily, Jani Gravel, MD, 1 tablet at 12/05/16 1328 .  nitroGLYCERIN (NITROSTAT) SL  tablet 0.4 mg, 0.4 mg, Sublingual, Q5 min PRN, Jani Gravel, MD .  pantoprazole (PROTONIX) EC tablet 40 mg, 40 mg, Oral, Daily, Jani Gravel, MD, 40 mg at 12/05/16 1034 .  prednisoLONE acetate (PRED FORTE) 1 % ophthalmic suspension 1 drop, 1 drop, Left Eye, Daily, Jani Gravel, MD, 1 drop at 12/05/16 1038 .  sodium chloride flush (NS) 0.9 % injection 3 mL, 3 mL, Intravenous, Q12H, Jani Gravel, MD, 3 mL at 12/05/16 1157 .  sodium chloride flush (NS) 0.9 % injection 3 mL, 3 mL, Intravenous, PRN, Jani Gravel, MD .  timolol (TIMOPTIC) 0.5 % ophthalmic solution 1 drop, 1 drop, Right Eye, BID, Jani Gravel, MD, 1 drop at 12/05/16 1041 .  traMADol (ULTRAM) tablet 50 mg, 50 mg, Oral, QHS PRN, Jani Gravel, MD .  traZODone (DESYREL) tablet 75 mg, 75 mg, Oral, Loma Sousa, MD, 75 mg at 12/04/16 2321 .  vitamin B-12 (CYANOCOBALAMIN) tablet 1,000 mcg, 1,000 mcg, Oral, Daily, Jani Gravel, MD, 1,000 mcg at 12/05/16 1325 .  vitamin C (ASCORBIC ACID) tablet 500 mg, 500 mg, Oral, BID, Jani Gravel, MD, 500 mg at 12/05/16 1031:  . [START ON 12/06/2016] aspirin EC  81 mg Oral Daily  . atorvastatin  10 mg Oral QHS  . brinzolamide  1 drop Right Eye BID  . carvedilol  18.75 mg Oral BID WC  . dicyclomine  10 mg Oral QID  . diphenoxylate-atropine  1 tablet Oral TID  . ferrous sulfate  325 mg Oral BID WC  . fluticasone  2 spray Each Nare BID  . furosemide  40 mg Intravenous BID  . hydrALAZINE  50 mg Oral BID  . [START ON 12/06/2016] isosorbide mononitrate  240 mg Oral Daily  . latanoprost  1 drop Right Eye QHS  . montelukast  10 mg Oral QHS  . multivitamin with minerals  1 tablet Oral Daily  . pantoprazole  40 mg Oral Daily  . prednisoLONE acetate  1 drop Left Eye Daily  . sodium chloride flush  3 mL Intravenous Q12H  . timolol  1 drop Right Eye BID  . traZODone  75 mg Oral QHS  . cyanocobalamin  1,000 mcg Oral Daily  . vitamin C  500 mg Oral BID  :  Allergies  Allergen Reactions  . Actos [Pioglitazone  Hydrochloride]   . Felodipine Er   . Hctz [Hydrochlorothiazide]   . Lisinopril   . Penicillins   . Plendil [Felodipine]   . Amlodipine Besylate Other (See Comments) and Hives  . Brimonidine Tartrate Other (See Comments)    Eye redness and itchiness, blurred vision  . Clonidine Derivatives Rash  . Combigan [Brimonidine Tartrate-Timolol] Other (See Comments)    Eye redness and itchiness, blurred vision  . Dorzolamide Other (See Comments)    Increased eye pressure, blurred vision  . Monopril [Fosinopril Sodium] Cough  . Norvasc [Amlodipine Besylate] Hives  :   Family History  Problem Relation Age of Onset  . Diabetes Unknown   . Hypertension Unknown   . Stroke Unknown   . Asthma Mother   .  Allergic rhinitis Mother   . Congestive Heart Failure Mother   . Stroke Mother    Social History   Socioeconomic History  . Marital status: Married    Spouse name: Not on file  . Number of children: 3  . Years of education: Not on file  . Highest education level: Not on file  Social Needs  . Financial resource strain: Not on file  . Food insecurity - worry: Not on file  . Food insecurity - inability: Not on file  . Transportation needs - medical: Not on file  . Transportation needs - non-medical: Not on file  Occupational History  . Occupation: retired  Tobacco Use  . Smoking status: Never Smoker  . Smokeless tobacco: Never Used  Substance and Sexual Activity  . Alcohol use: No    Alcohol/week: 0.0 oz  . Drug use: No  . Sexual activity: Not on file  Other Topics Concern  . Not on file  Social History Narrative  . Not on file    Review of Systems: As per HPA. All of the systems are negative.  Physical Exam:  Blood pressure (!) 145/40, pulse 66, temperature 98.8 F (37.1 C), temperature source Oral, resp. rate 18, height 5' (1.524 m), weight 146 lb 13.2 oz (66.6 kg), SpO2 97 %.  Fatigued elderly female, no respiratory distress HEENT: anicteric, MMM Lungs: Distant  breath sounds BL, no heezing or crackles Cardiac: S1/S2 regular, no murmurs Abdomen: soft, non-tender, non-distended Lymph nodes: No palpable lymphadenopathy Neurologic: Non-focal Skin: Dry, no rash Musculoskeletal: Unremarkable  LABS:  Recent Labs    12/04/16 1730 12/05/16 0154 12/05/16 1527  WBC 7.6 6.3  --   HGB 7.6* 7.9* 9.1*  HCT 24.4* 24.8* 28.3*  PLT 150 131*  --     Recent Labs    12/04/16 1730 12/05/16 0154  NA 136 137  K 5.4* 4.9  CL 111 112*  CO2 18* 19*  GLUCOSE 128* 107*  BUN 70* 68*  CREATININE 1.89* 1.80*  CALCIUM 8.8* 8.6*      RADIOLOGY:  Dg Inject Diag/thera/inc Needle/cath/plc Epi/lumb/sac W/img  Result Date: 11/12/2016 CLINICAL DATA:  Spondylosis without myelopathy. Back pain. Bilateral leg pain, presently worse on the right. FLUOROSCOPY TIME:  0 minutes 18 seconds. 28.26 micro gray meter squared PROCEDURE: The procedure, risks, benefits, and alternatives were explained to the patient. Questions regarding the procedure were encouraged and answered. The patient understands and consents to the procedure. LUMBAR EPIDURAL INJECTION: An interlaminar approach was performed on right at L4-5. The overlying skin was cleansed and anesthetized. A 20 gauge epidural needle was advanced using loss-of-resistance technique. DIAGNOSTIC EPIDURAL INJECTION: Injection of Isovue-M 200 shows a good epidural pattern with spread above and below the level of needle placement, primarily on the right. No vascular opacification is seen. THERAPEUTIC EPIDURAL INJECTION: One hundred twenty Mg of Depo-Medrol mixed with 3 cc 1% lidocaine were instilled. The procedure was well-tolerated, and the patient was discharged thirty minutes following the injection in good condition. COMPLICATIONS: None IMPRESSION: Technically successful epidural injection on the right at L4-5 # 1. Electronically Signed   By: Nelson Chimes M.D.   On: 11/12/2016 15:07    Assessment and Plan:  83yo female With  multiple comorbidities including chronic renal insufficiency admitted due to progressive anemia from mild/moderate baseline down to severe with associate progressive symptoms of fatigue and dyspnea with exertion. Differential includes blood loss anemia, likely due to G.I. Bleeding considering persistent diarrhea which may be representative of melena  although identification is complicated by concurrent iron intake vs hemolysis versus decreased production of the red blood cells due to primary bone suppression or nutritional deficiencies. Currently, no evidence of hemolysis based on normal LDH and normal bilirubin. Bone marrow suppression is possible all the patient does have reticular psychosis in her peripheral blood relative to the current globin value.  Her anemia is normal civic normochromic, admission values of folate and B12 are within normal range. Iron panel also shows normal values for both iron and ferritin, but her ferritin of 63 is likely inappropriately normal in the patient with chronic kidney insufficiency where we do expect normal ferritin values to exceed 300 for patient not to be considered iron deficient. Additionally, we do not have recent TSH value and needs to be obtained.  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 Bleeding coupled with possible progressive renal insufficiency resulting in insufficient erythropoietin production. There three-pointer level will be obtained.  Recommendations: -- additional lab work has been ordered including TSH and erythropoietin level -- initially, I have recommended gastroenterology assessment with possible endoscopy. Nevertheless, after speaking with the patient's son who is the primary decision-maker based on the patient's request, It is apparent that [name] and her son would be against conducting upper endoscopy especially if it requires placing patient under sedation as they are concerned that she may not be able to wake up  to being sedated. Both of them favor more supportive approach with comfort - oriented care. Hospice has been considered in discussions between the patient, her son, and their primary care provider. At this time, her son does not wish to have a full conversation with the petient herself, but he does want [place] to limit invasiveness of evaluations. With that in mind:  --Re-check H/H  --Recommend Hgb goal of 8.5-9.0 -- if below, transfuse 1 unit of pRBC with furosemide to reduce volume loading considering hyperkalemia and CHF --We will be available as needed tomorrow, but I will return on Friday to resume follow-up   Ardath Sax, MD 12/05/2016, 9:54 PM

## 2016-12-05 NOTE — Consult Note (Signed)
[facility] Consult  Referring Provider: Myrene Buddy, MD Primary Care Physician:  Ronita Hipps, MD Primary Gastroenterologist: Dr.Butler Tia Alert)  Reason for Consultation:  Anemia  HPI: Kelly Ruiz is a 83 y.o. female was admitted on 12/04/16 with complaints of fatigue, weakness, lack of energy, ongoing symptomatic anemia. Patient states she was in a usual state of health until a year ago, when she was noted to have anemia, and has required transfusions in the past. She is on a baby aspirin for congestive heart failure and possible TIA(negative MRIs as per her son at bedside). She does not take any other NSAIDs. She sees Dr. Melina Copa at [facility] who started her on iron supplementation, patient notes that her stools are dark, but denies black stools or bloody bowel movement. Patient denies difficulty swallowing or pain on swallowing. She denies weight loss and states she has gained 16 pounds the last few weeks likely related to CHF. Her bowel movements are variable, from a few a day, to multiple loose stools requiring Imodium. Her last colonoscopy was over 10 years ago. She has never had an endoscopy. Patient denies bloody bowel movements in the last few months, but states she was treated with antibiotics for diverticulitis.  Patient reports being short of breath, worsening with exertion, along with swelling of lower extremities.   Past Medical History:  Diagnosis Date  . CHF (congestive heart failure) (HCC)    Diastolic. Precipitated by Actos  . Chronic kidney disease    mild-moderate. GFR was 35 in 02/12  . Coronary artery disease 02/2010   abnormal nuclear stress test with mild ischemia in apical anterolateral wall.   . DM (diabetes mellitus) ([place])   . Dyslipidemia   . Hypertension   . Nasal polyps   . Unspecified hypertensive heart disease without heart failure     Past Surgical History:  Procedure Laterality Date  . APPENDECTOMY  1951  . CATARACT  EXTRACTION, BILATERAL    . CHOLECYSTECTOMY    . GLAUCOMA SURGERY  10/29/2013  . hysterectomy - unknown type    . NECK MASS EXCISION     non cancerous  . TONSILLECTOMY      Prior to Admission medications   Medication Sig Start Date End Date Taking? Authorizing Provider  acetaminophen (TYLENOL) 500 MG tablet Take 500 mg by mouth 2 (two) times daily.   Yes [provider]  atorvastatin (LIPITOR) 10 MG tablet Take 10 mg by mouth at bedtime.    Yes [provider]  brinzolamide (AZOPT) 1 % ophthalmic suspension Place 1 drop into the right eye 2 (two) times daily.    Yes [provider]  bumetanide (BUMEX) 1 MG tablet Take 0.5 tablets by mouth 2 (two) times daily. 06/13/16  Yes [provider]  Calcium Carbonate-Vitamin D (CALCIUM-VITAMIN D) 500-200 MG-UNIT per tablet Take 1 tablet by mouth 2 (two) times daily with a meal.   Yes [provider]  carvedilol (COREG) 12.5 MG tablet Take 1 tablet (12.5 mg total) by mouth 2 (two) times daily with a meal. 08/03/10  Yes Wellington Hampshire, MD  carvedilol (COREG) 6.25 MG tablet Take 1 tablet (6.25 mg total) 2 (two) times daily by mouth. Take with 12.5 mg to make 18.75mg  11/26/16  Yes Kilroy, Doreene Burke, PA-C  cyanocobalamin 1000 MCG tablet Take 1,000 mcg by mouth daily.    Yes [provider]  dicyclomine (BENTYL) 10 MG capsule Take 10 mg by mouth 4 (four) times daily.    Yes [provider]  diphenoxylate-atropine (LOMOTIL) 2.5-0.025 MG tablet Take 1 tablet by mouth 3 (three) times daily. 09/06/16  Yes [provider]  ferrous sulfate 325 (65 FE) MG tablet Take 325 mg by mouth 2 (two) times daily with a meal.    Yes [provider]  fluticasone (FLONASE) 50 MCG/ACT nasal spray Place 2 sprays into both nostrils 2 (two) times daily.   Yes [provider]  hydrALAZINE (APRESOLINE) 50 MG tablet Take 50 mg by mouth 2 (two) times daily.  11/17/13  Yes [provider]   Insulin Glargine (LANTUS SOLOSTAR) 100 UNIT/ML Solostar Pen Inject 2 Units into the skin daily as needed. B.S over 200 Sliding scale   Yes [provider]  isosorbide mononitrate (IMDUR) 120 MG 24 hr tablet Take 2 tablets (240 mg total) by mouth daily. 05/03/16  Yes Minus Breeding, MD  latanoprost (XALATAN) 0.005 % ophthalmic solution Place 1 drop into the right eye at bedtime.  01/20/13  Yes [provider]  Liniments (SALONPAS ARTHRITIS PAIN RELIEF EX) Apply 1 patch topically every 12 (twelve) hours.   Yes [provider]  losartan (COZAAR) 50 MG tablet Take 50 mg by mouth at bedtime.    Yes [provider]  montelukast (SINGULAIR) 10 MG tablet Take 10 mg by mouth at bedtime.   Yes [provider]  Multiple Vitamin (MULTIVITAMIN) tablet Take 1 tablet by mouth daily.   Yes [provider]  Multiple Vitamins-Minerals (EYE VITAMINS PO) Take by mouth 2 (two) times daily.   Yes [provider]  nitroGLYCERIN (NITROSTAT) 0.4 MG SL tablet Place 1 tablet (0.4 mg total) under the tongue every 5 (five) minutes as needed for chest pain. 03/22/15  Yes Minus Breeding, MD  Omega-3 Fatty Acids (FISH OIL) 1000 MG CPDR Take 1 capsule by mouth daily.   Yes [provider]  omeprazole (PRILOSEC) 20 MG capsule Take 20 mg by mouth daily.   Yes [provider]  prednisoLONE acetate (PRED FORTE) 1 % ophthalmic suspension Place 1 drop into the left eye daily. 09/03/16  Yes [provider]  timolol (TIMOPTIC) 0.5 % ophthalmic solution Place 1 drop into the right eye 2 (two) times daily.  01/14/12  Yes [provider]  traMADol (ULTRAM) 50 MG tablet Take 1 tablet (50 mg total) at bedtime as needed by mouth. 11/26/16  Yes Lyndal Pulley, DO  traZODone (DESYREL) 50 MG tablet Take 75 mg by mouth at bedtime. 09/28/16  Yes [provider]  vitamin C (ASCORBIC ACID) 500 MG tablet Take 500 mg by mouth 2 (two) times daily.     Yes [provider]  gabapentin (NEURONTIN) 100 MG capsule Take 1 capsule (100 mg total) by mouth at bedtime. Patient not taking: Reported on 12/04/2016 11/05/16   Lyndal Pulley, DO    Current Facility-Administered Medications  Medication Dose Route Frequency Provider Last Rate Last Dose  . 0.9 %  sodium chloride infusion  250 mL Intravenous PRN Jani Gravel, MD      . acetaminophen (TYLENOL) tablet 650 mg  650 mg Oral Q6H PRN Jani Gravel, MD       Or  . acetaminophen (TYLENOL) suppository 650 mg  650 mg Rectal Q6H PRN Jani Gravel, MD      . Derrill Memo ON 12/06/2016] aspirin EC tablet 81 mg  81 mg Oral Daily Danford, Suann Larry, MD      . atorvastatin (LIPITOR) tablet 10 mg  10 mg Oral QHS Jani Gravel,  MD   10 mg at 12/04/16 2320  . brinzolamide (AZOPT) 1 % ophthalmic suspension 1 drop  1 drop Right Eye BID Jani Gravel, MD   1 drop at 12/05/16 1027  . carvedilol (COREG) tablet 18.75 mg  18.75 mg Oral BID WC Danford, Suann Larry, MD   18.75 mg at 12/05/16 1053  . dicyclomine (BENTYL) capsule 10 mg  10 mg Oral QID Jani Gravel, MD   10 mg at 12/05/16 1455  . diphenoxylate-atropine (LOMOTIL) 2.5-0.025 MG per tablet 1 tablet  1 tablet Oral TID Jani Gravel, MD   1 tablet at 12/05/16 1455  . ferrous sulfate tablet 325 mg  325 mg Oral BID WC Jani Gravel, MD   325 mg at 12/05/16 1031  . fluticasone (FLONASE) 50 MCG/ACT nasal spray 2 spray  2 spray Each Nare BID Jani Gravel, MD   2 spray at 12/05/16 1028  . furosemide (LASIX) injection 40 mg  40 mg Intravenous BID Edwin Dada, MD   40 mg at 12/05/16 1456  . hydrALAZINE (APRESOLINE) tablet 50 mg  50 mg Oral BID Jani Gravel, MD   50 mg at 12/05/16 1032  . [START ON 12/06/2016] isosorbide mononitrate (IMDUR) 24 hr tablet 240 mg  240 mg Oral Daily Danford, Christopher P, MD      . latanoprost (XALATAN) 0.005 % ophthalmic solution 1 drop  1 drop Right Eye Loma Sousa, MD   1 drop at 12/04/16 2323  . montelukast (SINGULAIR) tablet 10 mg  10 mg  Oral QHS Jani Gravel, MD   10 mg at 12/04/16 2320  . multivitamin with minerals tablet 1 tablet  1 tablet Oral Daily Jani Gravel, MD   1 tablet at 12/05/16 1328  . nitroGLYCERIN (NITROSTAT) SL tablet 0.4 mg  0.4 mg Sublingual Q5 min PRN Jani Gravel, MD      . pantoprazole (PROTONIX) EC tablet 40 mg  40 mg Oral Daily Jani Gravel, MD   40 mg at 12/05/16 1034  . prednisoLONE acetate (PRED FORTE) 1 % ophthalmic suspension 1 drop  1 drop Left Eye Daily Jani Gravel, MD   1 drop at 12/05/16 1038  . sodium chloride flush (NS) 0.9 % injection 3 mL  3 mL Intravenous Q12H Jani Gravel, MD   3 mL at 12/05/16 1157  . sodium chloride flush (NS) 0.9 % injection 3 mL  3 mL Intravenous PRN Jani Gravel, MD      . timolol (TIMOPTIC) 0.5 % ophthalmic solution 1 drop  1 drop Right Eye BID Jani Gravel, MD   1 drop at 12/05/16 1041  . traMADol (ULTRAM) tablet 50 mg  50 mg Oral QHS PRN Jani Gravel, MD      . traZODone (DESYREL) tablet 75 mg  75 mg Oral Loma Sousa, MD   75 mg at 12/04/16 2321  . vitamin B-12 (CYANOCOBALAMIN) tablet 1,000 mcg  1,000 mcg Oral Daily Jani Gravel, MD   1,000 mcg at 12/05/16 1325  . vitamin C (ASCORBIC ACID) tablet 500 mg  500 mg Oral BID Jani Gravel, MD   500 mg at 12/05/16 1031    Allergies as of 12/04/2016 - Review Complete 12/04/2016  Allergen Reaction Noted  . Actos [pioglitazone hydrochloride]  07/10/2010  . Felodipine er  07/10/2010  . Hctz [hydrochlorothiazide]  08/14/2010  . Lisinopril  07/10/2010  . Penicillins  07/10/2010  . Plendil [felodipine]  01/22/2012  . Amlodipine besylate Other (See Comments) and Hives 07/10/2010  . Brimonidine tartrate Other (See  Comments) 01/22/2012  . Clonidine derivatives Rash 01/22/2012  . Combigan [brimonidine tartrate-timolol] Other (See Comments) 01/22/2012  . Dorzolamide Other (See Comments) 01/22/2012  . Monopril [fosinopril sodium] Cough 07/10/2010  . Norvasc [amlodipine besylate] Hives 07/10/2010    Family History  Problem Relation Age of  Onset  . Diabetes Unknown   . Hypertension Unknown   . Stroke Unknown   . Asthma Mother   . Allergic rhinitis Mother   . Congestive Heart Failure Mother   . Stroke Mother     Social History   Socioeconomic History  . Marital status: Married    Spouse name: Not on file  . Number of children: 3  . Years of education: Not on file  . Highest education level: Not on file  Social Needs  . Financial resource strain: Not on file  . Food insecurity - worry: Not on file  . Food insecurity - inability: Not on file  . Transportation needs - medical: Not on file  . Transportation needs - non-medical: Not on file  Occupational History  . Occupation: retired  Tobacco Use  . Smoking status: Never Smoker  . Smokeless tobacco: Never Used  Substance and Sexual Activity  . Alcohol use: No    Alcohol/week: 0.0 oz  . Drug use: No  . Sexual activity: Not on file  Other Topics Concern  . Not on file  Social History Narrative  . Not on file    Review of Systems: Positive for: GI: Described in detail in HPI.    Gen: anorexia, fatigue, weakness, malaise, Denies any fever, chills, rigors, night sweats, involuntary weight loss, and sleep disorder CV: orthopnea, PND, peripheral edema, Denies chest pain, angina, palpitations, syncope, , and claudication. Resp: dyspnea, Denies cough, sputum, wheezing, coughing up blood. GU : Denies urinary burning, blood in urine, urinary frequency, urinary hesitancy, nocturnal urination, and urinary incontinence. MS: Denies joint pain or swelling.  Denies muscle weakness, cramps, atrophy.  Derm: Denies rash, itching, oral ulcerations, hives, unhealing ulcers.  Psych: Denies depression, anxiety, memory loss, suicidal ideation, hallucinations,  and confusion. Heme: Denies bruising, bleeding, and enlarged lymph nodes. Neuro:  Denies any headaches, dizziness, paresthesias. Endo:  problems with DM,Denies any  thyroid, adrenal function.  Physical Exam: Vital signs  in last 24 hours: Temp:  [97.6 F (36.4 C)-98.3 F (36.8 C)] 98.3 F (36.8 C) (11/21 1153) Pulse Rate:  [60-98] 60 (11/21 1153) Resp:  [14-18] 18 (11/21 1153) BP: (120-154)/(43-60) 153/52 (11/21 1153) SpO2:  [98 %-100 %] 100 % (11/21 1153) Weight:  [66.6 kg (146 lb 13.2 oz)-67 kg (147 lb 11.3 oz)] 66.6 kg (146 lb 13.2 oz) (11/21 0408) Last BM Date: 12/03/16  General:   Alert,  Well-developed, well-nourished, pleasant and cooperative in NAD, appears her stated age Head:  Normocephalic and atraumatic. Eyes:  Sclera clear, no icterus.   Obvious pallor Ears:  Normal auditory acuity. Nose:  No deformity, discharge,  or lesions. Mouth:  No deformity or lesions.  Oropharynx pink & moist. Neck:  Supple; no masses or thyromegaly. Lungs:  Clear throughout to auscultation.   No wheezes, crackles, or rhonchi. No acute distress. Heart:  Regular rate and rhythm; no murmurs, clicks, rubs,  or gallops. Extremities:  No clubbing, bilateral pitting edema  Neurologic:  Alert and  oriented x4;  grossly normal neurologically. Skin:  Intact without significant lesions or rashes. Psych:  Alert and cooperative. Normal mood and affect. Abdomen:  Soft, nontender and nondistended. No masses, hepatosplenomegaly or hernias noted. Normal  bowel sounds, without guarding, and without rebound.         Lab Results: Recent Labs    12/04/16 1730 12/05/16 0154 12/05/16 1527  WBC 7.6 6.3  --   HGB 7.6* 7.9* 9.1*  HCT 24.4* 24.8* 28.3*  PLT 150 131*  --    BMET Recent Labs    12/04/16 1730 12/05/16 0154  NA 136 137  K 5.4* 4.9  CL 111 112*  CO2 18* 19*  GLUCOSE 128* 107*  BUN 70* 68*  CREATININE 1.89* 1.80*  CALCIUM 8.8* 8.6*   LFT Recent Labs    12/05/16 0154  PROT 4.3*  ALBUMIN 2.3*  AST 17  ALT 26  ALKPHOS 68  BILITOT 0.6   PT/INR Recent Labs    12/04/16 1730  LABPROT 13.4  INR 1.03    Studies/Results: No results found.  Impression: 1. Symptomatic anemia requiring blood  transfusions, hemoglobin 7.6 on presentation, 9.1 posttransfusion 2. Elevated BUN/creatinine ratio, dark stools, possible upper GI bleeding 3. Iron profile shows ferritin of 63, saturation 15%, normal TIBC 279 4. Impaired renal function, GFR 27 5. Low albumin  Plan: I discussed in details regarding her laboratory finding and clinical status with the patient as well as her son at bedside. Recommended a diagnostic endoscopy to rule out peptic ulcer disease, gastric mass, AVMs- which could lead to chronic blood loss and anemia. They do not want an endoscopy. I discussed the risk and benefits of the procedure and that she would need sedation. Patient does not want any procedures with sedation including an endoscopy. Patient is on baby aspirin, which should be avoided if possible. Patient is on Protonix 40 mg and may benefit from increasing it to twice a day. Recommended to avoid NSAIDs if possible.  Please recall if patient once an endoscopy or diagnostic procedures.    LOS: 0 days   Ronnette Juniper, M.D.  12/05/2016, 4:38 PM  Pager [phone] If no answer or after 5 PM call [phone]

## 2016-12-05 NOTE — Evaluation (Signed)
Physical Therapy Evaluation Patient Details Name: Kelly Ruiz MRN: [id number] DOB: 03-25-1923 Today's Date: 12/05/2016   History of Present Illness  Pt is a 83 y/o female admitted secondary to symptomatic anemia. Pt was sent to hospital by MD secondary to low hemoglobin. PMH includes CHF, CKD, HTN, DM, and CAD.   Clinical Impression  Pt admitted secondary to problem above with deficits below. PTA, pt was using rollator for ambulation and had aides to assist with ADLs and IADLs. Upon eval, pt presenting with weakness, LE swelling, decreased balance, and fatigue. Required min guard assist for mobility this session. Pt has CNAs available for all but 4 hours during the day, and during those 4 hours, pt's daughter checks in on pt. Has all necessary DME. Recommending HHPT at d/c to increase independence and safety with functional mobility. Will continue to follow acutely to continue mobility progression.     Follow Up Recommendations Home health PT;Supervision for mobility/OOB    Equipment Recommendations  None recommended by PT    Recommendations for Other Services       Precautions / Restrictions Precautions Precautions: Fall Restrictions Weight Bearing Restrictions: No      Mobility  Bed Mobility               General bed mobility comments: In chair upon entry   Transfers Overall transfer level: Needs assistance Equipment used: Rolling walker (2 wheeled) Transfers: Sit to/from Stand Sit to Stand: Min guard         General transfer comment: Min guard for safety. Verval cues for safe hand placement.  Ambulation/Gait Ambulation/Gait assistance: Min guard;Supervision Ambulation Distance (Feet): 50 Feet Assistive device: Rolling walker (2 wheeled) Gait Pattern/deviations: Step-through pattern;Decreased stride length;Trunk flexed Gait velocity: decreased Gait velocity interpretation: Below normal speed for age/gender General Gait Details: Slow, overall steady gait  with use of RW. Required cues for upright posture and to look ahead. Pt reported weakness and fatigue, therefore, further distance limited.   Stairs            Wheelchair Mobility    Modified Rankin (Stroke Patients Only)       Balance Overall balance assessment: Needs assistance Sitting-balance support: No upper extremity supported;Feet supported Sitting balance-Leahy Scale: Good     Standing balance support: Bilateral upper extremity supported;During functional activity Standing balance-Leahy Scale: Poor Standing balance comment: reliant on RW for stability.                              Pertinent Vitals/Pain Pain Assessment: No/denies pain    Home Living Family/patient expects to be discharged to:: Private residence Living Arrangements: Spouse/significant other Available Help at Discharge: Family;Personal care attendant Type of Home: House Home Access: Stairs to enter Entrance Stairs-Rails: Psychiatric nurse of Steps: 4 Home Layout: Laundry or work area in basement;Two level;Able to live on main level with bedroom/bathroom Home Equipment: Grab bars - tub/shower;Grab bars - toilet;Tub bench;Walker - 4 wheels;Cane - single point;Bedside commode Additional Comments: CNA is there from 10 pm to 8 am, 8am to 1 pm, and from 5 pm to 10 pm. Daughter is there during the day.     Prior Function Level of Independence: Needs assistance   Gait / Transfers Assistance Needed: Used rollator for ambulation   ADL's / Homemaking Assistance Needed: Aides assist with bathing and dressing and IADLs.         Hand Dominance   Dominant Hand: Right  Extremity/Trunk Assessment   Upper Extremity Assessment Upper Extremity Assessment: RUE deficits/detail;LUE deficits/detail RUE Deficits / Details: Reports occasional numbness in fingers.  LUE Deficits / Details: Reports occasional numbness in fingers.     Lower Extremity Assessment Lower Extremity  Assessment: Generalized weakness;RLE deficits/detail;LLE deficits/detail RLE Deficits / Details: Pitting edema noted in ankles.  LLE Deficits / Details: Pitting edema noted in ankles.     Cervical / Trunk Assessment Cervical / Trunk Assessment: Kyphotic  Communication   Communication: No difficulties  Cognition Arousal/Alertness: Awake/alert Behavior During Therapy: WFL for tasks assessed/performed Overall Cognitive Status: Within Functional Limits for tasks assessed                                        General Comments General comments (skin integrity, edema, etc.): Pt's son present during session. Educated about need for HHPT and pt and family agreeable. Educated about seated and supine ther ex to perform.     Exercises     Assessment/Plan    PT Assessment Patient needs continued PT services  PT Problem List Decreased strength;Decreased balance;Decreased mobility;Decreased knowledge of use of DME;Pain;Decreased activity tolerance       PT Treatment Interventions DME instruction;Gait training;Stair training;Functional mobility training;Therapeutic activities;Therapeutic exercise;Neuromuscular re-education;Balance training;Patient/family education    PT Goals (Current goals can be found in the Care Plan section)  Acute Rehab PT Goals Patient Stated Goal: to go home  PT Goal Formulation: With patient Time For Goal Achievement: 12/12/16 Potential to Achieve Goals: Good    Frequency Min 3X/week   Barriers to discharge        Co-evaluation               AM-PAC PT "6 Clicks" Daily Activity  Outcome Measure Difficulty turning over in bed (including adjusting bedclothes, sheets and blankets)?: A Little Difficulty moving from lying on back to sitting on the side of the bed? : Unable Difficulty sitting down on and standing up from a chair with arms (e.g., wheelchair, bedside commode, etc,.)?: Unable Help needed moving to and from a bed to chair  (including a wheelchair)?: A Little Help needed walking in hospital room?: A Little Help needed climbing 3-5 steps with a railing? : A Little 6 Click Score: 14    End of Session Equipment Utilized During Treatment: Gait belt Activity Tolerance: Patient tolerated treatment well Patient left: in chair;with call bell/phone within reach;with family/visitor present Nurse Communication: Mobility status PT Visit Diagnosis: Muscle weakness (generalized) (M62.81);Unsteadiness on feet (R26.81)    Time: 9379-0240 PT Time Calculation (min) (ACUTE ONLY): 32 min   Charges:   PT Evaluation $PT Eval Low Complexity: 1 Low PT Treatments $Gait Training: 8-22 mins   PT G Codes:        Leighton Ruff, PT, DPT  Acute Rehabilitation Services  Pager: [phone]   Rudean Hitt 12/05/2016, 1:04 PM

## 2016-12-05 NOTE — Progress Notes (Addendum)
PROGRESS NOTE    Kelly Ruiz  IZT:[id number] DOB: 02-18-1923 DOA: 12/04/2016 PCP: Ronita Hipps, MD      Brief Narrative:  83 yo F with CKD IV, HFpEF, chronic anemia presented with dyspnea, anemia.  The patient has a chronic anemia, baseline Hgb 9-10 g/dL, recent drop, unclear etiology.  She had gross rectal bleeding back in April, admitted to [facility].  At that time colonoscopy was deferred, presumed to be diverticular bleed.  Since then, has been following up with Dr. Melina Copa, GI in [place] who has noted persistent FOBT+.  Since then, she has been taking iron supplements and B12 supplements.  Has had NO further rectal bleeding nor melena that she or caretakers have recognized.  Has had diarrhea (5-6 loose BM per day), which recently improved with Lomotil.   Now, recently, she has been very dyspneic with exertion.  She has had leg swelling.  She has been tired.  She has had no gross bleeding (hematuria, rectal, vaginal, nose) that she knows of.  She went to her GI Dr. Melina Copa, who found her Hgb 7.5 g/dL and FOBT+ so she was sent to the ER.  Admitted and transfused 1 unit.  Started on her home diuretics and also IV diuretics.       Assessment & Plan:  Principal Problem:   Symptomatic anemia Active Problems:   Chronic diastolic congestive heart failure (HCC)   Chronic renal insufficiency, stage IV (severe) (HCC)   Insulin dependent diabetes mellitus with renal manifestation (HCC)   Hyperkalemia   1. Acute on chronic diastolic CHF: Cardiogram last June showed EF 65-70% grade 1 DD, no significant valvular disease.  Likely this exacerbation is from acute on chronic anemia.  She is still dyspneic with exertion when she ambulates with nursing today. -Hold bumetanide -Furosemide 40 mg IV twice a day  -K supplement -Strict I/Os, daily weights, telemetry  -Daily monitoring renal function -Continue hydralazine, Coreg, Imdur, statin -Hold ARB while diuresing   2.  Acute on chronic  anemia: This is chronic, with a baseline 9-10 g/dL.  Likely from renal disease.  Our last Hgb was in the [facility] system, only three weeks ago, actually showed Hgb 10 and normal MCV.  Yesterday, she had a new drop in Hgb, new macrocytosis.  Ferritin is low for expected given renal disease, B12 and folate normal.  Retic index 1.5 only, hypoproliferative.  Transfused 1 unit overnight. I discussed with Dr. Melina Copa and our GI, who felt that EGD indicated only if strong suspicion for GI blood loss, which I think is doubtful.  Bili normal, doubt hemolysis. Normal WBC, minimal thrombocytopenia.  Suspect EPO def vs MDS. -Consult to Heme -Daily CBC -Continue iron  3.  Chronic kidney disease, stage IV: Stable -Daily renal function  4.  Diabetes: Diet controlled  5.  Severe protein calorie malnutrition: -Continue supplement  6.  Other medications -Continue eye drops -Continue Lomotil and dicyclomine        DVT prophylaxis: SCDs Code Status: FULL Family Communication: Son at bedside Disposition Plan: Adjust diuretics today, increase IV diuretics.  Monitor renal function closely, consult to Hematology.  Given her advanced renal disease, continued fluid overload, titration of diuretics, expect she will require >2 midnights stay.     Consultants:   Hematology  Procedures:   None     Subjective: Feeling tired.  Still dyspneic with ambulation.  Weak all over.  No new chest pain, fever, cough, sputum.  Legs still swollen.  Objective: Vitals:   12/05/16  0028 12/05/16 0117 12/05/16 0408 12/05/16 1153  BP: (!) 131/59 (!) 131/59 (!) 141/46 (!) 153/52  Pulse: 69 69 98 60  Resp: 16 16 16 18   Temp: 98.1 F (36.7 C) 98.1 F (36.7 C) 98.3 F (36.8 C) 98.3 F (36.8 C)  TempSrc: Oral Oral Oral Oral  SpO2: 98%  98% 100%  Weight:  66.7 kg (147 lb 0 oz) 66.6 kg (146 lb 13.2 oz)   Height:  5' (1.524 m)      Intake/Output Summary (Last 24 hours) at 12/05/2016 1218 Last data filed at  12/05/2016 1033 Gross per 24 hour  Intake 840 ml  Output 1951 ml  Net -1111 ml   Filed Weights   12/04/16 2200 12/05/16 0117 12/05/16 0408  Weight: 66.7 kg (147 lb) 66.7 kg (147 lb 0 oz) 66.6 kg (146 lb 13.2 oz)    Examination: General appearance: Elderly adult female, alert, able to interact HEENT: Anicteric, conjunctiva pink, lids and lashes normal. No nasal deformity, discharge, epistaxis.  Lips dry.   Skin: Warm and dry.  Mild pallor, no jaundice.  No suspicious rashes or lesions. Cardiac: RRR, nl S1-S2, no murmurs appreciated.  JVP not visible.  2+ LE edema.  Radial pulses 2+ and symmetric. Respiratory: Normal respiratory rate and rhythm.  No wheezes, rales at bases.. Abdomen: Abdomen soft.  No TTP. No ascites, distension, hepatosplenomegaly.   MSK: No deformities or effusions. Neuro: Awake and alert.  EOMI, moves all extremities. Speech fluent.    Psych: Sensorium intact and responding to questions, attention normal. Affect flat.  Judgment and insight appear somewhat affected by age.    Data Reviewed: I have personally reviewed following labs and imaging studies:  CBC: Recent Labs  Lab 12/04/16 1730 12/05/16 0154  WBC 7.6 6.3  HGB 7.6* 7.9*  HCT 24.4* 24.8*  MCV 103.0* 98.4  PLT 150 295*   Basic Metabolic Panel: Recent Labs  Lab 12/04/16 1730 12/05/16 0154  NA 136 137  K 5.4* 4.9  CL 111 112*  CO2 18* 19*  GLUCOSE 128* 107*  BUN 70* 68*  CREATININE 1.89* 1.80*  CALCIUM 8.8* 8.6*   GFR: Estimated Creatinine Clearance: 17.3 mL/min (A) (by C-G formula based on SCr of 1.8 mg/dL (H)). Liver Function Tests: Recent Labs  Lab 12/04/16 1730 12/05/16 0154  AST 18 17  ALT 29 26  ALKPHOS 82 68  BILITOT 0.3 0.6  PROT 4.7* 4.3*  ALBUMIN 2.5* 2.3*   No results for input(s): LIPASE, AMYLASE in the last 168 hours. No results for input(s): AMMONIA in the last 168 hours. Coagulation Profile: Recent Labs  Lab 12/04/16 1730  INR 1.03   Cardiac Enzymes: No  results for input(s): CKTOTAL, CKMB, CKMBINDEX, TROPONINI in the last 168 hours. BNP (last 3 results) No results for input(s): PROBNP in the last 8760 hours. HbA1C: No results for input(s): HGBA1C in the last 72 hours. CBG: No results for input(s): GLUCAP in the last 168 hours. Lipid Profile: No results for input(s): CHOL, HDL, LDLCALC, TRIG, CHOLHDL, LDLDIRECT in the last 72 hours. Thyroid Function Tests: No results for input(s): TSH, T4TOTAL, FREET4, T3FREE, THYROIDAB in the last 72 hours. Anemia Panel: Recent Labs    12/04/16 1730  VITAMINB12 2,990*  FOLATE 60.9  FERRITIN 63  TIBC 279  IRON 41  RETICCTPCT 5.2*   Urine analysis: No results found for: COLORURINE, APPEARANCEUR, LABSPEC, PHURINE, GLUCOSEU, HGBUR, BILIRUBINUR, KETONESUR, PROTEINUR, UROBILINOGEN, NITRITE, LEUKOCYTESUR Sepsis Labs: @LABRCNTIP (procalcitonin:4,lacticidven:4)  )No results found for this or any  previous visit (from the past 240 hour(s)).       Radiology Studies: No results found.      Scheduled Meds: . atorvastatin  10 mg Oral QHS  . brinzolamide  1 drop Right Eye BID  . carvedilol  18.75 mg Oral BID WC  . dicyclomine  10 mg Oral QID  . diphenoxylate-atropine  1 tablet Oral TID  . ferrous sulfate  325 mg Oral BID WC  . fluticasone  2 spray Each Nare BID  . furosemide  40 mg Intravenous BID  . hydrALAZINE  50 mg Oral BID  . isosorbide mononitrate  240 mg Oral Daily  . latanoprost  1 drop Right Eye QHS  . losartan  50 mg Oral QHS  . montelukast  10 mg Oral QHS  . multivitamin with minerals  1 tablet Oral Daily  . pantoprazole  40 mg Oral Daily  . potassium chloride  40 mEq Oral Daily  . prednisoLONE acetate  1 drop Left Eye Daily  . sodium chloride flush  3 mL Intravenous Q12H  . timolol  1 drop Right Eye BID  . traZODone  75 mg Oral QHS  . cyanocobalamin  1,000 mcg Oral Daily  . vitamin C  500 mg Oral BID   Continuous Infusions: . sodium chloride       LOS: 0 days    Time  spent: 45 minutes    Edwin Dada, MD [facility] Pager [phone]  If 7PM-7AM, please contact night-coverage [email or web address] Password [facility] 12/05/2016, 12:18 PM

## 2016-12-06 LAB — BASIC METABOLIC PANEL
Anion gap: 7 (ref 5–15)
BUN: 61 mg/dL — AB (ref 6–20)
CO2: 22 mmol/L (ref 22–32)
CREATININE: 1.74 mg/dL — AB (ref 0.44–1.00)
Calcium: 8.4 mg/dL — ABNORMAL LOW (ref 8.9–10.3)
Chloride: 109 mmol/L (ref 101–111)
GFR calc Af Amer: 28 mL/min — ABNORMAL LOW (ref >60)
GFR, EST NON AFRICAN AMERICAN: 24 mL/min — AB (ref >60)
GLUCOSE: 116 mg/dL — AB (ref 65–99)
Potassium: 4 mmol/L (ref 3.5–5.1)
SODIUM: 138 mmol/L (ref 135–145)

## 2016-12-06 LAB — HAPTOGLOBIN: Haptoglobin: 234 mg/dL — ABNORMAL HIGH (ref 34–200)

## 2016-12-06 LAB — CBC
HCT: 25.6 % — ABNORMAL LOW (ref 36.0–46.0)
Hemoglobin: 8.3 g/dL — ABNORMAL LOW (ref 12.0–15.0)
MCH: 32.3 pg (ref 26.0–34.0)
MCHC: 32.4 g/dL (ref 30.0–36.0)
MCV: 99.6 fL (ref 78.0–100.0)
PLATELETS: 135 10*3/uL — AB (ref 150–400)
RBC: 2.57 MIL/uL — ABNORMAL LOW (ref 3.87–5.11)
RDW: 18.4 % — AB (ref 11.5–15.5)
WBC: 6.4 10*3/uL (ref 4.0–10.5)

## 2016-12-06 LAB — GLUCOSE, CAPILLARY: Glucose-Capillary: 107 mg/dL — ABNORMAL HIGH (ref 65–99)

## 2016-12-06 LAB — PREPARE RBC (CROSSMATCH)

## 2016-12-06 LAB — ERYTHROPOIETIN: Erythropoietin: 17.7 m[IU]/mL (ref 2.6–18.5)

## 2016-12-06 MED ORDER — SODIUM CHLORIDE 0.9 % IV SOLN
Freq: Once | INTRAVENOUS | Status: AC
  Administered 2016-12-06: 16:00:00 via INTRAVENOUS

## 2016-12-06 MED ORDER — BUMETANIDE 0.5 MG PO TABS
0.5000 mg | ORAL_TABLET | Freq: Two times a day (BID) | ORAL | Status: DC
  Administered 2016-12-07: 0.5 mg via ORAL
  Filled 2016-12-06: qty 1

## 2016-12-06 NOTE — Progress Notes (Signed)
PROGRESS NOTE    Kelly Ruiz  NUU:[id number] DOB: [birth year] DOA: 12/04/2016 PCP: Ronita Hipps, MD      Brief Narrative:  83 yo F with CKD IV, HFpEF, chronic anemia presented with dyspnea, anemia.  The patient has a chronic anemia, baseline Hgb 9-10 g/dL, recent drop, unclear etiology.  She had gross rectal bleeding back in April, admitted to [facility].  At that time colonoscopy was deferred, presumed to be diverticular bleed.  Since then, has been following up with Dr. Melina Copa, GI in [place] who has noted persistent FOBT+.  Since then, she has been taking iron supplements and B12 supplements.  Has had NO further rectal bleeding nor melena that she or caretakers have recognized.  Has had diarrhea (5-6 loose BM per day), which recently improved with Lomotil.   Now, recently, she has been very dyspneic with exertion.  She has had leg swelling.  She has been tired.  She has had no gross bleeding (hematuria, rectal, vaginal, nose) that she knows of.  She went to her GI Dr. Melina Copa, who found her Hgb 7.5 g/dL and FOBT+ so she was sent to the ER.  Admitted and transfused 1 unit.  Started on her home diuretics and also IV diuretics.       Assessment & Plan:  Principal Problem:   Symptomatic anemia Active Problems:   Chronic diastolic congestive heart failure (HCC)   Chronic renal insufficiency, stage IV (severe) (HCC)   Insulin dependent diabetes mellitus with renal manifestation (HCC)   Hyperkalemia   Acute on chronic diastolic (congestive) heart failure ([place])   1. Acute on chronic diastolic CHF: Improved, brisk diuresis yesterday -Restart bumetanide -Stop furosemide -Strict I/Os, daily weights, telemetry  -Daily monitoring renal function -Continue hydralazine, Coreg, Imdur, statin -Restart ARB    2.  Acute on chronic anemia: Probably slow GIB, EPO level normal.  Will get total of 2 units so far this hospitalization. -Consult to Heme, appreciate cares -Transfuse again for Hgb  < 8.5 g/dL -Continue iron  3.  Chronic kidney disease, stage IV: Stable -Daily renal function  4.  Diabetes: Diet controlled  5.  Severe protein calorie malnutrition: -Continue supplement  6.  Other medications -Continue eye drops -Continue Lomotil and dicyclomine        DVT prophylaxis: SCDs Code Status: FULL Family Communication: Son, daughter and husband at bedside Disposition Plan: Decrease diuretics, repeat transfusion and check Hgb in AM.       Consultants:  Hematology [facility]  Procedures:   None     Subjective: Feeling somewhat better, still very tired, smoewhat dyspneic with exertion. No new chest pain, fever, cough, sputum.  Legs better.  Objective: Vitals:   12/05/16 1926 12/05/16 2248 12/05/16 2348 12/06/16 0641  BP: (!) 145/40 (!) 171/48 135/69 (!) 160/59  Pulse: 66 71 76 60  Resp: 18  18 18   Temp: 98.8 F (37.1 C)  98.6 F (37 C) 97.7 F (36.5 C)  TempSrc: Oral  Oral Oral  SpO2: 97%  96% 98%  Weight:    63.2 kg (139 lb 7 oz)  Height:        Intake/Output Summary (Last 24 hours) at 12/06/2016 1221 Last data filed at 12/06/2016 1037 Gross per 24 hour  Intake 780 ml  Output 1402 ml  Net -622 ml   Filed Weights   12/05/16 0117 12/05/16 0408 12/06/16 0641  Weight: 66.7 kg (147 lb 0 oz) 66.6 kg (146 lb 13.2 oz) 63.2 kg (139 lb 7 oz)  Examination: General appearance: Elderly adult female, alert, able to interact HEENT: Anicteric, conjunctiva pink, lids and lashes normal. No nasal deformity, discharge, epistaxis.  Lips dry.   Skin: Warm and dry.  Pallor resolved.  No suspicious rashes or lesions. Cardiac: RRR, nl S1-S2, no murmurs appreciated.  JVP not visible.  No LE edema.  Radial pulses 2+ and symmetric. Respiratory: Normal respiratory rate and rhythm.  No wheezes, rales at bases.. Abdomen: Abdomen soft.  No TTP. No ascites, distension, hepatosplenomegaly.   MSK: No deformities or effusions. Neuro: Awake and  alert.  EOMI, moves all extremities. Speech fluent.    Psych: Sensorium intact and responding to questions, attention normal. Affect pleasant.  Judgment and insight appear somewhat affected by age.    Data Reviewed: I have personally reviewed following labs and imaging studies:  CBC: Recent Labs  Lab 12/04/16 1730 12/05/16 0154 12/05/16 1527 12/06/16 0340  WBC 7.6 6.3  --  6.4  HGB 7.6* 7.9* 9.1* 8.3*  HCT 24.4* 24.8* 28.3* 25.6*  MCV 103.0* 98.4  --  99.6  PLT 150 131*  --  350*   Basic Metabolic Panel: Recent Labs  Lab 12/04/16 1730 12/05/16 0154 12/06/16 0340  NA 136 137 138  K 5.4* 4.9 4.0  CL 111 112* 109  CO2 18* 19* 22  GLUCOSE 128* 107* 116*  BUN 70* 68* 61*  CREATININE 1.89* 1.80* 1.74*  CALCIUM 8.8* 8.6* 8.4*   GFR: Estimated Creatinine Clearance: 17.5 mL/min (A) (by C-G formula based on SCr of 1.74 mg/dL (H)). Liver Function Tests: Recent Labs  Lab 12/04/16 1730 12/05/16 0154  AST 18 17  ALT 29 26  ALKPHOS 82 68  BILITOT 0.3 0.6  PROT 4.7* 4.3*  ALBUMIN 2.5* 2.3*   No results for input(s): LIPASE, AMYLASE in the last 168 hours. No results for input(s): AMMONIA in the last 168 hours. Coagulation Profile: Recent Labs  Lab 12/04/16 1730  INR 1.03   Cardiac Enzymes: No results for input(s): CKTOTAL, CKMB, CKMBINDEX, TROPONINI in the last 168 hours. BNP (last 3 results) No results for input(s): PROBNP in the last 8760 hours. HbA1C: No results for input(s): HGBA1C in the last 72 hours. CBG: Recent Labs  Lab 12/05/16 0743  GLUCAP 107*   Lipid Profile: No results for input(s): CHOL, HDL, LDLCALC, TRIG, CHOLHDL, LDLDIRECT in the last 72 hours. Thyroid Function Tests: Recent Labs    12/05/16 1527  TSH 1.100   Anemia Panel: Recent Labs    12/04/16 1730  VITAMINB12 2,990*  FOLATE 60.9  FERRITIN 63  TIBC 279  IRON 41  RETICCTPCT 5.2*   Urine analysis: No results found for: COLORURINE, APPEARANCEUR, LABSPEC, PHURINE, GLUCOSEU,  HGBUR, BILIRUBINUR, KETONESUR, PROTEINUR, UROBILINOGEN, NITRITE, LEUKOCYTESUR Sepsis Labs: @LABRCNTIP (procalcitonin:4,lacticidven:4)  )No results found for this or any previous visit (from the past 240 hour(s)).       Radiology Studies: No results found.      Scheduled Meds: . aspirin EC  81 mg Oral Daily  . atorvastatin  10 mg Oral QHS  . brinzolamide  1 drop Right Eye BID  . [START ON 12/07/2016] bumetanide  0.5 mg Oral BID  . carvedilol  18.75 mg Oral BID WC  . dicyclomine  10 mg Oral QID  . diphenoxylate-atropine  1 tablet Oral TID  . ferrous sulfate  325 mg Oral BID WC  . fluticasone  2 spray Each Nare BID  . hydrALAZINE  50 mg Oral BID  . isosorbide mononitrate  240 mg Oral Daily  .  latanoprost  1 drop Right Eye QHS  . montelukast  10 mg Oral QHS  . multivitamin with minerals  1 tablet Oral Daily  . pantoprazole  40 mg Oral Daily  . prednisoLONE acetate  1 drop Left Eye Daily  . sodium chloride flush  3 mL Intravenous Q12H  . timolol  1 drop Right Eye BID  . traZODone  75 mg Oral QHS  . cyanocobalamin  1,000 mcg Oral Daily  . vitamin C  500 mg Oral BID   Continuous Infusions: . sodium chloride    . sodium chloride       LOS: 1 day    Time spent: 20 minutes    Edwin Dada, MD [facility] Pager [phone]  If 7PM-7AM, please contact night-coverage [email or web address] Password [facility] 12/06/2016, 12:21 PM

## 2016-12-07 LAB — BPAM RBC
BLOOD PRODUCT EXPIRATION DATE: 201811292359
Blood Product Expiration Date: 201811292359
ISSUE DATE / TIME: 201811201926
ISSUE DATE / TIME: 201811221556
UNIT TYPE AND RH: 7300
Unit Type and Rh: 7300

## 2016-12-07 LAB — BASIC METABOLIC PANEL
ANION GAP: 8 (ref 5–15)
BUN: 53 mg/dL — ABNORMAL HIGH (ref 6–20)
CALCIUM: 8.1 mg/dL — AB (ref 8.9–10.3)
CHLORIDE: 108 mmol/L (ref 101–111)
CO2: 23 mmol/L (ref 22–32)
Creatinine, Ser: 1.63 mg/dL — ABNORMAL HIGH (ref 0.44–1.00)
GFR calc non Af Amer: 26 mL/min — ABNORMAL LOW (ref >60)
GFR, EST AFRICAN AMERICAN: 31 mL/min — AB (ref >60)
GLUCOSE: 97 mg/dL (ref 65–99)
Potassium: 4 mmol/L (ref 3.5–5.1)
Sodium: 139 mmol/L (ref 135–145)

## 2016-12-07 LAB — CBC
HEMATOCRIT: 29.1 % — AB (ref 36.0–46.0)
HEMOGLOBIN: 9.5 g/dL — AB (ref 12.0–15.0)
MCH: 31.7 pg (ref 26.0–34.0)
MCHC: 32.6 g/dL (ref 30.0–36.0)
MCV: 97 fL (ref 78.0–100.0)
Platelets: 129 10*3/uL — ABNORMAL LOW (ref 150–400)
RBC: 3 MIL/uL — AB (ref 3.87–5.11)
RDW: 18.7 % — ABNORMAL HIGH (ref 11.5–15.5)
WBC: 6.3 10*3/uL (ref 4.0–10.5)

## 2016-12-07 LAB — TYPE AND SCREEN
ABO/RH(D): B POS
ANTIBODY SCREEN: NEGATIVE
UNIT DIVISION: 0
Unit division: 0

## 2016-12-07 MED ORDER — CARVEDILOL 6.25 MG PO TABS
18.7500 mg | ORAL_TABLET | Freq: Two times a day (BID) | ORAL | Status: DC
  Administered 2016-12-07: 06:00:00 18.75 mg via ORAL
  Filled 2016-12-07: qty 1

## 2016-12-07 MED ORDER — HYDRALAZINE HCL 50 MG PO TABS
50.0000 mg | ORAL_TABLET | Freq: Two times a day (BID) | ORAL | Status: DC
  Administered 2016-12-07: 50 mg via ORAL
  Filled 2016-12-07 (×2): qty 1

## 2016-12-07 MED ORDER — METOLAZONE 2.5 MG PO TABS: 2.5000 mg | ORAL_TABLET | Freq: Every day | ORAL | 0 refills | Status: DC

## 2016-12-07 MED ORDER — HYDRALAZINE HCL 50 MG PO TABS: 50.0000 mg | ORAL_TABLET | Freq: Three times a day (TID) | ORAL | Status: DC

## 2016-12-07 NOTE — Progress Notes (Signed)
Pt being discharged home via wheelchair with family. Pt alert and oriented x4. VSS. Pt c/o no pain at this time. No signs of respiratory distress. Education complete and care plans resolved. IV removed with catheter intact and pt tolerated well. No further issues at this time. Pt to follow up with PCP. Ebb Carelock R, RN 

## 2016-12-07 NOTE — Discharge Summary (Signed)
Physician Discharge Summary  Kelly Ruiz TLX:[id number] DOB: 09-20-1923 DOA: 12/04/2016  PCP: Ronita Hipps, MD  Admit date: 12/04/2016 Discharge date: 12/07/2016  Admitted From: Home Disposition:  Home  Recommendations for Outpatient Follow-up:  1. Follow up with PCP in 1-2 weeks 2. Please obtain CBC in one week  Home Health: Yes Equipment/Devices: None  Discharge Condition: Improved  CODE STATUS: DO NOT RESUSCITATE Diet recommendation: Regular  Brief/Interim Summary: This is a 83 year old female with chronic diastolic CHF, CKD stage IV, IDDM who presents with symptomatic anemia.  Anemia The patient has a chronic anemia, baseline Hgb 9-10 g/dL, primarily normocytic.  She had gross rectal bleeding back in April, admitted to [facility].  At that time colonoscopy was deferred, presumed to be diverticular bleed.  Since then, has been following up with Dr. Melina Copa, GI in [place] who has noted persistent FOBT+, been taking iron supplements and B12 supplements.  Has had diarrhea (5-6 loose BM per day), which recently improved with Lomotil.   Recently, she has been very dyspneic with exertion.  She has had leg swelling.  She has been tired.  She has had no gross bleeding (hematuria, rectal, vaginal, nose) that she knows of.  She went to her GI Dr. Melina Copa, who found her Hgb 7.5 g/dL and FOBT+ so she was sent to the ER.   Admitted and transfused 2 units. Evaluated by Hematology, and noted to have no signs of hemolysis, normal EPO level actually.  MDS not suspected.  Iron stores were inappropriately low, and so it was suspected this represented a slow GI bleed, source unclear.  GI were consulted who offered EGD, which family and patient declined.  Aspirin was stopped, and plan was to follow up as outpatient with hope that there was a small GI bleed that was missed, rather than a chronic GI bleed that would require additional periodic transfusions.  CHF Anemia precipitated CHF.  She was given  IV lasix and diuresed.  At the time of discharge, she was restarted on home Bumex with instructions to add Zaroxolyn if needed and follow up with PCP.     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  Also call for blood in bowel movements or black and tarry bowel movements.   Diet - low sodium heart healthy   Complete by:  As directed    Discharge instructions   Complete by:  As directed    From Dr. Loleta Books: Follow up with your primary in the next 1-2 weeks for blood work (check hemoglobin levels and kidney function)  1. For the anemia, follow up with Dr. Helene Kelp or Dr. Melina Copa in the next 2-3 weeks for an office visit 2. Continue daily iron 3. Stop aspirin 4. Watch your bowel movements for signs of red blood or black/tarry stools, alert your primary care immediately if this happens  For your blood pressure and heart: Resume Bumex twice daily If you start to see leg swelling in the next week, you may start the low dose metolazone, but only do this if you see leg swelling or  notice weight gain (from excess fluid); typically, take the metolazone tablet 1/2 hr before your Bumex Follow up with Dr. Helene Kelp or Dr. [name] office as previously scheduled, sooner if you need to start metolazone  For your other blood pressure medicines, resume these as you had been taking them before the hospital   Increase activity slowly   Complete by:  As directed      Allergies as of 12/07/2016      Reactions   Actos [pioglitazone Hydrochloride]    Felodipine Er     Hctz [hydrochlorothiazide]    Lisinopril    Penicillins    Plendil [felodipine]    Amlodipine Besylate Other (See Comments), Hives   Brimonidine Tartrate Other (See Comments)   Eye redness and itchiness, blurred vision   Clonidine Derivatives Rash   Combigan [brimonidine Tartrate-timolol] Other (See Comments)   Eye redness and itchiness, blurred vision   Dorzolamide Other (See Comments)   Increased eye pressure, blurred vision   Monopril [fosinopril Sodium] Cough   Norvasc [amlodipine Besylate] Hives      Medication List    TAKE these medications   acetaminophen 500 MG tablet Commonly known as:  TYLENOL Take 500 mg by mouth 2 (two) times daily.   atorvastatin 10 MG tablet Commonly known as:  LIPITOR Take 10 mg by mouth at bedtime.   brinzolamide 1 % ophthalmic suspension Commonly known as:  AZOPT Place 1 drop into the right eye 2 (two) times daily.   bumetanide 1 MG tablet Commonly known as:  BUMEX Take 0.5 tablets by mouth 2 (two) times daily.   calcium-vitamin D 500-200 MG-UNIT tablet Take 1 tablet by mouth 2 (two) times daily with a meal.   carvedilol 12.5 MG tablet Commonly known as:  COREG Take 1 tablet (12.5 mg total) by mouth 2 (two) times daily with a meal.   carvedilol 6.25 MG tablet Commonly known as:  COREG Take 1 tablet (6.25 mg total) 2 (two) times daily by mouth. Take with 12.5 mg to make 18.75mg    cyanocobalamin 1000 MCG tablet Take 1,000 mcg by mouth daily.   dicyclomine 10 MG capsule Commonly known as:  BENTYL Take 10 mg by mouth 4 (four) times daily.   diphenoxylate-atropine 2.5-0.025 MG tablet Commonly known as:  LOMOTIL Take 1 tablet by mouth 3 (three) times daily.   EYE VITAMINS PO Take by mouth 2 (two) times daily.   ferrous sulfate 325 (65 FE) MG tablet Take 325 mg by mouth 2 (two) times daily with a meal.   Fish Oil 1000 MG Cpdr Take 1 capsule by mouth daily.   fluticasone 50 MCG/ACT nasal spray Commonly known as:   FLONASE Place 2 sprays into both nostrils 2 (two) times daily.   gabapentin 100 MG capsule Commonly known as:  NEURONTIN Take 1 capsule (100 mg total) by mouth at bedtime.   hydrALAZINE 50 MG tablet Commonly known as:  APRESOLINE Take 1 tablet (50 mg total) by mouth 3 (three) times daily. What changed:  when to take this   isosorbide mononitrate 120 MG 24 hr tablet Commonly known as:  IMDUR Take 2 tablets (240 mg total) by mouth daily.   LANTUS SOLOSTAR 100 UNIT/ML Solostar Pen Generic drug:  Insulin Glargine Inject 2 Units into the skin daily as needed. B.S over 200 Sliding scale   latanoprost 0.005 % ophthalmic solution Commonly known as:  XALATAN Place 1 drop into the right eye at bedtime.   losartan 50 MG tablet Commonly known as:  COZAAR Take 50 mg by mouth at bedtime.   metolazone 2.5 MG tablet Commonly known as:  ZAROXOLYN Take 1 tablet (2.5 mg total) by mouth daily.   montelukast 10 MG tablet Commonly known as:  SINGULAIR Take 10 mg by mouth at bedtime.   multivitamin tablet Take 1 tablet by mouth daily.   nitroGLYCERIN 0.4 MG SL tablet Commonly known as:  NITROSTAT Place 1 tablet (0.4 mg total) under the tongue every 5 (five) minutes as needed for chest pain.   omeprazole 20 MG capsule Commonly known as:  PRILOSEC Take 20 mg by mouth daily.   prednisoLONE acetate 1 % ophthalmic suspension Commonly known as:  PRED FORTE Place 1 drop into the left eye daily.   SALONPAS ARTHRITIS PAIN RELIEF EX Apply 1 patch topically every 12 (twelve) hours.   timolol 0.5 % ophthalmic solution Commonly known as:  TIMOPTIC Place 1 drop into the right eye 2 (two) times daily.   traMADol 50 MG tablet Commonly known as:  ULTRAM Take 1 tablet (50 mg total) at bedtime as needed by mouth.   traZODone 50 MG tablet Commonly known as:  DESYREL Take 75 mg by mouth at bedtime.   vitamin C 500 MG tablet Commonly known as:  ASCORBIC ACID Take 500 mg by mouth 2 (two) times  daily.      Follow-up Information    Ronita Hipps, MD.   Specialty:  Family Medicine Contact information: [place] [phone]          Allergies  Allergen Reactions  . Actos [Pioglitazone Hydrochloride]   . Felodipine Er   . Hctz [Hydrochlorothiazide]   . Lisinopril   . Penicillins   . Plendil [Felodipine]   . Amlodipine Besylate Other (See Comments) and Hives  . Brimonidine Tartrate Other (See Comments)    Eye redness and itchiness, blurred vision  . Clonidine Derivatives Rash  . Combigan [Brimonidine Tartrate-Timolol] Other (See Comments)    Eye redness and itchiness, blurred vision  . Dorzolamide Other (See Comments)    Increased eye pressure, blurred vision  . Monopril [Fosinopril Sodium] Cough  . Norvasc [Amlodipine Besylate] Hives    Consultations:  GI, Heme   Procedures/Studies: Dg Inject Diag/thera/inc Needle/cath/plc Epi/lumb/sac W/img  Result Date: 11/12/2016 CLINICAL DATA:  Spondylosis without myelopathy. Back pain. Bilateral leg pain, presently worse on the right. FLUOROSCOPY TIME:  0 minutes 18 seconds. 28.26 micro gray meter squared PROCEDURE: The procedure, risks, benefits, and alternatives were explained to the patient. Questions regarding the procedure were encouraged and answered. The patient understands and consents to the procedure. LUMBAR EPIDURAL INJECTION: An interlaminar approach was performed on right at L4-5. The overlying skin was cleansed and anesthetized. A 20 gauge epidural needle was advanced using loss-of-resistance technique. DIAGNOSTIC EPIDURAL INJECTION: Injection of Isovue-M 200 shows a good epidural pattern with spread above and below the level of needle placement, primarily on the right. No vascular opacification is seen. THERAPEUTIC EPIDURAL INJECTION: One hundred twenty Mg of Depo-Medrol mixed with 3 cc 1% lidocaine were instilled. The procedure was well-tolerated, and the patient was discharged  thirty minutes following the injection in good condition. COMPLICATIONS: None IMPRESSION: Technically successful epidural injection on the right at L4-5 # 1. Electronically Signed   By: Nelson Chimes M.D.   On: 11/12/2016 15:07       Subjective: Feels well.  Good appetite.  No melena, hematochezia, weakness. No chest pain, leg swelling dyspnea.  Discharge Exam: Vitals:   12/07/16  0446 12/07/16 0630  BP: (!) 174/54 (!) 150/52  Pulse: 66 66  Resp: 18   Temp: 99 F (37.2 C)   SpO2: 99%    Vitals:   12/06/16 2117 12/07/16 0219 12/07/16 0446 12/07/16 0630  BP: (!) 170/53 (!) 124/34 (!) 174/54 (!) 150/52  Pulse:   66 66  Resp:   18   Temp:   99 F (37.2 C)   TempSrc:   Oral   SpO2:   99%   Weight:   66.7 kg (147 lb 0.8 oz)   Height:        General: Pt is alert, awake, not in acute distress Cardiovascular: RRR, S1/S2 +, no rubs, no gallops Respiratory: CTA bilaterally, no wheezing, no rhonchi Abdominal: Soft, NT, ND, bowel sounds + Extremities: no edema, no cyanosis    The results of significant diagnostics from this hospitalization (including imaging, microbiology, ancillary and laboratory) are listed below for reference.     Microbiology: No results found for this or any previous visit (from the past 240 hour(s)).   Labs: BNP (last 3 results) No results for input(s): BNP in the last 8760 hours. Basic Metabolic Panel: Recent Labs  Lab 12/04/16 1730 12/05/16 0154 12/06/16 0340 12/07/16 0435  NA 136 137 138 139  K 5.4* 4.9 4.0 4.0  CL 111 112* 109 108  CO2 18* 19* 22 23  GLUCOSE 128* 107* 116* 97  BUN 70* 68* 61* 53*  CREATININE 1.89* 1.80* 1.74* 1.63*  CALCIUM 8.8* 8.6* 8.4* 8.1*   Liver Function Tests: Recent Labs  Lab 12/04/16 1730 12/05/16 0154  AST 18 17  ALT 29 26  ALKPHOS 82 68  BILITOT 0.3 0.6  PROT 4.7* 4.3*  ALBUMIN 2.5* 2.3*   No results for input(s): LIPASE, AMYLASE in the last 168 hours. No results for input(s): AMMONIA in the last 168  hours. CBC: Recent Labs  Lab 12/04/16 1730 12/05/16 0154 12/05/16 1527 12/06/16 0340 12/07/16 0435  WBC 7.6 6.3  --  6.4 6.3  HGB 7.6* 7.9* 9.1* 8.3* 9.5*  HCT 24.4* 24.8* 28.3* 25.6* 29.1*  MCV 103.0* 98.4  --  99.6 97.0  PLT 150 131*  --  135* 129*   Cardiac Enzymes: No results for input(s): CKTOTAL, CKMB, CKMBINDEX, TROPONINI in the last 168 hours. BNP: Invalid input(s): POCBNP CBG: Recent Labs  Lab 12/05/16 0743  GLUCAP 107*   D-Dimer No results for input(s): DDIMER in the last 72 hours. Hgb A1c No results for input(s): HGBA1C in the last 72 hours. Lipid Profile No results for input(s): CHOL, HDL, LDLCALC, TRIG, CHOLHDL, LDLDIRECT in the last 72 hours. Thyroid function studies Recent Labs    12/05/16 1527  TSH 1.100   Anemia work up No results for input(s): VITAMINB12, FOLATE, FERRITIN, TIBC, IRON, RETICCTPCT in the last 72 hours. Urinalysis No results found for: COLORURINE, APPEARANCEUR, LABSPEC, [place], GLUCOSEU, HGBUR, BILIRUBINUR, KETONESUR, PROTEINUR, UROBILINOGEN, NITRITE, LEUKOCYTESUR Sepsis Labs Invalid input(s): PROCALCITONIN,  WBC,  LACTICIDVEN Microbiology No results found for this or any previous visit (from the past 240 hour(s)).   Time coordinating discharge: Over 30 minutes  SIGNED:   Edwin Dada, MD  [facility] 12/07/2016, 7:08 PM   If 7PM-7AM, please contact night-coverage [email or web address] Password [email or web address]

## 2016-12-07 NOTE — Consult Note (Signed)
[facility] CM Inpatient Consult   12/07/2016  Kelly Ruiz 09/13/1923 [id number]   Received a verbal referral from patient's nurse regarding follow up needs at home.  Patient is in the Medicare ACO. The patient admitted with Anemia with a HX of Chronic HF and Chronic renal failure.    Met with the patient and her son, [name], who states that the physical therapist were recommending home health care.  They have used [facility] in the past.  Notified patient's nurse of Physical Therapist recommendation for home health.   The patient has her equipment at home.  Explained to the patient and son that [facility] Care Management is not home health but does not interfere with any needs the patient has.  Explained the role of care management in post hospital follow up.    The patient endorses Dr. Lynley Holt as the primary care provider at [facility] in [place].  Patient and son accepted the brochure, 24 hour nurse advise line magnet information.  Patient will receive post hospital EMMI follow up.  Patient uses [facility] for pharmacy.  [name] receives her calls at [phone].  Patient is HOH.  For questions, please contact:   , RN BSN CCM [facility] Hospital Liaison  [phone] business mobile phone Toll free office [phone]    

## 2016-12-07 NOTE — Care Management Note (Signed)
Case Management Note  Patient Details  Name: Kelly Ruiz MRN: [id number] Date of Birth: December 11, 1923  Subjective/Objective:                    Action/Plan: Pt was active with [facility] prior to admission. She would like to continue with them. CM spoke to [facility] and informed them of d/c. CM also faxed them the needed information.  Patients son to provide transportation home.   Expected Discharge Date:  12/07/16               Expected Discharge Plan:  [place]  In-House Referral:     Discharge planning Services  CM Consult  Post Acute Care Choice:  Home Health Choice offered to:  Patient, Adult Children  DME Arranged:    DME Agency:     HH Arranged:  PT, RN [place] Agency:  [facility]  Status of Service:  Completed, signed off  If discussed at [place] of Stay Meetings, dates discussed:    Additional Comments:  Pollie Friar, RN 12/07/2016, 12:45 PM

## 2016-12-07 NOTE — Progress Notes (Signed)
BP elevated for most of the night. No prn to give. Paged MD on call for PRN.   Will continue to monitor.  Zalea Pete, RN

## 2016-12-10 DIAGNOSIS — K219 Gastro-esophageal reflux disease without esophagitis: Secondary | ICD-10-CM | POA: Diagnosis not present

## 2016-12-10 DIAGNOSIS — I130 Hypertensive heart and chronic kidney disease with heart failure and stage 1 through stage 4 chronic kidney disease, or unspecified chronic kidney disease: Secondary | ICD-10-CM | POA: Diagnosis not present

## 2016-12-10 DIAGNOSIS — F0390 Unspecified dementia without behavioral disturbance: Secondary | ICD-10-CM | POA: Diagnosis not present

## 2016-12-10 DIAGNOSIS — I2510 Atherosclerotic heart disease of native coronary artery without angina pectoris: Secondary | ICD-10-CM | POA: Diagnosis not present

## 2016-12-10 DIAGNOSIS — H409 Unspecified glaucoma: Secondary | ICD-10-CM | POA: Diagnosis not present

## 2016-12-10 DIAGNOSIS — E1122 Type 2 diabetes mellitus with diabetic chronic kidney disease: Secondary | ICD-10-CM | POA: Diagnosis not present

## 2016-12-12 ENCOUNTER — Other Ambulatory Visit: Payer: Self-pay

## 2016-12-12 NOTE — Patient Outreach (Signed)
[facility]) Care Management  12/12/2016  Kelita Wallis [birth year] [id number]   EMMI: General Discharge Referral date: 12/12/16 Referral source: EMMI general discharge red alert Referral reason: Scheduled follow up: no Day # 1  Telephone call to patient's son and primary caregiver, Aldine Chakraborty regarding EMMI general discharge red alert.  Designated party release in patients medical record.  Discussed EMMI general discharge with son. Son states he will schedule patients follow up appointment with her primary MD. Pandora Leiter states patient has been in the hospital 3 times this years.  States this most recent hospitalization was due to anemia and increase fluid.   Son states patient has caregivers at home and has home health services. Son states patient has her medications and takes as prescribed. RNCM offered [facility] care management services. RNCM explained to son difference between [facility] care management services and home health services.  Son refuses services for patient at this time. Son states he is managing patients care at this time and patient is being followed closely by her doctors. Son states patient sees her primary MD, cardiologist, and gastroenterologist.   Son verbally agreed to receive [facility] care management brochure for future reference. Son verbally agreed to ongoing EMMI general discharge calls for patient.   PLAN:  RNCM will refer patient to care management assistant to close due to caregiver refusal of services.  RNCM will send patient [facility] care management outreach letter, brochure, and 24 hour nurse call line magnet as discussed.   Quinn Plowman RN,BSN,CCM [facility] Telephonic  [phone]

## 2016-12-14 DIAGNOSIS — I2510 Atherosclerotic heart disease of native coronary artery without angina pectoris: Secondary | ICD-10-CM | POA: Diagnosis not present

## 2016-12-14 DIAGNOSIS — E1122 Type 2 diabetes mellitus with diabetic chronic kidney disease: Secondary | ICD-10-CM | POA: Diagnosis not present

## 2016-12-14 DIAGNOSIS — K219 Gastro-esophageal reflux disease without esophagitis: Secondary | ICD-10-CM | POA: Diagnosis not present

## 2016-12-14 DIAGNOSIS — F0390 Unspecified dementia without behavioral disturbance: Secondary | ICD-10-CM | POA: Diagnosis not present

## 2016-12-14 DIAGNOSIS — H409 Unspecified glaucoma: Secondary | ICD-10-CM | POA: Diagnosis not present

## 2016-12-14 DIAGNOSIS — I130 Hypertensive heart and chronic kidney disease with heart failure and stage 1 through stage 4 chronic kidney disease, or unspecified chronic kidney disease: Secondary | ICD-10-CM | POA: Diagnosis not present

## 2016-12-17 DIAGNOSIS — I130 Hypertensive heart and chronic kidney disease with heart failure and stage 1 through stage 4 chronic kidney disease, or unspecified chronic kidney disease: Secondary | ICD-10-CM | POA: Diagnosis not present

## 2016-12-17 DIAGNOSIS — F0390 Unspecified dementia without behavioral disturbance: Secondary | ICD-10-CM | POA: Diagnosis not present

## 2016-12-17 DIAGNOSIS — I2510 Atherosclerotic heart disease of native coronary artery without angina pectoris: Secondary | ICD-10-CM | POA: Diagnosis not present

## 2016-12-17 DIAGNOSIS — K219 Gastro-esophageal reflux disease without esophagitis: Secondary | ICD-10-CM | POA: Diagnosis not present

## 2016-12-17 DIAGNOSIS — E1122 Type 2 diabetes mellitus with diabetic chronic kidney disease: Secondary | ICD-10-CM | POA: Diagnosis not present

## 2016-12-17 DIAGNOSIS — H409 Unspecified glaucoma: Secondary | ICD-10-CM | POA: Diagnosis not present

## 2016-12-19 DIAGNOSIS — Z6828 Body mass index (BMI) 28.0-28.9, adult: Secondary | ICD-10-CM | POA: Diagnosis not present

## 2016-12-19 DIAGNOSIS — E119 Type 2 diabetes mellitus without complications: Secondary | ICD-10-CM | POA: Diagnosis not present

## 2016-12-19 DIAGNOSIS — I509 Heart failure, unspecified: Secondary | ICD-10-CM | POA: Diagnosis not present

## 2016-12-21 DIAGNOSIS — I130 Hypertensive heart and chronic kidney disease with heart failure and stage 1 through stage 4 chronic kidney disease, or unspecified chronic kidney disease: Secondary | ICD-10-CM | POA: Diagnosis not present

## 2016-12-21 DIAGNOSIS — I2510 Atherosclerotic heart disease of native coronary artery without angina pectoris: Secondary | ICD-10-CM | POA: Diagnosis not present

## 2016-12-21 DIAGNOSIS — F0390 Unspecified dementia without behavioral disturbance: Secondary | ICD-10-CM | POA: Diagnosis not present

## 2016-12-21 DIAGNOSIS — H409 Unspecified glaucoma: Secondary | ICD-10-CM | POA: Diagnosis not present

## 2016-12-21 DIAGNOSIS — E1122 Type 2 diabetes mellitus with diabetic chronic kidney disease: Secondary | ICD-10-CM | POA: Diagnosis not present

## 2016-12-21 DIAGNOSIS — K219 Gastro-esophageal reflux disease without esophagitis: Secondary | ICD-10-CM | POA: Diagnosis not present

## 2016-12-25 DIAGNOSIS — I130 Hypertensive heart and chronic kidney disease with heart failure and stage 1 through stage 4 chronic kidney disease, or unspecified chronic kidney disease: Secondary | ICD-10-CM | POA: Diagnosis not present

## 2016-12-25 DIAGNOSIS — F0390 Unspecified dementia without behavioral disturbance: Secondary | ICD-10-CM | POA: Diagnosis not present

## 2016-12-25 DIAGNOSIS — I2510 Atherosclerotic heart disease of native coronary artery without angina pectoris: Secondary | ICD-10-CM | POA: Diagnosis not present

## 2016-12-25 DIAGNOSIS — E1122 Type 2 diabetes mellitus with diabetic chronic kidney disease: Secondary | ICD-10-CM | POA: Diagnosis not present

## 2016-12-25 DIAGNOSIS — H409 Unspecified glaucoma: Secondary | ICD-10-CM | POA: Diagnosis not present

## 2016-12-25 DIAGNOSIS — K219 Gastro-esophageal reflux disease without esophagitis: Secondary | ICD-10-CM | POA: Diagnosis not present

## 2016-12-26 DIAGNOSIS — I130 Hypertensive heart and chronic kidney disease with heart failure and stage 1 through stage 4 chronic kidney disease, or unspecified chronic kidney disease: Secondary | ICD-10-CM | POA: Diagnosis not present

## 2016-12-26 DIAGNOSIS — N184 Chronic kidney disease, stage 4 (severe): Secondary | ICD-10-CM | POA: Diagnosis not present

## 2016-12-26 DIAGNOSIS — D62 Acute posthemorrhagic anemia: Secondary | ICD-10-CM | POA: Diagnosis not present

## 2016-12-26 DIAGNOSIS — I5033 Acute on chronic diastolic (congestive) heart failure: Secondary | ICD-10-CM | POA: Diagnosis not present

## 2016-12-26 DIAGNOSIS — K922 Gastrointestinal hemorrhage, unspecified: Secondary | ICD-10-CM | POA: Diagnosis not present

## 2016-12-26 DIAGNOSIS — K219 Gastro-esophageal reflux disease without esophagitis: Secondary | ICD-10-CM | POA: Diagnosis not present

## 2016-12-26 DIAGNOSIS — Z9181 History of falling: Secondary | ICD-10-CM | POA: Diagnosis not present

## 2016-12-26 DIAGNOSIS — F0390 Unspecified dementia without behavioral disturbance: Secondary | ICD-10-CM | POA: Diagnosis not present

## 2016-12-26 DIAGNOSIS — E1122 Type 2 diabetes mellitus with diabetic chronic kidney disease: Secondary | ICD-10-CM | POA: Diagnosis not present

## 2016-12-26 DIAGNOSIS — E43 Unspecified severe protein-calorie malnutrition: Secondary | ICD-10-CM | POA: Diagnosis not present

## 2016-12-26 DIAGNOSIS — I2510 Atherosclerotic heart disease of native coronary artery without angina pectoris: Secondary | ICD-10-CM | POA: Diagnosis not present

## 2016-12-26 DIAGNOSIS — H409 Unspecified glaucoma: Secondary | ICD-10-CM | POA: Diagnosis not present

## 2016-12-28 DIAGNOSIS — E1122 Type 2 diabetes mellitus with diabetic chronic kidney disease: Secondary | ICD-10-CM | POA: Diagnosis not present

## 2016-12-28 DIAGNOSIS — I130 Hypertensive heart and chronic kidney disease with heart failure and stage 1 through stage 4 chronic kidney disease, or unspecified chronic kidney disease: Secondary | ICD-10-CM | POA: Diagnosis not present

## 2016-12-28 DIAGNOSIS — I5033 Acute on chronic diastolic (congestive) heart failure: Secondary | ICD-10-CM | POA: Diagnosis not present

## 2016-12-28 DIAGNOSIS — N184 Chronic kidney disease, stage 4 (severe): Secondary | ICD-10-CM | POA: Diagnosis not present

## 2016-12-28 DIAGNOSIS — K922 Gastrointestinal hemorrhage, unspecified: Secondary | ICD-10-CM | POA: Diagnosis not present

## 2016-12-28 DIAGNOSIS — D62 Acute posthemorrhagic anemia: Secondary | ICD-10-CM | POA: Diagnosis not present

## 2016-12-29 NOTE — Progress Notes (Deleted)
[name] [address] [place], [place] [address] Phone: [phone] Subjective:    I'm seeing this patient by the request  of:    CC: Back pain follow-up  [id number]  Kelly Ruiz is a 83 y.o. female coming in with complaint of back pain.  Severe degenerative disc disease of the lumbar spine.  Patient unfortunately has had difficulty with her chronic congestive heart failure as well as an acute kidney injury.  Patient was in the emergency room November 20 for symptomatic anemia.  Patient states      Past Medical History:  Diagnosis Date  . CHF (congestive heart failure) (HCC)    Diastolic. Precipitated by Actos  . Chronic kidney disease    mild-moderate. GFR was 35 in 02/12  . Coronary artery disease 02/2010   abnormal nuclear stress test with mild ischemia in apical anterolateral wall.   . DM (diabetes mellitus) ([place])   . Dyslipidemia   . Hypertension   . Nasal polyps   . Unspecified hypertensive heart disease without heart failure    Past Surgical History:  Procedure Laterality Date  . APPENDECTOMY  1951  . CATARACT EXTRACTION, BILATERAL    . CHOLECYSTECTOMY    . GLAUCOMA SURGERY  10/29/2013  . hysterectomy - unknown type    . NECK MASS EXCISION     non cancerous  . TONSILLECTOMY     Social History   Socioeconomic History  . Marital status: Married    Spouse name: Not on file  . Number of children: 3  . Years of education: Not on file  . Highest education level: Not on file  Social Needs  . Financial resource strain: Not on file  . Food insecurity - worry: Not on file  . Food insecurity - inability: Not on file  . Transportation needs - medical: Not on file  . Transportation needs - non-medical: Not on file  Occupational History  . Occupation: retired  Tobacco Use  . Smoking status: Never Smoker  . Smokeless tobacco: Never Used  Substance and Sexual Activity  . Alcohol use: No    Alcohol/week: 0.0 oz  . Drug use:  No  . Sexual activity: Not on file  Other Topics Concern  . Not on file  Social History Narrative  . Not on file   Allergies  Allergen Reactions  . Actos [Pioglitazone Hydrochloride]   . Felodipine Er   . Hctz [Hydrochlorothiazide]   . Lisinopril   . Penicillins   . Plendil [Felodipine]   . Amlodipine Besylate Other (See Comments) and Hives  . Brimonidine Tartrate Other (See Comments)    Eye redness and itchiness, blurred vision  . Clonidine Derivatives Rash  . Combigan [Brimonidine Tartrate-Timolol] Other (See Comments)    Eye redness and itchiness, blurred vision  . Dorzolamide Other (See Comments)    Increased eye pressure, blurred vision  . Monopril [Fosinopril Sodium] Cough  . Norvasc [Amlodipine Besylate] Hives   Family History  Problem Relation Age of Onset  . Diabetes Unknown   . Hypertension Unknown   . Stroke Unknown   . Asthma Mother   . Allergic rhinitis Mother   . Congestive Heart Failure Mother   . Stroke Mother      Past medical history, social, surgical and family history all reviewed in electronic medical record.  No pertanent information unless stated regarding to the chief complaint.   Review of Systems:Review of systems updated and as accurate as of 12/29/16  No headache, visual changes, nausea, vomiting, diarrhea, constipation, dizziness, abdominal pain, skin rash, fevers, chills, night sweats, weight loss, swollen lymph nodes, body aches, joint swelling, muscle aches, chest pain, shortness of breath, mood changes.   Objective  There were no vitals taken for this visit. Systems examined below as of 12/29/16   General: No apparent distress alert and oriented x3 mood and affect normal, dressed appropriately.  HEENT: Pupils equal, extraocular movements intact  Respiratory: Patient's speak in full sentences and does not appear short of breath  Cardiovascular: No lower extremity edema, non tender, no erythema  Skin: Warm dry intact with no signs of  infection or rash on extremities or on axial skeleton.  Abdomen: Soft nontender  Neuro: Cranial nerves II through XII are intact, neurovascularly intact in all extremities with 2+ DTRs and 2+ pulses.  Lymph: No lymphadenopathy of posterior or anterior cervical chain or axillae bilaterally.  Gait normal with good balance and coordination.  MSK:  Non tender with full range of motion and good stability and symmetric strength and tone of shoulders, elbows, wrist, hip, knee and ankles bilaterally.     Impression and Recommendations:     This case required medical decision making of moderate complexity.      Note: This dictation was prepared with Dragon dictation along with smaller phrase technology. Any transcriptional errors that result from this process are unintentional.

## 2016-12-31 ENCOUNTER — Ambulatory Visit: Payer: Medicare Other | Admitting: Family Medicine

## 2016-12-31 ENCOUNTER — Ambulatory Visit: Payer: Medicare Other | Admitting: Physician Assistant

## 2017-01-01 DIAGNOSIS — I509 Heart failure, unspecified: Secondary | ICD-10-CM | POA: Diagnosis not present

## 2017-01-01 DIAGNOSIS — Z6827 Body mass index (BMI) 27.0-27.9, adult: Secondary | ICD-10-CM | POA: Diagnosis not present

## 2017-01-01 DIAGNOSIS — N184 Chronic kidney disease, stage 4 (severe): Secondary | ICD-10-CM | POA: Diagnosis not present

## 2017-01-01 DIAGNOSIS — K589 Irritable bowel syndrome without diarrhea: Secondary | ICD-10-CM | POA: Diagnosis not present

## 2017-01-02 DIAGNOSIS — K922 Gastrointestinal hemorrhage, unspecified: Secondary | ICD-10-CM | POA: Diagnosis not present

## 2017-01-02 DIAGNOSIS — D62 Acute posthemorrhagic anemia: Secondary | ICD-10-CM | POA: Diagnosis not present

## 2017-01-02 DIAGNOSIS — I130 Hypertensive heart and chronic kidney disease with heart failure and stage 1 through stage 4 chronic kidney disease, or unspecified chronic kidney disease: Secondary | ICD-10-CM | POA: Diagnosis not present

## 2017-01-02 DIAGNOSIS — I5033 Acute on chronic diastolic (congestive) heart failure: Secondary | ICD-10-CM | POA: Diagnosis not present

## 2017-01-02 DIAGNOSIS — N184 Chronic kidney disease, stage 4 (severe): Secondary | ICD-10-CM | POA: Diagnosis not present

## 2017-01-02 DIAGNOSIS — E1122 Type 2 diabetes mellitus with diabetic chronic kidney disease: Secondary | ICD-10-CM | POA: Diagnosis not present

## 2017-01-04 DIAGNOSIS — E1122 Type 2 diabetes mellitus with diabetic chronic kidney disease: Secondary | ICD-10-CM | POA: Diagnosis not present

## 2017-01-04 DIAGNOSIS — N184 Chronic kidney disease, stage 4 (severe): Secondary | ICD-10-CM | POA: Diagnosis not present

## 2017-01-04 DIAGNOSIS — I5033 Acute on chronic diastolic (congestive) heart failure: Secondary | ICD-10-CM | POA: Diagnosis not present

## 2017-01-04 DIAGNOSIS — D62 Acute posthemorrhagic anemia: Secondary | ICD-10-CM | POA: Diagnosis not present

## 2017-01-04 DIAGNOSIS — I130 Hypertensive heart and chronic kidney disease with heart failure and stage 1 through stage 4 chronic kidney disease, or unspecified chronic kidney disease: Secondary | ICD-10-CM | POA: Diagnosis not present

## 2017-01-04 DIAGNOSIS — K922 Gastrointestinal hemorrhage, unspecified: Secondary | ICD-10-CM | POA: Diagnosis not present

## 2017-01-12 DIAGNOSIS — E1122 Type 2 diabetes mellitus with diabetic chronic kidney disease: Secondary | ICD-10-CM | POA: Diagnosis not present

## 2017-01-12 DIAGNOSIS — I5033 Acute on chronic diastolic (congestive) heart failure: Secondary | ICD-10-CM | POA: Diagnosis not present

## 2017-01-12 DIAGNOSIS — D62 Acute posthemorrhagic anemia: Secondary | ICD-10-CM | POA: Diagnosis not present

## 2017-01-12 DIAGNOSIS — K922 Gastrointestinal hemorrhage, unspecified: Secondary | ICD-10-CM | POA: Diagnosis not present

## 2017-01-12 DIAGNOSIS — I130 Hypertensive heart and chronic kidney disease with heart failure and stage 1 through stage 4 chronic kidney disease, or unspecified chronic kidney disease: Secondary | ICD-10-CM | POA: Diagnosis not present

## 2017-01-12 DIAGNOSIS — N184 Chronic kidney disease, stage 4 (severe): Secondary | ICD-10-CM | POA: Diagnosis not present

## 2017-01-14 DIAGNOSIS — D62 Acute posthemorrhagic anemia: Secondary | ICD-10-CM | POA: Diagnosis not present

## 2017-01-19 DIAGNOSIS — I5033 Acute on chronic diastolic (congestive) heart failure: Secondary | ICD-10-CM | POA: Diagnosis not present

## 2017-01-19 DIAGNOSIS — E1122 Type 2 diabetes mellitus with diabetic chronic kidney disease: Secondary | ICD-10-CM | POA: Diagnosis not present

## 2017-01-19 DIAGNOSIS — D62 Acute posthemorrhagic anemia: Secondary | ICD-10-CM | POA: Diagnosis not present

## 2017-01-19 DIAGNOSIS — N184 Chronic kidney disease, stage 4 (severe): Secondary | ICD-10-CM | POA: Diagnosis not present

## 2017-01-19 DIAGNOSIS — I130 Hypertensive heart and chronic kidney disease with heart failure and stage 1 through stage 4 chronic kidney disease, or unspecified chronic kidney disease: Secondary | ICD-10-CM | POA: Diagnosis not present

## 2017-01-19 DIAGNOSIS — K922 Gastrointestinal hemorrhage, unspecified: Secondary | ICD-10-CM | POA: Diagnosis not present

## 2017-02-05 DIAGNOSIS — Z6827 Body mass index (BMI) 27.0-27.9, adult: Secondary | ICD-10-CM | POA: Diagnosis not present

## 2017-02-05 DIAGNOSIS — L729 Follicular cyst of the skin and subcutaneous tissue, unspecified: Secondary | ICD-10-CM | POA: Diagnosis not present

## 2017-02-05 DIAGNOSIS — Z79899 Other long term (current) drug therapy: Secondary | ICD-10-CM | POA: Diagnosis not present

## 2017-02-05 DIAGNOSIS — N184 Chronic kidney disease, stage 4 (severe): Secondary | ICD-10-CM | POA: Diagnosis not present

## 2017-02-05 DIAGNOSIS — I10 Essential (primary) hypertension: Secondary | ICD-10-CM | POA: Diagnosis not present

## 2017-02-09 NOTE — Progress Notes (Signed)
Cardiology Office Note   Date:  02/11/2017   ID:  Bonnetta Allbee, DOB 1923/02/09, MRN [id number]  PCP:  Ronita Hipps, MD  Cardiologist:   Minus Breeding, MD    Chief Complaint  Patient presents with  . Congestive Heart Failure      History of Present Illness: Kelly Ruiz is a 84 y.o. female who presents for follow up of chronic diastolic dysfunction.   She had a remote abnormal Myoview but her most recent Myoview Jan 2016 was normal.   Echo last year demonstrated NL EF with evidence of moderate LVH.  Since I saw her she has had her diuretics held by Ronita Hipps, MD secondary to possible dehydration.  She has been intolerant of multiple medications.    Since I last saw her she had her diuretic held for 30 days because of worsening renal function and then started back at a lower dose.  This was done by her nephrologist.  Her son reports that she was in the hospital at [facility] in September with heart failure.  She has also been in the hospital at [facility] since then in November with anemia.  She was found to have bleeding without a source.  Aspirin was stopped. I reviewed these records for this visit.    She is on relatively well since then.  I have some labs that were drawn last week and her creatinine is 2 which is above her baseline which is about 1.8.  Her BUN is 88.  She has not had any new shortness of breath, PND or orthopnea.  She is not had any new chest pressure, neck or arm discomfort.  She is not describing any palpitations, presyncope or syncope.   Past Medical History:  Diagnosis Date  . CHF (congestive heart failure) (HCC)    Diastolic. Precipitated by Actos  . Chronic kidney disease    mild-moderate. GFR was 35 in 02/12  . Coronary artery disease 02/2010   abnormal nuclear stress test with mild ischemia in apical anterolateral wall.   . DM (diabetes mellitus) ([place])   . Dyslipidemia   . Hypertension   . Nasal polyps   . Unspecified hypertensive heart disease  without heart failure     Past Surgical History:  Procedure Laterality Date  . APPENDECTOMY  1951  . CATARACT EXTRACTION, BILATERAL    . CHOLECYSTECTOMY    . GLAUCOMA SURGERY  10/29/2013  . hysterectomy - unknown type    . NECK MASS EXCISION     non cancerous  . TONSILLECTOMY       Current Outpatient Medications  Medication Sig Dispense Refill  . acetaminophen (TYLENOL) 500 MG tablet Take 1,000 mg by mouth 2 (two) times daily.     . brinzolamide (AZOPT) 1 % ophthalmic suspension Place 1 drop into the right eye 2 (two) times daily.     . bumetanide (BUMEX) 1 MG tablet Take 1 tablet (1 mg total) by mouth every morning and take 0.5 mg tablet (0.5 mg total) by mouth every evening.    . carvedilol (COREG) 12.5 MG tablet Take 1 tablet (12.5 mg total) by mouth 2 (two) times daily with a meal. 60 tablet 6  . carvedilol (COREG) 6.25 MG tablet Take 1 tablet (6.25 mg total) 2 (two) times daily by mouth. Take with 12.5 mg to make 18.75mg  180 tablet 3  . cyanocobalamin 1000 MCG tablet Take 1,000 mcg by mouth daily.     [name] dicyclomine (BENTYL) 10 MG capsule  Take 10 mg by mouth 3 (three) times daily before meals.     . diphenoxylate-atropine (LOMOTIL) 2.5-0.025 MG tablet Take 1 tablet by mouth 3 (three) times daily.    . ferrous sulfate 325 (65 FE) MG tablet Take 325 mg by mouth 2 (two) times daily with a meal.     . fluticasone (FLONASE) 50 MCG/ACT nasal spray Place 2 sprays into both nostrils 2 (two) times daily.    . hydrALAZINE (APRESOLINE) 50 MG tablet Take 1 tablet (50 mg total) by mouth 3 (three) times daily.    . Insulin Glargine (LANTUS SOLOSTAR) 100 UNIT/ML Solostar Pen Inject 2 Units into the skin daily as needed. B.S over 200 Sliding scale    . isosorbide mononitrate (IMDUR) 120 MG 24 hr tablet Take 2 tablets (240 mg total) by mouth daily. 180 tablet 3  . latanoprost (XALATAN) 0.005 % ophthalmic solution Place 1 drop into the right eye at bedtime.     . Liniments (SALONPAS ARTHRITIS PAIN  RELIEF EX) Apply 1 patch topically every 12 (twelve) hours.    [name] losartan (COZAAR) 50 MG tablet Take 50 mg by mouth at bedtime.     . Multiple Vitamins-Minerals (EYE VITAMINS PO) Take by mouth 2 (two) times daily.    . nitroGLYCERIN (NITROSTAT) 0.4 MG SL tablet Place 1 tablet (0.4 mg total) under the tongue every 5 (five) minutes as needed for chest pain. 25 tablet 3  . omeprazole (PRILOSEC) 20 MG capsule Take 20 mg by mouth daily.    . timolol (TIMOPTIC) 0.5 % ophthalmic solution Place 1 drop into the right eye 2 (two) times daily.     . traMADol (ULTRAM) 50 MG tablet Take 1 tablet (50 mg total) at bedtime as needed by mouth. 30 tablet 0  . traZODone (DESYREL) 50 MG tablet Take 75 mg by mouth at bedtime.    . vitamin C (ASCORBIC ACID) 500 MG tablet Take 500 mg by mouth 2 (two) times daily.      No current facility-administered medications for this visit.     Allergies:   Actos [pioglitazone hydrochloride]; Felodipine er; Hctz [hydrochlorothiazide]; Lisinopril; Penicillins; Plendil [felodipine]; Amlodipine besylate; Brimonidine tartrate; Clonidine derivatives; Combigan [brimonidine tartrate-timolol]; Dorzolamide; Monopril [fosinopril sodium]; and Norvasc [amlodipine besylate]    ROS:  Please see the history of present illness.   Otherwise, review of systems are positive for none.   All other systems are reviewed and negative.    PHYSICAL EXAM: VS:  BP (!) 162/60   Pulse 63   Ht 5' (1.524 m)   Wt 139 lb 9.6 oz (63.3 kg)   BMI 27.26 kg/m  , BMI Body mass index is 27.26 kg/m.  GENERAL: Somewhat frail   NECK:  No jugular venous distention, waveform within normal limits, carotid upstroke brisk and symmetric, no bruits, no thyromegaly LUNGS:  Clear to auscultation bilaterally CHEST:  Unremarkable HEART:  PMI not displaced or sustained,S1 and S2 within normal limits, no S3, no S4, no clicks, no rubs, no murmurs ABD:  Flat, positive bowel sounds normal in frequency in pitch, no bruits, no  rebound, no guarding, no midline pulsatile mass, no hepatomegaly, no splenomegaly EXT:  2 plus pulses throughout, trace edema, no cyanosis no clubbing   EKG:  EKG is sinus rhythm, ordered today. Rate 63, left bundle branch block, left axis deviation   Recent Labs: 12/05/2016: ALT 26; TSH 1.100 12/07/2016: BUN 53; Creatinine, Ser 1.63; Hemoglobin 9.5; Platelets 129; Potassium 4.0; Sodium 139    Lipid  Panel No results found for: CHOL, TRIG, HDL, CHOLHDL, VLDL, LDLCALC, LDLDIRECT    Wt Readings from Last 3 Encounters:  02/11/17 139 lb 9.6 oz (63.3 kg)  12/07/16 147 lb 0.8 oz (66.7 kg)  11/26/16 146 lb 12.8 oz (66.6 kg)      Other studies Reviewed: Additional studies/ records that were reviewed today include: None. Review of the above records demonstrates:     ASSESSMENT AND PLAN:  CHF (congestive heart failure) - I had a long discussion with her son about volume management and at this point I think she needs to be on the current dose of diuretic as she is still volume sensitive.  However, she also needs to have a basic metabolic profile next week and to have this followed very closely.  We may ultimately have to stop her Cozaar.   I have given her written instructions to get the basic metabolic profile checked at her primary care office.  Coronary artery disease -  The patient has no new sypmtoms.  No further cardiovascular testing is indicated.  We will continue with aggressive risk reduction and meds as listed.  Hypertension -  Her BP is elevated.  She only has morning readings.  She is going to get twice daily readings and we might have to make med changes although she has been quite sensitive to medications.   Bruit - She had mild disease in March of last year.  No further testing is indicated.    CKD - Creat in Jan of this year was 2.07  This will be followed as above.    Current medicines are reviewed at length with the patient today.  The patient does not have  concerns regarding medicines.  The following changes have been made:  None  Labs/ tests ordered today include:     No orders of the defined types were placed in this encounter.    Disposition:   FU with me in six months.    Signed, Minus Breeding, MD  02/11/2017 12:12 PM    [place]

## 2017-02-11 ENCOUNTER — Ambulatory Visit (INDEPENDENT_AMBULATORY_CARE_PROVIDER_SITE_OTHER): Payer: Medicare Other | Admitting: Cardiology

## 2017-02-11 ENCOUNTER — Encounter: Payer: Self-pay | Admitting: Cardiology

## 2017-02-11 VITALS — BP 162/60 | HR 63 | Ht 60.0 in | Wt 139.6 lb

## 2017-02-11 DIAGNOSIS — I5033 Acute on chronic diastolic (congestive) heart failure: Secondary | ICD-10-CM

## 2017-02-11 DIAGNOSIS — I10 Essential (primary) hypertension: Secondary | ICD-10-CM

## 2017-02-11 DIAGNOSIS — I2510 Atherosclerotic heart disease of native coronary artery without angina pectoris: Secondary | ICD-10-CM

## 2017-02-11 DIAGNOSIS — N184 Chronic kidney disease, stage 4 (severe): Secondary | ICD-10-CM

## 2017-02-11 NOTE — Patient Instructions (Signed)
Medication Instructions:  Continue current medications  If you need a refill on your cardiac medications before your next appointment, please call your pharmacy.  Labwork: BMP in 1 week from PCP You will NOT need to fast   Testing/Procedures: None Ordered  Follow-Up: Your physician wants you to follow-up in: 6 Months. You should receive a reminder letter in the mail two months in advance. If you do not receive a letter, please call our office [phone].    Thank you for choosing [facility] at [facility]!!

## 2017-02-12 NOTE — Addendum Note (Signed)
Addended by: Vennie Homans on: 02/12/2017 02:28 PM   Modules accepted: Orders

## 2017-02-18 DIAGNOSIS — I10 Essential (primary) hypertension: Secondary | ICD-10-CM | POA: Diagnosis not present

## 2017-02-19 ENCOUNTER — Other Ambulatory Visit: Payer: Self-pay | Admitting: *Deleted

## 2017-02-19 MED ORDER — CARVEDILOL 6.25 MG PO TABS: 6.2500 mg | ORAL_TABLET | Freq: Two times a day (BID) | ORAL | 3 refills | Status: DC

## 2017-02-28 ENCOUNTER — Telehealth: Payer: Self-pay | Admitting: *Deleted

## 2017-02-28 DIAGNOSIS — Z79899 Other long term (current) drug therapy: Secondary | ICD-10-CM

## 2017-02-28 NOTE — Telephone Encounter (Signed)
Spoke with pt son about his mom blood work  BMP order and email to pt to get done at Dr Helene Kelp office on Monday.

## 2017-02-28 NOTE — Telephone Encounter (Signed)
-----   Message from Minus Breeding, MD sent at 02/23/2017  6:38 PM EST ----- BUN is markedly elevated.  Need to repeat BMET in one week. Please make sure the results are sent to me.  Call Ms. Nhem with the results and send results to Ronita Hipps, MD.  Results need to be called to her son.

## 2017-03-04 DIAGNOSIS — Z79899 Other long term (current) drug therapy: Secondary | ICD-10-CM | POA: Diagnosis not present

## 2017-03-04 DIAGNOSIS — L209 Atopic dermatitis, unspecified: Secondary | ICD-10-CM | POA: Diagnosis not present

## 2017-03-04 DIAGNOSIS — L720 Epidermal cyst: Secondary | ICD-10-CM | POA: Diagnosis not present

## 2017-03-14 DIAGNOSIS — R8290 Unspecified abnormal findings in urine: Secondary | ICD-10-CM | POA: Diagnosis not present

## 2017-03-26 ENCOUNTER — Other Ambulatory Visit: Payer: Self-pay

## 2017-03-26 ENCOUNTER — Encounter (HOSPITAL_COMMUNITY): Payer: Self-pay | Admitting: Emergency Medicine

## 2017-03-26 ENCOUNTER — Emergency Department (HOSPITAL_COMMUNITY): Payer: Medicare Other

## 2017-03-26 ENCOUNTER — Inpatient Hospital Stay (HOSPITAL_COMMUNITY)
Admission: EM | Admit: 2017-03-26 | Discharge: 2017-04-04 | DRG: 682 | Disposition: A | Discharge status: Skilled Nursing Facility | Payer: Medicare Other | Attending: Internal Medicine | Admitting: Internal Medicine

## 2017-03-26 DIAGNOSIS — E785 Hyperlipidemia, unspecified: Secondary | ICD-10-CM | POA: Diagnosis present

## 2017-03-26 DIAGNOSIS — J209 Acute bronchitis, unspecified: Secondary | ICD-10-CM | POA: Diagnosis present

## 2017-03-26 DIAGNOSIS — R0602 Shortness of breath: Secondary | ICD-10-CM | POA: Diagnosis not present

## 2017-03-26 DIAGNOSIS — N390 Urinary tract infection, site not specified: Secondary | ICD-10-CM | POA: Diagnosis present

## 2017-03-26 DIAGNOSIS — I440 Atrioventricular block, first degree: Secondary | ICD-10-CM | POA: Diagnosis present

## 2017-03-26 DIAGNOSIS — K5900 Constipation, unspecified: Secondary | ICD-10-CM | POA: Diagnosis present

## 2017-03-26 DIAGNOSIS — G9340 Encephalopathy, unspecified: Secondary | ICD-10-CM | POA: Diagnosis present

## 2017-03-26 DIAGNOSIS — I129 Hypertensive chronic kidney disease with stage 1 through stage 4 chronic kidney disease, or unspecified chronic kidney disease: Secondary | ICD-10-CM | POA: Diagnosis not present

## 2017-03-26 DIAGNOSIS — R2681 Unsteadiness on feet: Secondary | ICD-10-CM | POA: Diagnosis not present

## 2017-03-26 DIAGNOSIS — R112 Nausea with vomiting, unspecified: Secondary | ICD-10-CM | POA: Diagnosis not present

## 2017-03-26 DIAGNOSIS — R05 Cough: Secondary | ICD-10-CM | POA: Diagnosis not present

## 2017-03-26 DIAGNOSIS — Z825 Family history of asthma and other chronic lower respiratory diseases: Secondary | ICD-10-CM

## 2017-03-26 DIAGNOSIS — Z66 Do not resuscitate: Secondary | ICD-10-CM | POA: Diagnosis present

## 2017-03-26 DIAGNOSIS — T380X5A Adverse effect of glucocorticoids and synthetic analogues, initial encounter: Secondary | ICD-10-CM | POA: Diagnosis not present

## 2017-03-26 DIAGNOSIS — N179 Acute kidney failure, unspecified: Secondary | ICD-10-CM | POA: Diagnosis present

## 2017-03-26 DIAGNOSIS — J80 Acute respiratory distress syndrome: Secondary | ICD-10-CM | POA: Diagnosis not present

## 2017-03-26 DIAGNOSIS — E1142 Type 2 diabetes mellitus with diabetic polyneuropathy: Secondary | ICD-10-CM | POA: Diagnosis present

## 2017-03-26 DIAGNOSIS — Z8249 Family history of ischemic heart disease and other diseases of the circulatory system: Secondary | ICD-10-CM

## 2017-03-26 DIAGNOSIS — L02415 Cutaneous abscess of right lower limb: Secondary | ICD-10-CM | POA: Diagnosis present

## 2017-03-26 DIAGNOSIS — I10 Essential (primary) hypertension: Secondary | ICD-10-CM | POA: Diagnosis not present

## 2017-03-26 DIAGNOSIS — N189 Chronic kidney disease, unspecified: Secondary | ICD-10-CM

## 2017-03-26 DIAGNOSIS — D509 Iron deficiency anemia, unspecified: Secondary | ICD-10-CM | POA: Diagnosis present

## 2017-03-26 DIAGNOSIS — Z90710 Acquired absence of both cervix and uterus: Secondary | ICD-10-CM

## 2017-03-26 DIAGNOSIS — Z9841 Cataract extraction status, right eye: Secondary | ICD-10-CM

## 2017-03-26 DIAGNOSIS — K219 Gastro-esophageal reflux disease without esophagitis: Secondary | ICD-10-CM | POA: Diagnosis present

## 2017-03-26 DIAGNOSIS — M6281 Muscle weakness (generalized): Secondary | ICD-10-CM | POA: Diagnosis not present

## 2017-03-26 DIAGNOSIS — R079 Chest pain, unspecified: Secondary | ICD-10-CM | POA: Diagnosis not present

## 2017-03-26 DIAGNOSIS — Z880 Allergy status to penicillin: Secondary | ICD-10-CM | POA: Diagnosis not present

## 2017-03-26 DIAGNOSIS — Z9049 Acquired absence of other specified parts of digestive tract: Secondary | ICD-10-CM

## 2017-03-26 DIAGNOSIS — E1165 Type 2 diabetes mellitus with hyperglycemia: Secondary | ICD-10-CM | POA: Diagnosis not present

## 2017-03-26 DIAGNOSIS — E860 Dehydration: Secondary | ICD-10-CM | POA: Diagnosis not present

## 2017-03-26 DIAGNOSIS — N184 Chronic kidney disease, stage 4 (severe): Secondary | ICD-10-CM | POA: Diagnosis present

## 2017-03-26 DIAGNOSIS — R001 Bradycardia, unspecified: Secondary | ICD-10-CM | POA: Diagnosis present

## 2017-03-26 DIAGNOSIS — I130 Hypertensive heart and chronic kidney disease with heart failure and stage 1 through stage 4 chronic kidney disease, or unspecified chronic kidney disease: Secondary | ICD-10-CM | POA: Diagnosis present

## 2017-03-26 DIAGNOSIS — E875 Hyperkalemia: Secondary | ICD-10-CM | POA: Diagnosis present

## 2017-03-26 DIAGNOSIS — R109 Unspecified abdominal pain: Secondary | ICD-10-CM | POA: Diagnosis not present

## 2017-03-26 DIAGNOSIS — I2510 Atherosclerotic heart disease of native coronary artery without angina pectoris: Secondary | ICD-10-CM | POA: Diagnosis not present

## 2017-03-26 DIAGNOSIS — E872 Acidosis: Secondary | ICD-10-CM | POA: Diagnosis not present

## 2017-03-26 DIAGNOSIS — Z794 Long term (current) use of insulin: Secondary | ICD-10-CM | POA: Diagnosis not present

## 2017-03-26 DIAGNOSIS — I25110 Atherosclerotic heart disease of native coronary artery with unstable angina pectoris: Secondary | ICD-10-CM | POA: Diagnosis present

## 2017-03-26 DIAGNOSIS — E1122 Type 2 diabetes mellitus with diabetic chronic kidney disease: Secondary | ICD-10-CM | POA: Diagnosis present

## 2017-03-26 DIAGNOSIS — Y92230 Patient room in hospital as the place of occurrence of the external cause: Secondary | ICD-10-CM | POA: Diagnosis not present

## 2017-03-26 DIAGNOSIS — G4700 Insomnia, unspecified: Secondary | ICD-10-CM | POA: Diagnosis not present

## 2017-03-26 DIAGNOSIS — I5033 Acute on chronic diastolic (congestive) heart failure: Secondary | ICD-10-CM | POA: Diagnosis not present

## 2017-03-26 DIAGNOSIS — Z9842 Cataract extraction status, left eye: Secondary | ICD-10-CM

## 2017-03-26 DIAGNOSIS — Z888 Allergy status to other drugs, medicaments and biological substances status: Secondary | ICD-10-CM | POA: Diagnosis not present

## 2017-03-26 DIAGNOSIS — Z823 Family history of stroke: Secondary | ICD-10-CM

## 2017-03-26 DIAGNOSIS — Z79899 Other long term (current) drug therapy: Secondary | ICD-10-CM

## 2017-03-26 DIAGNOSIS — I5030 Unspecified diastolic (congestive) heart failure: Secondary | ICD-10-CM | POA: Diagnosis not present

## 2017-03-26 DIAGNOSIS — K529 Noninfective gastroenteritis and colitis, unspecified: Secondary | ICD-10-CM | POA: Diagnosis present

## 2017-03-26 DIAGNOSIS — E1129 Type 2 diabetes mellitus with other diabetic kidney complication: Secondary | ICD-10-CM

## 2017-03-26 DIAGNOSIS — R51 Headache: Secondary | ICD-10-CM | POA: Diagnosis not present

## 2017-03-26 DIAGNOSIS — D539 Nutritional anemia, unspecified: Secondary | ICD-10-CM | POA: Diagnosis present

## 2017-03-26 DIAGNOSIS — I209 Angina pectoris, unspecified: Secondary | ICD-10-CM | POA: Diagnosis not present

## 2017-03-26 DIAGNOSIS — I5040 Unspecified combined systolic (congestive) and diastolic (congestive) heart failure: Secondary | ICD-10-CM | POA: Diagnosis not present

## 2017-03-26 DIAGNOSIS — B952 Enterococcus as the cause of diseases classified elsewhere: Secondary | ICD-10-CM | POA: Diagnosis present

## 2017-03-26 HISTORY — DX: Chest pain, unspecified: R07.9

## 2017-03-26 HISTORY — DX: Pneumonia, unspecified organism: J18.9

## 2017-03-26 HISTORY — DX: Gastro-esophageal reflux disease without esophagitis: K21.9

## 2017-03-26 HISTORY — DX: Unspecified osteoarthritis, unspecified site: M19.90

## 2017-03-26 HISTORY — DX: Dyspnea, unspecified: R06.00

## 2017-03-26 LAB — COMPREHENSIVE METABOLIC PANEL
ALT: 18 U/L (ref 14–54)
AST: 13 U/L — AB (ref 15–41)
Albumin: 3.2 g/dL — ABNORMAL LOW (ref 3.5–5.0)
Alkaline Phosphatase: 82 U/L (ref 38–126)
Anion gap: 11 (ref 5–15)
BUN: 109 mg/dL — ABNORMAL HIGH (ref 6–20)
CHLORIDE: 103 mmol/L (ref 101–111)
CO2: 18 mmol/L — AB (ref 22–32)
CREATININE: 3.9 mg/dL — AB (ref 0.44–1.00)
Calcium: 9 mg/dL (ref 8.9–10.3)
GFR, EST AFRICAN AMERICAN: 11 mL/min — AB (ref >60)
GFR, EST NON AFRICAN AMERICAN: 9 mL/min — AB (ref >60)
Glucose, Bld: 111 mg/dL — ABNORMAL HIGH (ref 65–99)
POTASSIUM: 6.2 mmol/L — AB (ref 3.5–5.1)
SODIUM: 132 mmol/L — AB (ref 135–145)
Total Bilirubin: 0.7 mg/dL (ref 0.3–1.2)
Total Protein: 5.5 g/dL — ABNORMAL LOW (ref 6.5–8.1)

## 2017-03-26 LAB — BASIC METABOLIC PANEL
ANION GAP: 11 (ref 5–15)
BUN: 108 mg/dL — ABNORMAL HIGH (ref 6–20)
CALCIUM: 8.7 mg/dL — AB (ref 8.9–10.3)
CHLORIDE: 104 mmol/L (ref 101–111)
CO2: 18 mmol/L — AB (ref 22–32)
Creatinine, Ser: 3.78 mg/dL — ABNORMAL HIGH (ref 0.44–1.00)
GFR calc non Af Amer: 10 mL/min — ABNORMAL LOW (ref >60)
GFR, EST AFRICAN AMERICAN: 11 mL/min — AB (ref >60)
GLUCOSE: 76 mg/dL (ref 65–99)
POTASSIUM: 5.8 mmol/L — AB (ref 3.5–5.1)
Sodium: 133 mmol/L — ABNORMAL LOW (ref 135–145)

## 2017-03-26 LAB — CBC WITH DIFFERENTIAL/PLATELET
BASOS ABS: 0 10*3/uL (ref 0.0–0.1)
Basophils Relative: 0 %
EOS ABS: 0.1 10*3/uL (ref 0.0–0.7)
EOS PCT: 2 %
HCT: 27.4 % — ABNORMAL LOW (ref 36.0–46.0)
Hemoglobin: 8.8 g/dL — ABNORMAL LOW (ref 12.0–15.0)
Lymphocytes Relative: 16 %
Lymphs Abs: 1 10*3/uL (ref 0.7–4.0)
MCH: 32.6 pg (ref 26.0–34.0)
MCHC: 32.1 g/dL (ref 30.0–36.0)
MCV: 101.5 fL — ABNORMAL HIGH (ref 78.0–100.0)
MONO ABS: 0.3 10*3/uL (ref 0.1–1.0)
Monocytes Relative: 5 %
Neutro Abs: 4.6 10*3/uL (ref 1.7–7.7)
Neutrophils Relative %: 77 %
PLATELETS: 125 10*3/uL — AB (ref 150–400)
RBC: 2.7 MIL/uL — AB (ref 3.87–5.11)
RDW: 14.1 % (ref 11.5–15.5)
WBC: 5.9 10*3/uL (ref 4.0–10.5)

## 2017-03-26 LAB — URINALYSIS, ROUTINE W REFLEX MICROSCOPIC
Bacteria, UA: NONE SEEN
Bilirubin Urine: NEGATIVE
GLUCOSE, UA: NEGATIVE mg/dL
HGB URINE DIPSTICK: NEGATIVE
KETONES UR: NEGATIVE mg/dL
Nitrite: NEGATIVE
PH: 5 (ref 5.0–8.0)
PROTEIN: NEGATIVE mg/dL
Specific Gravity, Urine: 1.01 (ref 1.005–1.030)

## 2017-03-26 LAB — PHOSPHORUS: PHOSPHORUS: 5.2 mg/dL — AB (ref 2.5–4.6)

## 2017-03-26 LAB — PROTEIN / CREATININE RATIO, URINE
Creatinine, Urine: 33.6 mg/dL
Protein Creatinine Ratio: 0.3 mg/mg{Cre} — ABNORMAL HIGH (ref 0.00–0.15)
Total Protein, Urine: 10 mg/dL

## 2017-03-26 LAB — NA AND K (SODIUM & POTASSIUM), RAND UR
POTASSIUM UR: 22 mmol/L
Sodium, Ur: 76 mmol/L

## 2017-03-26 LAB — I-STAT CG4 LACTIC ACID, ED: Lactic Acid, Venous: 0.61 mmol/L (ref 0.5–1.9)

## 2017-03-26 LAB — LIPASE, BLOOD: LIPASE: 20 U/L (ref 11–51)

## 2017-03-26 LAB — MAGNESIUM: MAGNESIUM: 2.3 mg/dL (ref 1.7–2.4)

## 2017-03-26 LAB — HEMOGLOBIN A1C
Hgb A1c MFr Bld: 5.5 % (ref 4.8–5.6)
Mean Plasma Glucose: 111.15 mg/dL

## 2017-03-26 LAB — GLUCOSE, CAPILLARY: GLUCOSE-CAPILLARY: 56 mg/dL — AB (ref 65–99)

## 2017-03-26 MED ORDER — INSULIN ASPART 100 UNIT/ML ~~LOC~~ SOLN
10.0000 [IU] | Freq: Once | SUBCUTANEOUS | Status: AC
  Administered 2017-03-26: 10 [IU] via INTRAVENOUS
  Filled 2017-03-26: qty 1

## 2017-03-26 MED ORDER — DICYCLOMINE HCL 10 MG PO CAPS
10.0000 mg | ORAL_CAPSULE | Freq: Three times a day (TID) | ORAL | Status: DC
  Administered 2017-03-27 – 2017-03-28 (×4): 10 mg via ORAL
  Filled 2017-03-26 (×4): qty 1

## 2017-03-26 MED ORDER — ONDANSETRON 4 MG PO TBDP
8.0000 mg | ORAL_TABLET | Freq: Once | ORAL | Status: AC
  Administered 2017-03-26: 8 mg via ORAL
  Filled 2017-03-26: qty 2

## 2017-03-26 MED ORDER — BRINZOLAMIDE 1 % OP SUSP
1.0000 [drp] | Freq: Two times a day (BID) | OPHTHALMIC | Status: DC
  Administered 2017-03-27 – 2017-04-04 (×18): 1 [drp] via OPHTHALMIC
  Filled 2017-03-26: qty 10

## 2017-03-26 MED ORDER — ACETAMINOPHEN 325 MG PO TABS
650.0000 mg | ORAL_TABLET | Freq: Four times a day (QID) | ORAL | Status: DC | PRN
Start: 2017-03-26 — End: 2017-04-04
  Administered 2017-03-27 – 2017-04-03 (×11): 650 mg via ORAL
  Filled 2017-03-26 (×11): qty 2

## 2017-03-26 MED ORDER — ACETAMINOPHEN 650 MG RE SUPP: 650.0000 mg | Freq: Four times a day (QID) | RECTAL | Status: DC | PRN

## 2017-03-26 MED ORDER — DOCUSATE SODIUM 100 MG PO CAPS
100.0000 mg | ORAL_CAPSULE | Freq: Two times a day (BID) | ORAL | Status: DC
  Administered 2017-03-27 – 2017-04-04 (×16): 100 mg via ORAL
  Filled 2017-03-26 (×17): qty 1

## 2017-03-26 MED ORDER — PANTOPRAZOLE SODIUM 40 MG PO TBEC
40.0000 mg | DELAYED_RELEASE_TABLET | Freq: Every day | ORAL | Status: DC
  Administered 2017-03-27 – 2017-04-04 (×9): 40 mg via ORAL
  Filled 2017-03-26 (×9): qty 1

## 2017-03-26 MED ORDER — TIMOLOL MALEATE 0.5 % OP SOLN
1.0000 [drp] | Freq: Two times a day (BID) | OPHTHALMIC | Status: DC
  Administered 2017-03-27 – 2017-04-04 (×18): 1 [drp] via OPHTHALMIC
  Filled 2017-03-26: qty 5

## 2017-03-26 MED ORDER — HYDRALAZINE HCL 50 MG PO TABS: 50.0000 mg | ORAL_TABLET | Freq: Three times a day (TID) | ORAL | Status: DC

## 2017-03-26 MED ORDER — INSULIN ASPART 100 UNIT/ML ~~LOC~~ SOLN
0.0000 [IU] | Freq: Three times a day (TID) | SUBCUTANEOUS | Status: DC
  Administered 2017-03-27: 2 [IU] via SUBCUTANEOUS
  Administered 2017-03-28 – 2017-03-29 (×2): 1 [IU] via SUBCUTANEOUS
  Administered 2017-03-30: 2 [IU] via SUBCUTANEOUS
  Administered 2017-03-31: 1 [IU] via SUBCUTANEOUS
  Administered 2017-04-01: 3 [IU] via SUBCUTANEOUS
  Administered 2017-04-02 (×2): 5 [IU] via SUBCUTANEOUS
  Administered 2017-04-03: 9 [IU] via SUBCUTANEOUS
  Administered 2017-04-03 – 2017-04-04 (×2): 5 [IU] via SUBCUTANEOUS

## 2017-03-26 MED ORDER — SODIUM CHLORIDE 0.9 % IV SOLN
INTRAVENOUS | Status: AC
  Administered 2017-03-26: 22:00:00 via INTRAVENOUS

## 2017-03-26 MED ORDER — HEPARIN SODIUM (PORCINE) 5000 UNIT/ML IJ SOLN
5000.0000 [IU] | Freq: Three times a day (TID) | INTRAMUSCULAR | Status: DC
  Administered 2017-03-27 – 2017-04-04 (×26): 5000 [IU] via SUBCUTANEOUS
  Filled 2017-03-26 (×24): qty 1

## 2017-03-26 MED ORDER — LATANOPROST 0.005 % OP SOLN
1.0000 [drp] | Freq: Every day | OPHTHALMIC | Status: DC
  Administered 2017-03-27 – 2017-04-03 (×9): 1 [drp] via OPHTHALMIC
  Filled 2017-03-26: qty 2.5

## 2017-03-26 MED ORDER — SENNA 8.6 MG PO TABS
1.0000 | ORAL_TABLET | Freq: Two times a day (BID) | ORAL | Status: DC
  Administered 2017-03-27 – 2017-04-04 (×16): 8.6 mg via ORAL
  Filled 2017-03-26 (×17): qty 1

## 2017-03-26 MED ORDER — ONDANSETRON HCL 4 MG/2ML IJ SOLN: 4.0000 mg | Freq: Four times a day (QID) | INTRAMUSCULAR | Status: DC | PRN

## 2017-03-26 MED ORDER — CARVEDILOL 12.5 MG PO TABS: 12.5000 mg | ORAL_TABLET | Freq: Two times a day (BID) | ORAL | Status: DC

## 2017-03-26 MED ORDER — SODIUM POLYSTYRENE SULFONATE 15 GM/60ML PO SUSP
30.0000 g | Freq: Once | ORAL | Status: AC
  Administered 2017-03-26: 30 g via ORAL

## 2017-03-26 MED ORDER — SODIUM CHLORIDE 0.9 % IV SOLN
1.0000 g | Freq: Once | INTRAVENOUS | Status: AC
  Administered 2017-03-27: 1 g via INTRAVENOUS
  Filled 2017-03-26: qty 10

## 2017-03-26 MED ORDER — ONDANSETRON HCL 4 MG PO TABS
4.0000 mg | ORAL_TABLET | Freq: Four times a day (QID) | ORAL | Status: DC | PRN
  Administered 2017-03-27 – 2017-03-29 (×2): 4 mg via ORAL
  Filled 2017-03-26: qty 1

## 2017-03-26 MED ORDER — DEXTROSE 50 % IV SOLN
INTRAVENOUS | Status: AC
  Administered 2017-03-26: 50 mL
  Filled 2017-03-26: qty 50

## 2017-03-26 MED ORDER — DEXTROSE 50 % IV SOLN
1.0000 | Freq: Once | INTRAVENOUS | Status: AC
  Administered 2017-03-26: 50 mL via INTRAVENOUS
  Filled 2017-03-26: qty 50

## 2017-03-26 MED ORDER — SODIUM CHLORIDE 0.9% FLUSH
3.0000 mL | Freq: Two times a day (BID) | INTRAVENOUS | Status: DC
  Administered 2017-03-27 – 2017-04-04 (×16): 3 mL via INTRAVENOUS

## 2017-03-26 MED ORDER — SODIUM CHLORIDE 0.9 % IV BOLUS (SEPSIS)
1000.0000 mL | Freq: Once | INTRAVENOUS | Status: AC
Start: 2017-03-26 — End: 2017-03-26
  Administered 2017-03-26: 1000 mL via INTRAVENOUS

## 2017-03-26 MED ORDER — INSULIN ASPART 100 UNIT/ML ~~LOC~~ SOLN: 0.0000 [IU] | SUBCUTANEOUS | Status: DC

## 2017-03-26 MED ORDER — SODIUM CHLORIDE 0.9 % IV BOLUS (SEPSIS)
500.0000 mL | Freq: Once | INTRAVENOUS | Status: AC
  Administered 2017-03-26: 500 mL via INTRAVENOUS

## 2017-03-26 MED ORDER — HYDRALAZINE HCL 50 MG PO TABS
50.0000 mg | ORAL_TABLET | Freq: Three times a day (TID) | ORAL | Status: DC
  Administered 2017-03-27 – 2017-04-01 (×18): 50 mg via ORAL
  Filled 2017-03-26 (×18): qty 1

## 2017-03-26 MED ORDER — FERROUS SULFATE 325 (65 FE) MG PO TABS
325.0000 mg | ORAL_TABLET | Freq: Every day | ORAL | Status: DC
  Administered 2017-03-27 – 2017-04-04 (×9): 325 mg via ORAL
  Filled 2017-03-26 (×9): qty 1

## 2017-03-26 MED ORDER — CARVEDILOL 6.25 MG PO TABS
6.2500 mg | ORAL_TABLET | Freq: Two times a day (BID) | ORAL | Status: DC
  Administered 2017-03-27 – 2017-04-04 (×17): 6.25 mg via ORAL
  Filled 2017-03-26 (×18): qty 1

## 2017-03-26 NOTE — H&P (Signed)
History and Physical    Kelly Ruiz LOV:[id number] DOB: [birth year] DOA: 03/26/2017  PCP: Ronita Hipps, MD Patient coming from: Home  I have personally briefly reviewed patient's old medical records in [place]  Chief Complaint: Fatigue, nausea, vomiting  HPI: Kelly Ruiz is a 84 y.o. female with medical history significant for heart failure with preserved ejection fraction, CKD stage IV, insulin-dependent diabetes mellitus type 2, CAD, hypertension, history of diverticular bleed with resultant chronic iron deficiency anemia and recurrent right hip abscess status post 2 recent courses of antibiotics who presents to the ED from her home where she lives with her husband and full-time caretakers Monday through Friday with approximately 1 week of fatigue in 2-3 days of nausea, decreased oral intake and yellow brown watery emesis approximately 3 times within that timeframe.  Patient denies fever, myalgias, cough, shortness of breath, chest pain, abdominal pain, diarrhea, dysuria.  She reports mildly darkened urine from her baseline but has not noticed any decrease in her urine output.  She had episode of nausea and vomiting in the ED, but reports that her symptoms were improved after Zofran administration.  No sick contacts at home.  Husband does not have similar symptoms.  She cannot think of any dietary indiscretions.  She has been taking her medications as prescribed.  ED Course: In the ED, patient is afebrile, mildly bradycardic, normotensive and saturating comfortably on room air.  Labs notable for WBC 5.9, Hgb 8.8, platelets 125, NA 132, K6.2, CL 103, CO2 18, BUN 109, creatinine 3.90 (baseline approximately 1.5-1.7).  UA showed small leukocyte esterase, negative nitrites, bland urinary sediment.  EKG shows peaked T waves, widening QRS with left bundle branch pattern and first-degree AV block.  CT abdomen pelvis shows moderate stool burden, atrophic kidneys without obstructive findings  and multiple small bibasilar pulmonary nodules.  Patient received 1.5 L normal saline, 10 units of insulin aspart, D50, Kayexalate, calcium gluconate and Zofran while in the ED.  Review of Systems: As per HPI otherwise 10 point review of systems negative.   Past Medical History:  Diagnosis Date  . CHF (congestive heart failure) (HCC)    Diastolic. Precipitated by Actos  . Chronic kidney disease    mild-moderate. GFR was 35 in 02/12  . Coronary artery disease 02/2010   abnormal nuclear stress test with mild ischemia in apical anterolateral wall.   . DM (diabetes mellitus) ([place])   . Dyslipidemia   . Hypertension   . Nasal polyps   . Unspecified hypertensive heart disease without heart failure     Past Surgical History:  Procedure Laterality Date  . APPENDECTOMY  1951  . CATARACT EXTRACTION, BILATERAL    . CHOLECYSTECTOMY    . GLAUCOMA SURGERY  10/29/2013  . hysterectomy - unknown type    . NECK MASS EXCISION     non cancerous  . TONSILLECTOMY       reports that  has never smoked. she has never used smokeless tobacco. She reports that she does not drink alcohol or use drugs.  Allergies  Allergen Reactions  . Actos [Pioglitazone Hydrochloride]   . Felodipine Er   . Hctz [Hydrochlorothiazide]   . Lisinopril   . Plendil [Felodipine]   . Amlodipine Besylate Other (See Comments) and Hives  . Brimonidine Tartrate Other (See Comments)    Eye redness and itchiness, blurred vision  . Clonidine Derivatives Rash  . Combigan [Brimonidine Tartrate-Timolol] Other (See Comments)    Eye redness and itchiness, blurred vision  .  Dorzolamide Other (See Comments)    Increased eye pressure, blurred vision  . Monopril [Fosinopril Sodium] Cough  . Norvasc [Amlodipine Besylate] Hives  . Penicillins Rash    Has patient had a PCN reaction causing immediate rash, facial/tongue/throat swelling, SOB or lightheadedness with hypotension: unknown Has patient had a PCN reaction causing severe rash  involving mucus membranes or skin necrosis: unknown Has patient had a PCN reaction that required hospitalization: unknown Has patient had a PCN reaction occurring within the last 10 years: no If all of the above answers are "NO", then may proceed with Cephalosporin use.     Family History  Problem Relation Age of Onset  . Diabetes Unknown   . Hypertension Unknown   . Stroke Unknown   . Asthma Mother   . Allergic rhinitis Mother   . Congestive Heart Failure Mother   . Stroke Mother     Prior to Admission medications   Medication Sig Start Date End Date Taking? Authorizing Provider  acetaminophen (TYLENOL) 500 MG tablet Take 1,000 mg by mouth 2 (two) times daily.    Yes [provider]  brinzolamide (AZOPT) 1 % ophthalmic suspension Place 1 drop into the right eye 2 (two) times daily.    Yes [provider]  bumetanide (BUMEX) 1 MG tablet Take 1 tablet (1 mg total) by mouth every morning and take 0.5 mg tablet (0.5 mg total) by mouth every evening. 06/13/16  Yes [provider]  carvedilol (COREG) 12.5 MG tablet Take 1 tablet (12.5 mg total) by mouth 2 (two) times daily with a meal. Patient taking differently: Take 12.5 mg by mouth 2 (two) times daily with a meal. Take with 6.25 mg to make 18.75mg  08/03/10  Yes Wellington Hampshire, MD  carvedilol (COREG) 6.25 MG tablet Take 1 tablet (6.25 mg total) by mouth 2 (two) times daily. Take with 12.5 mg to make 18.75mg  02/19/17  Yes Minus Breeding, MD  cyanocobalamin 1000 MCG tablet Take 1,000 mcg by mouth daily.    Yes [provider]  dicyclomine (BENTYL) 10 MG capsule Take 10 mg by mouth 3 (three) times daily before meals.    Yes [provider]  diphenoxylate-atropine (LOMOTIL) 2.5-0.025 MG tablet Take 1 tablet by mouth 3 (three) times daily. 09/06/16  Yes [provider]  ferrous sulfate 325 (65 FE) MG tablet Take 325 mg by mouth 2 (two) times daily with a meal.    Yes [provider]    fluticasone (FLONASE) 50 MCG/ACT nasal spray Place 2 sprays into both nostrils 2 (two) times daily.   Yes [provider]  hydrALAZINE (APRESOLINE) 50 MG tablet Take 1 tablet (50 mg total) by mouth 3 (three) times daily. 12/07/16  Yes Danford, Suann Larry, MD  Insulin Glargine (LANTUS SOLOSTAR) 100 UNIT/ML Solostar Pen Inject 2 Units into the skin daily as needed. B.S over 200 Sliding scale   Yes [provider]  isosorbide mononitrate (IMDUR) 120 MG 24 hr tablet Take 2 tablets (240 mg total) by mouth daily. 05/03/16  Yes Minus Breeding, MD  latanoprost (XALATAN) 0.005 % ophthalmic solution Place 1 drop into the right eye at bedtime.  01/20/13  Yes [provider]  Liniments (SALONPAS ARTHRITIS PAIN RELIEF EX) Apply 1 patch topically every 12 (twelve) hours.   Yes [provider]  losartan (COZAAR) 50 MG tablet Take 50 mg by mouth at bedtime.    Yes [provider]  Multiple Vitamins-Minerals (EYE VITAMINS PO) Take 1 tablet by mouth  2 (two) times daily.    Yes [provider]  mupirocin ointment (BACTROBAN) 2 % Apply 1 application topically 2 (two) times daily. 03/04/17  Yes [provider]  nitroGLYCERIN (NITROSTAT) 0.4 MG SL tablet Place 1 tablet (0.4 mg total) under the tongue every 5 (five) minutes as needed for chest pain. 03/22/15  Yes Minus Breeding, MD  omeprazole (PRILOSEC) 20 MG capsule Take 20 mg by mouth daily.   Yes [provider]  timolol (TIMOPTIC) 0.5 % ophthalmic solution Place 1 drop into the right eye 2 (two) times daily.  01/14/12  Yes [provider]  traZODone (DESYREL) 50 MG tablet Take 75 mg by mouth at bedtime. 09/28/16  Yes [provider]  vitamin C (ASCORBIC ACID) 500 MG tablet Take 500 mg by mouth 2 (two) times daily.    Yes [provider]    Physical Exam: Vitals:   03/26/17 1703 03/26/17 1730 03/26/17 1901 03/26/17 1945  BP: (!) 122/54 (!) 126/54 120/85 (!) 104/59   Pulse: (!) 59 (!) 58 60 (!) 56  Resp: 16 14 18 17   Temp:      TempSrc:      SpO2: 99% 100% 100% 99%  Weight:      Height:        Constitutional: NAD, calm, comfortable Eyes: PERRL, pale conjunctiva ENMT: Mucous membranes are mildly dry. Posterior pharynx clear of any exudate or lesions.  Neck: normal, supple Respiratory: clear to auscultation bilaterally, no wheezing, no crackles Cardiovascular: Regular rate and rhythm, no murmurs / rubs / gallops. No extremity edema. 1+ pedal pulses. Abdomen: no tenderness, no masses palpated. Bowel sounds positive.  Musculoskeletal: no clubbing / cyanosis. No joint deformity upper and lower extremities. Good ROM, no contractures. Skin: no rashes, lesions, ulcers. Neurologic: CN 2-12 grossly intact. Sensation intact, DTR normal. Strength 5/5 in all 4.  Psychiatric: Normal judgment and insight. Alert and oriented x 3. Normal mood.   Labs on Admission: I have personally reviewed following labs and imaging studies  CBC: Recent Labs  Lab 03/26/17 1355  WBC 5.9  NEUTROABS 4.6  HGB 8.8*  HCT 27.4*  MCV 101.5*  PLT 106*   Basic Metabolic Panel: Recent Labs  Lab 03/26/17 1355  NA 132*  K 6.2*  CL 103  CO2 18*  GLUCOSE 111*  BUN 109*  CREATININE 3.90*  CALCIUM 9.0   GFR: Estimated Creatinine Clearance: 7.9 mL/min (A) (by C-G formula based on SCr of 3.9 mg/dL (H)). Liver Function Tests: Recent Labs  Lab 03/26/17 1355  AST 13*  ALT 18  ALKPHOS 82  BILITOT 0.7  PROT 5.5*  ALBUMIN 3.2*   Recent Labs  Lab 03/26/17 1355  LIPASE 20   No results for input(s): AMMONIA in the last 168 hours. Coagulation Profile: No results for input(s): INR, PROTIME in the last 168 hours. Cardiac Enzymes: No results for input(s): CKTOTAL, CKMB, CKMBINDEX, TROPONINI in the last 168 hours. BNP (last 3 results) No results for input(s): PROBNP in the last 8760 hours. HbA1C: No results for input(s): HGBA1C in the last 72 hours. CBG: No results  for input(s): GLUCAP in the last 168 hours. Lipid Profile: No results for input(s): CHOL, HDL, LDLCALC, TRIG, CHOLHDL, LDLDIRECT in the last 72 hours. Thyroid Function Tests: No results for input(s): TSH, T4TOTAL, FREET4, T3FREE, THYROIDAB in the last 72 hours. Anemia Panel: No results for input(s): VITAMINB12, FOLATE, FERRITIN, TIBC, IRON, RETICCTPCT in the last 72 hours. Urine analysis:    Component Value Date/Time  COLORURINE STRAW (A) 03/26/2017 [place] 03/26/2017 1429   LABSPEC 1.010 03/26/2017 1429   PHURINE 5.0 03/26/2017 1429   GLUCOSEU NEGATIVE 03/26/2017 1429   HGBUR NEGATIVE 03/26/2017 1429   BILIRUBINUR NEGATIVE 03/26/2017 1429   KETONESUR NEGATIVE 03/26/2017 1429   PROTEINUR NEGATIVE 03/26/2017 1429   NITRITE NEGATIVE 03/26/2017 1429   LEUKOCYTESUR SMALL (A) 03/26/2017 1429    Radiological Exams on Admission: Ct Abdomen Pelvis Wo Contrast  Result Date: 03/26/2017 CLINICAL DATA:  Mid abdominal pain and vomiting for 3 days. History of diabetes, hypertension, hysterectomy, cholecystectomy and appendectomy. EXAM: CT ABDOMEN AND PELVIS WITHOUT CONTRAST TECHNIQUE: Multidetector CT imaging of the abdomen and pelvis was performed following the standard protocol without IV contrast. COMPARISON:  None. FINDINGS: LOWER CHEST: Multiple sub solid bibasilar pulmonary nodules measuring to 4 mm. Imaged heart is upper limits of normal in size. Mild coronary artery calcifications, mitral annular calcifications. No pericardial effusion. HEPATOBILIARY: Status post cholecystectomy. Negative noncontrast CT liver. PANCREAS: Atrophic, nonacute. SPLEEN: Normal. Inter splenic calcified granuloma versus vascular calcification. ADRENALS/URINARY TRACT: Kidneys are orthotopic, moderate symmetric atrophy. No nephrolithiasis, hydronephrosis; limited assessment for renal masses on this nonenhanced examination. The unopacified ureters are normal in course and caliber. Urinary bladder is well  distended and unremarkable. Normal adrenal glands. STOMACH/BOWEL: The stomach, small and large bowel are normal in course and caliber without inflammatory changes, sensitivity decreased by lack of enteric contrast. 8 mm ovoid calcification dependent in gastric antrum. Moderate volume retained large bowel stool. Mild colonic diverticulosis. Moderate air and fluid containing duodenal diverticulum. VASCULAR/LYMPHATIC: Aortoiliac vessels are normal in course and caliber. Moderate calcific atherosclerosis. No lymphadenopathy by CT size criteria. REPRODUCTIVE: Status post hysterectomy. OTHER: No intraperitoneal free fluid or free air. MUSCULOSKELETAL: Non-acute. Osteopenia. Levoscoliosis and severe degenerative change of lumbar spine. Thickened T12 trabecular with small associated lucency seen with Paget's disease or hemangioma. Focal lucency LEFT humeral head equivocal for avascular necrosis without collapse. Anterior abdominal wall ligamentous laxity and scarring. IMPRESSION: 1. Moderate volume retained large bowel stool without bowel obstruction nor acute intra-abdominal/pelvic process. 2. Moderately atrophic kidneys without nephrolithiasis or obstructive uropathy. 3. **An incidental finding of potential clinical significance has been found. Multiple bibasilar sub solid pulmonary nodules measuring less than 6 mm. Recommend follow-up CT chest at 3-6 months. This recommendation follows the consensus statement: Guidelines for Management of Small Pulmonary Nodules Detected on CT Images: From the Fleischner Society 2017; Radiology 2017; 284:228-243.** Aortic Atherosclerosis (ICD10-I70.0). Electronically Signed   By: Elon Alas M.D.   On: 03/26/2017 16:19    EKG: Independently reviewed. Sinus bradycardia, first degree AV block. Widened QRS, LVH with repolarization abnormalities unchanged from prior. Peaked T waves most prominent in anteroseptal leads.    Assessment/Plan Active Problems:   AKI (acute kidney  injury) ([place])  Prerenal AKI on CKD - Continue hydration with NS despite acidosis given concurrent hyperkalemia - Obtain urine studies - No urgent need for dialysis despite azotemia - Will need Nephrology consult in AM - Hold home Bumex, losartan - Avoid nephrotoxins - Place Foley, maintain strict I/O - Trend renal function - Daily BMP, Mg, PO4  Hyperkalemia 2/2 AKI - s/p insulin, D50, calcium gluconate kayexalate in ED - Repeat BMP on arrival to floor - Monitor on telemetry - Management of AKI as above  Non-anion gap metabolic acidosis in the setting of renal disease, azotemia - IVF as above, balanced crystalloids preferred, however hyperkalemic in ED - No urgent need for dialysis as patient is mentating well, no  evidence for uremia - Trend renal indices as above  CAD, HFpEF HTN - Decrease Coreg to 6.25 mg BID given relative bradycardia - Continue hydralazine per home - Holding Bumex, losartan given AKI - Also holding Imdur, may resume - Pt not on ASA, statin at the time of admission  IDDM2 with polyneuropathy - Off glycemic meds at home given age - SSI, FSBS ACHS  Macrocytic anemia, chronic - Folate, B12 wnl in November 2018 - Continue home iron supplementation  DVT prophylaxis: SQH Code Status: Full Family Communication: Son, Chimere Klingensmith Disposition Plan: Home in 3-4 days Consults called: None Admission status: Inpatient, Telemetry   Len Kluver Sharene Butters MD [facility]  If 7PM-7AM, please contact night-coverage [email or web address] Password [facility]  03/26/2017, 8:50 PM

## 2017-03-26 NOTE — ED Provider Notes (Signed)
Patient placed in Quick Look pathway, seen and evaluated   Chief Complaint: nausea, vomiting  HPI:   Patient presents to emergency department complaining of nausea, vomiting, abdominal discomfort for the last 3 days.  She denies any diarrhea.  Denies any urinary symptoms.  No fever, chills.  Reports generalized malaise and not feeling well.  Recently has been treated for recurrent abscesses to the right hip, finished 2 courses of antibiotics.  Denies any other flulike symptoms or upper respiratory symptoms.  Decreased appetite, not eating due to nausea.  No history of abdominal issues pain.  ROS: Positive for abdominal discomfort, nausea, vomiting, weakness.  Negative for diarrhea, negative for back pain, negative for urinary symptoms, negative for fever or chills.  Physical Exam:   Gen: No distress  Neuro: Awake and Alert  Skin: Warm    Focused Exam: Abdomen diffusely tender, no guarding or rebound tenderness.  No CVA tenderness.  Healing abscess to the right hip, nontender, no cellulitic changes.  1:57 PM Patient seen and medically screened in triage, patient with abdominal pain, nausea, vomiting for 3 days.  She is not eating or drinking.  History of dehydration.  Her vital signs are normal here, she appears to be nontoxic.  No evidence of sepsis based on exam and vital signs.  Will check labs, EKG, urine analysis.  Due to nausea, vomiting, abdominal tenderness, will get CT abdomen and pelvis for further evaluation.  Vitals:   03/26/17 1324 03/26/17 1344  BP: (!) 134/49   Pulse: (!) 55   Resp: 17   Temp: 98.6 F (37 C)   TempSrc: Oral   SpO2: 100%   Weight:  65.3 kg (144 lb)  Height:  5' (1.524 m)     Initiation of care has begun. The patient has been counseled on the process, plan, and necessity for staying for the completion/evaluation, and the remainder of the medical screening examination    Jeannett Senior, PA-C 03/26/17 1358    Isla Pence, MD 03/26/17  1459

## 2017-03-26 NOTE — ED Notes (Signed)
Heart healthy ordered

## 2017-03-26 NOTE — ED Provider Notes (Signed)
[facility] EMERGENCY DEPARTMENT Provider Note   CSN: [id number] Arrival date & time: 03/26/17  1303     History   Chief Complaint Chief Complaint  Patient presents with  . Emesis    HPI Kelly Ruiz is a 84 y.o. female.  HPI  Patient with history of CHF, chronic kidney disease baseline creatinine approximately 1.6, diabetes, hypertension, coronary artery disease presents with complaint of nausea and vomiting.  She began to feel sick and more weak than usual 3 days ago.  She had vomiting 2 days ago yesterday and today.  Emesis is nonbloody and nonbilious.  She denies abdominal pain.  She has not had much appetite and has not been eating or drinking much for the past several days.  She denies fever or chills.  No shortness of breath or chest pain.  No leg swelling or dysuria.  There are no other associated systemic symptoms, there are no other alleviating or modifying factors.   Past Medical History:  Diagnosis Date  . CHF (congestive heart failure) (HCC)    Diastolic. Precipitated by Actos  . Chronic kidney disease    mild-moderate. GFR was 35 in 02/12  . Coronary artery disease 02/2010   abnormal nuclear stress test with mild ischemia in apical anterolateral wall.   . DM (diabetes mellitus) ([place])   . Dyslipidemia   . Hypertension   . Nasal polyps   . Unspecified hypertensive heart disease without heart failure     Patient Active Problem List   Diagnosis Date Noted  . CKD (chronic kidney disease), stage IV ([place]) 02/11/2017  . Acute on chronic diastolic HF (heart failure) ([place]) 12/05/2016  . Symptomatic anemia 12/04/2016  . Hyperkalemia 12/04/2016  . Degenerative disc disease, lumbar 11/05/2016  . Dyspnea, unspecified 04/10/2016  . Chronic renal insufficiency, stage IV (severe) ([place]) 11/08/2015  . Chest pain with moderate risk of acute coronary syndrome 11/08/2015  . 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  Chronic diastolic congestive heart failure ([place])   . Essential hypertension     Past Surgical History:  Procedure Laterality Date  . APPENDECTOMY  1951  . CATARACT EXTRACTION, BILATERAL    . CHOLECYSTECTOMY    . GLAUCOMA SURGERY  10/29/2013  . hysterectomy - unknown type    . NECK MASS EXCISION     non cancerous  . TONSILLECTOMY      OB History    No data available       Home Medications    Prior to Admission medications   Medication Sig Start Date End Date Taking? Authorizing Provider  acetaminophen (TYLENOL) 500 MG tablet Take 1,000 mg by mouth 2 (two) times daily.     [provider]  brinzolamide (AZOPT) 1 % ophthalmic suspension Place 1 drop into the right eye 2 (two) times daily.     [provider]  bumetanide (BUMEX) 1 MG tablet Take 1 tablet (1 mg total) by mouth every morning and take 0.5 mg tablet (0.5 mg total) by mouth every evening. 06/13/16   [provider]  carvedilol (COREG) 12.5 MG tablet Take 1 tablet (12.5 mg total) by mouth 2 (two) times daily with a meal. 08/03/10   Wellington Hampshire, MD  carvedilol (COREG) 6.25 MG tablet Take 1 tablet (6.25 mg total) by mouth 2 (two) times daily. Take with 12.5 mg to make 18.75mg  02/19/17   Minus Breeding, MD  cyanocobalamin 1000 MCG tablet Take 1,000 mcg by mouth daily.  [provider]  dicyclomine (BENTYL) 10 MG capsule Take 10 mg by mouth 3 (three) times daily before meals.     [provider]  diphenoxylate-atropine (LOMOTIL) 2.5-0.025 MG tablet Take 1 tablet by mouth 3 (three) times daily. 09/06/16   [provider]  ferrous sulfate 325 (65 FE) MG tablet Take 325 mg by mouth 2 (two) times daily with a meal.     [provider]  fluticasone (FLONASE) 50 MCG/ACT nasal spray Place 2 sprays into both nostrils 2 (two) times daily.    [provider]  hydrALAZINE (APRESOLINE) 50 MG tablet Take 1 tablet (50 mg total) by mouth 3 (three) times daily.  12/07/16   Danford, Suann Larry, MD  Insulin Glargine (LANTUS SOLOSTAR) 100 UNIT/ML Solostar Pen Inject 2 Units into the skin daily as needed. B.S over 200 Sliding scale    [provider]  isosorbide mononitrate (IMDUR) 120 MG 24 hr tablet Take 2 tablets (240 mg total) by mouth daily. 05/03/16   Minus Breeding, MD  latanoprost (XALATAN) 0.005 % ophthalmic solution Place 1 drop into the right eye at bedtime.  01/20/13   [provider]  Liniments (SALONPAS ARTHRITIS PAIN RELIEF EX) Apply 1 patch topically every 12 (twelve) hours.    [provider]  losartan (COZAAR) 50 MG tablet Take 50 mg by mouth at bedtime.     [provider]  Multiple Vitamins-Minerals (EYE VITAMINS PO) Take by mouth 2 (two) times daily.    [provider]  nitroGLYCERIN (NITROSTAT) 0.4 MG SL tablet Place 1 tablet (0.4 mg total) under the tongue every 5 (five) minutes as needed for chest pain. 03/22/15   Minus Breeding, MD  omeprazole (PRILOSEC) 20 MG capsule Take 20 mg by mouth daily.    [provider]  timolol (TIMOPTIC) 0.5 % ophthalmic solution Place 1 drop into the right eye 2 (two) times daily.  01/14/12   [provider]  traMADol (ULTRAM) 50 MG tablet Take 1 tablet (50 mg total) at bedtime as needed by mouth. 11/26/16   Lyndal Pulley, DO  traZODone (DESYREL) 50 MG tablet Take 75 mg by mouth at bedtime. 09/28/16   [provider]  vitamin C (ASCORBIC ACID) 500 MG tablet Take 500 mg by mouth 2 (two) times daily.     [provider]    Family History Family History  Problem Relation Age of Onset  . Diabetes Unknown   . Hypertension Unknown   . Stroke Unknown   . Asthma Mother   . Allergic rhinitis Mother   . Congestive Heart Failure Mother   . Stroke Mother     Social History Social History   Tobacco Use  . Smoking status: Never Smoker  . Smokeless tobacco: Never Used  Substance Use Topics  . Alcohol use: No     Alcohol/week: 0.0 oz  . Drug use: No     Allergies   Actos [pioglitazone hydrochloride]; Felodipine er; Hctz [hydrochlorothiazide]; Lisinopril; Penicillins; Plendil [felodipine]; Amlodipine besylate; Brimonidine tartrate; Clonidine derivatives; Combigan [brimonidine tartrate-timolol]; Dorzolamide; Monopril [fosinopril sodium]; and Norvasc [amlodipine besylate]   Review of Systems Review of Systems  ROS reviewed and all otherwise negative except for mentioned in HPI   Physical Exam Updated Vital Signs BP (!) 126/54   Pulse (!) 58   Temp 98.6 F (37 C) (Oral)   Resp 14   Ht 5' (1.524 m)   Wt 65.3 kg (144 lb)   SpO2 100%   BMI 28.12  kg/m  Vitals reviewed Physical Exam  Physical Examination: General appearance - alert, well appearing, and in no distress Mental status - alert, oriented to person, place, and time Eyes - no conjunctival injection, no scleral icterus Mouth - mucous membranes moist, pharynx normal without lesions Neck - supple, no significant adenopathy Chest - clear to auscultation, no wheezes, rales or rhonchi, symmetric air entry Heart - normal rate, regular rhythm, normal S1, S2, no murmurs, rubs, clicks or gallops Abdomen - soft, nontender, nondistended, no masses or organomegaly Neurological - alert, oriented, normal speech Extremities - peripheral pulses normal, no pedal edema, no clubbing or cyanosis Skin - normal coloration and turgor, no rashes   ED Treatments / Results  Labs (all labs ordered are listed, but only abnormal results are displayed) Labs Reviewed  CBC WITH DIFFERENTIAL/PLATELET - Abnormal; Notable for the following components:      Result Value   RBC 2.70 (*)    Hemoglobin 8.8 (*)    HCT 27.4 (*)    MCV 101.5 (*)    Platelets 125 (*)    All other components within normal limits  COMPREHENSIVE METABOLIC PANEL - Abnormal; Notable for the following components:   Sodium 132 (*)    Potassium 6.2 (*)    CO2 18 (*)    Glucose, Bld 111  (*)    BUN 109 (*)    Creatinine, Ser 3.90 (*)    Total Protein 5.5 (*)    Albumin 3.2 (*)    AST 13 (*)    GFR calc non Af Amer 9 (*)    GFR calc Af Amer 11 (*)    All other components within normal limits  URINALYSIS, ROUTINE W REFLEX MICROSCOPIC - Abnormal; Notable for the following components:   Color, Urine STRAW (*)    Leukocytes, UA SMALL (*)    Squamous Epithelial / LPF 0-5 (*)    All other components within normal limits  URINE CULTURE  LIPASE, BLOOD  I-STAT CG4 LACTIC ACID, ED    EKG  EKG Interpretation  Date/Time:  Tuesday March 26 2017 14:42:10 EDT Ventricular Rate:  55 PR Interval:  224 QRS Duration: 122 QT Interval:  420 QTC Calculation: 401 R Axis:   -36 Text Interpretation:  Sinus bradycardia with 1st degree A-V block Possible Left atrial enlargement Left axis deviation Left ventricular hypertrophy with QRS widening Cannot rule out Septal infarct , age undetermined Abnormal ECG No old tracing to compare Confirmed by Alfonzo Beers [phone]) on 03/26/2017 4:51:20 PM       Radiology Ct Abdomen Pelvis Wo Contrast  Result Date: 03/26/2017 CLINICAL DATA:  Mid abdominal pain and vomiting for 3 days. History of diabetes, hypertension, hysterectomy, cholecystectomy and appendectomy. EXAM: CT ABDOMEN AND PELVIS WITHOUT CONTRAST TECHNIQUE: Multidetector CT imaging of the abdomen and pelvis was performed following the standard protocol without IV contrast. COMPARISON:  None. FINDINGS: LOWER CHEST: Multiple sub solid bibasilar pulmonary nodules measuring to 4 mm. Imaged heart is upper limits of normal in size. Mild coronary artery calcifications, mitral annular calcifications. No pericardial effusion. HEPATOBILIARY: Status post cholecystectomy. Negative noncontrast CT liver. PANCREAS: Atrophic, nonacute. SPLEEN: Normal. Inter splenic calcified granuloma versus vascular calcification. ADRENALS/URINARY TRACT: Kidneys are orthotopic, moderate symmetric atrophy. No nephrolithiasis,  hydronephrosis; limited assessment for renal masses on this nonenhanced examination. The unopacified ureters are normal in course and caliber. Urinary bladder is well distended and unremarkable. Normal adrenal glands. STOMACH/BOWEL: The stomach, small and large bowel are normal in course and caliber without inflammatory  changes, sensitivity decreased by lack of enteric contrast. 8 mm ovoid calcification dependent in gastric antrum. Moderate volume retained large bowel stool. Mild colonic diverticulosis. Moderate air and fluid containing duodenal diverticulum. VASCULAR/LYMPHATIC: Aortoiliac vessels are normal in course and caliber. Moderate calcific atherosclerosis. No lymphadenopathy by CT size criteria. REPRODUCTIVE: Status post hysterectomy. OTHER: No intraperitoneal free fluid or free air. MUSCULOSKELETAL: Non-acute. Osteopenia. Levoscoliosis and severe degenerative change of lumbar spine. Thickened T12 trabecular with small associated lucency seen with Paget's disease or hemangioma. Focal lucency LEFT humeral head equivocal for avascular necrosis without collapse. Anterior abdominal wall ligamentous laxity and scarring. IMPRESSION: 1. Moderate volume retained large bowel stool without bowel obstruction nor acute intra-abdominal/pelvic process. 2. Moderately atrophic kidneys without nephrolithiasis or obstructive uropathy. 3. **An incidental finding of potential clinical significance has been found. Multiple bibasilar sub solid pulmonary nodules measuring less than 6 mm. Recommend follow-up CT chest at 3-6 months. This recommendation follows the consensus statement: Guidelines for Management of Small Pulmonary Nodules Detected on CT Images: From the Fleischner Society 2017; Radiology 2017; 284:228-243.** Aortic Atherosclerosis (ICD10-I70.0). Electronically Signed   By: Elon Alas M.D.   On: 03/26/2017 16:19    Procedures Procedures (including critical care time)  Medications Ordered in  ED Medications  insulin aspart (novoLOG) injection 10 Units (not administered)  dextrose 50 % solution 50 mL (not administered)  sodium polystyrene (KAYEXALATE) 15 GM/60ML suspension 30 g (not administered)  ondansetron (ZOFRAN-ODT) disintegrating tablet 8 mg (8 mg Oral Given 03/26/17 1458)  sodium chloride 0.9 % bolus 500 mL (0 mLs Intravenous Stopped 03/26/17 1603)  sodium chloride 0.9 % bolus 1,000 mL (1,000 mLs Intravenous New Bag/Given 03/26/17 1733)   CRITICAL CARE Performed by: Marcha Dutton, Yobani Schertzer L Total critical care time:40 minutes Critical care time was exclusive of separately billable procedures and treating other patients. Critical care was necessary to treat or prevent imminent or life-threatening deterioration. Critical care was time spent personally by me on the following activities: development of treatment plan with patient and/or surrogate as well as nursing, discussions with consultants, evaluation of patient's response to treatment, examination of patient, obtaining history from patient or surrogate, ordering and performing treatments and interventions, ordering and review of laboratory studies, ordering and review of radiographic studies, pulse oximetry and re-evaluation of patient's condition.  Initial Impression / Assessment and Plan / ED Course  I have reviewed the triage vital signs and the nursing notes.  Pertinent labs & imaging results that were available during my care of the patient were reviewed by me and considered in my medical decision making (see chart for details).    6:35 PM  D/w Dr. Jonnie Finner, triad for admission.  He will see patient in the ED.    Patient presenting with nausea and vomiting.  Her creatinine has increased from a baseline of 1.6 up to 3.9 with BUN elevated at 109.  Her potassium was also elevated at 6.2.  She has no EKG changes.  Insulin and glucose and Kayexalate were given.  IV fluids were initiated.  Patient will be admitted to hospitalist service  for further management.  Final Clinical Impressions(s) / ED Diagnoses   Final diagnoses:  Acute renal failure superimposed on chronic kidney disease, unspecified CKD stage, unspecified acute renal failure type ([place])  Dehydration  Non-intractable vomiting with nausea, unspecified vomiting type    ED Discharge Orders    None       Pixie Casino, MD 03/26/17 2042

## 2017-03-26 NOTE — ED Notes (Signed)
Pt refused Purewick so she ambulated to restroom with walker and one assist. Complained of having numbness in her hands by saying "I can barely feel my fingers." This tech could also hear the pt wheezing while ambulating.

## 2017-03-26 NOTE — ED Triage Notes (Signed)
Pt c/o "feeling bad on Sunday, started vomiting yesterday x 3-- brown liquid, -- has hx of dehydration, no diarrhea. Had normal BM yesterday==after drinking prune juice.  Pt lives with husband and has a caregiver.  C/o pain on right hip-- has been seen for same- has spot on hip per son and has been taking an antibiotic for same-

## 2017-03-26 NOTE — ED Notes (Signed)
Patient is stable and ready to be transport to the floor at this time.  Report was called to 50M RN.  Belongings taken with the patient to the floor.    Patient's diet changed to NPO per admitting MD, no food given to patient by this RN

## 2017-03-27 ENCOUNTER — Encounter (HOSPITAL_COMMUNITY): Payer: Self-pay

## 2017-03-27 DIAGNOSIS — E872 Acidosis: Secondary | ICD-10-CM

## 2017-03-27 LAB — GLUCOSE, CAPILLARY
GLUCOSE-CAPILLARY: 110 mg/dL — AB (ref 65–99)
GLUCOSE-CAPILLARY: 97 mg/dL (ref 65–99)
GLUCOSE-CAPILLARY: 97 mg/dL (ref 65–99)
Glucose-Capillary: 120 mg/dL — ABNORMAL HIGH (ref 65–99)
Glucose-Capillary: 80 mg/dL (ref 65–99)
Glucose-Capillary: 180 mg/dL — ABNORMAL HIGH (ref 65–99)

## 2017-03-27 LAB — COMPREHENSIVE METABOLIC PANEL
ALBUMIN: 2.8 g/dL — AB (ref 3.5–5.0)
ALT: 16 U/L (ref 14–54)
ANION GAP: 10 (ref 5–15)
AST: 14 U/L — AB (ref 15–41)
Alkaline Phosphatase: 79 U/L (ref 38–126)
BILIRUBIN TOTAL: 0.5 mg/dL (ref 0.3–1.2)
BUN: 104 mg/dL — ABNORMAL HIGH (ref 6–20)
CHLORIDE: 106 mmol/L (ref 101–111)
CO2: 18 mmol/L — ABNORMAL LOW (ref 22–32)
Calcium: 8.9 mg/dL (ref 8.9–10.3)
Creatinine, Ser: 3.62 mg/dL — ABNORMAL HIGH (ref 0.44–1.00)
GFR calc Af Amer: 12 mL/min — ABNORMAL LOW (ref >60)
GFR calc non Af Amer: 10 mL/min — ABNORMAL LOW (ref >60)
GLUCOSE: 103 mg/dL — AB (ref 65–99)
POTASSIUM: 6.3 mmol/L — AB (ref 3.5–5.1)
Sodium: 134 mmol/L — ABNORMAL LOW (ref 135–145)
TOTAL PROTEIN: 4.7 g/dL — AB (ref 6.5–8.1)

## 2017-03-27 LAB — MAGNESIUM: Magnesium: 2.2 mg/dL (ref 1.7–2.4)

## 2017-03-27 LAB — BASIC METABOLIC PANEL
ANION GAP: 10 (ref 5–15)
BUN: 101 mg/dL — ABNORMAL HIGH (ref 6–20)
CO2: 19 mmol/L — AB (ref 22–32)
Calcium: 9 mg/dL (ref 8.9–10.3)
Chloride: 110 mmol/L (ref 101–111)
Creatinine, Ser: 3.55 mg/dL — ABNORMAL HIGH (ref 0.44–1.00)
GFR calc Af Amer: 12 mL/min — ABNORMAL LOW (ref >60)
GFR calc non Af Amer: 10 mL/min — ABNORMAL LOW (ref >60)
GLUCOSE: 203 mg/dL — AB (ref 65–99)
POTASSIUM: 5.1 mmol/L (ref 3.5–5.1)
Sodium: 139 mmol/L (ref 135–145)

## 2017-03-27 LAB — PHOSPHORUS: Phosphorus: 5.1 mg/dL — ABNORMAL HIGH (ref 2.5–4.6)

## 2017-03-27 MED ORDER — SODIUM POLYSTYRENE SULFONATE PO POWD: 60.0000 g | ORAL | Status: AC

## 2017-03-27 MED ORDER — SODIUM POLYSTYRENE SULFONATE 15 GM/60ML PO SUSP
60.0000 g | ORAL | Status: DC
  Administered 2017-03-27: 60 g via ORAL
  Filled 2017-03-27 (×2): qty 240

## 2017-03-27 MED ORDER — SODIUM CHLORIDE 0.9 % IV SOLN
INTRAVENOUS | Status: AC
  Administered 2017-03-27: 12:00:00 via INTRAVENOUS

## 2017-03-27 MED ORDER — WHITE PETROLATUM EX OINT
TOPICAL_OINTMENT | CUTANEOUS | Status: AC
  Administered 2017-03-27: 01:00:00
  Filled 2017-03-27: qty 28.35

## 2017-03-27 NOTE — Progress Notes (Signed)
PT Cancellation Note  Patient Details Name: Kelly Ruiz MRN: [id number] DOB: 01/30/1923   Cancelled Treatment:    Reason Eval/Treat Not Completed: Patient declined, no reason specified.  Pt being assisted with pericare both times therapist arrived for the evaluation.  Will see pt 3/14 as able. 03/27/2017  Donnella Sham, [place] [phone]  (pager)   Tessie Fass Ahliya Glatt 03/27/2017, 10:55 PM

## 2017-03-27 NOTE — Progress Notes (Addendum)
[facility] PROGRESS NOTE  Kelly Ruiz QBV:[id number] DOB: 1923-07-20 DOA: 03/26/2017  PCP: Ronita Hipps, MD  Brief History/Interval Summary: 84 year old Caucasian female with a past medical history of chronic diastolic CHF, chronic kidney disease stage IV, insulin-dependent diabetes mellitus type 2, coronary artery disease, hypertension, history of diverticular bleed with chronic iron deficiency anemia presented with complains of fatigue, decreased oral intake and nausea vomiting for 2-3 days.  Denied any diarrhea.  Patient was found to have acute renal failure with hyperkalemia.  She was hospitalized for further management.  Reason for Visit: Acute renal failure with hyperkalemia.  Consultants: None yet  Procedures: None  Antibiotics: None  Subjective/Interval History: Patient feels very anxious.  Mentions some shortness of breath but denies any cough.  No chest pain.  Denies any nausea vomiting.  No diarrhea.  No abdominal pain.  ROS: No headaches  Objective:  Vital Signs  Vitals:   03/26/17 1901 03/26/17 1945 03/27/17 0416 03/27/17 0937  BP: 120/85 (!) 104/59 110/78 (!) 120/43  Pulse: 60 (!) 56 65 61  Resp: 18 17 19 18   Temp:   98.4 F (36.9 C) 99.1 F (37.3 C)  TempSrc:   Oral Oral  SpO2: 100% 99% 99% 100%  Weight:      Height:        Intake/Output Summary (Last 24 hours) at 03/27/2017 1057 Last data filed at 03/27/2017 0600 Gross per 24 hour  Intake 2069.25 ml  Output 950 ml  Net 1119.25 ml   Filed Weights   03/26/17 1344  Weight: 65.3 kg (144 lb)    General appearance: alert, cooperative and no distress Head: Normocephalic, without obvious abnormality, atraumatic Resp: Diminished air entry at the bases but no crackles appreciated.  No wheezing.  Normal effort. Cardio: regular rate and rhythm, S1, S2 normal, no murmur, click, rub or gallop GI: soft, non-tender; bowel sounds normal; no masses,  no organomegaly Extremities: extremities normal,  atraumatic, no cyanosis or edema Neurologic: Awake alert.  Anxious.  No obvious focal neurological deficits.  Lab Results:  Data Reviewed: I have personally reviewed following labs and imaging studies  CBC: Recent Labs  Lab 03/26/17 1355  WBC 5.9  NEUTROABS 4.6  HGB 8.8*  HCT 27.4*  MCV 101.5*  PLT 125*    Basic Metabolic Panel: Recent Labs  Lab 03/26/17 1355 03/26/17 1900 03/26/17 2022 03/27/17 0432  NA 132*  --  133* 134*  K 6.2*  --  5.8* 6.3*  CL 103  --  104 106  CO2 18*  --  18* 18*  GLUCOSE 111*  --  76 103*  BUN 109*  --  108* 104*  CREATININE 3.90*  --  3.78* 3.62*  CALCIUM 9.0  --  8.7* 8.9  MG  --  2.3  --  2.2  PHOS  --  5.2*  --  5.1*    GFR: Estimated Creatinine Clearance: 8.5 mL/min (A) (by C-G formula based on SCr of 3.62 mg/dL (H)).  Liver Function Tests: Recent Labs  Lab 03/26/17 1355 03/27/17 0432  AST 13* 14*  ALT 18 16  ALKPHOS 82 79  BILITOT 0.7 0.5  PROT 5.5* 4.7*  ALBUMIN 3.2* 2.8*    Recent Labs  Lab 03/26/17 1355  LIPASE 20    HbA1C: Recent Labs    03/26/17 1355  HGBA1C 5.5    CBG: Recent Labs  Lab 03/26/17 2106 03/27/17 0004 03/27/17 0415 03/27/17 0736  GLUCAP 56* 97 120* 80  Radiology Studies: Ct Abdomen Pelvis Wo Contrast  Result Date: 03/26/2017 CLINICAL DATA:  Mid abdominal pain and vomiting for 3 days. History of diabetes, hypertension, hysterectomy, cholecystectomy and appendectomy. EXAM: CT ABDOMEN AND PELVIS WITHOUT CONTRAST TECHNIQUE: Multidetector CT imaging of the abdomen and pelvis was performed following the standard protocol without IV contrast. COMPARISON:  None. FINDINGS: LOWER CHEST: Multiple sub solid bibasilar pulmonary nodules measuring to 4 mm. Imaged heart is upper limits of normal in size. Mild coronary artery calcifications, mitral annular calcifications. No pericardial effusion. HEPATOBILIARY: Status post cholecystectomy. Negative noncontrast CT liver. PANCREAS: Atrophic,  nonacute. SPLEEN: Normal. Inter splenic calcified granuloma versus vascular calcification. ADRENALS/URINARY TRACT: Kidneys are orthotopic, moderate symmetric atrophy. No nephrolithiasis, hydronephrosis; limited assessment for renal masses on this nonenhanced examination. The unopacified ureters are normal in course and caliber. Urinary bladder is well distended and unremarkable. Normal adrenal glands. STOMACH/BOWEL: The stomach, small and large bowel are normal in course and caliber without inflammatory changes, sensitivity decreased by lack of enteric contrast. 8 mm ovoid calcification dependent in gastric antrum. Moderate volume retained large bowel stool. Mild colonic diverticulosis. Moderate air and fluid containing duodenal diverticulum. VASCULAR/LYMPHATIC: Aortoiliac vessels are normal in course and caliber. Moderate calcific atherosclerosis. No lymphadenopathy by CT size criteria. REPRODUCTIVE: Status post hysterectomy. OTHER: No intraperitoneal free fluid or free air. MUSCULOSKELETAL: Non-acute. Osteopenia. Levoscoliosis and severe degenerative change of lumbar spine. Thickened T12 trabecular with small associated lucency seen with Paget's disease or hemangioma. Focal lucency LEFT humeral head equivocal for avascular necrosis without collapse. Anterior abdominal wall ligamentous laxity and scarring. IMPRESSION: 1. Moderate volume retained large bowel stool without bowel obstruction nor acute intra-abdominal/pelvic process. 2. Moderately atrophic kidneys without nephrolithiasis or obstructive uropathy. 3. **An incidental finding of potential clinical significance has been found. Multiple bibasilar sub solid pulmonary nodules measuring less than 6 mm. Recommend follow-up CT chest at 3-6 months. This recommendation follows the consensus statement: Guidelines for Management of Small Pulmonary Nodules Detected on CT Images: From the Fleischner Society 2017; Radiology 2017; 284:228-243.** Aortic Atherosclerosis  (ICD10-I70.0). Electronically Signed   By: Elon Alas M.D.   On: 03/26/2017 16:19     Medications:  Scheduled: . brinzolamide  1 drop Right Eye BID  . carvedilol  6.25 mg Oral BID WC  . dicyclomine  10 mg Oral TID AC  . docusate sodium  100 mg Oral BID  . ferrous sulfate  325 mg Oral Q breakfast  . heparin  5,000 Units Subcutaneous Q8H  . hydrALAZINE  50 mg Oral TID  . insulin aspart  0-9 Units Subcutaneous TID WC  . latanoprost  1 drop Right Eye QHS  . pantoprazole  40 mg Oral Daily  . senna  1 tablet Oral BID  . sodium chloride flush  3 mL Intravenous Q12H  . sodium polystyrene  60 g Oral Q4H  . timolol  1 drop Right Eye BID   Continuous: . sodium chloride     OXB:[id number] **OR** acetaminophen, ondansetron **OR** ondansetron (ZOFRAN) IV  Assessment/Plan:  Active Problems:   AKI (acute kidney injury) ([place])    Acute renal failure on chronic kidney disease stage IV/non-anion gap metabolic acidosis/hyperkalemia Patient's creatinine was 1.63 and BUN was 53 and November 2018.  On admission it was 3.9 and BUN was 109.  She did have some nausea vomiting over the last few days.  Most likely this is prerenal.  Plus she was on diuretics and ARB at home.  Patient does not appear to have fluid overload.  She actually seems dehydrated.  She was started on IV fluids with some improvement in her creatinine but not significantly so.  She is making urine.  Potassium did improve with Kayexalate, calcium gluconate and dextrose/insulin. But it has increased again.  We will give her higher doses of Kayexalate and repeat potassium levels this afternoon.  If potassium remains persistently elevated we may need to consult nephrology.  Noncontrast CT scan did not show any structural issues in her GU tract.  Monitor urine output closely.  Monitor bicarbonate closely.  Nausea and vomiting Etiology unclear.  CT scan of the abdomen and pelvis did not show any obvious abnormalities.  She has not  had any vomiting up on the floor yet.  Nausea and vomiting could be due to uremia.  Continue to watch for now.  History of coronary artery disease, chronic diastolic CHF, hypertension These issues appear to be stable.  Continue with home medications.  Holding her diuretics including Bumex.  Also holding her losartan due to worsening renal function.  Insulin-dependent diabetes mellitus with polyneuropathy She has been off of glycemic medications at home due to advanced age.  Continue with SSI.  Monitor CBGs.  Chronic microcytic anemia Hemoglobin close to baseline.  No evidence of overt bleeding.  Anticipate some dilutional drop.  DVT Prophylaxis: Subcutaneous heparin    Code Status: Full code Family Communication: No family at bedside.  Discussed with her son over the phone. Disposition Plan: Management as outlined above.     LOS: 1 day   [place] [facility] Pager [phone] 03/27/2017, 10:57 AM  If 7PM-7AM, please contact night-coverage at [email or web address], password [facility]

## 2017-03-28 LAB — BASIC METABOLIC PANEL
Anion gap: 10 (ref 5–15)
BUN: 89 mg/dL — ABNORMAL HIGH (ref 6–20)
CALCIUM: 8.7 mg/dL — AB (ref 8.9–10.3)
CO2: 20 mmol/L — AB (ref 22–32)
CREATININE: 2.66 mg/dL — AB (ref 0.44–1.00)
Chloride: 113 mmol/L — ABNORMAL HIGH (ref 101–111)
GFR calc non Af Amer: 15 mL/min — ABNORMAL LOW (ref >60)
GFR, EST AFRICAN AMERICAN: 17 mL/min — AB (ref >60)
GLUCOSE: 101 mg/dL — AB (ref 65–99)
Potassium: 3.8 mmol/L (ref 3.5–5.1)
Sodium: 143 mmol/L (ref 135–145)

## 2017-03-28 LAB — URINE CULTURE: Culture: 50000 — AB

## 2017-03-28 LAB — GLUCOSE, CAPILLARY
GLUCOSE-CAPILLARY: 113 mg/dL — AB (ref 65–99)
GLUCOSE-CAPILLARY: 93 mg/dL (ref 65–99)
GLUCOSE-CAPILLARY: 97 mg/dL (ref 65–99)
Glucose-Capillary: 128 mg/dL — ABNORMAL HIGH (ref 65–99)
Glucose-Capillary: 83 mg/dL (ref 65–99)

## 2017-03-28 LAB — CBC
HEMATOCRIT: 25.9 % — AB (ref 36.0–46.0)
Hemoglobin: 8.2 g/dL — ABNORMAL LOW (ref 12.0–15.0)
MCH: 32.4 pg (ref 26.0–34.0)
MCHC: 31.7 g/dL (ref 30.0–36.0)
MCV: 102.4 fL — AB (ref 78.0–100.0)
Platelets: 135 10*3/uL — ABNORMAL LOW (ref 150–400)
RBC: 2.53 MIL/uL — ABNORMAL LOW (ref 3.87–5.11)
RDW: 14 % (ref 11.5–15.5)
WBC: 7.6 10*3/uL (ref 4.0–10.5)

## 2017-03-28 LAB — UREA NITROGEN, URINE: UREA NITROGEN UR: 303 mg/dL

## 2017-03-28 MED ORDER — SALINE SPRAY 0.65 % NA SOLN
1.0000 | NASAL | Status: DC | PRN
  Administered 2017-03-28 – 2017-03-31 (×2): 1 via NASAL
  Filled 2017-03-28: qty 44

## 2017-03-28 MED ORDER — GABAPENTIN 100 MG PO CAPS
100.0000 mg | ORAL_CAPSULE | Freq: Once | ORAL | Status: AC
  Administered 2017-03-28: 100 mg via ORAL
  Filled 2017-03-28: qty 1

## 2017-03-28 MED ORDER — SODIUM CHLORIDE 0.9 % IV SOLN
INTRAVENOUS | Status: DC
  Administered 2017-03-28: 11:00:00 via INTRAVENOUS

## 2017-03-28 MED ORDER — FLUTICASONE PROPIONATE 50 MCG/ACT NA SUSP
1.0000 | Freq: Every day | NASAL | Status: DC
  Administered 2017-03-28 – 2017-04-04 (×8): 1 via NASAL
  Filled 2017-03-28: qty 16

## 2017-03-28 MED ORDER — FOSFOMYCIN TROMETHAMINE 3 G PO PACK
3.0000 g | PACK | Freq: Once | ORAL | Status: AC
  Administered 2017-03-28: 3 g via ORAL
  Filled 2017-03-28: qty 3

## 2017-03-28 NOTE — Progress Notes (Signed)
PT Cancellation Note  Patient Details Name: Kelly Ruiz MRN: [id number] DOB: [birth year]   Cancelled Treatment:    Reason Eval/Treat Not Completed: Other (comment).  PT entered room and her family member stated she has been asleep all day after some medication last night.  Pt was able to open her eyes upon hearing her name and then immediately back to sleep.  Nursing in to check on her and will try again as pt is able to tolerate evaluation.   Ramond Dial 03/28/2017, 1:11 PM   Mee Hives, PT MS Acute Rehab Dept. Number: [place]

## 2017-03-28 NOTE — Progress Notes (Addendum)
[facility] PROGRESS NOTE  Kelly Ruiz GEX:[id number] DOB: July 30, 1923 DOA: 03/26/2017  PCP: Ronita Hipps, MD  Brief History/Interval Summary: 84 year old Caucasian female with a past medical history of chronic diastolic CHF, chronic kidney disease stage IV, insulin-dependent diabetes mellitus type 2, coronary artery disease, hypertension, history of diverticular bleed with chronic iron deficiency anemia presented with complains of fatigue, decreased oral intake and nausea vomiting for 2-3 days.  Denied any diarrhea.  Patient was found to have acute renal failure with hyperkalemia.  She was hospitalized for further management.  Reason for Visit: Acute renal failure with hyperkalemia.  Consultants: None yet  Procedures: None  Antibiotics: None  Subjective/Interval History: No acute complaint no nausea no vomiting.  Last night complain about some neuropathy and was given gabapentin and then remains sleepy throughout the day.  ROS: No headaches  Objective:  Vital Signs  Vitals:   03/27/17 2019 03/27/17 2216 03/28/17 0434 03/28/17 0959  BP: (!) 136/26 (!) 160/47 (!) 158/40 (!) 145/48  Pulse:   80 63  Resp:   19 18  Temp:   98.4 F (36.9 C)   TempSrc:   Oral   SpO2:   100% 99%  Weight:      Height:        Intake/Output Summary (Last 24 hours) at 03/28/2017 1611 Last data filed at 03/28/2017 1423 Gross per 24 hour  Intake 240 ml  Output 1125 ml  Net -885 ml   Filed Weights   03/26/17 1344  Weight: 65.3 kg (144 lb)    General appearance: alert, cooperative and no distress Head: Normocephalic, without obvious abnormality, atraumatic Resp: Diminished air entry at the bases but no crackles appreciated.  No wheezing.  Normal effort. Cardio: regular rate and rhythm, S1, S2 normal, no murmur, click, rub or gallop GI: soft, non-tender; bowel sounds normal; no masses,  no organomegaly Extremities: extremities normal, atraumatic, no cyanosis or edema Neurologic:  Awake alert.  Anxious.  No obvious focal neurological deficits.  Lab Results:  Data Reviewed: I have personally reviewed following labs and imaging studies  CBC: Recent Labs  Lab 03/26/17 1355 03/28/17 0706  WBC 5.9 7.6  NEUTROABS 4.6  --   HGB 8.8* 8.2*  HCT 27.4* 25.9*  MCV 101.5* 102.4*  PLT 125* 135*    Basic Metabolic Panel: Recent Labs  Lab 03/26/17 1355 03/26/17 1900 03/26/17 2022 03/27/17 0432 03/27/17 1239 03/28/17 0706  NA 132*  --  133* 134* 139 143  K 6.2*  --  5.8* 6.3* 5.1 3.8  CL 103  --  104 106 110 113*  CO2 18*  --  18* 18* 19* 20*  GLUCOSE 111*  --  76 103* 203* 101*  BUN 109*  --  108* 104* 101* 89*  CREATININE 3.90*  --  3.78* 3.62* 3.55* 2.66*  CALCIUM 9.0  --  8.7* 8.9 9.0 8.7*  MG  --  2.3  --  2.2  --   --   PHOS  --  5.2*  --  5.1*  --   --     GFR: Estimated Creatinine Clearance: 11.6 mL/min (A) (by C-G formula based on SCr of 2.66 mg/dL (H)).  Liver Function Tests: Recent Labs  Lab 03/26/17 1355 03/27/17 0432  AST 13* 14*  ALT 18 16  ALKPHOS 82 79  BILITOT 0.7 0.5  PROT 5.5* 4.7*  ALBUMIN 3.2* 2.8*    Recent Labs  Lab 03/26/17 1355  LIPASE 20    HbA1C: Recent  Labs    03/26/17 1355  HGBA1C 5.5    CBG: Recent Labs  Lab 03/27/17 1654 03/27/17 1959 03/28/17 0008 03/28/17 0759 03/28/17 1145  GLUCAP 97 110* 97 93 128*      Radiology Studies: No results found.   Medications:  Scheduled: . brinzolamide  1 drop Right Eye BID  . carvedilol  6.25 mg Oral BID WC  . docusate sodium  100 mg Oral BID  . ferrous sulfate  325 mg Oral Q breakfast  . fluticasone  1 spray Each Nare Daily  . heparin  5,000 Units Subcutaneous Q8H  . hydrALAZINE  50 mg Oral TID  . insulin aspart  0-9 Units Subcutaneous TID WC  . latanoprost  1 drop Right Eye QHS  . pantoprazole  40 mg Oral Daily  . senna  1 tablet Oral BID  . sodium chloride flush  3 mL Intravenous Q12H  . timolol  1 drop Right Eye BID   Continuous: . sodium  chloride 50 mL/hr at 03/28/17 1200   UJW:[id number] **OR** acetaminophen, ondansetron **OR** ondansetron (ZOFRAN) IV, sodium chloride  Assessment/Plan:  Active Problems:   AKI (acute kidney injury) ([place])    Acute renal failure on chronic kidney disease stage IV/non-anion gap metabolic acidosis/hyperkalemia Patient's creatinine was 1.63 and BUN was 53 and November 2018.  On admission it was 3.9 and BUN was 109.  She did have some nausea vomiting over the last few days.  Most likely this is prerenal.  Plus she was on diuretics and ARB at home.  Patient does not appear to have fluid overload.  She actually seems dehydrated.  She was started on IV fluids with some improvement in her creatinine but not significantly so.  She is making urine.  Potassium did improve with Kayexalate, calcium gluconate and dextrose/insulin.  Noncontrast CT scan did not show any structural issues in her GU tract.  Monitor urine output closely.  Monitor bicarbonate closely. Continue gentle IV hydration avoid nephrotoxic medications  Nausea and vomiting Etiology unclear.  CT scan of the abdomen and pelvis did not show any obvious abnormalities.  She has not had any vomiting up on the floor yet.  Nausea and vomiting could be due to uremia.  Continue to watch for now.  History of coronary artery disease, chronic diastolic CHF, hypertension These issues appear to be stable.  Continue with home medications.  Holding her diuretics including Bumex.  Also holding her losartan due to worsening renal function.  Insulin-dependent diabetes mellitus with polyneuropathy She has been off of glycemic medications at home due to advanced age.  Continue with SSI.  Monitor CBGs.  Chronic microcytic anemia Hemoglobin close to baseline.  No evidence of overt bleeding.  Anticipate some dilutional drop.  Enterococcus UTI. Present on admission. Treat with fosfomycin x1.  Neuropathy. Acute encephalopathy. In response to gabapentin  patient received last night. Continue to avoid psychotropic medications in this patient.  DVT Prophylaxis: Subcutaneous heparin    Code Status: Full code Family Communication: No family at bedside.  Discussed with her son over the phone. Disposition Plan: Management as outlined above.     LOS: 2 days   [place] [facility] Pager [phone] 03/28/2017, 4:11 PM  If 7PM-7AM, please contact night-coverage at [email or web address], password [facility]

## 2017-03-29 ENCOUNTER — Inpatient Hospital Stay (HOSPITAL_COMMUNITY): Payer: Medicare Other

## 2017-03-29 LAB — MAGNESIUM: Magnesium: 2 mg/dL (ref 1.7–2.4)

## 2017-03-29 LAB — RENAL FUNCTION PANEL
ANION GAP: 7 (ref 5–15)
Albumin: 2.6 g/dL — ABNORMAL LOW (ref 3.5–5.0)
BUN: 75 mg/dL — ABNORMAL HIGH (ref 6–20)
CO2: 20 mmol/L — AB (ref 22–32)
Calcium: 8.6 mg/dL — ABNORMAL LOW (ref 8.9–10.3)
Chloride: 118 mmol/L — ABNORMAL HIGH (ref 101–111)
Creatinine, Ser: 2.2 mg/dL — ABNORMAL HIGH (ref 0.44–1.00)
GFR calc Af Amer: 21 mL/min — ABNORMAL LOW (ref >60)
GFR calc non Af Amer: 18 mL/min — ABNORMAL LOW (ref >60)
GLUCOSE: 108 mg/dL — AB (ref 65–99)
POTASSIUM: 3.7 mmol/L (ref 3.5–5.1)
Phosphorus: 4.1 mg/dL (ref 2.5–4.6)
Sodium: 145 mmol/L (ref 135–145)

## 2017-03-29 LAB — GLUCOSE, CAPILLARY
GLUCOSE-CAPILLARY: 120 mg/dL — AB (ref 65–99)
GLUCOSE-CAPILLARY: 164 mg/dL — AB (ref 65–99)
Glucose-Capillary: 112 mg/dL — ABNORMAL HIGH (ref 65–99)
Glucose-Capillary: 132 mg/dL — ABNORMAL HIGH (ref 65–99)

## 2017-03-29 MED ORDER — ISOSORBIDE MONONITRATE ER 60 MG PO TB24
240.0000 mg | ORAL_TABLET | Freq: Every day | ORAL | Status: DC
  Administered 2017-03-29 – 2017-04-04 (×7): 240 mg via ORAL
  Filled 2017-03-29 (×7): qty 4

## 2017-03-29 NOTE — Evaluation (Signed)
Physical Therapy Evaluation Patient Details Name: Kelly Ruiz MRN: [id number] DOB: 09-10-1923 Today's Date: 03/29/2017   History of Present Illness  84 year old Caucasian female with a past medical history of chronic diastolic CHF, chronic kidney disease stage IV, insulin-dependent diabetes mellitus type 2, coronary artery disease, hypertension, history of diverticular bleed with chronic iron deficiency anemia presented with complains of fatigue, decreased oral intake and nausea vomiting for 2-3 days.  Denied any diarrhea.  Patient was found to have acute renal failure with hyperkalemia.   Clinical Impression  Patient presents with decreased independence with mobility due to deficits listed in PT problem list.  She currently needs min A overall and should be able to be managed with caregiver assist at home with follow up HHPT.  PT to follow acutely and recommend nursing staff assist with mobility as well.    Follow Up Recommendations Home health PT;Supervision/Assistance - 24 hour    Equipment Recommendations  None recommended by PT    Recommendations for Other Services       Precautions / Restrictions Precautions Precautions: Fall Restrictions Weight Bearing Restrictions: No      Mobility  Bed Mobility Overal bed mobility: Needs Assistance Bed Mobility: Supine to Sit     Supine to sit: Min assist;Mod assist     General bed mobility comments: help lifting trunk and scooting to EOB  Transfers Overall transfer level: Needs assistance Equipment used: Rolling walker (2 wheeled) Transfers: Sit to/from Stand Sit to Stand: Min assist;+2 physical assistance         General transfer comment: lifting help from EOB   Ambulation/Gait Ambulation/Gait assistance: Min assist;+2 safety/equipment Ambulation Distance (Feet): 12 Feet Assistive device: Rolling walker (2 wheeled) Gait Pattern/deviations: Step-to pattern;Decreased stride length;Shuffle;Trunk flexed     General  Gait Details: slow pace stepping around bed with some c/o nausea. +2 for IV and managing O2 tubing  Stairs            Wheelchair Mobility    Modified Rankin (Stroke Patients Only)       Balance Overall balance assessment: Needs assistance   Sitting balance-Leahy Scale: Fair     Standing balance support: Bilateral upper extremity supported Standing balance-Leahy Scale: Poor Standing balance comment: UE support on walker in standing, pt felt weakness and unsteady                              Pertinent Vitals/Pain Pain Assessment: 0-10 Pain Score: 5  Pain Location: headache Pain Descriptors / Indicators: Aching Pain Intervention(s): Monitored during session;Repositioned;RN gave pain meds during session    [place] expects to be discharged to:: Private residence Living Arrangements: Spouse/significant other Available Help at Discharge: Family;Personal care attendant Type of Home: House Home Access: Stairs to enter Entrance Stairs-Rails: Psychiatric nurse of Steps: 3 Home Layout: Laundry or work area in basement;Two level;Able to live on main level with bedroom/bathroom Home Equipment: Grab bars - tub/shower;Grab bars - toilet;Tub bench;Walker - 4 wheels;Cane - single point;Bedside commode Additional Comments: CNA is there from 10 pm to 8 am, 8am to 1 pm, and from 5 pm to 10 pm. Daughter is there during the day.     Prior Function Level of Independence: Needs assistance   Gait / Transfers Assistance Needed: Used rollator for ambulation   ADL's / Homemaking Assistance Needed: Aides assist with bathing and dressing and IADLs.         Hand Dominance   Dominant  Hand: Right    Extremity/Trunk Assessment        Lower Extremity Assessment Lower Extremity Assessment: RLE deficits/detail;LLE deficits/detail RLE Deficits / Details: AROM WFL, strength hip flexion 3+/5, knee extension 4/5, ankle DF 4-/5 LLE Deficits /  Details: AROM WFL, strength hip flexion 3+/5, knee extension 4/5, ankle DF 4-/5    Cervical / Trunk Assessment Cervical / Trunk Assessment: Kyphotic  Communication   Communication: No difficulties  Cognition Arousal/Alertness: Awake/alert Behavior During Therapy: WFL for tasks assessed/performed Overall Cognitive Status: Within Functional Limits for tasks assessed                                        General Comments General comments (skin integrity, edema, etc.): son in room and reports she should have enough help at home, and that had bad experience at [facility] in [place] last admission so prefers home if able with HHPT.    Exercises     Assessment/Plan    PT Assessment Patient needs continued PT services  PT Problem List Decreased strength;Decreased activity tolerance;Decreased balance;Decreased knowledge of use of DME;Decreased mobility       PT Treatment Interventions DME instruction;Therapeutic activities;Gait training;Therapeutic exercise;Patient/family education;Balance training;Stair training;Functional mobility training    PT Goals (Current goals can be found in the Care Plan section)  Acute Rehab PT Goals Patient Stated Goal: To return home PT Goal Formulation: With patient/family Time For Goal Achievement: 04/12/17 Potential to Achieve Goals: Good    Frequency Min 3X/week   Barriers to discharge        Co-evaluation               AM-PAC PT "6 Clicks" Daily Activity  Outcome Measure Difficulty turning over in bed (including adjusting bedclothes, sheets and blankets)?: A Lot Difficulty moving from lying on back to sitting on the side of the bed? : Unable Difficulty sitting down on and standing up from a chair with arms (e.g., wheelchair, bedside commode, etc,.)?: Unable Help needed moving to and from a bed to chair (including a wheelchair)?: A Lot Help needed walking in hospital room?: A Little Help needed climbing 3-5 steps with a  railing? : A Lot 6 Click Score: 11    End of Session Equipment Utilized During Treatment: Gait belt;Oxygen Activity Tolerance: Patient limited by fatigue;Other (comment)(nausea) Patient left: with call bell/phone within reach;in chair;with family/visitor present   PT Visit Diagnosis: Unsteadiness on feet (R26.81);Other abnormalities of gait and mobility (R26.89);Muscle weakness (generalized) (M62.81)    Time: 2800-3491 PT Time Calculation (min) (ACUTE ONLY): 17 min   Charges:   PT Evaluation $PT Eval Moderate Complexity: 1 Mod     PT G CodesMagda Kiel, [place] [phone] 03/29/2017   Reginia Naas 03/29/2017, 11:10 AM

## 2017-03-29 NOTE — Care Management Important Message (Signed)
Important Message  Patient Details  Name: Kelly Ruiz MRN: [id number] Date of Birth: [birth year]   Medicare Important Message Given:  Yes    Sarra Rachels Montine Circle 03/29/2017, 3:05 PM

## 2017-03-29 NOTE — Care Management Note (Addendum)
Case Management Note  Patient Details  Name: Kelly Ruiz MRN: [id number] Date of Birth: 05/08/1923  Subjective/Objective:  History of chronic diastolic CHF, chronic kidney disease stage IV admitted for Acute renal failure on chronic kidney disease stage IV/non-anion gap metabolic acidosis/hyperkalemia.          Action/Plan: In to speak with patient, prior to admission patient lived at home with husband. Will be returning to the same living situation after discharge. Patient has a caregiver Mon-Fri that assists with cooking, shopping and some cleaning. PCP noted. Uses [facility] in [place], [place].  Home DME: Rollator, shower bench, built up commode.  Patient has a caregiver Mon-Fri that assists with cooking, shopping and some cleaning.  Patient son Hoy Morn takes her to medical appointments. At discharge, patient has transportation home, son Hoy Morn.  Patient has the ability to pay for medications/food.  Patient states they do not use salt and she has weight scale at home to weigh daily.  Patient has used [facility] in the past, but would like her son Hoy Morn to make the choice if required.  NCM will continue to monitor for discharge transition needs.  Expected Discharge Date:       To be determined           Expected Discharge Plan:  Home/Self Care  Discharge planning Services  CM Consult  Status of Service:  In process, will continue to follow  Additional Information: Notified [facility] of patient admission, advised no discharge needs at this time.   Kristen Cardinal, RN  Nurse case manager [place] [phone] 03/29/2017, 10:27 AM

## 2017-03-29 NOTE — Progress Notes (Signed)
[facility] PROGRESS NOTE  Kelly Ruiz RCV:[id number] DOB: 1923-06-26 DOA: 03/26/2017  PCP: Ronita Hipps, MD  Brief History/Interval Summary: 84 year old Caucasian female with a past medical history of chronic diastolic CHF, chronic kidney disease stage IV, insulin-dependent diabetes mellitus type 2, coronary artery disease, hypertension, history of diverticular bleed with chronic iron deficiency anemia presented with complains of fatigue, decreased oral intake and nausea vomiting for 2-3 days.  Denied any diarrhea.  Patient was found to have acute renal failure with hyperkalemia.  She was hospitalized for further management.  Reason for Visit: Acute renal failure with hyperkalemia.  Consultants: None yet  Procedures: None  Antibiotics: None  Subjective/Interval History: Feeling better, no nausea no vomiting no fever chills.  No abdominal pain.  ROS: No headaches  Objective:  Vital Signs  Vitals:   03/29/17 0609 03/29/17 1000 03/29/17 1727 03/29/17 1753  BP: (!) 188/53 (!) 183/48 (!) 160/43 (!) 150/41  Pulse: 72 69  76  Resp: 18 18  18   Temp: 98.4 F (36.9 C) 98.2 F (36.8 C)  98.5 F (36.9 C)  TempSrc: Oral Oral  Oral  SpO2: 100% 100%  97%  Weight:      Height:        Intake/Output Summary (Last 24 hours) at 03/29/2017 1832 Last data filed at 03/29/2017 1400 Gross per 24 hour  Intake 708.33 ml  Output 1275 ml  Net -566.67 ml   Filed Weights   03/26/17 1344  Weight: 65.3 kg (144 lb)    General appearance: alert, cooperative and mild distress Head: Normocephalic, without obvious abnormality, atraumatic Resp: Diminished air entry at the bases but no crackles appreciated.  No wheezing.  Normal effort. Cardio: regular rate and rhythm, S1, S2 normal, no murmur, click, rub or gallop GI: soft, non-tender; bowel sounds normal; no masses,  no organomegaly Extremities: extremities normal, atraumatic, no cyanosis or edema Neurologic: Awake alert.  Anxious.   No obvious focal neurological deficits.  Lab Results:  Data Reviewed: I have personally reviewed following labs and imaging studies  CBC: Recent Labs  Lab 03/26/17 1355 03/28/17 0706  WBC 5.9 7.6  NEUTROABS 4.6  --   HGB 8.8* 8.2*  HCT 27.4* 25.9*  MCV 101.5* 102.4*  PLT 125* 135*    Basic Metabolic Panel: Recent Labs  Lab 03/26/17 1900 03/26/17 2022 03/27/17 0432 03/27/17 1239 03/28/17 0706 03/29/17 0601  NA  --  133* 134* 139 143 145  K  --  5.8* 6.3* 5.1 3.8 3.7  CL  --  104 106 110 113* 118*  CO2  --  18* 18* 19* 20* 20*  GLUCOSE  --  76 103* 203* 101* 108*  BUN  --  108* 104* 101* 89* 75*  CREATININE  --  3.78* 3.62* 3.55* 2.66* 2.20*  CALCIUM  --  8.7* 8.9 9.0 8.7* 8.6*  MG 2.3  --  2.2  --   --  2.0  PHOS 5.2*  --  5.1*  --   --  4.1    GFR: Estimated Creatinine Clearance: 14 mL/min (A) (by C-G formula based on SCr of 2.2 mg/dL (H)).  Liver Function Tests: Recent Labs  Lab 03/26/17 1355 03/27/17 0432 03/29/17 0601  AST 13* 14*  --   ALT 18 16  --   ALKPHOS 82 79  --   BILITOT 0.7 0.5  --   PROT 5.5* 4.7*  --   ALBUMIN 3.2* 2.8* 2.6*    Recent Labs  Lab 03/26/17 1355  LIPASE 20    HbA1C: No results for input(s): HGBA1C in the last 72 hours.  CBG: Recent Labs  Lab 03/28/17 1622 03/28/17 2218 03/29/17 0738 03/29/17 1214 03/29/17 1656  GLUCAP 83 113* 112* 132* 120*      Radiology Studies: Dg Chest Port 1 View  Result Date: 03/29/2017 CLINICAL DATA:  Shortness of breath, productive cough. EXAM: PORTABLE CHEST 1 VIEW COMPARISON:  None. FINDINGS: Mild cardiomegaly is noted with central pulmonary vascular congestion. Atherosclerosis of thoracic aorta is noted. No pneumothorax is noted. Mild bibasilar densities are noted concerning for edema or possibly atelectasis. Minimal bilateral pleural effusions are noted. Bony thorax is unremarkable. IMPRESSION: Mild cardiomegaly with central pulmonary vascular congestion. Mild bibasilar edema or  subsegmental atelectasis is noted. Minimal bilateral pleural effusions. Aortic Atherosclerosis (ICD10-I70.0). Electronically Signed   By: Marijo Conception, M.D.   On: 03/29/2017 14:40     Medications:  Scheduled: . brinzolamide  1 drop Right Eye BID  . carvedilol  6.25 mg Oral BID WC  . docusate sodium  100 mg Oral BID  . ferrous sulfate  325 mg Oral Q breakfast  . fluticasone  1 spray Each Nare Daily  . heparin  5,000 Units Subcutaneous Q8H  . hydrALAZINE  50 mg Oral TID  . insulin aspart  0-9 Units Subcutaneous TID WC  . isosorbide mononitrate  240 mg Oral Daily  . latanoprost  1 drop Right Eye QHS  . pantoprazole  40 mg Oral Daily  . senna  1 tablet Oral BID  . sodium chloride flush  3 mL Intravenous Q12H  . timolol  1 drop Right Eye BID   Continuous: . sodium chloride 50 mL/hr at 03/29/17 0610   WJX:[id number] **OR** acetaminophen, ondansetron **OR** ondansetron (ZOFRAN) IV, sodium chloride  Assessment/Plan:  Active Problems:   AKI (acute kidney injury) ([place])    Acute renal failure on chronic kidney disease stage IV/non-anion gap metabolic acidosis/hyperkalemia Patient's creatinine was 1.63 and BUN was 53 and November 2018.  On admission it was 3.9 and BUN was 109.  She did have some nausea vomiting over the last few days.  Most likely this is prerenal.  Plus she was on diuretics and ARB at home.  Patient does not appear to have fluid overload.  She actually seems dehydrated.  She was started on IV fluids with some improvement in her creatinine but not significantly so.  She is making urine.  Potassium did improve with Kayexalate, calcium gluconate and dextrose/insulin.  Noncontrast CT scan did not show any structural issues in her GU tract.  Monitor urine output closely.  Monitor bicarbonate closely. Patient was given gentle IV hydration now that her volume appears adequately replenished will hold fluids given her history of CHF. Continue to monitor renal  function.  Nausea and vomiting Etiology unclear.  CT scan of the abdomen and pelvis did not show any obvious abnormalities.  She has not had any vomiting up on the floor yet.  Nausea and vomiting could be due to uremia.  Continue to watch for now.  History of coronary artery disease, chronic diastolic CHF, hypertension These issues appear to be stable.  Continue with home medications.  Holding her diuretics including Bumex.  Also holding her losartan due to worsening renal function. Resuming home Imdur.  Insulin-dependent diabetes mellitus with polyneuropathy She has been off of glycemic medications at home due to advanced age.  Continue with SSI.  Monitor CBGs.  Chronic microcytic anemia Hemoglobin close to baseline.  No  evidence of overt bleeding.  Anticipate some dilutional drop.  Enterococcus UTI. Present on admission. Treat with fosfomycin x1.  Neuropathy. Acute encephalopathy. In response to gabapentin patient received last night. Continue to avoid psychotropic medications in this patient.  DVT Prophylaxis: Subcutaneous heparin    Code Status: Full code Family Communication: No family at bedside.  Discussed with her son over the phone. Disposition Plan: Management as outlined above.     LOS: 3 days   [facility] Pager [phone] 03/29/2017, 6:32 PM  If 7PM-7AM, please contact night-coverage at [email or web address], password [facility]

## 2017-03-30 ENCOUNTER — Encounter (HOSPITAL_COMMUNITY): Payer: Self-pay | Admitting: Physician Assistant

## 2017-03-30 ENCOUNTER — Inpatient Hospital Stay (HOSPITAL_COMMUNITY): Payer: Medicare Other

## 2017-03-30 DIAGNOSIS — R079 Chest pain, unspecified: Secondary | ICD-10-CM

## 2017-03-30 DIAGNOSIS — I5033 Acute on chronic diastolic (congestive) heart failure: Secondary | ICD-10-CM

## 2017-03-30 DIAGNOSIS — N179 Acute kidney failure, unspecified: Principal | ICD-10-CM

## 2017-03-30 DIAGNOSIS — I10 Essential (primary) hypertension: Secondary | ICD-10-CM

## 2017-03-30 LAB — IRON AND TIBC
Iron: 29 ug/dL (ref 28–170)
Saturation Ratios: 14 % (ref 10.4–31.8)
TIBC: 211 ug/dL — ABNORMAL LOW (ref 250–450)
UIBC: 182 ug/dL

## 2017-03-30 LAB — BRAIN NATRIURETIC PEPTIDE: B NATRIURETIC PEPTIDE 5: 1844.6 pg/mL — AB (ref 0.0–100.0)

## 2017-03-30 LAB — CBC
HCT: 24.7 % — ABNORMAL LOW (ref 36.0–46.0)
HEMOGLOBIN: 7.7 g/dL — AB (ref 12.0–15.0)
MCH: 32 pg (ref 26.0–34.0)
MCHC: 31.2 g/dL (ref 30.0–36.0)
MCV: 102.5 fL — ABNORMAL HIGH (ref 78.0–100.0)
PLATELETS: 121 10*3/uL — AB (ref 150–400)
RBC: 2.41 MIL/uL — AB (ref 3.87–5.11)
RDW: 13.9 % (ref 11.5–15.5)
WBC: 5.9 10*3/uL (ref 4.0–10.5)

## 2017-03-30 LAB — RETICULOCYTES
RBC.: 2.36 MIL/uL — AB (ref 3.87–5.11)
RETIC COUNT ABSOLUTE: 110.9 10*3/uL (ref 19.0–186.0)
Retic Ct Pct: 4.7 % — ABNORMAL HIGH (ref 0.4–3.1)

## 2017-03-30 LAB — GLUCOSE, CAPILLARY
GLUCOSE-CAPILLARY: 161 mg/dL — AB (ref 65–99)
GLUCOSE-CAPILLARY: 112 mg/dL — AB (ref 65–99)
Glucose-Capillary: 95 mg/dL (ref 65–99)
Glucose-Capillary: 123 mg/dL — ABNORMAL HIGH (ref 65–99)

## 2017-03-30 LAB — FERRITIN: Ferritin: 129 ng/mL (ref 11–307)

## 2017-03-30 LAB — RENAL FUNCTION PANEL
ALBUMIN: 2.6 g/dL — AB (ref 3.5–5.0)
ANION GAP: 9 (ref 5–15)
BUN: 61 mg/dL — ABNORMAL HIGH (ref 6–20)
CALCIUM: 8.6 mg/dL — AB (ref 8.9–10.3)
CO2: 17 mmol/L — ABNORMAL LOW (ref 22–32)
CREATININE: 1.9 mg/dL — AB (ref 0.44–1.00)
Chloride: 117 mmol/L — ABNORMAL HIGH (ref 101–111)
GFR calc non Af Amer: 22 mL/min — ABNORMAL LOW (ref >60)
GFR, EST AFRICAN AMERICAN: 25 mL/min — AB (ref >60)
GLUCOSE: 102 mg/dL — AB (ref 65–99)
PHOSPHORUS: 3.5 mg/dL (ref 2.5–4.6)
Potassium: 3.8 mmol/L (ref 3.5–5.1)
SODIUM: 143 mmol/L (ref 135–145)

## 2017-03-30 LAB — CK TOTAL AND CKMB (NOT AT ARMC)
CK, MB: 3 ng/mL (ref 0.5–5.0)
Relative Index: INVALID (ref 0.0–2.5)
Total CK: 43 U/L (ref 38–234)

## 2017-03-30 LAB — TROPONIN I
TROPONIN I: 0.07 ng/mL — AB (ref <0.03)
Troponin I: 0.18 ng/mL (ref <0.03)

## 2017-03-30 LAB — PROTIME-INR
INR: 1.18
PROTHROMBIN TIME: 14.9 s (ref 11.4–15.2)

## 2017-03-30 LAB — APTT: aPTT: 35 seconds (ref 24–36)

## 2017-03-30 LAB — PREPARE RBC (CROSSMATCH)

## 2017-03-30 LAB — LACTATE DEHYDROGENASE: LDH: 129 U/L (ref 98–192)

## 2017-03-30 MED ORDER — ASPIRIN 81 MG PO CHEW
324.0000 mg | CHEWABLE_TABLET | Freq: Once | ORAL | Status: AC
  Administered 2017-03-30: 324 mg via ORAL
  Filled 2017-03-30: qty 4

## 2017-03-30 MED ORDER — NITROGLYCERIN 0.4 MG SL SUBL
0.4000 mg | SUBLINGUAL_TABLET | SUBLINGUAL | Status: DC | PRN
  Administered 2017-03-30 (×3): 0.4 mg via SUBLINGUAL
  Filled 2017-03-30 (×2): qty 1

## 2017-03-30 MED ORDER — FUROSEMIDE 10 MG/ML IJ SOLN
40.0000 mg | Freq: Once | INTRAMUSCULAR | Status: AC
  Administered 2017-03-30: 40 mg via INTRAVENOUS
  Filled 2017-03-30: qty 4

## 2017-03-30 MED ORDER — SODIUM CHLORIDE 0.9 % IV SOLN
Freq: Once | INTRAVENOUS | Status: AC
  Administered 2017-03-30: 12:00:00 via INTRAVENOUS

## 2017-03-30 MED ORDER — ASPIRIN 325 MG PO TABS
ORAL_TABLET | ORAL | Status: AC
  Filled 2017-03-30: qty 2

## 2017-03-30 MED ORDER — MORPHINE SULFATE (PF) 2 MG/ML IV SOLN
2.0000 mg | INTRAVENOUS | Status: DC | PRN
Start: 2017-03-30 — End: 2017-04-04

## 2017-03-30 NOTE — Progress Notes (Signed)
Patient's son came to nurses station and told me his mom was having severe chest pain that was radiating to her left arm.  EKG and vital signs were obtained during the time I paged Dr. Posey Pronto.   Dr. Posey Pronto came to bedside to examine patient. New orders for Asprin and nitro were given to patient.   After patient's third dose of nitro her chest pain stopped completely. During this time patient's oxygen dropped to 88% so she was placed on 2L [place].   Patient is currently on 1L [place] and oxygen saturations are 99%.   Will continue to monitor patient.   Patient verbally agreed to let me know if any new chest pain persist.

## 2017-03-30 NOTE — Progress Notes (Signed)
PT Cancellation Note  Patient Details Name: Kelly Ruiz MRN: [id number] DOB: 10/07/1923   Cancelled Treatment:      Patient attempted to see patient. Per note and patients son the patient has been having severe chest pains. Therapy will hold for today. The patients son would like the patient seen as soon as she is medically stable. He feels she may require rehab at a SNF and would like her to be assessed again following new onset of symptoms.     Carney Living 03/30/2017, 1:24 PM

## 2017-03-30 NOTE — Progress Notes (Signed)
[facility] PROGRESS NOTE  Kelly Ruiz GUY:[id number] DOB: 1923/01/22 DOA: 03/26/2017  PCP: Ronita Hipps, MD  Brief History/Interval Summary: 84 year old Caucasian female with a past medical history of chronic diastolic CHF, chronic kidney disease stage IV, insulin-dependent diabetes mellitus type 2, coronary artery disease, hypertension, history of diverticular bleed with chronic iron deficiency anemia presented with complains of fatigue, decreased oral intake and nausea vomiting for 2-3 days.  Denied any diarrhea.  Patient was found to have acute renal failure with hyperkalemia.  She was hospitalized for further management.  Reason for Visit: Acute renal failure with hyperkalemia.  Consultants: None yet  Procedures: None  Antibiotics: None  Subjective/Interval History: Complaining chest pain substernal pressure-like radiating to her left arm associated with shortness of breath.  No nausea no vomiting.  This all started this morning.  Improved with nitroglycerin.  ROS: No headaches  Objective:  Vital Signs  Vitals:   03/30/17 0925 03/30/17 1208 03/30/17 1242 03/30/17 1620  BP: (!) 170/79 (!) 157/56 (!) 139/44 (!) 146/97  Pulse:  71 72 70  Resp:  20 18 18   Temp:  98.6 F (37 C) 98.5 F (36.9 C) 98 F (36.7 C)  TempSrc:  Oral Oral Oral  SpO2:  100% 99% 100%  Weight:      Height:        Intake/Output Summary (Last 24 hours) at 03/30/2017 1623 Last data filed at 03/30/2017 1620 Gross per 24 hour  Intake 1025.42 ml  Output 1100 ml  Net -74.58 ml   Filed Weights   03/26/17 1344 03/29/17 2029  Weight: 65.3 kg (144 lb) 65.2 kg (143 lb 11.8 oz)    General appearance: alert, cooperative and mild distress Head: Normocephalic, without obvious abnormality, atraumatic Resp: Diminished air entry at the bases but no crackles appreciated.  No wheezing.  Normal effort. Cardio: regular rate and rhythm, S1, S2 normal, no murmur, click, rub or gallop GI: soft,  non-tender; bowel sounds normal; no masses,  no organomegaly Extremities: extremities normal, atraumatic, no cyanosis or edema Neurologic: Awake alert.  Anxious.  No obvious focal neurological deficits.  Lab Results:  Data Reviewed: I have personally reviewed following labs and imaging studies  CBC: Recent Labs  Lab 03/26/17 1355 03/28/17 0706 03/30/17 0724  WBC 5.9 7.6 5.9  NEUTROABS 4.6  --   --   HGB 8.8* 8.2* 7.7*  HCT 27.4* 25.9* 24.7*  MCV 101.5* 102.4* 102.5*  PLT 125* 135* 121*    Basic Metabolic Panel: Recent Labs  Lab 03/26/17 1900  03/27/17 0432 03/27/17 1239 03/28/17 0706 03/29/17 0601 03/30/17 0724  NA  --    < > 134* 139 143 145 143  K  --    < > 6.3* 5.1 3.8 3.7 3.8  CL  --    < > 106 110 113* 118* 117*  CO2  --    < > 18* 19* 20* 20* 17*  GLUCOSE  --    < > 103* 203* 101* 108* 102*  BUN  --    < > 104* 101* 89* 75* 61*  CREATININE  --    < > 3.62* 3.55* 2.66* 2.20* 1.90*  CALCIUM  --    < > 8.9 9.0 8.7* 8.6* 8.6*  MG 2.3  --  2.2  --   --  2.0  --   PHOS 5.2*  --  5.1*  --   --  4.1 3.5   < > = values in this interval not displayed.  GFR: Estimated Creatinine Clearance: 16.3 mL/min (A) (by C-G formula based on SCr of 1.9 mg/dL (H)).  Liver Function Tests: Recent Labs  Lab 03/26/17 1355 03/27/17 0432 03/29/17 0601 03/30/17 0724  AST 13* 14*  --   --   ALT 18 16  --   --   ALKPHOS 82 79  --   --   BILITOT 0.7 0.5  --   --   PROT 5.5* 4.7*  --   --   ALBUMIN 3.2* 2.8* 2.6* 2.6*    Recent Labs  Lab 03/26/17 1355  LIPASE 20    HbA1C: No results for input(s): HGBA1C in the last 72 hours.  CBG: Recent Labs  Lab 03/29/17 1214 03/29/17 1656 03/29/17 2024 03/30/17 0728 03/30/17 1148  GLUCAP 132* 120* 164* 95 123*      Radiology Studies: Dg Chest Port 1 View  Result Date: 03/30/2017 CLINICAL DATA:  Shortness of breath. EXAM: PORTABLE CHEST 1 VIEW COMPARISON:  Chest x-ray from yesterday. FINDINGS: Stable cardiomegaly,  pulmonary vascular congestion, and interstitial edema. Relatively unchanged small left greater than right pleural effusions with patchy bibasilar opacities. No pneumothorax. No acute osseous abnormality. IMPRESSION: Stable congestive heart failure. Electronically Signed   By: Titus Dubin M.D.   On: 03/30/2017 12:04   Dg Chest Port 1 View  Result Date: 03/29/2017 CLINICAL DATA:  Shortness of breath, productive cough. EXAM: PORTABLE CHEST 1 VIEW COMPARISON:  None. FINDINGS: Mild cardiomegaly is noted with central pulmonary vascular congestion. Atherosclerosis of thoracic aorta is noted. No pneumothorax is noted. Mild bibasilar densities are noted concerning for edema or possibly atelectasis. Minimal bilateral pleural effusions are noted. Bony thorax is unremarkable. IMPRESSION: Mild cardiomegaly with central pulmonary vascular congestion. Mild bibasilar edema or subsegmental atelectasis is noted. Minimal bilateral pleural effusions. Aortic Atherosclerosis (ICD10-I70.0). Electronically Signed   By: Marijo Conception, M.D.   On: 03/29/2017 14:40     Medications:  Scheduled: . aspirin      . brinzolamide  1 drop Right Eye BID  . carvedilol  6.25 mg Oral BID WC  . docusate sodium  100 mg Oral BID  . ferrous sulfate  325 mg Oral Q breakfast  . fluticasone  1 spray Each Nare Daily  . heparin  5,000 Units Subcutaneous Q8H  . hydrALAZINE  50 mg Oral TID  . insulin aspart  0-9 Units Subcutaneous TID WC  . isosorbide mononitrate  240 mg Oral Daily  . latanoprost  1 drop Right Eye QHS  . pantoprazole  40 mg Oral Daily  . senna  1 tablet Oral BID  . sodium chloride flush  3 mL Intravenous Q12H  . timolol  1 drop Right Eye BID   Continuous:  YTK:[id number] **OR** acetaminophen, morphine injection, nitroGLYCERIN, ondansetron **OR** ondansetron (ZOFRAN) IV, sodium chloride  Assessment/Plan:  Principal Problem:   AKI (acute kidney injury) ([place]) Active Problems:   Essential hypertension   Chest  pain with moderate risk of acute coronary syndrome   Insulin dependent diabetes mellitus with renal manifestation (HCC)   Acute on chronic diastolic HF (heart failure) (HCC)    Acute renal failure on chronic kidney disease stage IV/non-anion gap metabolic acidosis/hyperkalemia Patient's creatinine was 1.63 and BUN was 53 and November 2018.  On admission it was 3.9 and BUN was 109.  She did have some nausea vomiting over the last few days.  Most likely this is prerenal.  Plus she was on diuretics and ARB at home.  Patient does not appear to  have fluid overload.  She actually seems dehydrated.  She was started on IV fluids with some improvement in her creatinine but not significantly so.  She is making urine.  Potassium did improve with Kayexalate, calcium gluconate and dextrose/insulin.  Noncontrast CT scan did not show any structural issues in her GU tract.  Monitor urine output closely.  Monitor bicarbonate closely. Patient was given gentle IV hydration now that her volume appears adequately replenished will hold fluids given her history of CHF. Continue to monitor renal function.  Nausea and vomiting Etiology unclear.  CT scan of the abdomen and pelvis did not show any obvious abnormalities.  She has not had any vomiting up on the floor yet.  Nausea and vomiting could be due to uremia.  Continue to watch for now.  History of coronary artery disease, chronic diastolic CHF, hypertension These issues appear to be stable.  Continue with home medications.  Holding her diuretics including Bumex.  Also holding her losartan due to worsening renal function. Resuming home Imdur.  Insulin-dependent diabetes mellitus with polyneuropathy She has been off of glycemic medications at home due to advanced age.  Continue with SSI.  Monitor CBGs.  Chronic microcytic anemia Hemoglobin close to baseline.  No evidence of overt bleeding.  Anticipate some dilutional drop.  Enterococcus UTI. Present on  admission. Treat with fosfomycin x1.  Neuropathy. Acute encephalopathy. In response to gabapentin patient received last night. Continue to avoid psychotropic medications in this patient.  Chest pain. Substernal chest pain question unstable angina. Will provide patient 1 full dose aspirin, nitroglycerin as needed.  1 PRBC transfusion. May require IV nitroglycerin if her pain does not get better. May require heparin infusion if has recurrent chest pain. Troponin mildly elevated. EKG has some ST depression. Appreciate cartilage input. Not a good candidate for any intervention for now.  Acute on chronic diastolic CHF. Patient was on oral diuresis, this was on hold due to acute kidney injury with dehydration. Creatinine has improved and now cardiology recommending IV diuresis again. Will monitor.  Check echocardiogram    DVT Prophylaxis: Subcutaneous heparin    Code Status: Full code Family Communication: No family at bedside.  Discussed with her son over the phone. Disposition Plan: Management as outlined above.     LOS: 4 days   [facility] Pager [phone] 03/30/2017, 4:23 PM  If 7PM-7AM, please contact night-coverage at [email or web address], password [facility]

## 2017-03-30 NOTE — Consult Note (Addendum)
Cardiology Consultation:   Patient ID: Kelly Ruiz; [id number]; 07-22-1923   Admit date: 03/26/2017 Date of Consult: 03/30/2017  Primary Care Provider: Ronita Hipps, MD Primary Cardiologist: Dr Percival Spanish, 02/11/2017 Primary Electrophysiologist:  n/a   Patient Profile:   Kelly Ruiz is a 84 y.o. female with a hx of D-CHF, CKD III, DM, HTN, MV 11/2015 w/ sm defect, no ischemia,  who is being seen today for the evaluation of chest pain at the request of Dr Posey Pronto.  History of Present Illness:   Kelly Ruiz has not been feeling well for about a week.  She was having problems with nausea and vomiting.  The emesis is described as a yellowish brown.  She did not have diarrhea, she was constipated.  Her p.o. intake was decreased.  She fell twice.  She came to the emergency room on 3/12 and was admitted.  At the time of admission, her hemoglobin was 8.8, potassium 6.2, creatinine 3.9, BUN 109.  She was hydrated, intake/output not recorded but she got about 2 L of IV fluid in the emergency room and has been on IV fluids continuously since then, at least 50 mL/h.  She also got insulin, D50 and Kayexalate for the high potassium and as needed meds for the nausea.  Her carvedilol was decreased for bradycardia.  Her home Bumex was on hold and losartan was discontinued.  She has continued to be extremely weak.  With hydration, her renal function has improved back to baseline of 1.9.  CT scan of the abdomen and pelvis did not show any obvious abnormalities.  Today, Kelly Ruiz was complaining of chest pain and shortness of breath, cardiology asked to evaluate.  She describes the pain as a pressure or tightness.  It can reach a 7 or 8/10.  She has not had it recently.  She does not remember having any kind of exertional chest pain prior to her acute illness.  There is some radiation of the pain to her left arm.  She also has polyneuropathy so this is difficult to ascertain.  She has been short of breath  but does not feel the nausea and vomiting have been any different.  She has been coughing some today and the cough was productive of a brownish phlegm.  Normally, at home, she has a caregiver during the week as both she and her husband have significant debility.  She is able to get up on her own and move around with a walker.  Currently, she is extremely weak.  She has been seen by PT and home health PT as well as 24-hour supervision is recommended.   Past Medical History:  Diagnosis Date  . Arthritis   . Chest pain with moderate risk of acute coronary syndrome 11/08/2015  . CHF (congestive heart failure) (HCC)    Diastolic. Precipitated by Actos  . Chronic kidney disease    mild-moderate. GFR was 35 in 02/12  . Coronary artery disease 02/2010   abnormal nuclear stress test with mild ischemia in apical anterolateral wall.   . DM (diabetes mellitus) ([place])   . Dyslipidemia   . Dyspnea   . GERD (gastroesophageal reflux disease)   . Hypertension   . Nasal polyps   . Pneumonia   . Unspecified hypertensive heart disease without heart failure     Past Surgical History:  Procedure Laterality Date  . APPENDECTOMY  1951  . CATARACT EXTRACTION, BILATERAL    . CHOLECYSTECTOMY    . GLAUCOMA SURGERY  10/29/2013  .  hysterectomy - unknown type    . NECK MASS EXCISION     non cancerous  . TONSILLECTOMY       Prior to Admission medications   Medication Sig Start Date End Date Taking? Authorizing Provider  acetaminophen (TYLENOL) 500 MG tablet Take 1,000 mg by mouth 2 (two) times daily.    Yes [provider]  brinzolamide (AZOPT) 1 % ophthalmic suspension Place 1 drop into the right eye 2 (two) times daily.    Yes [provider]  bumetanide (BUMEX) 1 MG tablet Take 1 tablet (1 mg total) by mouth every morning and take 0.5 mg tablet (0.5 mg total) by mouth every evening. 06/13/16  Yes [provider]  carvedilol (COREG) 12.5 MG tablet Take 1 tablet (12.5 mg total) by  mouth 2 (two) times daily with a meal. Patient taking differently: Take 12.5 mg by mouth 2 (two) times daily with a meal. Take with 6.25 mg to make 18.75mg  08/03/10  Yes Wellington Hampshire, MD  carvedilol (COREG) 6.25 MG tablet Take 1 tablet (6.25 mg total) by mouth 2 (two) times daily. Take with 12.5 mg to make 18.75mg  02/19/17  Yes Minus Breeding, MD  cyanocobalamin 1000 MCG tablet Take 1,000 mcg by mouth daily.    Yes [provider]  dicyclomine (BENTYL) 10 MG capsule Take 10 mg by mouth 3 (three) times daily before meals.    Yes [provider]  diphenoxylate-atropine (LOMOTIL) 2.5-0.025 MG tablet Take 1 tablet by mouth 3 (three) times daily. 09/06/16  Yes [provider]  ferrous sulfate 325 (65 FE) MG tablet Take 325 mg by mouth 2 (two) times daily with a meal.    Yes [provider]  fluticasone (FLONASE) 50 MCG/ACT nasal spray Place 2 sprays into both nostrils 2 (two) times daily.   Yes [provider]  hydrALAZINE (APRESOLINE) 50 MG tablet Take 1 tablet (50 mg total) by mouth 3 (three) times daily. 12/07/16  Yes Danford, Suann Larry, MD  Insulin Glargine (LANTUS SOLOSTAR) 100 UNIT/ML Solostar Pen Inject 2 Units into the skin daily as needed. B.S over 200 Sliding scale   Yes [provider]  isosorbide mononitrate (IMDUR) 120 MG 24 hr tablet Take 2 tablets (240 mg total) by mouth daily. 05/03/16  Yes Minus Breeding, MD  latanoprost (XALATAN) 0.005 % ophthalmic solution Place 1 drop into the right eye at bedtime.  01/20/13  Yes [provider]  Liniments (SALONPAS ARTHRITIS PAIN RELIEF EX) Apply 1 patch topically every 12 (twelve) hours.   Yes [provider]  losartan (COZAAR) 50 MG tablet Take 50 mg by mouth at bedtime.    Yes [provider]  Multiple Vitamins-Minerals (EYE VITAMINS PO) Take 1 tablet by mouth 2 (two) times daily.    Yes [provider]  mupirocin ointment (BACTROBAN) 2 % Apply 1  application topically 2 (two) times daily. 03/04/17  Yes [provider]  nitroGLYCERIN (NITROSTAT) 0.4 MG SL tablet Place 1 tablet (0.4 mg total) under the tongue every 5 (five) minutes as needed for chest pain. 03/22/15  Yes Minus Breeding, MD  omeprazole (PRILOSEC) 20 MG capsule Take 20 mg by mouth daily.   Yes [provider]  timolol (TIMOPTIC) 0.5 % ophthalmic solution Place 1 drop into the right eye 2 (two) times daily.  01/14/12  Yes [provider]  traZODone (DESYREL) 50 MG tablet Take 75 mg by mouth at bedtime. 09/28/16  Yes [provider]  vitamin C (ASCORBIC ACID)  500 MG tablet Take 500 mg by mouth 2 (two) times daily.    Yes [provider]    Inpatient Medications: Scheduled Meds: . aspirin      . brinzolamide  1 drop Right Eye BID  . carvedilol  6.25 mg Oral BID WC  . docusate sodium  100 mg Oral BID  . ferrous sulfate  325 mg Oral Q breakfast  . fluticasone  1 spray Each Nare Daily  . heparin  5,000 Units Subcutaneous Q8H  . hydrALAZINE  50 mg Oral TID  . insulin aspart  0-9 Units Subcutaneous TID WC  . isosorbide mononitrate  240 mg Oral Daily  . latanoprost  1 drop Right Eye QHS  . pantoprazole  40 mg Oral Daily  . senna  1 tablet Oral BID  . sodium chloride flush  3 mL Intravenous Q12H  . timolol  1 drop Right Eye BID   Continuous Infusions:  PRN Meds: acetaminophen **OR** acetaminophen, morphine injection, nitroGLYCERIN, ondansetron **OR** ondansetron (ZOFRAN) IV, sodium chloride  Allergies:    Allergies  Allergen Reactions  . Actos [Pioglitazone Hydrochloride]   . Felodipine Er   . Hctz [Hydrochlorothiazide]   . Lisinopril   . Plendil [Felodipine]   . Amlodipine Besylate Other (See Comments) and Hives  . Brimonidine Tartrate Other (See Comments)    Eye redness and itchiness, blurred vision  . Clonidine Derivatives Rash  . Combigan [Brimonidine Tartrate-Timolol] Other (See Comments)    Eye redness and  itchiness, blurred vision  . Dorzolamide Other (See Comments)    Increased eye pressure, blurred vision  . Monopril [Fosinopril Sodium] Cough  . Norvasc [Amlodipine Besylate] Hives  . Penicillins Rash    Has patient had a PCN reaction causing immediate rash, facial/tongue/throat swelling, SOB or lightheadedness with hypotension: unknown Has patient had a PCN reaction causing severe rash involving mucus membranes or skin necrosis: unknown Has patient had a PCN reaction that required hospitalization: unknown Has patient had a PCN reaction occurring within the last 10 years: no If all of the above answers are "NO", then may proceed with Cephalosporin use.     Social History:   Social History   Socioeconomic History  . Marital status: Married    Spouse name: Not on file  . Number of children: 3  . Years of education: Not on file  . Highest education level: Not on file  Social Needs  . Financial resource strain: Not on file  . Food insecurity - worry: Not on file  . Food insecurity - inability: Not on file  . Transportation needs - medical: Not on file  . Transportation needs - non-medical: Not on file  Occupational History  . Occupation: retired  Tobacco Use  . Smoking status: Never Smoker  . Smokeless tobacco: Never Used  Substance and Sexual Activity  . Alcohol use: No    Alcohol/week: 0.0 oz  . Drug use: No  . Sexual activity: Not on file  Other Topics Concern  . Not on file  Social History Narrative  . Not on file    Family History:   Family History  Problem Relation Age of Onset  . Diabetes Unknown   . Hypertension Unknown   . Stroke Unknown   . Asthma Mother   . Allergic rhinitis Mother   . Congestive Heart Failure Mother   . Stroke Mother    Family Status:  Family Status  Relation Name Status  . Unknown  (Not Specified)  . Unknown  (  Not Specified)  . Unknown  (Not Specified)  . Mother  Deceased  . Father  Deceased  . MGM  Deceased  . MGF  Deceased    . PGM  Deceased  . PGF  Deceased    ROS:  Please see the history of present illness.  All other ROS reviewed and negative.     Physical Exam/Data:   Vitals:   03/30/17 0900 03/30/17 0925 03/30/17 1208 03/30/17 1242  BP: (!) 159/66 (!) 170/79 (!) 157/56 (!) 139/44  Pulse:   71 72  Resp:   20 18  Temp:   98.6 F (37 C) 98.5 F (36.9 C)  TempSrc:   Oral Oral  SpO2:   100% 99%  Weight:      Height:        Intake/Output Summary (Last 24 hours) at 03/30/2017 1316 Last data filed at 03/30/2017 0601 Gross per 24 hour  Intake 540 ml  Output 1525 ml  Net -985 ml   Filed Weights   03/26/17 1344 03/29/17 2029  Weight: 144 lb (65.3 kg) 143 lb 11.8 oz (65.2 kg)   Body mass index is 28.07 kg/m.  General:  Well nourished, well developed, in no acute distress HEENT: normal for age Lymph: no adenopathy Neck: JVD elevated at 10 cm Endocrine:  No thryomegaly Vascular: No carotid bruits; 4/4 extremity pulses 1-2+, without bruits  Cardiac:  normal S1, S2; RRR; no murmur  Lungs: Bibasilar Rales with expiratory wheeze, no rhonchi  Abd: soft, nontender, no hepatomegaly  Ext: no edema Musculoskeletal:  No deformities, BUE and BLE strength generally weak and equal Skin: warm and dry  Neuro:  CNs 2-12 intact, no focal abnormalities noted Psych:  Normal affect   EKG:  The EKG was personally reviewed and demonstrates: Sinus rhythm, heart rate 92, left axis deviation and LVH are old, QRS duration 118 ms Telemetry:  Telemetry was personally reviewed and demonstrates: Sinus rhythm/sinus tach/sinus bradycardia in the 50s with PVCs  Relevant CV Studies:  ECHO: Ordered  ECHO: 06/25/2016 - Left ventricle: The cavity size was normal. There was moderate   concentric hypertrophy. Systolic function was vigorous. The   estimated ejection fraction was in the range of 65% to 70%. Wall   motion was normal; there were no regional wall motion   abnormalities. Doppler parameters are consistent with  abnormal   left ventricular relaxation (grade 1 diastolic dysfunction).   Doppler parameters are consistent with elevated ventricular   end-diastolic filling pressure. - Aortic valve: Transvalvular velocity was within the normal range.   There was no stenosis. There was no regurgitation. - Mitral valve: Calcified annulus. Mildly thickened leaflets .   There was mild regurgitation. - Left atrium: The atrium was mildly dilated. - Right ventricle: Systolic function was normal. - Tricuspid valve: There was mild regurgitation. - Pulmonary arteries: Systolic pressure was mildly increased. PA   peak pressure: 36 mm Hg (S). - Inferior vena cava: The vessel was normal in size. - Pericardium, extracardiac: There was no pericardial effusion.  MYOVIEW: 11/16/2015  The left ventricular ejection fraction is normal (55-65%).  Nuclear stress EF: 61%. Apical inferior hypokinesis.  There was no ST segment deviation noted during stress.  Defect 1: There is a small defect of mild severity present in the apex location.  This is a low risk study. No ischemia identified.   Laboratory Data:  Chemistry Recent Labs  Lab 03/28/17 0706 03/29/17 0601 03/30/17 0724  NA 143 145 143  K 3.8 3.7  3.8  CL 113* 118* 117*  CO2 20* 20* 17*  GLUCOSE 101* 108* 102*  BUN 89* 75* 61*  CREATININE 2.66* 2.20* 1.90*  CALCIUM 8.7* 8.6* 8.6*  GFRNONAA 15* 18* 22*  GFRAA 17* 21* 25*  ANIONGAP 10 7 9     Lab Results  Component Value Date   ALT 16 03/27/2017   AST 14 (L) 03/27/2017   ALKPHOS 79 03/27/2017   BILITOT 0.5 03/27/2017   Hematology Recent Labs  Lab 03/26/17 1355 03/28/17 0706 03/30/17 0724 03/30/17 1042  WBC 5.9 7.6 5.9  --   RBC 2.70* 2.53* 2.41* 2.36*  HGB 8.8* 8.2* 7.7*  --   HCT 27.4* 25.9* 24.7*  --   MCV 101.5* 102.4* 102.5*  --   MCH 32.6 32.4 32.0  --   MCHC 32.1 31.7 31.2  --   RDW 14.1 14.0 13.9  --   PLT 125* 135* 121*  --    TSH:  Lab Results  Component Value Date   TSH  1.100 12/05/2016   HgbA1c: Lab Results  Component Value Date   HGBA1C 5.5 03/26/2017   Magnesium:  Magnesium  Date Value Ref Range Status  03/29/2017 2.0 1.7 - 2.4 mg/dL Final    Comment:    Performed at [facility], [place] [address], [place], [place] [address]   Lab Results  Component Value Date   CKTOTAL 43 03/30/2017   CKMB 3.0 03/30/2017   TROPONINI 0.07 (HH) 03/30/2017     Radiology/Studies:  Ct Abdomen Pelvis Wo Contrast  Result Date: 03/26/2017 CLINICAL DATA:  Mid abdominal pain and vomiting for 3 days. History of diabetes, hypertension, hysterectomy, cholecystectomy and appendectomy. EXAM: CT ABDOMEN AND PELVIS WITHOUT CONTRAST TECHNIQUE: Multidetector CT imaging of the abdomen and pelvis was performed following the standard protocol without IV contrast. COMPARISON:  None. FINDINGS: LOWER CHEST: Multiple sub solid bibasilar pulmonary nodules measuring to 4 mm. Imaged heart is upper limits of normal in size. Mild coronary artery calcifications, mitral annular calcifications. No pericardial effusion. HEPATOBILIARY: Status post cholecystectomy. Negative noncontrast CT liver. PANCREAS: Atrophic, nonacute. SPLEEN: Normal. Inter splenic calcified granuloma versus vascular calcification. ADRENALS/URINARY TRACT: Kidneys are orthotopic, moderate symmetric atrophy. No nephrolithiasis, hydronephrosis; limited assessment for renal masses on this nonenhanced examination. The unopacified ureters are normal in course and caliber. Urinary bladder is well distended and unremarkable. Normal adrenal glands. STOMACH/BOWEL: The stomach, small and large bowel are normal in course and caliber without inflammatory changes, sensitivity decreased by lack of enteric contrast. 8 mm ovoid calcification dependent in gastric antrum. Moderate volume retained large bowel stool. Mild colonic diverticulosis. Moderate air and fluid containing duodenal diverticulum. VASCULAR/LYMPHATIC: Aortoiliac vessels are normal  in course and caliber. Moderate calcific atherosclerosis. No lymphadenopathy by CT size criteria. REPRODUCTIVE: Status post hysterectomy. OTHER: No intraperitoneal free fluid or free air. MUSCULOSKELETAL: Non-acute. Osteopenia. Levoscoliosis and severe degenerative change of lumbar spine. Thickened T12 trabecular with small associated lucency seen with Paget's disease or hemangioma. Focal lucency LEFT humeral head equivocal for avascular necrosis without collapse. Anterior abdominal wall ligamentous laxity and scarring. IMPRESSION: 1. Moderate volume retained large bowel stool without bowel obstruction nor acute intra-abdominal/pelvic process. 2. Moderately atrophic kidneys without nephrolithiasis or obstructive uropathy. 3. **An incidental finding of potential clinical significance has been found. Multiple bibasilar sub solid pulmonary nodules measuring less than 6 mm. Recommend follow-up CT chest at 3-6 months. This recommendation follows the consensus statement: Guidelines for Management of Small Pulmonary Nodules Detected on CT Images: From the Fleischner  Society 2017; Radiology 2017; [id number].** Aortic Atherosclerosis (ICD10-I70.0). Electronically Signed   By: Elon Alas M.D.   On: 03/26/2017 16:19   Dg Chest Port 1 View  Result Date: 03/30/2017 CLINICAL DATA:  Shortness of breath. EXAM: PORTABLE CHEST 1 VIEW COMPARISON:  Chest x-ray from yesterday. FINDINGS: Stable cardiomegaly, pulmonary vascular congestion, and interstitial edema. Relatively unchanged small left greater than right pleural effusions with patchy bibasilar opacities. No pneumothorax. No acute osseous abnormality. IMPRESSION: Stable congestive heart failure. Electronically Signed   By: Titus Dubin M.D.   On: 03/30/2017 12:04   Dg Chest Port 1 View  Result Date: 03/29/2017 CLINICAL DATA:  Shortness of breath, productive cough. EXAM: PORTABLE CHEST 1 VIEW COMPARISON:  None. FINDINGS: Mild cardiomegaly is noted with central  pulmonary vascular congestion. Atherosclerosis of thoracic aorta is noted. No pneumothorax is noted. Mild bibasilar densities are noted concerning for edema or possibly atelectasis. Minimal bilateral pleural effusions are noted. Bony thorax is unremarkable. IMPRESSION: Mild cardiomegaly with central pulmonary vascular congestion. Mild bibasilar edema or subsegmental atelectasis is noted. Minimal bilateral pleural effusions. Aortic Atherosclerosis (ICD10-I70.0). Electronically Signed   By: Marijo Conception, M.D.   On: 03/29/2017 14:40    Assessment and Plan:   Principal Problem: 1.  AKI (acute kidney injury) ([place]) - per IM, improved w/ hydration  Active Problems: 2.  Essential hypertension -BP is higher than on admission, as high as 902 systolic. -Discussed meds with Dr. Percival Spanish, her losartan has been discontinued, Bumex on hold and Coreg decreased.  3.  Chest pain with moderate risk of acute coronary syndrome -ECG with no new ischemic changes. -Symptoms started after she was hydrated and she has volume overload by exam -Will cycle cardiac enzymes - further eval depends on echo results and enzyme results  4.  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  Acute on chronic diastolic HF (heart failure) (HCC) -Difficult to avoid in the setting of the need for hydration. -We will recheck a chest x-ray and a BNP. -Discussed IV Lasix with Dr. Percival Spanish.   For questions or updates, please contact [place] Please consult [email or web address] for contact info under Cardiology/STEMI.   Signed, Minus Breeding, MD  03/30/2017 1:16 PM   History and all data above reviewed.  Patient examined.  I agree with the findings as above.  The patient is well known to me.  She has had tenuous volume status with diastolic HF and CKD.  Admitted with dehydration.  Now with chest pain and mild troponin elevation.  She reports that she had chest pain with left arm radiation.   This was intense and relieved by SLNTG.  Now being transfused.  Currently pain free.  She is very fatigued.  I saw her socially in the room yesterday and she looks more fatigued now.  Increased dyspnea as well.  The patient exam reveals COR:RRR  ,  Lungs: Decreased breath sounds with crackles  ,  Abd: Positive bowel sounds, no rebound no guarding, Ext No edema  .  All available labs, radiology testing, previous records reviewed. Agree with documented assessment and plan. UNSTABLE ANGINA:  Agree with transfusion.  On high dose PO nitrates and now that she is pain free I will continue this.  If she has recurrent pain we will need to start IV NTG.  I would not start heparin at this point but if she has recurrent pain we would need this.  Continue current dose of beta blocker.  Acute on chronic heart failure.  Previously this has been diastolic.  She is now volume overloaded.   Creat has improved.  I am going to give her one dose of IV Lasix.  Code status:  Her son Education officer, community) reports she has previously been a DNR which is what I remember and I will change the code status to reflect these wishes.  I encouraged her 61 year old husband to visit tomorrow as she will likely be here a few more days or she could have a continued or sudden decline.  (They have been married forever.  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

## 2017-03-30 NOTE — Progress Notes (Signed)
CRITICAL VALUE STICKER  CRITICAL VALUE: Troponin 0.07  RECEIVER (on-site recipient of call): Julieanne Cotton, RN    [place] NOTIFIED: 03/30/2017 @ 1200  MESSENGER (representative from lab): Unknown  MD NOTIFIED: Dr. Posey Pronto Attending  TIME OF NOTIFICATION: 1207  RESPONSE: Awaiting response

## 2017-03-31 ENCOUNTER — Inpatient Hospital Stay (HOSPITAL_COMMUNITY): Payer: Medicare Other

## 2017-03-31 LAB — BPAM RBC
BLOOD PRODUCT EXPIRATION DATE: 201904052359
ISSUE DATE / TIME: 201903161221
UNIT TYPE AND RH: 7300

## 2017-03-31 LAB — TROPONIN I: TROPONIN I: 0.19 ng/mL — AB (ref <0.03)

## 2017-03-31 LAB — RENAL FUNCTION PANEL
Albumin: 2.5 g/dL — ABNORMAL LOW (ref 3.5–5.0)
Anion gap: 7 (ref 5–15)
BUN: 57 mg/dL — ABNORMAL HIGH (ref 6–20)
CHLORIDE: 116 mmol/L — AB (ref 101–111)
CO2: 19 mmol/L — AB (ref 22–32)
CREATININE: 2.03 mg/dL — AB (ref 0.44–1.00)
Calcium: 8.6 mg/dL — ABNORMAL LOW (ref 8.9–10.3)
GFR calc Af Amer: 24 mL/min — ABNORMAL LOW (ref >60)
GFR, EST NON AFRICAN AMERICAN: 20 mL/min — AB (ref >60)
Glucose, Bld: 108 mg/dL — ABNORMAL HIGH (ref 65–99)
Phosphorus: 4.3 mg/dL (ref 2.5–4.6)
Potassium: 4.1 mmol/L (ref 3.5–5.1)
Sodium: 142 mmol/L (ref 135–145)

## 2017-03-31 LAB — TYPE AND SCREEN
ABO/RH(D): B POS
Antibody Screen: NEGATIVE
UNIT DIVISION: 0

## 2017-03-31 LAB — CBC
HCT: 28.6 % — ABNORMAL LOW (ref 36.0–46.0)
Hemoglobin: 9.3 g/dL — ABNORMAL LOW (ref 12.0–15.0)
MCH: 33.3 pg (ref 26.0–34.0)
MCHC: 32.5 g/dL (ref 30.0–36.0)
MCV: 102.5 fL — AB (ref 78.0–100.0)
PLATELETS: 108 10*3/uL — AB (ref 150–400)
RBC: 2.79 MIL/uL — ABNORMAL LOW (ref 3.87–5.11)
RDW: 14.4 % (ref 11.5–15.5)
WBC: 5.9 10*3/uL (ref 4.0–10.5)

## 2017-03-31 LAB — GLUCOSE, CAPILLARY
GLUCOSE-CAPILLARY: 103 mg/dL — AB (ref 65–99)
GLUCOSE-CAPILLARY: 144 mg/dL — AB (ref 65–99)
GLUCOSE-CAPILLARY: 153 mg/dL — AB (ref 65–99)
Glucose-Capillary: 95 mg/dL (ref 65–99)

## 2017-03-31 LAB — ECHOCARDIOGRAM COMPLETE
Height: 60 in
Weight: 2303.37 oz

## 2017-03-31 MED ORDER — IPRATROPIUM-ALBUTEROL 0.5-2.5 (3) MG/3ML IN SOLN
3.0000 mL | Freq: Two times a day (BID) | RESPIRATORY_TRACT | Status: DC
Start: 2017-04-01 — End: 2017-04-04
  Administered 2017-04-01 – 2017-04-04 (×7): 3 mL via RESPIRATORY_TRACT
  Filled 2017-03-31 (×7): qty 3

## 2017-03-31 MED ORDER — GUAIFENESIN ER 600 MG PO TB12
600.0000 mg | ORAL_TABLET | Freq: Two times a day (BID) | ORAL | Status: DC
  Administered 2017-03-31 – 2017-04-04 (×9): 600 mg via ORAL
  Filled 2017-03-31 (×9): qty 1

## 2017-03-31 MED ORDER — IPRATROPIUM-ALBUTEROL 0.5-2.5 (3) MG/3ML IN SOLN
3.0000 mL | Freq: Four times a day (QID) | RESPIRATORY_TRACT | Status: DC
  Administered 2017-03-31 (×3): 3 mL via RESPIRATORY_TRACT
  Filled 2017-03-31 (×3): qty 3

## 2017-03-31 MED ORDER — FUROSEMIDE 10 MG/ML IJ SOLN
40.0000 mg | Freq: Once | INTRAMUSCULAR | Status: AC
  Administered 2017-03-31: 40 mg via INTRAVENOUS
  Filled 2017-03-31: qty 4

## 2017-03-31 NOTE — Progress Notes (Signed)
PT Cancellation Note  Patient Details Name: Kelly Ruiz MRN: [id number] DOB: 02-19-1923   Cancelled Treatment:    Reason Eval/Treat Not Completed: Patient not medically ready  Patient being examined by Dr. Percival Spanish and he reports she is not currently appropriate to work with PT. (Wheezing, headache, generally not well). Will attempt to see later today as schedule permits (noted question re: whether patient will need higher level of care with PT originally recommending home with HHPT).    Jeanie Cooks Nyelli Samara, PT 03/31/2017, 10:25 AM

## 2017-03-31 NOTE — Progress Notes (Signed)
[facility] PROGRESS NOTE  Pollyann Roa ENI:[id number] DOB: [birth year] DOA: 03/26/2017  PCP: Ronita Hipps, MD  Brief History/Interval Summary: 84 year old Caucasian female with a past medical history of chronic diastolic CHF, chronic kidney disease stage IV, insulin-dependent diabetes mellitus type 2, coronary artery disease, hypertension, history of diverticular bleed with chronic iron deficiency anemia presented with complains of fatigue, decreased oral intake and nausea vomiting for 2-3 days.  Denied any diarrhea.  Patient was found to have acute renal failure with hyperkalemia.  She was hospitalized for further management.  Reason for Visit: Acute renal failure with hyperkalemia.  Consultants: None yet  Procedures: None  Antibiotics: None  Subjective/Interval History: Chest pain resolved, has some cough as well as wheezing.  No nausea no vomiting.  Complains about headache in the frontal region as well. No focal deficit. Tylenol helps with the headache.  ROS: No headaches  Objective:  Vital Signs  Vitals:   03/31/17 0541 03/31/17 0744 03/31/17 1349 03/31/17 1640  BP: 127/61 (!) 160/109  116/72  Pulse: 68 73  69  Resp: 19 20  18   Temp: 98.2 F (36.8 C) 98.7 F (37.1 C)  98.1 F (36.7 C)  TempSrc: Oral Oral  Oral  SpO2: 100% 100% 93% 94%  Weight:      Height:        Intake/Output Summary (Last 24 hours) at 03/31/2017 1746 Last data filed at 03/31/2017 1500 Gross per 24 hour  Intake 240 ml  Output 1550 ml  Net -1310 ml   Filed Weights   03/29/17 2029 03/30/17 2050 03/31/17 0356  Weight: 65.2 kg (143 lb 11.8 oz) 65.3 kg (143 lb 15.4 oz) 65.3 kg (143 lb 15.4 oz)    General appearance: alert, cooperative and mild distress Head: Normocephalic, without obvious abnormality, atraumatic Resp: Diminished air entry at the bases but no crackles appreciated.  Bilateral expiratory wheezing.  Normal effort. Cardio: regular rate and rhythm, S1, S2 normal, no  murmur, click, rub or gallop GI: soft, non-tender; bowel sounds normal; no masses,  no organomegaly Extremities: extremities normal, atraumatic, no cyanosis or edema Neurologic: Awake alert.  Anxious.  No obvious focal neurological deficits.  Lab Results:  Data Reviewed: I have personally reviewed following labs and imaging studies  CBC: Recent Labs  Lab 03/26/17 1355 03/28/17 0706 03/30/17 0724 03/31/17 0048  WBC 5.9 7.6 5.9 5.9  NEUTROABS 4.6  --   --   --   HGB 8.8* 8.2* 7.7* 9.3*  HCT 27.4* 25.9* 24.7* 28.6*  MCV 101.5* 102.4* 102.5* 102.5*  PLT 125* 135* 121* 108*    Basic Metabolic Panel: Recent Labs  Lab 03/26/17 1900  03/27/17 0432 03/27/17 1239 03/28/17 0706 03/29/17 0601 03/30/17 0724 03/31/17 0048  NA  --    < > 134* 139 143 145 143 142  K  --    < > 6.3* 5.1 3.8 3.7 3.8 4.1  CL  --    < > 106 110 113* 118* 117* 116*  CO2  --    < > 18* 19* 20* 20* 17* 19*  GLUCOSE  --    < > 103* 203* 101* 108* 102* 108*  BUN  --    < > 104* 101* 89* 75* 61* 57*  CREATININE  --    < > 3.62* 3.55* 2.66* 2.20* 1.90* 2.03*  CALCIUM  --    < > 8.9 9.0 8.7* 8.6* 8.6* 8.6*  MG 2.3  --  2.2  --   --  2.0  --   --   PHOS 5.2*  --  5.1*  --   --  4.1 3.5 4.3   < > = values in this interval not displayed.    GFR: Estimated Creatinine Clearance: 15.2 mL/min (A) (by C-G formula based on SCr of 2.03 mg/dL (H)).  Liver Function Tests: Recent Labs  Lab 03/26/17 1355 03/27/17 0432 03/29/17 0601 03/30/17 0724 03/31/17 0048  AST 13* 14*  --   --   --   ALT 18 16  --   --   --   ALKPHOS 82 79  --   --   --   BILITOT 0.7 0.5  --   --   --   PROT 5.5* 4.7*  --   --   --   ALBUMIN 3.2* 2.8* 2.6* 2.6* 2.5*    Recent Labs  Lab 03/26/17 1355  LIPASE 20    HbA1C: No results for input(s): HGBA1C in the last 72 hours.  CBG: Recent Labs  Lab 03/30/17 1644 03/30/17 2048 03/31/17 0741 03/31/17 1159 03/31/17 1641  GLUCAP 161* 112* 95 103* 144*      Radiology  Studies: Dg Chest Port 1 View  Result Date: 03/30/2017 CLINICAL DATA:  Shortness of breath. EXAM: PORTABLE CHEST 1 VIEW COMPARISON:  Chest x-ray from yesterday. FINDINGS: Stable cardiomegaly, pulmonary vascular congestion, and interstitial edema. Relatively unchanged small left greater than right pleural effusions with patchy bibasilar opacities. No pneumothorax. No acute osseous abnormality. IMPRESSION: Stable congestive heart failure. Electronically Signed   By: Titus Dubin M.D.   On: 03/30/2017 12:04     Medications:  Scheduled: . brinzolamide  1 drop Right Eye BID  . carvedilol  6.25 mg Oral BID WC  . docusate sodium  100 mg Oral BID  . ferrous sulfate  325 mg Oral Q breakfast  . fluticasone  1 spray Each Nare Daily  . guaiFENesin  600 mg Oral BID  . heparin  5,000 Units Subcutaneous Q8H  . hydrALAZINE  50 mg Oral TID  . insulin aspart  0-9 Units Subcutaneous TID WC  . ipratropium-albuterol  3 mL Nebulization Q6H  . isosorbide mononitrate  240 mg Oral Daily  . latanoprost  1 drop Right Eye QHS  . pantoprazole  40 mg Oral Daily  . senna  1 tablet Oral BID  . sodium chloride flush  3 mL Intravenous Q12H  . timolol  1 drop Right Eye BID   Continuous:  YOV:[id number] **OR** acetaminophen, morphine injection, nitroGLYCERIN, ondansetron **OR** ondansetron (ZOFRAN) IV, sodium chloride  Assessment/Plan:  Principal Problem:   AKI (acute kidney injury) ([place]) Active Problems:   Essential hypertension   Chest pain with moderate risk of acute coronary syndrome   Insulin dependent diabetes mellitus with renal manifestation (HCC)   Acute on chronic diastolic HF (heart failure) (HCC)    Acute renal failure on chronic kidney disease stage IV/non-anion gap metabolic acidosis/hyperkalemia Patient's creatinine was 1.63 and BUN was 53 and November 2018.  On admission it was 3.9 and BUN was 109.  She did have some nausea vomiting over the last few days.  Most likely this is prerenal.   Plus she was on diuretics and ARB at home.  Patient does not appear to have fluid overload.  She actually seems dehydrated.  She was started on IV fluids with some improvement in her creatinine but not significantly so.  She is making urine.  Potassium did improve with Kayexalate, calcium gluconate and dextrose/insulin.  Noncontrast  CT scan did not show any structural issues in her GU tract.  Monitor urine output closely.  Monitor bicarbonate closely. Patient was given gentle IV hydration now that her volume appears adequately replenished will hold fluids given her history of CHF. Continue to monitor renal function.   Nausea and vomiting Etiology unclear.  CT scan of the abdomen and pelvis did not show any obvious abnormalities.  She has not had any vomiting up on the floor yet.  Nausea and vomiting could be due to uremia.  Continue to watch for now.  History of coronary artery disease, chronic diastolic CHF, hypertension These issues appear to be stable.  Continue with home medications.  Holding her diuretics including Bumex.  Also holding her losartan due to worsening renal function. Resuming home Imdur.  Insulin-dependent diabetes mellitus with polyneuropathy She has been off of glycemic medications at home due to advanced age.  Continue with SSI.  Monitor CBGs.  Chronic microcytic anemia Hemoglobin close to baseline.  No evidence of overt bleeding.  Anticipate some dilutional drop.  Enterococcus UTI. Present on admission. Treat with fosfomycin x1.  Neuropathy. Acute encephalopathy. In response to gabapentin patient received last night. Continue to avoid psychotropic medications in this patient.  Chest pain.  Unstable angina Substernal chest pain question unstable angina. S/P 1 PRBC transfusion.  PRN nitroglycerin. May require IV nitroglycerin if her pain does not get better. May require heparin infusion if has recurrent chest pain. Troponin mildly elevated. EKG has some ST  depression. Appreciate cartilage input. Not a good candidate for any intervention for now. Echocardiogram shows preserved EF without any wall motion abnormality.  Acute on chronic diastolic CHF. Patient was on oral diuresis, this was on hold due to acute kidney injury with dehydration. Creatinine has improved and now cardiology recommending IV diuresis again.  Bilateral wheezing. Likely in the setting of CHF. Possibility of acute bronchitis cannot be ruled out but more likely viral. Monitors supportive treatment with DuoNeb's.  Headache. As needed Tylenol.  DVT Prophylaxis: Subcutaneous heparin    Code Status: Full code Family Communication: No family at bedside.  Discussed with her son over the phone. Disposition Plan: Management as outlined above.     LOS: 5 days   [facility] 03/31/2017, 5:46 PM  If 7PM-7AM, please contact night-coverage at [email or web address], password [email or web address]

## 2017-03-31 NOTE — Progress Notes (Signed)
*  PRELIMINARY RESULTS* Echocardiogram 2D Echocardiogram has been performed.  Leavy Cella 03/31/2017, 11:10 AM

## 2017-03-31 NOTE — Progress Notes (Addendum)
Progress Note  Patient Name: Kelly Ruiz Date of Encounter: 03/31/2017  Primary Cardiologist:   Minus Breeding, MD   Subjective   She is very weak and still SOB.  Also complains of a headache.  Inpatient Medications    Scheduled Meds: . brinzolamide  1 drop Right Eye BID  . carvedilol  6.25 mg Oral BID WC  . docusate sodium  100 mg Oral BID  . ferrous sulfate  325 mg Oral Q breakfast  . fluticasone  1 spray Each Nare Daily  . guaiFENesin  600 mg Oral BID  . heparin  5,000 Units Subcutaneous Q8H  . hydrALAZINE  50 mg Oral TID  . insulin aspart  0-9 Units Subcutaneous TID WC  . ipratropium-albuterol  3 mL Nebulization Q6H  . isosorbide mononitrate  240 mg Oral Daily  . latanoprost  1 drop Right Eye QHS  . pantoprazole  40 mg Oral Daily  . senna  1 tablet Oral BID  . sodium chloride flush  3 mL Intravenous Q12H  . timolol  1 drop Right Eye BID   Continuous Infusions:  PRN Meds: acetaminophen **OR** acetaminophen, morphine injection, nitroGLYCERIN, ondansetron **OR** ondansetron (ZOFRAN) IV, sodium chloride   Vital Signs    Vitals:   03/30/17 2050 03/31/17 0356 03/31/17 0541 03/31/17 0744  BP: 134/68  127/61 (!) 160/109  Pulse: 75  68 73  Resp: 20  19 20   Temp: 98.9 F (37.2 C)  98.2 F (36.8 C) 98.7 F (37.1 C)  TempSrc: Oral  Oral Oral  SpO2: 100%  100% 100%  Weight: 143 lb 15.4 oz (65.3 kg) 143 lb 15.4 oz (65.3 kg)    Height:        Intake/Output Summary (Last 24 hours) at 03/31/2017 1016 Last data filed at 03/31/2017 9892 Gross per 24 hour  Intake 925.42 ml  Output 750 ml  Net 175.42 ml   Filed Weights   03/29/17 2029 03/30/17 2050 03/31/17 0356  Weight: 143 lb 11.8 oz (65.2 kg) 143 lb 15.4 oz (65.3 kg) 143 lb 15.4 oz (65.3 kg)    Telemetry    NSR - Personally Reviewed  ECG    NA - Personally Reviewed  Physical Exam   GEN: No acute distress.  Very frail Neck: No  JVD Cardiac: RRR, no murmurs, rubs, or gallops.  Respiratory:    Decreased breath sounds with expiratory wheezing GI: Soft, nontender, non-distended  MS: No  edema; No deformity. Neuro:  Nonfocal  Psych: Normal affect   Labs    Chemistry Recent Labs  Lab 03/26/17 1355  03/27/17 0432  03/29/17 0601 03/30/17 0724 03/31/17 0048  NA 132*   < > 134*   < > 145 143 142  K 6.2*   < > 6.3*   < > 3.7 3.8 4.1  CL 103   < > 106   < > 118* 117* 116*  CO2 18*   < > 18*   < > 20* 17* 19*  GLUCOSE 111*   < > 103*   < > 108* 102* 108*  BUN 109*   < > 104*   < > 75* 61* 57*  CREATININE 3.90*   < > 3.62*   < > 2.20* 1.90* 2.03*  CALCIUM 9.0   < > 8.9   < > 8.6* 8.6* 8.6*  PROT 5.5*  --  4.7*  --   --   --   --   ALBUMIN 3.2*  --  2.8*  --  2.6* 2.6* 2.5*  AST 13*  --  14*  --   --   --   --   ALT 18  --  16  --   --   --   --   ALKPHOS 82  --  79  --   --   --   --   BILITOT 0.7  --  0.5  --   --   --   --   GFRNONAA 9*   < > 10*   < > 18* 22* 20*  GFRAA 11*   < > 12*   < > 21* 25* 24*  ANIONGAP 11   < > 10   < > 7 9 7    < > = values in this interval not displayed.     Hematology Recent Labs  Lab 03/28/17 0706 03/30/17 0724 03/30/17 1042 03/31/17 0048  WBC 7.6 5.9  --  5.9  RBC 2.53* 2.41* 2.36* 2.79*  HGB 8.2* 7.7*  --  9.3*  HCT 25.9* 24.7*  --  28.6*  MCV 102.4* 102.5*  --  102.5*  MCH 32.4 32.0  --  33.3  MCHC 31.7 31.2  --  32.5  RDW 14.0 13.9  --  14.4  PLT 135* 121*  --  108*    Cardiac Enzymes Recent Labs  Lab 03/30/17 1042 03/30/17 1924 03/31/17 0048  TROPONINI 0.07* 0.18* 0.19*   No results for input(s): TROPIPOC in the last 168 hours.   BNP Recent Labs  Lab 03/30/17 1042  BNP 1,844.6*     DDimer No results for input(s): DDIMER in the last 168 hours.   Radiology    Dg Chest Port 1 View  Result Date: 03/30/2017 CLINICAL DATA:  Shortness of breath. EXAM: PORTABLE CHEST 1 VIEW COMPARISON:  Chest x-ray from yesterday. FINDINGS: Stable cardiomegaly, pulmonary vascular congestion, and interstitial edema. Relatively  unchanged small left greater than right pleural effusions with patchy bibasilar opacities. No pneumothorax. No acute osseous abnormality. IMPRESSION: Stable congestive heart failure. Electronically Signed   By: Titus Dubin M.D.   On: 03/30/2017 12:04   Dg Chest Port 1 View  Result Date: 03/29/2017 CLINICAL DATA:  Shortness of breath, productive cough. EXAM: PORTABLE CHEST 1 VIEW COMPARISON:  None. FINDINGS: Mild cardiomegaly is noted with central pulmonary vascular congestion. Atherosclerosis of thoracic aorta is noted. No pneumothorax is noted. Mild bibasilar densities are noted concerning for edema or possibly atelectasis. Minimal bilateral pleural effusions are noted. Bony thorax is unremarkable. IMPRESSION: Mild cardiomegaly with central pulmonary vascular congestion. Mild bibasilar edema or subsegmental atelectasis is noted. Minimal bilateral pleural effusions. Aortic Atherosclerosis (ICD10-I70.0). Electronically Signed   By: Marijo Conception, M.D.   On: 03/29/2017 14:40    Cardiac Studies   NA  Patient Profile     84 y.o. female with a hx of D-CHF, CKD III, DM, HTN, MV 11/2015 w/ sm defect, no ischemia,  who is being seen today for the evaluation of chest pain at the request of Dr Posey Pronto.   Assessment & Plan    AKI:    Creat remains relatively stable.  I would like to give her one more dose of IV Lasix today.  Continue bronchodilators as this seemed to help her breathing the most.   HTN:   I do not want to increase the beta blocker further and she is on high dose hydral/nitrates and cannot be on ARB/ACE inhibitor.  She is allergic to Norvasc.  Continue current therapy.  NSTEMI:    I suspect that she did have unstable angina yesterday.  Enzymes only bumped minimally.  Medical management  ACUTE ON CHRONIC DIASTOLIC DYSFUNCTION:   Net negative yesterday.  Gentle IV diuresis again for one more day.     For questions or updates, please contact [place] Please consult  [email or web address] for contact info under Cardiology/STEMI.   Signed, Minus Breeding, MD  03/31/2017, 10:16 AM

## 2017-04-01 LAB — RENAL FUNCTION PANEL
Albumin: 2.6 g/dL — ABNORMAL LOW (ref 3.5–5.0)
Anion gap: 10 (ref 5–15)
BUN: 53 mg/dL — AB (ref 6–20)
CALCIUM: 8.6 mg/dL — AB (ref 8.9–10.3)
CO2: 20 mmol/L — ABNORMAL LOW (ref 22–32)
CREATININE: 1.74 mg/dL — AB (ref 0.44–1.00)
Chloride: 112 mmol/L — ABNORMAL HIGH (ref 101–111)
GFR calc Af Amer: 28 mL/min — ABNORMAL LOW (ref >60)
GFR, EST NON AFRICAN AMERICAN: 24 mL/min — AB (ref >60)
Glucose, Bld: 106 mg/dL — ABNORMAL HIGH (ref 65–99)
Phosphorus: 3.9 mg/dL (ref 2.5–4.6)
Potassium: 4.2 mmol/L (ref 3.5–5.1)
Sodium: 142 mmol/L (ref 135–145)

## 2017-04-01 LAB — CBC
HEMATOCRIT: 28.8 % — AB (ref 36.0–46.0)
Hemoglobin: 9.4 g/dL — ABNORMAL LOW (ref 12.0–15.0)
MCH: 33.5 pg (ref 26.0–34.0)
MCHC: 32.6 g/dL (ref 30.0–36.0)
MCV: 102.5 fL — ABNORMAL HIGH (ref 78.0–100.0)
Platelets: 107 10*3/uL — ABNORMAL LOW (ref 150–400)
RBC: 2.81 MIL/uL — ABNORMAL LOW (ref 3.87–5.11)
RDW: 14.2 % (ref 11.5–15.5)
WBC: 4.6 10*3/uL (ref 4.0–10.5)

## 2017-04-01 LAB — GLUCOSE, CAPILLARY
GLUCOSE-CAPILLARY: 101 mg/dL — AB (ref 65–99)
GLUCOSE-CAPILLARY: 287 mg/dL — AB (ref 65–99)
Glucose-Capillary: 120 mg/dL — ABNORMAL HIGH (ref 65–99)
Glucose-Capillary: 205 mg/dL — ABNORMAL HIGH (ref 65–99)

## 2017-04-01 MED ORDER — CLONIDINE HCL 0.1 MG PO TABS: 0.1000 mg | ORAL_TABLET | Freq: Two times a day (BID) | ORAL | Status: DC

## 2017-04-01 MED ORDER — HYDRALAZINE HCL 10 MG PO TABS: 10.0000 mg | ORAL_TABLET | Freq: Three times a day (TID) | ORAL | Status: DC

## 2017-04-01 MED ORDER — FUROSEMIDE 10 MG/ML IJ SOLN
40.0000 mg | Freq: Two times a day (BID) | INTRAMUSCULAR | Status: DC
  Administered 2017-04-01 – 2017-04-03 (×5): 40 mg via INTRAVENOUS
  Filled 2017-04-01 (×5): qty 4

## 2017-04-01 MED ORDER — LABETALOL HCL 5 MG/ML IV SOLN
10.0000 mg | Freq: Once | INTRAVENOUS | Status: AC
  Administered 2017-04-02: 10 mg via INTRAVENOUS
  Filled 2017-04-01: qty 4

## 2017-04-01 MED ORDER — INSULIN ASPART 100 UNIT/ML ~~LOC~~ SOLN
5.0000 [IU] | Freq: Once | SUBCUTANEOUS | Status: AC
  Administered 2017-04-01: 5 [IU] via SUBCUTANEOUS

## 2017-04-01 MED ORDER — PREDNISONE 20 MG PO TABS
40.0000 mg | ORAL_TABLET | Freq: Every day | ORAL | Status: DC
  Administered 2017-04-01 – 2017-04-03 (×3): 40 mg via ORAL
  Filled 2017-04-01 (×3): qty 2

## 2017-04-01 MED ORDER — HYDRALAZINE HCL 20 MG/ML IJ SOLN
10.0000 mg | Freq: Four times a day (QID) | INTRAMUSCULAR | Status: DC | PRN
  Administered 2017-04-02 – 2017-04-03 (×2): 10 mg via INTRAVENOUS
  Filled 2017-04-01 (×2): qty 1

## 2017-04-01 MED ORDER — ENSURE ENLIVE PO LIQD
237.0000 mL | Freq: Two times a day (BID) | ORAL | Status: DC
  Administered 2017-04-01 – 2017-04-04 (×4): 237 mL via ORAL

## 2017-04-01 MED ORDER — BUTALBITAL-APAP-CAFFEINE 50-325-40 MG PO TABS
1.0000 | ORAL_TABLET | Freq: Four times a day (QID) | ORAL | Status: DC | PRN
  Administered 2017-04-01 – 2017-04-04 (×3): 1 via ORAL
  Filled 2017-04-01 (×3): qty 1

## 2017-04-01 MED ORDER — HYDRALAZINE HCL 10 MG PO TABS
10.0000 mg | ORAL_TABLET | Freq: Three times a day (TID) | ORAL | Status: DC | PRN
  Administered 2017-04-01: 10 mg via ORAL
  Filled 2017-04-01: qty 1

## 2017-04-01 NOTE — Progress Notes (Signed)
After giving PRN hydralazine, patients B/P is 202/70 and HR is 66. MD notified. No further orders. Night Shift RN updated of this information. Will continue to monitor.

## 2017-04-01 NOTE — Progress Notes (Signed)
Progress Note  Patient Name: Kelly Ruiz Date of Encounter: 04/01/2017  Primary Cardiologist:   Minus Breeding, MD   Subjective   Very weak and still SOB.  Her breathing is not improved and she is complaining of headaches.   Inpatient Medications    Scheduled Meds: . brinzolamide  1 drop Right Eye BID  . carvedilol  6.25 mg Oral BID WC  . docusate sodium  100 mg Oral BID  . ferrous sulfate  325 mg Oral Q breakfast  . fluticasone  1 spray Each Nare Daily  . guaiFENesin  600 mg Oral BID  . heparin  5,000 Units Subcutaneous Q8H  . hydrALAZINE  50 mg Oral TID  . insulin aspart  0-9 Units Subcutaneous TID WC  . ipratropium-albuterol  3 mL Nebulization BID  . isosorbide mononitrate  240 mg Oral Daily  . latanoprost  1 drop Right Eye QHS  . pantoprazole  40 mg Oral Daily  . senna  1 tablet Oral BID  . sodium chloride flush  3 mL Intravenous Q12H  . timolol  1 drop Right Eye BID   Continuous Infusions:  PRN Meds: acetaminophen **OR** acetaminophen, morphine injection, nitroGLYCERIN, ondansetron **OR** ondansetron (ZOFRAN) IV, sodium chloride   Vital Signs    Vitals:   04/01/17 0208 04/01/17 0510 04/01/17 0807 04/01/17 0847  BP:  138/73 (!) 207/72   Pulse:  65 64 77  Resp:  19  16  Temp:  98.3 F (36.8 C)    TempSrc:  Oral    SpO2:  97%  92%  Weight: 143 lb 11.8 oz (65.2 kg)     Height:        Intake/Output Summary (Last 24 hours) at 04/01/2017 0932 Last data filed at 04/01/2017 0800 Gross per 24 hour  Intake 360 ml  Output 1550 ml  Net -1190 ml   Filed Weights   03/31/17 0356 03/31/17 2020 04/01/17 0208  Weight: 143 lb 15.4 oz (65.3 kg) 143 lb 11.8 oz (65.2 kg) 143 lb 11.8 oz (65.2 kg)    Telemetry    NSR - Personally Reviewed  ECG    NA - Personally Reviewed  Physical Exam   GEN:   Chronically ill appearing and very weak.  Neck:   8 cm JVD Cardiac: RRR, no murmurs, rubs, or gallops.  Respiratory:  Decreased breath sounds with diffuse  expiratory wheezing GI: Soft, nontender, non-distended, normal bowel sounds  MS:  no edema; No deformity. Neuro:   Nonfocal  Psych: Oriented and appropriate    Labs    Chemistry Recent Labs  Lab 03/26/17 1355  03/27/17 0432  03/30/17 0724 03/31/17 0048 04/01/17 0538  NA 132*   < > 134*   < > 143 142 142  K 6.2*   < > 6.3*   < > 3.8 4.1 4.2  CL 103   < > 106   < > 117* 116* 112*  CO2 18*   < > 18*   < > 17* 19* 20*  GLUCOSE 111*   < > 103*   < > 102* 108* 106*  BUN 109*   < > 104*   < > 61* 57* 53*  CREATININE 3.90*   < > 3.62*   < > 1.90* 2.03* 1.74*  CALCIUM 9.0   < > 8.9   < > 8.6* 8.6* 8.6*  PROT 5.5*  --  4.7*  --   --   --   --   ALBUMIN 3.2*  --  2.8*   < > 2.6* 2.5* 2.6*  AST 13*  --  14*  --   --   --   --   ALT 18  --  16  --   --   --   --   ALKPHOS 82  --  79  --   --   --   --   BILITOT 0.7  --  0.5  --   --   --   --   GFRNONAA 9*   < > 10*   < > 22* 20* 24*  GFRAA 11*   < > 12*   < > 25* 24* 28*  ANIONGAP 11   < > 10   < > 9 7 10    < > = values in this interval not displayed.     Hematology Recent Labs  Lab 03/30/17 0724 03/30/17 1042 03/31/17 0048 04/01/17 0538  WBC 5.9  --  5.9 4.6  RBC 2.41* 2.36* 2.79* 2.81*  HGB 7.7*  --  9.3* 9.4*  HCT 24.7*  --  28.6* 28.8*  MCV 102.5*  --  102.5* 102.5*  MCH 32.0  --  33.3 33.5  MCHC 31.2  --  32.5 32.6  RDW 13.9  --  14.4 14.2  PLT 121*  --  108* 107*    Cardiac Enzymes Recent Labs  Lab 03/30/17 1042 03/30/17 1924 03/31/17 0048  TROPONINI 0.07* 0.18* 0.19*   No results for input(s): TROPIPOC in the last 168 hours.   BNP Recent Labs  Lab 03/30/17 1042  BNP 1,844.6*     DDimer No results for input(s): DDIMER in the last 168 hours.   Radiology    Dg Chest Port 1 View  Result Date: 03/30/2017 CLINICAL DATA:  Shortness of breath. EXAM: PORTABLE CHEST 1 VIEW COMPARISON:  Chest x-ray from yesterday. FINDINGS: Stable cardiomegaly, pulmonary vascular congestion, and interstitial edema.  Relatively unchanged small left greater than right pleural effusions with patchy bibasilar opacities. No pneumothorax. No acute osseous abnormality. IMPRESSION: Stable congestive heart failure. Electronically Signed   By: Titus Dubin M.D.   On: 03/30/2017 12:04    Cardiac Studies   ECHO 03/31/17  Study Conclusions  - Left ventricle: The cavity size was normal. Wall thickness was   increased in a pattern of moderate LVH. Systolic function was   normal. The estimated ejection fraction was in the range of 60%   to 65%. Wall motion was normal; there were no regional wall   motion abnormalities. Features are consistent with a pseudonormal   left ventricular filling pattern, with concomitant abnormal   relaxation and increased filling pressure (grade 2 diastolic   dysfunction). - Mitral valve: Severely calcified annulus. There was moderate   regurgitation. - Left atrium: The atrium was moderately dilated. - Pulmonary arteries: Systolic pressure was moderately increased.   PA peak pressure: 50 mm Hg (S).   Patient Profile     84 y.o. female with a hx of D-CHF, CKD III, DM, HTN, MV 11/2015 w/ sm defect, no ischemia,  who is being seen today for the evaluation of chest pain at the request of Dr Posey Pronto.   Assessment & Plan    AKI:    Creat is improved.  She did tolerate Lasix IV yesterday and I will do this BID today.  Still with suggestion of volume overload.   DYSPNEA:  Not sure if all of this is related to edema.  She does feel better after breathing treatments.  I will defer to primary care as to whether they think that she would benefit from steroids.    HTN:   I do not want to increase the beta blocker further and she is on high dose hydral/nitrates and cannot be on ARB/ACE inhibitor.  She is allergic to Norvasc.  She is allergic to clonidine.  She will need PRN hydralazine if her BP goes up.  I wrote for this.   NSTEMI:    I suspect that she did have unstable angina on Saturday.   Medical management.  EF OK as above.   ACUTE ON CHRONIC DIASTOLIC DYSFUNCTION:   Net negative yesterday 1400 cc yesterday.  I will give IV Lasix BID today.    ANEMIA:  Hgb is stable after transfusion.    For questions or updates, please contact [place] Please consult [email or web address] for contact info under Cardiology/STEMI.   Signed, Minus Breeding, MD  04/01/2017, 9:32 AM

## 2017-04-01 NOTE — [place] FL2 (Addendum)
[place] MEDICAID FL2 LEVEL OF CARE SCREENING TOOL     IDENTIFICATION  Patient Name: Kelly Ruiz Birthdate: 08-Jun-1923 Sex: female Admission Date (Current Location): 03/26/2017  [facility] and [place] Number:  [employer] and Address:  [facility], [place] [address] [place], [place] [address]      Provider Number: [id number]  Attending Physician Name and Address:  Lavina Hamman, MD  Relative Name and Phone Number:  Bianka Liberati - son; [phone]    Current Level of Care: Hospital Recommended Level of Care: [place] Prior Approval Number:    Date Approved/Denied:   PASRR Number:   [id number] A - eff. 05/05/16  Discharge Plan: SNF    Current Diagnoses: Patient Active Problem List   Diagnosis Date Noted  . AKI (acute kidney injury) ([place]) 03/26/2017  . CKD (chronic kidney disease), stage IV ([place]) 02/11/2017  . Acute on chronic diastolic HF (heart failure) ([place]) 12/05/2016  . Symptomatic anemia 12/04/2016  . Hyperkalemia 12/04/2016  . Degenerative disc disease, lumbar 11/05/2016  . Dyspnea, unspecified 04/10/2016  . Chronic renal insufficiency, stage IV (severe) ([place]) 11/08/2015  . Chest pain with moderate risk of acute coronary syndrome 11/08/2015  . Insulin dependent diabetes mellitus with renal manifestation ([place]) 11/08/2015  . Chronic diastolic congestive heart failure ([place])   . Essential hypertension     Orientation RESPIRATION BLADDER Height & Weight     Self, Time, Situation, Place  Normal Continent, External catheter(Catheter placed for urinary retention) Weight: 143 lb 11.8 oz (65.2 kg) Height:  5' (152.4 cm)  BEHAVIORAL SYMPTOMS/MOOD NEUROLOGICAL BOWEL NUTRITION STATUS      Continent Diet(Heart healthy)  AMBULATORY STATUS COMMUNICATION OF NEEDS Skin   Extensive Assist(Min assist;+2 safety/equipment during 3/15 session.) Verbally Other (Comment)(Ecchymosis right/left arm)                        Personal Care Assistance Level of Assistance  Bathing, Feeding, Dressing Bathing Assistance: Limited assistance Feeding assistance: Independent Dressing Assistance: Limited assistance     Functional Limitations Info  Sight, Hearing, Speech Sight Info: Adequate Hearing Info: Adequate Speech Info: Adequate    SPECIAL CARE FACTORS FREQUENCY  PT (By licensed PT)     PT Frequency: Evaluated 3/15 and a minimum of 3X per week therapy recommended during acute inpatient stay.              Contractures Contractures Info: Not present    Additional Factors Info  Allergies, Code Status, Insulin Sliding Scale Code Status Info: DNR Allergies Info: Actos Pioglitazone, Hydrochloride, Felodipine Er, Hctz, Hydrochlorothiazide, Lisinopril, Plendil, Felodipine, Amlodipine Besylate, Brimonidine Tartrate, Clonidine Derivatives,  Combigan Brimonidine Tartrate-timolol, Dorzolamide, Monopril Fosinopril Sodium, Norvasc Amlodipine Besylate, Penicillins   Insulin Sliding Scale Info: 0-9 Units 3 times daily with meals       Current Medications (04/01/2017):  This is the current hospital active medication list Current Facility-Administered Medications  Medication Dose Route Frequency Provider Last Rate Last Dose  . acetaminophen (TYLENOL) tablet 650 mg  650 mg Oral Q6H PRN Bennie Pierini, MD   650 mg at 04/01/17 0510   Or  . acetaminophen (TYLENOL) suppository 650 mg  650 mg Rectal Q6H PRN Bennie Pierini, MD      . brinzolamide (AZOPT) 1 % ophthalmic suspension 1 drop  1 drop Right Eye BID Bennie Pierini, MD   1 drop at 04/01/17 1041  . butalbital-acetaminophen-caffeine (FIORICET, ESGIC) 50-325-40 MG per tablet 1 tablet  1 tablet Oral Q6H PRN Lavina Hamman, MD      . carvedilol (COREG) tablet 6.25 mg  6.25 mg Oral BID WC Bennie Pierini, MD   6.25 mg at 04/01/17 6195  . docusate sodium (COLACE) capsule 100 mg  100 mg Oral BID Bennie Pierini, MD   100 mg at 04/01/17 1048  .  feeding supplement (ENSURE ENLIVE) (ENSURE ENLIVE) liquid 237 mL  237 mL Oral BID BM Lavina Hamman, MD   237 mL at 04/01/17 1342  . ferrous sulfate tablet 325 mg  325 mg Oral Q breakfast Bennie Pierini, MD   325 mg at 04/01/17 0932  . fluticasone (FLONASE) 50 MCG/ACT nasal spray 1 spray  1 spray Each Nare Daily Lavina Hamman, MD   1 spray at 04/01/17 1040  . furosemide (LASIX) injection 40 mg  40 mg Intravenous BID Minus Breeding, MD   40 mg at 04/01/17 1042  . guaiFENesin (MUCINEX) 12 hr tablet 600 mg  600 mg Oral BID Lavina Hamman, MD   600 mg at 04/01/17 1047  . heparin injection 5,000 Units  5,000 Units Subcutaneous Q8H Bennie Pierini, MD   5,000 Units at 04/01/17 1342  . hydrALAZINE (APRESOLINE) tablet 10 mg  10 mg Oral Q8H PRN Minus Breeding, MD      . hydrALAZINE (APRESOLINE) tablet 50 mg  50 mg Oral TID Bennie Pierini, MD   50 mg at 04/01/17 1047  . insulin aspart (novoLOG) injection 0-9 Units  0-9 Units Subcutaneous TID WC Bennie Pierini, MD   1 Units at 03/31/17 1754  . ipratropium-albuterol (DUONEB) 0.5-2.5 (3) MG/3ML nebulizer solution 3 mL  3 mL Nebulization BID Lavina Hamman, MD   3 mL at 04/01/17 0847  . isosorbide mononitrate (IMDUR) 24 hr tablet 240 mg  240 mg Oral Daily Lavina Hamman, MD   240 mg at 04/01/17 1226  . latanoprost (XALATAN) 0.005 % ophthalmic solution 1 drop  1 drop Right Eye QHS Bennie Pierini, MD   1 drop at 03/31/17 2226  . morphine 2 MG/ML injection 2 mg  2 mg Intravenous Q3H PRN Lavina Hamman, MD      . nitroGLYCERIN (NITROSTAT) SL tablet 0.4 mg  0.4 mg Sublingual Q5 min PRN Lavina Hamman, MD   0.4 mg at 03/30/17 0900  . ondansetron (ZOFRAN) tablet 4 mg  4 mg Oral Q6H PRN Bennie Pierini, MD   4 mg at 03/29/17 2351   Or  . ondansetron (ZOFRAN) injection 4 mg  4 mg Intravenous Q6H PRN Bennie Pierini, MD      . pantoprazole (PROTONIX) EC tablet 40 mg  40 mg Oral Daily Bennie Pierini, MD   40 mg at 04/01/17 1047  .  predniSONE (DELTASONE) tablet 40 mg  40 mg Oral Q breakfast Lavina Hamman, MD   40 mg at 04/01/17 1048  . senna (SENOKOT) tablet 8.6 mg  1 tablet Oral BID Bennie Pierini, MD   8.6 mg at 04/01/17 1048  . sodium chloride (OCEAN) 0.65 % nasal spray 1 spray  1 spray Each Nare PRN Lavina Hamman, MD   1 spray at 03/31/17 0802  . sodium chloride flush (NS) 0.9 % injection 3 mL  3 mL Intravenous Q12H Bennie Pierini, MD   3 mL at 04/01/17 1057  . timolol (TIMOPTIC) 0.5 % ophthalmic solution 1 drop  1 drop Right Eye BID Schertz, Michele Mcalpine,  MD   1 drop at 04/01/17 1041     Discharge Medications: Please see discharge summary for a list of discharge medications.  Relevant Imaging Results:  Relevant Lab Results:   Additional Information ss#[id number]  Sable Feil, LCSW

## 2017-04-01 NOTE — Progress Notes (Signed)
Pt CBG was 287 tonight- she has no coverage. Night time MD/NP notified. Awaiting any further orders

## 2017-04-01 NOTE — Progress Notes (Signed)
[facility] PROGRESS NOTE  Kelly Ruiz YTK:[id number] DOB: Aug 15, 1923 DOA: 03/26/2017  PCP: Ronita Hipps, MD  Brief History/Interval Summary: 84 year old Caucasian female with a past medical history of chronic diastolic CHF, chronic kidney disease stage IV, insulin-dependent diabetes mellitus type 2, coronary artery disease, hypertension, history of diverticular bleed with chronic iron deficiency anemia presented with complains of fatigue, decreased oral intake and nausea vomiting for 2-3 days.  Denied any diarrhea.  Patient was found to have acute renal failure with hyperkalemia.  She was hospitalized for further management.  Reason for Visit: Acute renal failure with hyperkalemia.  Consultants: None yet  Procedures: None  Antibiotics: None  Subjective/Interval History: Continues to have shortness of breath, no chest pain.  No nausea no vomiting.  Oral intake remained minimal overnight as the patient did not feel like eating.  ROS: No headaches  Objective:  Vital Signs  Vitals:   04/01/17 0510 04/01/17 0807 04/01/17 0847 04/01/17 1023  BP: 138/73 (!) 207/72  (!) 184/55  Pulse: 65 64 77 65  Resp: 19  16 18   Temp: 98.3 F (36.8 C)   98 F (36.7 C)  TempSrc: Oral   Oral  SpO2: 97%  92% 94%  Weight:      Height:        Intake/Output Summary (Last 24 hours) at 04/01/2017 1610 Last data filed at 04/01/2017 1355 Gross per 24 hour  Intake 723 ml  Output 1601 ml  Net -878 ml   Filed Weights   03/31/17 0356 03/31/17 2020 04/01/17 0208  Weight: 65.3 kg (143 lb 15.4 oz) 65.2 kg (143 lb 11.8 oz) 65.2 kg (143 lb 11.8 oz)    General appearance: alert, cooperative and mild distress Head: Normocephalic, without obvious abnormality, atraumatic Resp: Diminished air entry at the bases but no crackles appreciated.  Bilateral expiratory wheezing.  Normal effort. Cardio: regular rate and rhythm, S1, S2 normal, no murmur, click, rub or gallop GI: soft, non-tender; bowel  sounds normal; no masses,  no organomegaly Extremities: extremities normal, atraumatic, no cyanosis or edema Neurologic: Awake alert.  Anxious.  No obvious focal neurological deficits.  Lab Results:  Data Reviewed: I have personally reviewed following labs and imaging studies  CBC: Recent Labs  Lab 03/26/17 1355 03/28/17 0706 03/30/17 0724 03/31/17 0048 04/01/17 0538  WBC 5.9 7.6 5.9 5.9 4.6  NEUTROABS 4.6  --   --   --   --   HGB 8.8* 8.2* 7.7* 9.3* 9.4*  HCT 27.4* 25.9* 24.7* 28.6* 28.8*  MCV 101.5* 102.4* 102.5* 102.5* 102.5*  PLT 125* 135* 121* 108* 107*    Basic Metabolic Panel: Recent Labs  Lab 03/26/17 1900  03/27/17 0432  03/28/17 0706 03/29/17 0601 03/30/17 0724 03/31/17 0048 04/01/17 0538  NA  --    < > 134*   < > 143 145 143 142 142  K  --    < > 6.3*   < > 3.8 3.7 3.8 4.1 4.2  CL  --    < > 106   < > 113* 118* 117* 116* 112*  CO2  --    < > 18*   < > 20* 20* 17* 19* 20*  GLUCOSE  --    < > 103*   < > 101* 108* 102* 108* 106*  BUN  --    < > 104*   < > 89* 75* 61* 57* 53*  CREATININE  --    < > 3.62*   < >  2.66* 2.20* 1.90* 2.03* 1.74*  CALCIUM  --    < > 8.9   < > 8.7* 8.6* 8.6* 8.6* 8.6*  MG 2.3  --  2.2  --   --  2.0  --   --   --   PHOS 5.2*  --  5.1*  --   --  4.1 3.5 4.3 3.9   < > = values in this interval not displayed.    GFR: Estimated Creatinine Clearance: 17.8 mL/min (A) (by C-G formula based on SCr of 1.74 mg/dL (H)).  Liver Function Tests: Recent Labs  Lab 03/26/17 1355 03/27/17 0432 03/29/17 0601 03/30/17 0724 03/31/17 0048 04/01/17 0538  AST 13* 14*  --   --   --   --   ALT 18 16  --   --   --   --   ALKPHOS 82 79  --   --   --   --   BILITOT 0.7 0.5  --   --   --   --   PROT 5.5* 4.7*  --   --   --   --   ALBUMIN 3.2* 2.8* 2.6* 2.6* 2.5* 2.6*    Recent Labs  Lab 03/26/17 1355  LIPASE 20    HbA1C: No results for input(s): HGBA1C in the last 72 hours.  CBG: Recent Labs  Lab 03/31/17 1159 03/31/17 1641  03/31/17 2018 04/01/17 0732 04/01/17 1149  GLUCAP 103* 144* 153* 101* 120*      Radiology Studies: No results found.   Medications:  Scheduled: . brinzolamide  1 drop Right Eye BID  . carvedilol  6.25 mg Oral BID WC  . docusate sodium  100 mg Oral BID  . feeding supplement (ENSURE ENLIVE)  237 mL Oral BID BM  . ferrous sulfate  325 mg Oral Q breakfast  . fluticasone  1 spray Each Nare Daily  . furosemide  40 mg Intravenous BID  . guaiFENesin  600 mg Oral BID  . heparin  5,000 Units Subcutaneous Q8H  . hydrALAZINE  50 mg Oral TID  . insulin aspart  0-9 Units Subcutaneous TID WC  . ipratropium-albuterol  3 mL Nebulization BID  . isosorbide mononitrate  240 mg Oral Daily  . latanoprost  1 drop Right Eye QHS  . pantoprazole  40 mg Oral Daily  . predniSONE  40 mg Oral Q breakfast  . senna  1 tablet Oral BID  . sodium chloride flush  3 mL Intravenous Q12H  . timolol  1 drop Right Eye BID   Continuous:  XBM:[id number] **OR** acetaminophen, butalbital-acetaminophen-caffeine, hydrALAZINE, morphine injection, nitroGLYCERIN, ondansetron **OR** ondansetron (ZOFRAN) IV, sodium chloride  Assessment/Plan:  Principal Problem:   AKI (acute kidney injury) ([place]) Active Problems:   Essential hypertension   Chest pain with moderate risk of acute coronary syndrome   Insulin dependent diabetes mellitus with renal manifestation ([facility])   Acute on chronic diastolic HF (heart failure) ([facility])    Acute renal failure on chronic kidney disease stage IV/non-anion gap metabolic acidosis/hyperkalemia Patient's creatinine was 1.63 and BUN was 53 and November 2018.  On admission it was 3.9 and BUN was 109.  She did have some nausea vomiting over the last few days.  Most likely this is prerenal.  Plus she was on diuretics and ARB at home.  Patient does not appear to have fluid overload.  She actually seems dehydrated.  She was started on IV fluids with some improvement in her creatinine but not  significantly  so.  She is making urine.  Potassium did improve with Kayexalate, calcium gluconate and dextrose/insulin.  Noncontrast CT scan did not show any structural issues in her GU tract.  Patient was given gentle IV hydration now that her volume appears adequately replenished will hold fluids given her history of CHF. Patient is getting diuresis for now, monitor renal function.  Nausea and vomiting Etiology unclear.  CT scan of the abdomen and pelvis did not show any obvious abnormalities.  She has not had any vomiting up on the floor yet.  Nausea and vomiting could be due to uremia.  Continue to watch for now.  History of coronary artery disease, chronic diastolic CHF, hypertension These issues appear to be stable.  Continue with home medications.  Holding her diuretics including Bumex.  Also holding her losartan due to worsening renal function. Resuming home Imdur.  Insulin-dependent diabetes mellitus with polyneuropathy She has been off of glycemic medications at home due to advanced age.  Continue with SSI.  Monitor CBGs.  Chronic microcytic anemia Hemoglobin close to baseline.  No evidence of overt bleeding.  Anticipate some dilutional drop.  Enterococcus UTI. Present on admission. Treat with fosfomycin x1.  Neuropathy. Acute encephalopathy. In response to gabapentin patient received last night. Continue to avoid psychotropic medications in this patient.  Chest pain.  Unstable angina Substernal chest pain question unstable angina. S/P 1 PRBC transfusion.  PRN nitroglycerin. May require IV nitroglycerin if her pain does not get better. May require heparin infusion if has recurrent chest pain. Troponin mildly elevated. EKG has some ST depression. Appreciate cartilage input. Not a good candidate for any intervention for now. Echocardiogram shows preserved EF without any wall motion abnormality.  Acute on chronic diastolic CHF. Patient was on oral diuresis, this was on  hold due to acute kidney injury with dehydration. Creatinine has improved and now cardiology recommending IV diuresis again.  Bilateral wheezing  acute viral bronchitis.. Likely in the setting of CHF. Possibility of acute bronchitis cannot be ruled out but more likely viral. Monitors supportive treatment with DuoNeb's. Oral prednisone.  Headache. As needed Tylenol.  As needed Fioricet.  DVT Prophylaxis: Subcutaneous heparin    Code Status: Full code Family Communication: No family at bedside.  Discussed with her son over the phone. Disposition Plan: Management as outlined above.     LOS: 6 days   [facility] 04/01/2017, 4:10 PM  If 7PM-7AM, please contact night-coverage at [email or web address], password [facility]

## 2017-04-01 NOTE — Progress Notes (Signed)
Physical Therapy Treatment Patient Details Name: Kelly Ruiz MRN: [id number] DOB: Feb 17, 1923 Today's Date: 04/01/2017    History of Present Illness 84 year old Caucasian female with a past medical history of chronic diastolic CHF, chronic kidney disease stage IV, insulin-dependent diabetes mellitus type 2, coronary artery disease, hypertension, history of diverticular bleed with chronic iron deficiency anemia presented with complains of fatigue, decreased oral intake and nausea vomiting for 2-3 days.  Denied any diarrhea.  Patient was found to have acute renal failure with hyperkalemia.     PT Comments    Patient seen for activity progression and further assessment of mobility. At this time, patient continues to demonstrate overall deconditioning and weakness. Spoke with patient and son present at bedside. At this time, feel patient would benefit from post acute rehabilitation and family would consider ST SNF placement upon acute discharge. Recommendations updated. Son would like to speak with social worker regarding planning.  Follow Up Recommendations  SNF;Supervision/Assistance - 24 hour     Equipment Recommendations  None recommended by PT    Recommendations for Other Services       Precautions / Restrictions Precautions Precautions: Fall Restrictions Weight Bearing Restrictions: No    Mobility  Bed Mobility Overal bed mobility: Needs Assistance Bed Mobility: Rolling;Supine to Sit;Sit to Supine Rolling: Min assist   Supine to sit: Min assist Sit to supine: Mod assist   General bed mobility comments: Min assist to intiate roll and elevate trunk to upright position, assist to bring hips forward to EOB, moderate assist to elevate BLEs back to bed and reposition  Transfers Overall transfer level: Needs assistance Equipment used: Rolling walker (2 wheeled) Transfers: Sit to/from Stand Sit to Stand: Min assist            Ambulation/Gait                  Stairs            Wheelchair Mobility    Modified Rankin (Stroke Patients Only)       Balance Overall balance assessment: Needs assistance Sitting-balance support: Feet supported Sitting balance-Leahy Scale: Fair Sitting balance - Comments: able to sit EOB but does report some unsteadiness in sitting, unable to take significant challange   Standing balance support: Bilateral upper extremity supported Standing balance-Leahy Scale: Poor                              Cognition Arousal/Alertness: Awake/alert Behavior During Therapy: Flat affect Overall Cognitive Status: Within Functional Limits for tasks assessed                                        Exercises      General Comments General comments (skin integrity, edema, etc.): patient reports sore buttocks/sacrum, positioned in sidelying to offload buttocks      Pertinent Vitals/Pain Pain Assessment: 0-10 Pain Score: 6  Pain Location: headache Pain Descriptors / Indicators: Aching Pain Intervention(s): Monitored during session    Home Living                      Prior Function            PT Goals (current goals can now be found in the care plan section) Acute Rehab PT Goals Patient Stated Goal: To return home PT Goal Formulation: With patient/family Time For  Goal Achievement: 04/12/17 Potential to Achieve Goals: Good Progress towards PT goals: Not progressing toward goals - comment(deconiditioning noted)    Frequency    Min 3X/week      PT Plan Discharge plan needs to be updated    Co-evaluation              AM-PAC PT "6 Clicks" Daily Activity  Outcome Measure  Difficulty turning over in bed (including adjusting bedclothes, sheets and blankets)?: A Lot Difficulty moving from lying on back to sitting on the side of the bed? : Unable Difficulty sitting down on and standing up from a chair with arms (e.g., wheelchair, bedside commode, etc,.)?:  Unable Help needed moving to and from a bed to chair (including a wheelchair)?: A Lot Help needed walking in hospital room?: A Little Help needed climbing 3-5 steps with a railing? : A Lot 6 Click Score: 11    End of Session Equipment Utilized During Treatment: Gait belt;Oxygen Activity Tolerance: Patient limited by fatigue(lightheaded this session) Patient left: with call bell/phone within reach;in chair;with family/visitor present   PT Visit Diagnosis: Unsteadiness on feet (R26.81);Other abnormalities of gait and mobility (R26.89);Muscle weakness (generalized) (M62.81)     Time: 3662-9476 PT Time Calculation (min) (ACUTE ONLY): 16 min  Charges:  $Therapeutic Activity: 8-22 mins                    G Codes:       Alben Deeds, PT DPT  Board Certified Neurologic Specialist [place] 04/01/2017, 10:03 AM

## 2017-04-01 NOTE — Progress Notes (Signed)
Pt's b/p has continued to be high. MD/NP on call notified. Waiting for any further orders.

## 2017-04-02 LAB — RENAL FUNCTION PANEL
Albumin: 2.5 g/dL — ABNORMAL LOW (ref 3.5–5.0)
Anion gap: 11 (ref 5–15)
BUN: 57 mg/dL — AB (ref 6–20)
CHLORIDE: 106 mmol/L (ref 101–111)
CO2: 23 mmol/L (ref 22–32)
Calcium: 8.9 mg/dL (ref 8.9–10.3)
Creatinine, Ser: 1.67 mg/dL — ABNORMAL HIGH (ref 0.44–1.00)
GFR calc Af Amer: 30 mL/min — ABNORMAL LOW (ref >60)
GFR calc non Af Amer: 26 mL/min — ABNORMAL LOW (ref >60)
Glucose, Bld: 119 mg/dL — ABNORMAL HIGH (ref 65–99)
POTASSIUM: 4.3 mmol/L (ref 3.5–5.1)
Phosphorus: 3.1 mg/dL (ref 2.5–4.6)
Sodium: 140 mmol/L (ref 135–145)

## 2017-04-02 LAB — CBC
HEMATOCRIT: 28.7 % — AB (ref 36.0–46.0)
Hemoglobin: 9.6 g/dL — ABNORMAL LOW (ref 12.0–15.0)
MCH: 33.7 pg (ref 26.0–34.0)
MCHC: 33.4 g/dL (ref 30.0–36.0)
MCV: 100.7 fL — AB (ref 78.0–100.0)
Platelets: 123 10*3/uL — ABNORMAL LOW (ref 150–400)
RBC: 2.85 MIL/uL — ABNORMAL LOW (ref 3.87–5.11)
RDW: 13.6 % (ref 11.5–15.5)
WBC: 5.4 10*3/uL (ref 4.0–10.5)

## 2017-04-02 LAB — GLUCOSE, CAPILLARY
GLUCOSE-CAPILLARY: 277 mg/dL — AB (ref 65–99)
GLUCOSE-CAPILLARY: 248 mg/dL — AB (ref 65–99)
Glucose-Capillary: 101 mg/dL — ABNORMAL HIGH (ref 65–99)
Glucose-Capillary: 260 mg/dL — ABNORMAL HIGH (ref 65–99)

## 2017-04-02 MED ORDER — HYDRALAZINE HCL 50 MG PO TABS
100.0000 mg | ORAL_TABLET | Freq: Three times a day (TID) | ORAL | Status: DC
  Administered 2017-04-02 – 2017-04-04 (×7): 100 mg via ORAL
  Filled 2017-04-02 (×7): qty 2

## 2017-04-02 NOTE — Progress Notes (Signed)
[facility] PROGRESS NOTE  Araya Roel HGD:[id number] DOB: [birth year] DOA: 03/26/2017  PCP: Ronita Hipps, MD  Brief History/Interval Summary: 84 year old Caucasian female with a past medical history of chronic diastolic CHF, chronic kidney disease stage IV, insulin-dependent diabetes mellitus type 2, coronary artery disease, hypertension, history of diverticular bleed with chronic iron deficiency anemia presented with complains of fatigue, decreased oral intake and nausea vomiting for 2-3 days.  Denied any diarrhea.  Patient was found to have acute renal failure with hyperkalemia.  She was hospitalized for further management.  Reason for Visit: Acute renal failure with hyperkalemia.  Consultants: None yet  Procedures: None  Antibiotics: None  Subjective/Interval History: Headache is better.  Breathing is about the same.  Occasional wheezing still noted.  No nausea no vomiting.  Minimal oral intake.  ROS: No headaches  Objective:  Vital Signs  Vitals:   04/02/17 0409 04/02/17 0816 04/02/17 0852 04/02/17 1636  BP: (!) 178/46 (!) 198/52  (!) 203/66  Pulse: 64 61  64  Resp: 18 20  18   Temp:  98.2 F (36.8 C)    TempSrc:  Oral    SpO2: 95% 95% 96% 97%  Weight:      Height:        Intake/Output Summary (Last 24 hours) at 04/02/2017 1703 Last data filed at 04/02/2017 1310 Gross per 24 hour  Intake 1077 ml  Output 1750 ml  Net -673 ml   Filed Weights   03/31/17 2020 04/01/17 0208 04/01/17 2058  Weight: 65.2 kg (143 lb 11.8 oz) 65.2 kg (143 lb 11.8 oz) 65.2 kg (143 lb 11.8 oz)    General appearance: alert, cooperative and mild distress Head: Normocephalic, without obvious abnormality, atraumatic Resp: Diminished air entry at the bases but no crackles appreciated.  Bilateral expiratory wheezing.  Normal effort. Cardio: regular rate and rhythm, S1, S2 normal, no murmur, click, rub or gallop GI: soft, non-tender; bowel sounds normal; no masses,  no  organomegaly Extremities: extremities normal, atraumatic, no cyanosis or edema Neurologic: Awake alert.  Anxious.  No obvious focal neurological deficits.  Lab Results:  Data Reviewed: I have personally reviewed following labs and imaging studies  CBC: Recent Labs  Lab 03/28/17 0706 03/30/17 0724 03/31/17 0048 04/01/17 0538 04/02/17 0523  WBC 7.6 5.9 5.9 4.6 5.4  HGB 8.2* 7.7* 9.3* 9.4* 9.6*  HCT 25.9* 24.7* 28.6* 28.8* 28.7*  MCV 102.4* 102.5* 102.5* 102.5* 100.7*  PLT 135* 121* 108* 107* 123*    Basic Metabolic Panel: Recent Labs  Lab 03/26/17 1900  03/27/17 0432  03/29/17 0601 03/30/17 0724 03/31/17 0048 04/01/17 0538 04/02/17 0523  NA  --    < > 134*   < > 145 143 142 142 140  K  --    < > 6.3*   < > 3.7 3.8 4.1 4.2 4.3  CL  --    < > 106   < > 118* 117* 116* 112* 106  CO2  --    < > 18*   < > 20* 17* 19* 20* 23  GLUCOSE  --    < > 103*   < > 108* 102* 108* 106* 119*  BUN  --    < > 104*   < > 75* 61* 57* 53* 57*  CREATININE  --    < > 3.62*   < > 2.20* 1.90* 2.03* 1.74* 1.67*  CALCIUM  --    < > 8.9   < > 8.6* 8.6* 8.6*  8.6* 8.9  MG 2.3  --  2.2  --  2.0  --   --   --   --   PHOS 5.2*  --  5.1*  --  4.1 3.5 4.3 3.9 3.1   < > = values in this interval not displayed.    GFR: Estimated Creatinine Clearance: 18.5 mL/min (A) (by C-G formula based on SCr of 1.67 mg/dL (H)).  Liver Function Tests: Recent Labs  Lab 03/27/17 0432 03/29/17 0601 03/30/17 0724 03/31/17 0048 04/01/17 0538 04/02/17 0523  AST 14*  --   --   --   --   --   ALT 16  --   --   --   --   --   ALKPHOS 79  --   --   --   --   --   BILITOT 0.5  --   --   --   --   --   PROT 4.7*  --   --   --   --   --   ALBUMIN 2.8* 2.6* 2.6* 2.5* 2.6* 2.5*    No results for input(s): LIPASE, AMYLASE in the last 168 hours.  HbA1C: No results for input(s): HGBA1C in the last 72 hours.  CBG: Recent Labs  Lab 04/01/17 1149 04/01/17 1658 04/01/17 2056 04/02/17 0735 04/02/17 1151  GLUCAP 120*  205* 287* 101* 260*      Radiology Studies: No results found.   Medications:  Scheduled: . brinzolamide  1 drop Right Eye BID  . carvedilol  6.25 mg Oral BID WC  . docusate sodium  100 mg Oral BID  . feeding supplement (ENSURE ENLIVE)  237 mL Oral BID BM  . ferrous sulfate  325 mg Oral Q breakfast  . fluticasone  1 spray Each Nare Daily  . furosemide  40 mg Intravenous BID  . guaiFENesin  600 mg Oral BID  . heparin  5,000 Units Subcutaneous Q8H  . hydrALAZINE  100 mg Oral TID  . insulin aspart  0-9 Units Subcutaneous TID WC  . ipratropium-albuterol  3 mL Nebulization BID  . isosorbide mononitrate  240 mg Oral Daily  . latanoprost  1 drop Right Eye QHS  . pantoprazole  40 mg Oral Daily  . predniSONE  40 mg Oral Q breakfast  . senna  1 tablet Oral BID  . sodium chloride flush  3 mL Intravenous Q12H  . timolol  1 drop Right Eye BID   Continuous:  TDS:[id number] **OR** acetaminophen, butalbital-acetaminophen-caffeine, hydrALAZINE, morphine injection, nitroGLYCERIN, ondansetron **OR** ondansetron (ZOFRAN) IV, sodium chloride  Assessment/Plan:  Principal Problem:   AKI (acute kidney injury) ([place]) Active Problems:   Essential hypertension   Chest pain with moderate risk of acute coronary syndrome   Insulin dependent diabetes mellitus with renal manifestation (HCC)   Acute on chronic diastolic HF (heart failure) (HCC)    Acute renal failure on chronic kidney disease stage IV/non-anion gap metabolic acidosis/hyperkalemia Patient's creatinine was 1.63 and BUN was 53 and November 2018.  On admission it was 3.9 and BUN was 109.  She did have some nausea vomiting over the last few days.  Most likely this is prerenal.  Plus she was on diuretics and ARB at home.  Patient does not appear to have fluid overload.  She actually seems dehydrated.  She was started on IV fluids with some improvement in her creatinine but not significantly so.  She is making urine.  Potassium did improve  with Kayexalate, calcium gluconate  and dextrose/insulin.  Noncontrast CT scan did not show any structural issues in her GU tract.  Patient was given gentle IV hydration now that her volume appears adequately replenished will hold fluids given her history of CHF. Patient is getting diuresis for now, monitor renal function.  Nausea and vomiting Etiology unclear.  CT scan of the abdomen and pelvis did not show any obvious abnormalities.  She has not had any vomiting up on the floor yet.  Nausea and vomiting could be due to uremia.  Continue to watch for now.  History of coronary artery disease, chronic diastolic CHF, hypertension These issues appear to be stable.  Continue with home medications.  Holding her diuretics including Bumex.  Also holding her losartan due to worsening renal function. Resuming home Imdur.  Insulin-dependent diabetes mellitus with polyneuropathy She has been off of glycemic medications at home due to advanced age.  Continue with SSI.  Monitor CBGs.  Chronic microcytic anemia Hemoglobin close to baseline.  No evidence of overt bleeding.  Anticipate some dilutional drop.  Enterococcus UTI. Present on admission. Treat with fosfomycin x1.  Neuropathy. Acute encephalopathy. In response to gabapentin patient received last night. Continue to avoid psychotropic medications in this patient.  Chest pain.  Unstable angina Substernal chest pain question unstable angina. S/P 1 PRBC transfusion.  PRN nitroglycerin. May require IV nitroglycerin if her pain does not get better. May require heparin infusion if has recurrent chest pain. Troponin mildly elevated. EKG has some ST depression. Appreciate cartilage input. Not a good candidate for any intervention for now. Echocardiogram shows preserved EF without any wall motion abnormality.  Acute on chronic diastolic CHF. Patient was on oral diuresis, this was on hold due to acute kidney injury with dehydration. Creatinine  has improved and now cardiology recommending IV diuresis again.  Bilateral wheezing  acute viral bronchitis.. Likely in the setting of CHF. Possibility of acute bronchitis cannot be ruled out but more likely viral. Monitors supportive treatment with DuoNeb's. Oral prednisone.  Headache. As needed Tylenol.  As needed Fioricet.  DVT Prophylaxis: Subcutaneous heparin    Code Status: Full code Family Communication: No family at bedside.  Discussed with her son over the phone. Disposition Plan: Hopefully to SNF in 1-2 day.     LOS: 7 days   [facility] 04/02/2017, 5:03 PM  If 7PM-7AM, please contact night-coverage at [email or web address], password [facility]

## 2017-04-02 NOTE — Progress Notes (Signed)
Progress Note  Patient Name: Kelly Ruiz Date of Encounter: 04/02/2017  Primary Cardiologist: Minus Breeding, MD   Subjective   Feeling better.  Still feeling very weak.    Inpatient Medications    Scheduled Meds: . brinzolamide  1 drop Right Eye BID  . carvedilol  6.25 mg Oral BID WC  . docusate sodium  100 mg Oral BID  . feeding supplement (ENSURE ENLIVE)  237 mL Oral BID BM  . ferrous sulfate  325 mg Oral Q breakfast  . fluticasone  1 spray Each Nare Daily  . furosemide  40 mg Intravenous BID  . guaiFENesin  600 mg Oral BID  . heparin  5,000 Units Subcutaneous Q8H  . hydrALAZINE  100 mg Oral TID  . insulin aspart  0-9 Units Subcutaneous TID WC  . ipratropium-albuterol  3 mL Nebulization BID  . isosorbide mononitrate  240 mg Oral Daily  . latanoprost  1 drop Right Eye QHS  . pantoprazole  40 mg Oral Daily  . predniSONE  40 mg Oral Q breakfast  . senna  1 tablet Oral BID  . sodium chloride flush  3 mL Intravenous Q12H  . timolol  1 drop Right Eye BID   Continuous Infusions:  PRN Meds: acetaminophen **OR** acetaminophen, butalbital-acetaminophen-caffeine, hydrALAZINE, morphine injection, nitroGLYCERIN, ondansetron **OR** ondansetron (ZOFRAN) IV, sodium chloride   Vital Signs    Vitals:   04/02/17 0108 04/02/17 0409 04/02/17 0816 04/02/17 0852  BP: (!) 177/50 (!) 178/46 (!) 198/52   Pulse: 68 64 61   Resp: 18 18 20    Temp:   98.2 F (36.8 C)   TempSrc:   Oral   SpO2: 96% 95% 95% 96%  Weight:      Height:        Intake/Output Summary (Last 24 hours) at 04/02/2017 0913 Last data filed at 04/02/2017 0537 Gross per 24 hour  Intake 483 ml  Output 2401 ml  Net -1918 ml   Filed Weights   03/31/17 2020 04/01/17 0208 04/01/17 2058  Weight: 143 lb 11.8 oz (65.2 kg) 143 lb 11.8 oz (65.2 kg) 143 lb 11.8 oz (65.2 kg)    Telemetry    Sinus rhythm.  No events.   - Personally Reviewed  ECG    n/a - Personally Reviewed  Physical Exam   VS:  BP (!) 198/52  (BP Location: Right Arm)   Pulse 61   Temp 98.2 F (36.8 C) (Oral)   Resp 20   Ht 5' (1.524 m)   Wt 143 lb 11.8 oz (65.2 kg)   SpO2 96%   BMI 28.07 kg/m  , BMI Body mass index is 28.07 kg/m. GENERAL:  Well appearing elderly woman in no acute distress. HEENT: Pupils equal round and reactive, fundi not visualized, oral mucosa unremarkable NECK:  No jugular venous distention, waveform within normal limits, carotid upstroke brisk and symmetric, no bruits LUNGS:  Clear to auscultation bilaterally HEART:  RRR.  PMI not displaced or sustained,S1 and S2 within normal limits, no S3, no S4, no clicks, no rubs, no murmurs ABD:  Flat, positive bowel sounds normal in frequency in pitch, no bruits, no rebound, no guarding, no midline pulsatile mass, no hepatomegaly, no splenomegaly EXT:  2 plus pulses throughout, no edema, no cyanosis no clubbing SKIN:  No rashes no nodules NEURO:  Cranial nerves II through XII grossly intact, motor grossly intact throughout PSYCH:  Cognitively intact, oriented to person place and time   [employer]  Recent Labs  Lab 03/26/17 1355  03/27/17 0432  03/31/17 0048 04/01/17 0538 04/02/17 0523  NA 132*   < > 134*   < > 142 142 140  K 6.2*   < > 6.3*   < > 4.1 4.2 4.3  CL 103   < > 106   < > 116* 112* 106  CO2 18*   < > 18*   < > 19* 20* 23  GLUCOSE 111*   < > 103*   < > 108* 106* 119*  BUN 109*   < > 104*   < > 57* 53* 57*  CREATININE 3.90*   < > 3.62*   < > 2.03* 1.74* 1.67*  CALCIUM 9.0   < > 8.9   < > 8.6* 8.6* 8.9  PROT 5.5*  --  4.7*  --   --   --   --   ALBUMIN 3.2*  --  2.8*   < > 2.5* 2.6* 2.5*  AST 13*  --  14*  --   --   --   --   ALT 18  --  16  --   --   --   --   ALKPHOS 82  --  79  --   --   --   --   BILITOT 0.7  --  0.5  --   --   --   --   GFRNONAA 9*   < > 10*   < > 20* 24* 26*  GFRAA 11*   < > 12*   < > 24* 28* 30*  ANIONGAP 11   < > 10   < > 7 10 11    < > = values in this interval not displayed.     Hematology Recent Labs    Lab 03/31/17 0048 04/01/17 0538 04/02/17 0523  WBC 5.9 4.6 5.4  RBC 2.79* 2.81* 2.85*  HGB 9.3* 9.4* 9.6*  HCT 28.6* 28.8* 28.7*  MCV 102.5* 102.5* 100.7*  MCH 33.3 33.5 33.7  MCHC 32.5 32.6 33.4  RDW 14.4 14.2 13.6  PLT 108* 107* 123*    Cardiac Enzymes Recent Labs  Lab 03/30/17 1042 03/30/17 1924 03/31/17 0048  TROPONINI 0.07* 0.18* 0.19*   No results for input(s): TROPIPOC in the last 168 hours.   BNP Recent Labs  Lab 03/30/17 1042  BNP 1,844.6*     DDimer No results for input(s): DDIMER in the last 168 hours.   Radiology    No results found.  Cardiac Studies   ECHO 03/31/17  Study Conclusions  - Left ventricle: The cavity size was normal. Wall thickness was increased in a pattern of moderate LVH. Systolic function was normal. The estimated ejection fraction was in the range of 60% to 65%. Wall motion was normal; there were no regional wall motion abnormalities. Features are consistent with a pseudonormal left ventricular filling pattern, with concomitant abnormal relaxation and increased filling pressure (grade 2 diastolic dysfunction). - Mitral valve: Severely calcified annulus. There was moderate regurgitation. - Left atrium: The atrium was moderately dilated. - Pulmonary arteries: Systolic pressure was moderately increased. PA peak pressure: 50 mm Hg (S).    Patient Profile     84 y.o. female with chronic diastolic heart failure, hypertension, CKD 3, diabetes, CAD, prior GI bleed,  and moderately elevated pulmonary pressures here with nausea, vomiting and AKI. Cardiology consulted for chest pain and elevated troponin.    Assessment & Plan    # Acute on chronic  diastolic heart failure: Renal function improving with diuresis.  Continue IV lasix.  Continue carvedilol, hydralazine and imdur.  #  Elevated troponin:  No plans for intervention. LVEF normal on echo this admission. Troponin mildly elevated.    # Hypertensive  heart disease:  BP remains elevated.  Continue carvedilol, hydralazine, Imdur.  No ARB 2/2 AoCKD.  Allergies to clonidine and amlodipine.  Hydralazine was increased to 100mg  tid today. If BP remains elevated would add doxazosin 2mg  qhs.  # Acute kidney injury:  Creatinine slowly improving.  Downt o 1.67 today.    For questions or updates, please contact [place] Please consult [email or web address] for contact info under Cardiology/STEMI.      Signed, Skeet Latch, MD  04/02/2017, 9:13 AM

## 2017-04-02 NOTE — Progress Notes (Signed)
Patient CBG 248 tonight. NP on call notified. Received call back. No further orders.

## 2017-04-02 NOTE — Consult Note (Signed)
   [facility] CM Inpatient Consult   04/02/2017  Tulsi Crossett 1923/08/26 [id number]  Patient screened for [place] services which she has personal care services.  Her son Hoy Morn is the primary care giver for decisions as well. Patient and son are currently considering ST SNF for rehab. Patient is in [facility].  Patient is in the [place] [facility] the [place] [facility] services under patient's Medicare plan.  No family currently in the room. For questions contact:   Natividad Brood, RN BSN [facility] Liaison  [phone] business mobile phone Toll free office [phone]

## 2017-04-02 NOTE — Progress Notes (Signed)
Inpatient Diabetes Program Recommendations  AACE/ADA: New Consensus Statement on Inpatient Glycemic Control (2015)  Target Ranges:  Prepandial:   less than 140 mg/dL      Peak postprandial:   less than 180 mg/dL (1-2 hours)      Critically ill patients:  140 - 180 mg/dL   Lab Results  Component Value Date   GLUCAP 101 (H) 04/02/2017   HGBA1C 5.5 03/26/2017    Review of Glycemic Control Results for Kelly Ruiz, Kelly Ruiz (MRN [id number]) as of 04/02/2017 10:15  Ref. Range 04/01/2017 11:49 04/01/2017 16:58 04/01/2017 20:56 04/02/2017 07:35  Glucose-Capillary Latest Ref Range: 65 - 99 mg/dL 120 (H) 205 (H) 287 (H) 101 (H)   Diabetes history: Type 2 DM Outpatient Diabetes medications: Lantus 2 units QHs Current orders for Inpatient glycemic control: Novolog 0-9 units TID  Inpatient Diabetes Program Recommendations:    If nighttime BS continue to remain elevated (>180mg /dL) would adding on Novolog 0-5 units QHS.  Thanks, Bronson Curb, MSN, RNC-OB Diabetes Coordinator [phone] (8a-5p)

## 2017-04-02 NOTE — Clinical Social Work Note (Signed)
Clinical Social Work Assessment  Patient Details  Name: Kelly Ruiz MRN: [id number] Date of Birth: [birth year]  Date of referral:  03/31/17               Reason for consult:  Facility Placement, Discharge Planning                Permission sought to share information with:  Family Supports Permission granted to share information::  No  Name::     Kelly Ruiz(Patient allowed son to talk with CSW at bedside)  Agency::     Relationship::  Son  Contact Information:  [phone]  Housing/Transportation Living arrangements for the past 2 months:  Single Family Home Source of Information:  Adult Children(Son Kelly Severin) Patient Interpreter Needed:  None Criminal Activity/Legal Involvement Pertinent to Current Situation/Hospitalization:  No - Comment as needed Significant Relationships:  Adult Children Lives with:  Spouse Do you feel safe going back to the place where you live?  No(Son in agreement with ST rehab prior to his mom returning home) Need for family participation in patient care:  Yes (Comment)  Care giving concerns:  Son Kelly Severin expressed agreement with ST rehab for his mother as his father would not be able to assist with patient's care at home.  Social Worker assessment / plan:  On 3/18 CSW talked with patient's son outside of the room and at the bedside regarding discharge disposition and recommendation of ST rehab. Patient was lying in bed and greeted CSW when spoken to, but was quiet during the conversation. Son Kelly Severin reported that he has 2 sisters and is the [place] for patient and has provided the paperwork during a prior admission. Mr. Solorio reported that help (bathing, cooking) is in the home  M-F from 8 am - 4 pm, then from 5:15 pm - 10 pm and from 10 pm to 8 am. On weekends help is there from 10 pm to 8 am.   Son reported that his mom has been to [facility] twice and he has not been totally pleased the MD and nursing care provided, however he did want  facility to be sent his mother's clinical information. He also requested patient's information be sent to other SNF's in [facility] with the exception of one facility and SNF's in [place], with the exception of [facility], as he heard they had a flu outbreak in their facility.  Employment status:  Retired Health visitor, Managed Care PT Recommendations:  [place] / Referral to community resources:  [place] Facility(Son provided with SNF list for [employer] and surrounding counties)  Patient/Family's Response to care:  Son expressed no concerns on 3/18 regarding patient's care during hospitalization.  Patient/Family's Understanding of and Emotional Response to Diagnosis, Current Treatment, and Prognosis:  Son expressed understanding of his mother's medical issues and need for rehab in a skilled facility prior to going home.  Emotional Assessment Appearance:  Appears stated age Attitude/Demeanor/Rapport:  Other(Appropriate) Affect (typically observed):  Appropriate, Quiet Orientation:  Oriented to Self, Oriented to Situation, Oriented to Place, Oriented to  Time Alcohol / Substance use:  Tobacco Use, Alcohol Use, Illicit Drugs(Patient reported that she has never smoked and does not consume alcohol or use illicit drugs) Psych involvement (Current and /or in the community):  No (Comment)  Discharge Needs  Concerns to be addressed:  Discharge Planning Concerns Readmission within the last 30 days:  No Current discharge risk:  None Barriers to Discharge:  Continued Medical Work up   [employer] Mila Homer, LCSW 04/02/2017, 1:54 PM

## 2017-04-02 NOTE — Clinical Social Work Placement (Signed)
   CLINICAL SOCIAL WORK PLACEMENT  NOTE **04/02/16 - SEE ADDITIONAL COMMENT SECTION BELOW RE: FACILITY CHOICE.  Date:  04/02/2017  Patient Details  Name: Kelly Ruiz MRN: [id number] Date of Birth: 01-Jul-1923  Clinical Social Work is seeking post-discharge placement for this patient at the [place] level of care (*CSW will initial, date and re-position this form in  chart as items are completed):  Yes   Patient/family provided with [place] Work Department's list of facilities offering this level of care within the geographic area requested by the patient (or if unable, by the patient's family).  Yes   Patient/family informed of their freedom to choose among providers that offer the needed level of care, that participate in Medicare, Medicaid or managed care program needed by the patient, have an available bed and are willing to accept the patient.  Yes   Patient/family informed of [facility]'s ownership interest in [facility] and [facility], as well as of the fact that they are under no obligation to receive care at these facilities.  PASRR submitted to EDS on       PASRR number received on       Existing PASRR number confirmed on 04/01/17     FL2 transmitted to all facilities in geographic area requested by pt/family on 04/01/17     FL2 transmitted to all facilities within larger geographic area on       Patient informed that his/her managed care company has contracts with or will negotiate with certain facilities, including the following:        Yes   Patient/family informed of bed offers received.  Patient chooses bed at       Physician recommends and patient chooses bed at      Patient to be transferred to   on  .  Patient to be transferred to facility by       Patient family notified on   of transfer.  Name of family member notified:        PHYSICIAN      Additional Comment:  04/02/17 - Met with son and probable  facility choice is [facility] in [place], [place]. [name] has contacted admissions director at facility and plans to visit SNF this afternoon.[name] He will contact CSW regarding his final decision, as patient will d/c on Wednesday, 3/20.  _______________________________________________ Sable Feil, LCSW 04/02/2017, 3:56 PM

## 2017-04-03 LAB — CBC
HCT: 28.9 % — ABNORMAL LOW (ref 36.0–46.0)
Hemoglobin: 9.4 g/dL — ABNORMAL LOW (ref 12.0–15.0)
MCH: 32.9 pg (ref 26.0–34.0)
MCHC: 32.5 g/dL (ref 30.0–36.0)
MCV: 101 fL — AB (ref 78.0–100.0)
Platelets: 133 10*3/uL — ABNORMAL LOW (ref 150–400)
RBC: 2.86 MIL/uL — ABNORMAL LOW (ref 3.87–5.11)
RDW: 13.4 % (ref 11.5–15.5)
WBC: 7.7 10*3/uL (ref 4.0–10.5)

## 2017-04-03 LAB — RENAL FUNCTION PANEL
Albumin: 2.5 g/dL — ABNORMAL LOW (ref 3.5–5.0)
Anion gap: 10 (ref 5–15)
BUN: 62 mg/dL — AB (ref 6–20)
CHLORIDE: 103 mmol/L (ref 101–111)
CO2: 23 mmol/L (ref 22–32)
CREATININE: 1.59 mg/dL — AB (ref 0.44–1.00)
Calcium: 8.8 mg/dL — ABNORMAL LOW (ref 8.9–10.3)
GFR calc non Af Amer: 27 mL/min — ABNORMAL LOW (ref >60)
GFR, EST AFRICAN AMERICAN: 32 mL/min — AB (ref >60)
Glucose, Bld: 125 mg/dL — ABNORMAL HIGH (ref 65–99)
POTASSIUM: 4 mmol/L (ref 3.5–5.1)
Phosphorus: 2.6 mg/dL (ref 2.5–4.6)
Sodium: 136 mmol/L (ref 135–145)

## 2017-04-03 LAB — GLUCOSE, CAPILLARY
GLUCOSE-CAPILLARY: 367 mg/dL — AB (ref 65–99)
GLUCOSE-CAPILLARY: 285 mg/dL — AB (ref 65–99)
Glucose-Capillary: 103 mg/dL — ABNORMAL HIGH (ref 65–99)
Glucose-Capillary: 254 mg/dL — ABNORMAL HIGH (ref 65–99)

## 2017-04-03 MED ORDER — DOXAZOSIN MESYLATE 2 MG PO TABS
2.0000 mg | ORAL_TABLET | Freq: Every day | ORAL | Status: DC
  Administered 2017-04-03: 2 mg via ORAL
  Filled 2017-04-03: qty 1

## 2017-04-03 MED ORDER — FUROSEMIDE 40 MG PO TABS
40.0000 mg | ORAL_TABLET | Freq: Two times a day (BID) | ORAL | Status: DC
  Administered 2017-04-03 – 2017-04-04 (×2): 40 mg via ORAL
  Filled 2017-04-03 (×2): qty 1

## 2017-04-03 MED ORDER — PREDNISONE 20 MG PO TABS: 30.0000 mg | ORAL_TABLET | Freq: Every day | ORAL | Status: DC

## 2017-04-03 NOTE — Progress Notes (Signed)
[facility] PROGRESS NOTE  Kelly Ruiz ZHG:[id number] DOB: 02/05/1923 DOA: 03/26/2017  PCP: Ronita Hipps, MD  Brief History/Interval Summary: 84 year old Caucasian female with a past medical history of chronic diastolic CHF, chronic kidney disease stage IV, insulin-dependent diabetes mellitus type 2, coronary artery disease, hypertension, history of diverticular bleed with chronic iron deficiency anemia presented with complains of fatigue, decreased oral intake and nausea vomiting for 2-3 days.  Denied any diarrhea.  Patient was found to have acute renal failure with hyperkalemia.  She was hospitalized for further management.  Reason for Visit: Acute renal failure with hyperkalemia.  Consultants: None yet  Procedures: None  Antibiotics: None  Subjective/Interval History: Breathing is better.  Headache is about the same.  No wheezing noted.  No nausea no vomiting.  improving oral intake.  ROS: No headaches  Objective:  Vital Signs  Vitals:   04/03/17 0523 04/03/17 0633 04/03/17 0755 04/03/17 0900  BP: (!) 189/54 (!) 158/41  (!) 175/55  Pulse: 63 61  67  Resp:    18  Temp:    98 F (36.7 C)  TempSrc:    Oral  SpO2:   98% 98%  Weight:      Height:        Intake/Output Summary (Last 24 hours) at 04/03/2017 1636 Last data filed at 04/03/2017 1318 Gross per 24 hour  Intake 820 ml  Output 1900 ml  Net -1080 ml   Filed Weights   04/01/17 0208 04/01/17 2058 04/02/17 2035  Weight: 65.2 kg (143 lb 11.8 oz) 65.2 kg (143 lb 11.8 oz) 65.3 kg (143 lb 15.4 oz)    General appearance: alert, cooperative and mild distress Head: Normocephalic, without obvious abnormality, atraumatic Resp: Diminished air entry at the bases but no crackles appreciated.  Bilateral expiratory wheezing.  Normal effort. Cardio: regular rate and rhythm, S1, S2 normal, no murmur, click, rub or gallop GI: soft, non-tender; bowel sounds normal; no masses,  no organomegaly Extremities:  extremities normal, atraumatic, no cyanosis or edema Neurologic: Awake alert.  Anxious.  No obvious focal neurological deficits.  Lab Results:  Data Reviewed: I have personally reviewed following labs and imaging studies  CBC: Recent Labs  Lab 03/30/17 0724 03/31/17 0048 04/01/17 0538 04/02/17 0523 04/03/17 0517  WBC 5.9 5.9 4.6 5.4 7.7  HGB 7.7* 9.3* 9.4* 9.6* 9.4*  HCT 24.7* 28.6* 28.8* 28.7* 28.9*  MCV 102.5* 102.5* 102.5* 100.7* 101.0*  PLT 121* 108* 107* 123* 133*    Basic Metabolic Panel: Recent Labs  Lab 03/29/17 0601 03/30/17 0724 03/31/17 0048 04/01/17 0538 04/02/17 0523 04/03/17 0517  NA 145 143 142 142 140 136  K 3.7 3.8 4.1 4.2 4.3 4.0  CL 118* 117* 116* 112* 106 103  CO2 20* 17* 19* 20* 23 23  GLUCOSE 108* 102* 108* 106* 119* 125*  BUN 75* 61* 57* 53* 57* 62*  CREATININE 2.20* 1.90* 2.03* 1.74* 1.67* 1.59*  CALCIUM 8.6* 8.6* 8.6* 8.6* 8.9 8.8*  MG 2.0  --   --   --   --   --   PHOS 4.1 3.5 4.3 3.9 3.1 2.6    GFR: Estimated Creatinine Clearance: 19.4 mL/min (A) (by C-G formula based on SCr of 1.59 mg/dL (H)).  Liver Function Tests: Recent Labs  Lab 03/30/17 0724 03/31/17 0048 04/01/17 0538 04/02/17 0523 04/03/17 0517  ALBUMIN 2.6* 2.5* 2.6* 2.5* 2.5*    No results for input(s): LIPASE, AMYLASE in the last 168 hours.  HbA1C: No results for input(s):  HGBA1C in the last 72 hours.  CBG: Recent Labs  Lab 04/02/17 1151 04/02/17 1712 04/02/17 2031 04/03/17 0754 04/03/17 1145  GLUCAP 260* 277* 248* 103* 254*      Radiology Studies: No results found.   Medications:  Scheduled: . brinzolamide  1 drop Right Eye BID  . carvedilol  6.25 mg Oral BID WC  . docusate sodium  100 mg Oral BID  . doxazosin  2 mg Oral QHS  . feeding supplement (ENSURE ENLIVE)  237 mL Oral BID BM  . ferrous sulfate  325 mg Oral Q breakfast  . fluticasone  1 spray Each Nare Daily  . furosemide  40 mg Oral BID  . guaiFENesin  600 mg Oral BID  . heparin   5,000 Units Subcutaneous Q8H  . hydrALAZINE  100 mg Oral TID  . insulin aspart  0-9 Units Subcutaneous TID WC  . ipratropium-albuterol  3 mL Nebulization BID  . isosorbide mononitrate  240 mg Oral Daily  . latanoprost  1 drop Right Eye QHS  . pantoprazole  40 mg Oral Daily  . predniSONE  40 mg Oral Q breakfast  . senna  1 tablet Oral BID  . sodium chloride flush  3 mL Intravenous Q12H  . timolol  1 drop Right Eye BID   Continuous:  PQD:IYMEBRAXENMMH **OR** acetaminophen, butalbital-acetaminophen-caffeine, hydrALAZINE, morphine injection, nitroGLYCERIN, ondansetron **OR** ondansetron (ZOFRAN) IV, sodium chloride  Assessment/Plan:  Principal Problem:   AKI (acute kidney injury) ([place]) Active Problems:   Essential hypertension   Chest pain with moderate risk of acute coronary syndrome   Insulin dependent diabetes mellitus with renal manifestation (HCC)   Acute on chronic diastolic HF (heart failure) (HCC)    Acute renal failure on chronic kidney disease stage IV/non-anion gap metabolic acidosis/hyperkalemia Patient's creatinine was 1.63 and BUN was 53 and November 2018.  On admission it was 3.9 and BUN was 109.  She did have some nausea vomiting over the last few days.  Most likely this is prerenal.  Plus she was on diuretics and ARB at home.  Patient does not appear to have fluid overload.  She actually seems dehydrated.  She was started on IV fluids with some improvement in her creatinine.  She is making urine.  Potassium did improve with Kayexalate, calcium gluconate and dextrose/insulin.  Noncontrast CT scan did not show any structural issues in her GU tract.   Nausea and vomiting Etiology unclear.  CT scan of the abdomen and pelvis did not show any obvious abnormalities.  She has not had any vomiting up on the floor yet.  Nausea and vomiting could be due to uremia.  Continue to watch for now.  History of coronary artery disease, chronic diastolic CHF, hypertension These issues  appear to be stable.  Continue with home medications.   Also holding her losartan due to worsening renal function would stop it on discharge Resuming home Imdur. On lasix IV till today and switching to PO, monitor renal function  Insulin-dependent diabetes mellitus with polyneuropathy She has been off of glycemic medications at home due to advanced age.  Continue with SSI.  Monitor CBGs.  Chronic microcytic anemia Hemoglobin close to baseline.  No evidence of overt bleeding.  Anticipate some dilutional drop.  Enterococcus UTI. Present on admission. Treat with fosfomycin x1.  Neuropathy. Acute encephalopathy. In response to gabapentin patient received last night. Continue to avoid psychotropic medications in this patient.  Chest pain.  Unstable angina Substernal chest pain question unstable angina. S/P 1  PRBC transfusion.  PRN nitroglycerin. May require IV nitroglycerin if her pain does not get better. May require heparin infusion if has recurrent chest pain. Troponin mildly elevated. EKG has some ST depression. Appreciate cartilage input. Not a good candidate for any intervention for now. Echocardiogram shows preserved EF without any wall motion abnormality.  Acute on chronic diastolic CHF. Patient was on oral diuresis, this was on hold due to acute kidney injury with dehydration. Creatinine has improved and now cardiology recommended IV diuresis. Now switch to PO  Bilateral wheezing  acute viral bronchitis.. Likely in the setting of CHF. Possibility of acute bronchitis cannot be ruled out but more likely viral. Monitors supportive treatment with DuoNeb's. Oral prednisone.  Headache. As needed Tylenol.  As needed Fioricet.  DVT Prophylaxis: Subcutaneous heparin    Code Status: Full code Family Communication: No family at bedside.  Discussed with her son over the phone. Disposition Plan: Hopefully to SNF tomorrow     LOS: 8 days   Berle Mull  [facility] 04/03/2017, 4:36 PM  If 7PM-7AM, please contact night-coverage at [email or web address], password [facility]

## 2017-04-03 NOTE — Progress Notes (Signed)
Physical Therapy Treatment Patient Details Name: Kelly Ruiz MRN: [id number] DOB: Sep 11, 1923 Today's Date: 04/03/2017    History of Present Illness Pt is a 84 year old Caucasian female with a past medical history of chronic diastolic CHF, chronic kidney disease stage IV, insulin-dependent diabetes mellitus type 2, coronary artery disease, hypertension, history of diverticular bleed with chronic iron deficiency anemia presented with complains of fatigue, decreased oral intake and nausea vomiting for 2-3 days.  Denied any diarrhea.  Patient was found to have acute renal failure with hyperkalemia.     PT Comments    Pt making steady progress and tolerated short distance gait training in her room this session with min guard and use of RW. Pt limited secondary to fatigue and general weakness. Pt would continue to benefit from skilled physical therapy services at this time while admitted and after d/c to address the below listed limitations in order to improve overall safety and independence with functional mobility.    Follow Up Recommendations  SNF;Supervision/Assistance - 24 hour     Equipment Recommendations  None recommended by PT    Recommendations for Other Services       Precautions / Restrictions Precautions Precautions: Fall Restrictions Weight Bearing Restrictions: No    Mobility  Bed Mobility               General bed mobility comments: pt OOB in recliner chair upon arrival  Transfers Overall transfer level: Needs assistance Equipment used: Rolling walker (2 wheeled) Transfers: Sit to/from Stand Sit to Stand: Min assist         General transfer comment: increased time and effort, assist to power into standing and with stability  Ambulation/Gait Ambulation/Gait assistance: Min guard Ambulation Distance (Feet): 15 Feet Assistive device: Rolling walker (2 wheeled) Gait Pattern/deviations: Step-to pattern;Decreased stride length;Shuffle;Trunk flexed Gait  velocity: decreased Gait velocity interpretation: <1.8 ft/sec, indicative of risk for recurrent falls General Gait Details: very slow, cautious gait with RW, close min guard for safety; pt fatiguing very rapidly   Science writer    Modified Rankin (Stroke Patients Only)       Balance Overall balance assessment: Needs assistance Sitting-balance support: Feet supported Sitting balance-Leahy Scale: Fair     Standing balance support: Bilateral upper extremity supported Standing balance-Leahy Scale: Poor                              Cognition Arousal/Alertness: Awake/alert Behavior During Therapy: WFL for tasks assessed/performed Overall Cognitive Status: Within Functional Limits for tasks assessed                                        Exercises      General Comments        Pertinent Vitals/Pain Pain Assessment: No/denies pain    Home Living                      Prior Function            PT Goals (current goals can now be found in the care plan section) Acute Rehab PT Goals PT Goal Formulation: With patient/family Time For Goal Achievement: 04/12/17 Potential to Achieve Goals: Good Progress towards PT goals: Progressing toward goals    Frequency    Min 3X/week  PT Plan Current plan remains appropriate    Co-evaluation              AM-PAC PT "6 Clicks" Daily Activity  Outcome Measure  Difficulty turning over in bed (including adjusting bedclothes, sheets and blankets)?: A Lot Difficulty moving from lying on back to sitting on the side of the bed? : Unable Difficulty sitting down on and standing up from a chair with arms (e.g., wheelchair, bedside commode, etc,.)?: Unable Help needed moving to and from a bed to chair (including a wheelchair)?: A Little Help needed walking in hospital room?: A Little Help needed climbing 3-5 steps with a railing? : A Lot 6 Click Score: 12     End of Session Equipment Utilized During Treatment: Gait belt Activity Tolerance: Patient limited by fatigue Patient left: in chair;with call bell/phone within reach Nurse Communication: Mobility status PT Visit Diagnosis: Unsteadiness on feet (R26.81);Other abnormalities of gait and mobility (R26.89);Muscle weakness (generalized) (M62.81)     Time: 0981-1914 PT Time Calculation (min) (ACUTE ONLY): 20 min  Charges:  $Therapeutic Activity: 8-22 mins                    G Codes:       Kelly Ruiz, Kelly Ruiz, [place] 04/03/2017, 3:04 PM

## 2017-04-03 NOTE — Progress Notes (Signed)
Progress Note  Patient Name: Kelly Ruiz Date of Encounter: 04/03/2017  Primary Cardiologist:   Minus Breeding, MD   Subjective   Breathing is improved.  Weak but better.   Inpatient Medications    Scheduled Meds: . brinzolamide  1 drop Right Eye BID  . carvedilol  6.25 mg Oral BID WC  . docusate sodium  100 mg Oral BID  . feeding supplement (ENSURE ENLIVE)  237 mL Oral BID BM  . ferrous sulfate  325 mg Oral Q breakfast  . fluticasone  1 spray Each Nare Daily  . furosemide  40 mg Intravenous BID  . guaiFENesin  600 mg Oral BID  . heparin  5,000 Units Subcutaneous Q8H  . hydrALAZINE  100 mg Oral TID  . insulin aspart  0-9 Units Subcutaneous TID WC  . ipratropium-albuterol  3 mL Nebulization BID  . isosorbide mononitrate  240 mg Oral Daily  . latanoprost  1 drop Right Eye QHS  . pantoprazole  40 mg Oral Daily  . predniSONE  40 mg Oral Q breakfast  . senna  1 tablet Oral BID  . sodium chloride flush  3 mL Intravenous Q12H  . timolol  1 drop Right Eye BID   Continuous Infusions:  PRN Meds: acetaminophen **OR** acetaminophen, butalbital-acetaminophen-caffeine, hydrALAZINE, morphine injection, nitroGLYCERIN, ondansetron **OR** ondansetron (ZOFRAN) IV, sodium chloride   Vital Signs    Vitals:   04/03/17 0523 04/03/17 0633 04/03/17 0755 04/03/17 0900  BP: (!) 189/54 (!) 158/41  (!) 175/55  Pulse: 63 61  67  Resp:    18  Temp:    98 F (36.7 C)  TempSrc:    Oral  SpO2:   98% 98%  Weight:      Height:        Intake/Output Summary (Last 24 hours) at 04/03/2017 1003 Last data filed at 04/03/2017 0925 Gross per 24 hour  Intake 1177 ml  Output 2450 ml  Net -1273 ml   Filed Weights   04/01/17 0208 04/01/17 2058 04/02/17 2035  Weight: 143 lb 11.8 oz (65.2 kg) 143 lb 11.8 oz (65.2 kg) 143 lb 15.4 oz (65.3 kg)    Telemetry    NSR - Personally Reviewed  ECG    NA - Personally Reviewed  Physical Exam   GEN: No  acute distress.  Looks better than a couple  of days ago.  Neck:   Mild JVD Cardiac: RRR, no murmurs, rubs, or gallops.  Respiratory: Diffuse expiratory wheezing much improved. GI: Soft, nontender, non-distended, normal bowel sounds  MS:  No edema; No deformity. Neuro:   Nonfocal  Psych: Oriented and appropriate    Labs    Chemistry Recent Labs  Lab 04/01/17 0538 04/02/17 0523 04/03/17 0517  NA 142 140 136  K 4.2 4.3 4.0  CL 112* 106 103  CO2 20* 23 23  GLUCOSE 106* 119* 125*  BUN 53* 57* 62*  CREATININE 1.74* 1.67* 1.59*  CALCIUM 8.6* 8.9 8.8*  ALBUMIN 2.6* 2.5* 2.5*  GFRNONAA 24* 26* 27*  GFRAA 28* 30* 32*  ANIONGAP 10 11 10      Hematology Recent Labs  Lab 04/01/17 0538 04/02/17 0523 04/03/17 0517  WBC 4.6 5.4 7.7  RBC 2.81* 2.85* 2.86*  HGB 9.4* 9.6* 9.4*  HCT 28.8* 28.7* 28.9*  MCV 102.5* 100.7* 101.0*  MCH 33.5 33.7 32.9  MCHC 32.6 33.4 32.5  RDW 14.2 13.6 13.4  PLT 107* 123* 133*    Cardiac Enzymes Recent Labs  Lab 03/30/17  1042 03/30/17 1924 03/31/17 0048  TROPONINI 0.07* 0.18* 0.19*   No results for input(s): TROPIPOC in the last 168 hours.   BNP Recent Labs  Lab 03/30/17 1042  BNP 1,844.6*     DDimer No results for input(s): DDIMER in the last 168 hours.   Radiology    No results found.  Cardiac Studies   ECHO 03/31/17  Study Conclusions  - Left ventricle: The cavity size was normal. Wall thickness was   increased in a pattern of moderate LVH. Systolic function was   normal. The estimated ejection fraction was in the range of 60%   to 65%. Wall motion was normal; there were no regional wall   motion abnormalities. Features are consistent with a pseudonormal   left ventricular filling pattern, with concomitant abnormal   relaxation and increased filling pressure (grade 2 diastolic   dysfunction). - Mitral valve: Severely calcified annulus. There was moderate   regurgitation. - Left atrium: The atrium was moderately dilated. - Pulmonary arteries: Systolic pressure  was moderately increased.   PA peak pressure: 50 mm Hg (S).   Patient Profile     84 y.o. female with a hx of D-CHF, CKD III, DM, HTN, MV 11/2015 w/ sm defect, no ischemia,  who is being seen today for the evaluation of chest pain at the request of Dr Posey Pronto.   Assessment & Plan    AKI:    Creat continues to improve.  Med changes as below.  I would suggest discontinue the Foley and follow her urine output overnight before discharge to rehab.   DYSPNEA:  Improved.  On oral steroids and bronchodilators.  Continue therapy per primary team.  HTN:   I do not want to increase the beta blocker further and she is on high dose hydral/nitrates and cannot be on ARB/ACE inhibitor.  She is allergic to Norvasc.  She is allergic to clonidine. Hydralazine increased.    BP is still elevated.  I will add Cardura  NSTEMI:    I suspect that she did have unstable angina on Saturday.  Medical management.  Stop telemetry.    ACUTE ON CHRONIC DIASTOLIC DYSFUNCTION:   Net negative 3.9 liters.  Changed to PO diuretic.  She should be watched one more day as we make this change to PO.   ANEMIA:   Hgb improved and remains stable after transfusion.    DNR  For questions or updates, please contact [place] Please consult [email or web address] for contact info under Cardiology/STEMI.   Signed, Minus Breeding, MD  04/03/2017, 10:03 AM

## 2017-04-03 NOTE — Clinical Social Work Note (Signed)
CSW talked with patient and son Kelly Ruiz at the bedside regarding patient's discharge. CSW advised by son that he has completed the admissions paperwork at [facility] for his mother. Son was also aware that his mother would not be discharging today and had contacted Jenny Reichmann, admissions director at facility and informed her. Kelly Ruiz requested and was provided with phone number for medical records department as he wants his mother's medical records sent to her doctors after she has been discharged.   CSW will continue to follow, provide SW intervention services as needed and facilitate discharge to [facility] when medically stable.  Kelly Ruiz, MSW, LCSW Licensed Clinical Social Worker [place] [phone]

## 2017-04-03 NOTE — Progress Notes (Addendum)
Inpatient Diabetes Program Recommendations  AACE/ADA: New Consensus Statement on Inpatient Glycemic Control (2015)  Target Ranges:  Prepandial:   less than 140 mg/dL      Peak postprandial:   less than 180 mg/dL (1-2 hours)      Critically ill patients:  140 - 180 mg/dL   Lab Results  Component Value Date   GLUCAP 103 (H) 04/03/2017   HGBA1C 5.5 03/26/2017  Results for YANIL, DAWE (MRN [id number]) as of 04/03/2017 11:12  Ref. Range 04/02/2017 11:51 04/02/2017 17:12 04/02/2017 20:31 04/03/2017 07:54  Glucose-Capillary Latest Ref Range: 65 - 99 mg/dL 260 (H) 277 (H) 248 (H) 103 (H)    Diabetes history: Type 2 DM Outpatient Diabetes medications: Lantus 2 units QHs Current orders for Inpatient glycemic control: Novolog 0-9 units TID  Inpatient Diabetes Program Recommendations:    Increased post prandials >180mg /dL, most likely due to steroids. If continues to remain elevated in the setting of steroids, consider Novolog 3 units TID (when patient consumes >50%). May need further adjustment if steroids are tapered.  With age and renal status, A1C of 5.5% suggests patient is having lows at home. ADA recommends 7-8% A1C target goal in this population. Would not discharge home on Lantus.  Thanks, Bronson Curb, MSN, RNC-OB Diabetes Coordinator [phone] (8a-5p)

## 2017-04-04 DIAGNOSIS — J80 Acute respiratory distress syndrome: Secondary | ICD-10-CM | POA: Diagnosis not present

## 2017-04-04 DIAGNOSIS — I5030 Unspecified diastolic (congestive) heart failure: Secondary | ICD-10-CM | POA: Diagnosis not present

## 2017-04-04 DIAGNOSIS — I129 Hypertensive chronic kidney disease with stage 1 through stage 4 chronic kidney disease, or unspecified chronic kidney disease: Secondary | ICD-10-CM | POA: Diagnosis not present

## 2017-04-04 DIAGNOSIS — D509 Iron deficiency anemia, unspecified: Secondary | ICD-10-CM | POA: Diagnosis not present

## 2017-04-04 DIAGNOSIS — E871 Hypo-osmolality and hyponatremia: Secondary | ICD-10-CM | POA: Diagnosis not present

## 2017-04-04 DIAGNOSIS — I209 Angina pectoris, unspecified: Secondary | ICD-10-CM | POA: Diagnosis not present

## 2017-04-04 DIAGNOSIS — I509 Heart failure, unspecified: Secondary | ICD-10-CM | POA: Diagnosis not present

## 2017-04-04 DIAGNOSIS — N179 Acute kidney failure, unspecified: Secondary | ICD-10-CM | POA: Diagnosis not present

## 2017-04-04 DIAGNOSIS — N184 Chronic kidney disease, stage 4 (severe): Secondary | ICD-10-CM | POA: Diagnosis not present

## 2017-04-04 DIAGNOSIS — R079 Chest pain, unspecified: Secondary | ICD-10-CM | POA: Diagnosis not present

## 2017-04-04 DIAGNOSIS — D649 Anemia, unspecified: Secondary | ICD-10-CM | POA: Diagnosis not present

## 2017-04-04 DIAGNOSIS — R2681 Unsteadiness on feet: Secondary | ICD-10-CM | POA: Diagnosis not present

## 2017-04-04 DIAGNOSIS — N390 Urinary tract infection, site not specified: Secondary | ICD-10-CM | POA: Diagnosis not present

## 2017-04-04 DIAGNOSIS — M6281 Muscle weakness (generalized): Secondary | ICD-10-CM | POA: Diagnosis not present

## 2017-04-04 DIAGNOSIS — I5040 Unspecified combined systolic (congestive) and diastolic (congestive) heart failure: Secondary | ICD-10-CM | POA: Diagnosis not present

## 2017-04-04 DIAGNOSIS — E1142 Type 2 diabetes mellitus with diabetic polyneuropathy: Secondary | ICD-10-CM | POA: Diagnosis not present

## 2017-04-04 DIAGNOSIS — K529 Noninfective gastroenteritis and colitis, unspecified: Secondary | ICD-10-CM | POA: Diagnosis not present

## 2017-04-04 DIAGNOSIS — R739 Hyperglycemia, unspecified: Secondary | ICD-10-CM | POA: Diagnosis not present

## 2017-04-04 DIAGNOSIS — I2510 Atherosclerotic heart disease of native coronary artery without angina pectoris: Secondary | ICD-10-CM | POA: Diagnosis not present

## 2017-04-04 DIAGNOSIS — I10 Essential (primary) hypertension: Secondary | ICD-10-CM | POA: Diagnosis not present

## 2017-04-04 DIAGNOSIS — E1140 Type 2 diabetes mellitus with diabetic neuropathy, unspecified: Secondary | ICD-10-CM | POA: Diagnosis not present

## 2017-04-04 DIAGNOSIS — N183 Chronic kidney disease, stage 3 (moderate): Secondary | ICD-10-CM | POA: Diagnosis not present

## 2017-04-04 DIAGNOSIS — G4700 Insomnia, unspecified: Secondary | ICD-10-CM | POA: Diagnosis not present

## 2017-04-04 DIAGNOSIS — I5033 Acute on chronic diastolic (congestive) heart failure: Secondary | ICD-10-CM | POA: Diagnosis not present

## 2017-04-04 LAB — RENAL FUNCTION PANEL
Albumin: 2.6 g/dL — ABNORMAL LOW (ref 3.5–5.0)
Anion gap: 13 (ref 5–15)
BUN: 66 mg/dL — ABNORMAL HIGH (ref 6–20)
CO2: 25 mmol/L (ref 22–32)
Calcium: 8.7 mg/dL — ABNORMAL LOW (ref 8.9–10.3)
Chloride: 99 mmol/L — ABNORMAL LOW (ref 101–111)
Creatinine, Ser: 1.48 mg/dL — ABNORMAL HIGH (ref 0.44–1.00)
GFR, EST AFRICAN AMERICAN: 34 mL/min — AB (ref >60)
GFR, EST NON AFRICAN AMERICAN: 30 mL/min — AB (ref >60)
Glucose, Bld: 130 mg/dL — ABNORMAL HIGH (ref 65–99)
Phosphorus: 2.6 mg/dL (ref 2.5–4.6)
Potassium: 3.6 mmol/L (ref 3.5–5.1)
Sodium: 137 mmol/L (ref 135–145)

## 2017-04-04 LAB — GLUCOSE, CAPILLARY
GLUCOSE-CAPILLARY: 256 mg/dL — AB (ref 65–99)
Glucose-Capillary: 119 mg/dL — ABNORMAL HIGH (ref 65–99)

## 2017-04-04 MED ORDER — IPRATROPIUM-ALBUTEROL 0.5-2.5 (3) MG/3ML IN SOLN: 3.0000 mL | Freq: Four times a day (QID) | RESPIRATORY_TRACT | Status: DC | PRN

## 2017-04-04 MED ORDER — GUAIFENESIN ER 600 MG PO TB12: 600.0000 mg | ORAL_TABLET | Freq: Two times a day (BID) | ORAL | 0 refills | Status: AC

## 2017-04-04 MED ORDER — DOCUSATE SODIUM 100 MG PO CAPS: 100.0000 mg | ORAL_CAPSULE | Freq: Two times a day (BID) | ORAL | 0 refills | Status: AC | PRN

## 2017-04-04 MED ORDER — DOXAZOSIN MESYLATE 2 MG PO TABS: 2.0000 mg | ORAL_TABLET | Freq: Every day | ORAL | 0 refills | Status: AC

## 2017-04-04 MED ORDER — PREDNISONE 10 MG PO TABS: ORAL_TABLET | ORAL | 0 refills | Status: DC

## 2017-04-04 MED ORDER — ENSURE ENLIVE PO LIQD: 237.0000 mL | Freq: Two times a day (BID) | ORAL | 12 refills | Status: AC

## 2017-04-04 MED ORDER — BUTALBITAL-APAP-CAFFEINE 50-325-40 MG PO TABS: 1.0000 | ORAL_TABLET | Freq: Four times a day (QID) | ORAL | 0 refills | Status: DC | PRN

## 2017-04-04 MED ORDER — CARVEDILOL 6.25 MG PO TABS: 6.2500 mg | ORAL_TABLET | Freq: Two times a day (BID) | ORAL | 0 refills | Status: DC

## 2017-04-04 MED ORDER — FUROSEMIDE 20 MG PO TABS: ORAL_TABLET | ORAL | 0 refills | Status: DC

## 2017-04-04 MED ORDER — PREDNISONE 20 MG PO TABS
20.0000 mg | ORAL_TABLET | Freq: Every day | ORAL | Status: DC
  Administered 2017-04-04: 20 mg via ORAL
  Filled 2017-04-04: qty 1

## 2017-04-04 MED ORDER — HYDRALAZINE HCL 100 MG PO TABS: 100.0000 mg | ORAL_TABLET | Freq: Three times a day (TID) | ORAL | 0 refills | Status: AC

## 2017-04-04 NOTE — Progress Notes (Signed)
Physical Therapy Treatment Patient Details Name: Kelly Ruiz MRN: [id number] DOB: Dec 12, 1923 Today's Date: 04/04/2017    History of Present Illness Pt is a 84 year old Caucasian female with a past medical history of chronic diastolic CHF, chronic kidney disease stage IV, insulin-dependent diabetes mellitus type 2, coronary artery disease, hypertension, history of diverticular bleed with chronic iron deficiency anemia presented with complains of fatigue, decreased oral intake and nausea vomiting for 2-3 days.  Denied any diarrhea.  Patient was found to have acute renal failure with hyperkalemia.     PT Comments    Pt making steady progress with functional mobility. She was able to ambulate a bit further this session and reported that she felt "stronger". Pt would continue to benefit from skilled physical therapy services at this time while admitted and after d/c to address the below listed limitations in order to improve overall safety and independence with functional mobility.    Follow Up Recommendations  SNF;Supervision/Assistance - 24 hour     Equipment Recommendations  None recommended by PT    Recommendations for Other Services       Precautions / Restrictions Precautions Precautions: Fall Restrictions Weight Bearing Restrictions: No    Mobility  Bed Mobility Overal bed mobility: Needs Assistance Bed Mobility: Supine to Sit;Sit to Supine     Supine to sit: Supervision Sit to supine: Supervision   General bed mobility comments: increased time, supervision for safety  Transfers Overall transfer level: Needs assistance Equipment used: Rolling walker (2 wheeled) Transfers: Sit to/from Stand Sit to Stand: Min assist         General transfer comment: increased time and effort, assist to power into standing and with stability  Ambulation/Gait Ambulation/Gait assistance: Min guard Ambulation Distance (Feet): 25 Feet Assistive device: Rolling walker (2  wheeled) Gait Pattern/deviations: Step-to pattern;Decreased stride length;Shuffle;Trunk flexed Gait velocity: decreased Gait velocity interpretation: <1.8 ft/sec, indicative of risk for recurrent falls General Gait Details: very slow, cautious gait with RW, close min guard for safety, no overt LOB or need for physical assistance   Stairs            Wheelchair Mobility    Modified Rankin (Stroke Patients Only)       Balance Overall balance assessment: Needs assistance Sitting-balance support: Feet supported Sitting balance-Leahy Scale: Fair     Standing balance support: Bilateral upper extremity supported Standing balance-Leahy Scale: Poor                              Cognition Arousal/Alertness: Awake/alert Behavior During Therapy: WFL for tasks assessed/performed Overall Cognitive Status: Within Functional Limits for tasks assessed                                        Exercises      General Comments        Pertinent Vitals/Pain Pain Assessment: No/denies pain    Home Living                      Prior Function            PT Goals (current goals can now be found in the care plan section) Acute Rehab PT Goals PT Goal Formulation: With patient/family Time For Goal Achievement: 04/12/17 Potential to Achieve Goals: Good Progress towards PT goals: Progressing toward goals    Frequency  Min 3X/week      PT Plan Current plan remains appropriate    Co-evaluation              AM-PAC PT "6 Clicks" Daily Activity  Outcome Measure  Difficulty turning over in bed (including adjusting bedclothes, sheets and blankets)?: None Difficulty moving from lying on back to sitting on the side of the bed? : None Difficulty sitting down on and standing up from a chair with arms (e.g., wheelchair, bedside commode, etc,.)?: Unable Help needed moving to and from a bed to chair (including a wheelchair)?: A Little Help  needed walking in hospital room?: A Little Help needed climbing 3-5 steps with a railing? : A Lot 6 Click Score: 17    End of Session Equipment Utilized During Treatment: Gait belt Activity Tolerance: Patient limited by fatigue Patient left: in bed;with call bell/phone within reach Nurse Communication: Mobility status PT Visit Diagnosis: Unsteadiness on feet (R26.81);Other abnormalities of gait and mobility (R26.89);Muscle weakness (generalized) (M62.81)     Time: 0175-1025 PT Time Calculation (min) (ACUTE ONLY): 18 min  Charges:  $Gait Training: 8-22 mins                    G Codes:       Bud, [place], [place] 04/04/2017, 2:27 PM

## 2017-04-04 NOTE — Clinical Social Work Placement (Signed)
   CLINICAL SOCIAL WORK PLACEMENT  NOTE 04/04/17 - DISCHARGED TO [facility], [place], [place].  Date:  04/04/2017  Patient Details  Name: Kelly Ruiz MRN: [id number] Date of Birth: 1923-10-07  Clinical Social Work is seeking post-discharge placement for this patient at the [place] level of care (*CSW will initial, date and re-position this form in  chart as items are completed):  Yes   Patient/family provided with [facility] Work Department's list of facilities offering this level of care within the geographic area requested by the patient (or if unable, by the patient's family).  Yes   Patient/family informed of their freedom to choose among providers that offer the needed level of care, that participate in Medicare, Medicaid or managed care program needed by the patient, have an available bed and are willing to accept the patient.  Yes   Patient/family informed of [facility]'s ownership interest in [facility] and [facility], as well as of the fact that they are under no obligation to receive care at these facilities.  PASRR submitted to EDS on       PASRR number received on       Existing PASRR number confirmed on 04/01/17     FL2 transmitted to all facilities in geographic area requested by pt/family on 04/01/17     FL2 transmitted to all facilities within larger geographic area on       Patient informed that his/her managed care company has contracts with or will negotiate with certain facilities, including the following:        Yes   Patient/family informed of bed offers received.  Patient/family chooses bed at  [facility], [place], [place]     Physician recommends and patient chooses bed at      Patient to be transferred to  [facility] on  04/04/16.  Patient to be transferred to facility by  ambulance     Patient family notified on  04/04/16 of transfer.  Name of family member notified:   [name], [name] ([phone])     PHYSICIAN       Additional Comment:    _______________________________________________ Sable Feil, LCSW 04/04/2017, 2:19 PM

## 2017-04-04 NOTE — Progress Notes (Signed)
Progress Note  Patient Name: Kelly Ruiz Date of Encounter: 04/04/2017  Primary Cardiologist:   Minus Breeding, MD   Subjective   She was nauseated and continues to have headaches.  No SOB.  No chest pain.   Inpatient Medications    Scheduled Meds: . brinzolamide  1 drop Right Eye BID  . carvedilol  6.25 mg Oral BID WC  . docusate sodium  100 mg Oral BID  . doxazosin  2 mg Oral QHS  . feeding supplement (ENSURE ENLIVE)  237 mL Oral BID BM  . ferrous sulfate  325 mg Oral Q breakfast  . fluticasone  1 spray Each Nare Daily  . furosemide  40 mg Oral BID  . guaiFENesin  600 mg Oral BID  . heparin  5,000 Units Subcutaneous Q8H  . hydrALAZINE  100 mg Oral TID  . insulin aspart  0-9 Units Subcutaneous TID WC  . ipratropium-albuterol  3 mL Nebulization BID  . isosorbide mononitrate  240 mg Oral Daily  . latanoprost  1 drop Right Eye QHS  . pantoprazole  40 mg Oral Daily  . predniSONE  20 mg Oral Q breakfast  . senna  1 tablet Oral BID  . sodium chloride flush  3 mL Intravenous Q12H  . timolol  1 drop Right Eye BID   Continuous Infusions:  PRN Meds: acetaminophen **OR** acetaminophen, butalbital-acetaminophen-caffeine, hydrALAZINE, morphine injection, nitroGLYCERIN, ondansetron **OR** ondansetron (ZOFRAN) IV, sodium chloride   Vital Signs    Vitals:   04/03/17 1700 04/03/17 2017 04/03/17 2127 04/04/17 0540  BP: (!) 160/65  (!) 186/61 (!) 158/73  Pulse: 69  72 80  Resp: 18  20 19   Temp: 98.8 F (37.1 C)  98.7 F (37.1 C) 98.5 F (36.9 C)  TempSrc: Oral  Oral Oral  SpO2: 97% 96% 98% 98%  Weight:   143 lb 11.8 oz (65.2 kg)   Height:        Intake/Output Summary (Last 24 hours) at 04/04/2017 0741 Last data filed at 04/04/2017 0600 Gross per 24 hour  Intake 1140 ml  Output 2950 ml  Net -1810 ml   Filed Weights   04/01/17 2058 04/02/17 2035 04/03/17 2127  Weight: 143 lb 11.8 oz (65.2 kg) 143 lb 15.4 oz (65.3 kg) 143 lb 11.8 oz (65.2 kg)    Telemetry    NA  - Personally Reviewed  ECG    NA - Personally Reviewed  Physical Exam   GEN: No  acute distress.   Neck: No  JVD Cardiac: RRR, no murmurs, rubs, or gallops.  Respiratory: Improved breath sounds with rare wheezes GI: Soft, nontender, non-distended, normal bowel sounds  MS:  no edema; No deformity. Neuro:   Nonfocal  Psych: Oriented and appropriate     Labs    Chemistry Recent Labs  Lab 04/01/17 0538 04/02/17 0523 04/03/17 0517  NA 142 140 136  K 4.2 4.3 4.0  CL 112* 106 103  CO2 20* 23 23  GLUCOSE 106* 119* 125*  BUN 53* 57* 62*  CREATININE 1.74* 1.67* 1.59*  CALCIUM 8.6* 8.9 8.8*  ALBUMIN 2.6* 2.5* 2.5*  GFRNONAA 24* 26* 27*  GFRAA 28* 30* 32*  ANIONGAP 10 11 10      Hematology Recent Labs  Lab 04/01/17 0538 04/02/17 0523 04/03/17 0517  WBC 4.6 5.4 7.7  RBC 2.81* 2.85* 2.86*  HGB 9.4* 9.6* 9.4*  HCT 28.8* 28.7* 28.9*  MCV 102.5* 100.7* 101.0*  MCH 33.5 33.7 32.9  MCHC 32.6 33.4 32.5  RDW 14.2 13.6 13.4  PLT 107* 123* 133*    Cardiac Enzymes Recent Labs  Lab 03/30/17 1042 03/30/17 1924 03/31/17 0048  TROPONINI 0.07* 0.18* 0.19*   No results for input(s): TROPIPOC in the last 168 hours.   BNP Recent Labs  Lab 03/30/17 1042  BNP 1,844.6*     DDimer No results for input(s): DDIMER in the last 168 hours.   Radiology    No results found.  Cardiac Studies   ECHO 03/31/17  Study Conclusions  - Left ventricle: The cavity size was normal. Wall thickness was   increased in a pattern of moderate LVH. Systolic function was   normal. The estimated ejection fraction was in the range of 60%   to 65%. Wall motion was normal; there were no regional wall   motion abnormalities. Features are consistent with a pseudonormal   left ventricular filling pattern, with concomitant abnormal   relaxation and increased filling pressure (grade 2 diastolic   dysfunction). - Mitral valve: Severely calcified annulus. There was moderate   regurgitation. -  Left atrium: The atrium was moderately dilated. - Pulmonary arteries: Systolic pressure was moderately increased.   PA peak pressure: 50 mm Hg (S).   Patient Profile     84 y.o. female with a hx of D-CHF, CKD III, DM, HTN, MV 11/2015 w/ sm defect, no ischemia,  who is being seen today for the evaluation of chest pain at the request of Dr Posey Pronto.   Assessment & Plan    AKI:    Creat continues to improve.  The current dose of Lasix seems to be working.  However, I would send her home with 40 mg qam and 20 q pm with an extra 20 mg for SOB, weight gain or edema.  She will need close followup of her labs by Ronita Hipps, MD at discharge.   DYSPNEA:  Improved.  Continue current therapy with plans for steroids to be outlined by her primary team.   HTN:   BP is still running high.  I added Cardura yesterday.  Continue current meds  NSTEMI:    Medical management.   ACUTE ON CHRONIC DIASTOLIC DYSFUNCTION:   Down another 1.8 liters.  5.7 liters down over the last several days.  Now on PO diuretic.  ANEMIA:   Hgb improved and remains stable after transfusion.    DNR  For questions or updates, please contact [place] Please consult [email or web address] for contact info under Cardiology/STEMI.   Signed, Minus Breeding, MD  04/04/2017, 7:41 AM

## 2017-04-04 NOTE — Care Management Important Message (Signed)
Important Message  Patient Details  Name: Kelly Ruiz MRN: [id number] Date of Birth: 01/31/1923   Medicare Important Message Given:  Yes    Younique [place]ad [place] 04/04/2017, 12:21 PM

## 2017-04-04 NOTE — Progress Notes (Addendum)
Report given to Programme researcher, broadcasting/film/video at [facility]. Patient going to room 205. [facility] has been called.  Patient left unit in stable condition via [facility] with all belongings in tow.

## 2017-04-04 NOTE — Progress Notes (Signed)
Inpatient Diabetes Program Recommendations  AACE/ADA: New Consensus Statement on Inpatient Glycemic Control (2015)  Target Ranges:  Prepandial:   less than 140 mg/dL      Peak postprandial:   less than 180 mg/dL (1-2 hours)      Critically ill patients:  140 - 180 mg/dL   Lab Results  Component Value Date   GLUCAP 256 (H) 04/04/2017   HGBA1C 5.5 03/26/2017    Review of Glycemic Control Results for Kelly Ruiz, Kelly Ruiz (MRN [id number]) as of 04/04/2017 12:07  Ref. Range 04/03/2017 17:51 04/03/2017 21:25 04/04/2017 07:27 04/04/2017 11:32  Glucose-Capillary Latest Ref Range: 65 - 99 mg/dL 367 (H) 285 (H) 119 (H) 256 (H)    Diabetes history:Type 2 DM Outpatient Diabetes medications:Lantus 2 units QHs Current orders for Inpatient glycemic control:Novolog 0-9 units TID  Inpatient Diabetes Program Recommendations:  Noted prednisone decreased to 20 mg QAM and will most likely be discharging tomorrow.  Increased post prandials >180mg /dL, most likely due to steroids. If continues to remain elevated in the setting of steroids, consider Novolog 3 units TID (when patient consumes >50%). May need further adjustment if steroids are tapered.  With age and renal status, A1C of 5.5% suggests patient is having lows at home. ADA recommends 7-8% A1C target goal in this population. Would not discharge home on Lantus. Text page sent.  Thanks, Bronson Curb, MSN, RNC-OB Diabetes Coordinator [phone] (8a-5p)

## 2017-04-04 NOTE — Discharge Summary (Signed)
[facility] Discharge Summary   Patient: Kelly Ruiz DGL:[id number]   PCP: Ronita Hipps, MD DOB: Oct 24, 1923   Date of admission: 03/26/2017   Date of discharge:  04/04/2017    Discharge Diagnoses:  Principal Problem:   AKI (acute kidney injury) ([place]) Active Problems:   Essential hypertension   Chest pain with moderate risk of acute coronary syndrome   Insulin dependent diabetes mellitus with renal manifestation (HCC)   Acute on chronic diastolic HF (heart failure) ([place])   Admitted From: home Disposition:  SNF  Recommendations for Outpatient Follow-up:  1. Please follow-up with PCP in 1 week and get a BMP checked up on 1 week.  Also check up on CBC in 1-2 weeks.   Contact information for follow-up providers    Ronita Hipps, MD. Call.   Specialty:  Family Medicine Why:  repeat BMP in 1 week.  Contact information: [place] [phone]        Erlene Quan, PA-C Follow up.   Specialties:  Cardiology, Radiology Why:  [facility] location - 04/16/17 at 11:30am. Arrive 15 minutes prior to appointment to check in. Lurena Joiner is one of the PAs that works very closely with Dr. Percival Spanish. Contact information: [address] [place] [address] [phone]            Contact information for after-discharge care    [facility] .   Service:  Skilled Nursing Contact information: [place] [phone]                 Diet recommendation: Cardiac diet  Activity: The patient is advised to gradually reintroduce usual activities.  Discharge Condition: good  Code Status: DNR/DNI  History of present illness: As per the H and P dictated on admission, " Kelly Ruiz is a 84 y.o. female with medical history significant for heart failure with preserved ejection fraction, CKD stage IV, insulin-dependent diabetes mellitus type 2, CAD,  hypertension, history of diverticular bleed with resultant chronic iron deficiency anemia and recurrent right hip abscess status post 2 recent courses of antibiotics who presents to the ED from her home where she lives with her husband and full-time caretakers Monday through Friday with approximately 1 week of fatigue in 2-3 days of nausea, decreased oral intake and yellow brown watery emesis approximately 3 times within that timeframe.  Patient denies fever, myalgias, cough, shortness of breath, chest pain, abdominal pain, diarrhea, dysuria.  She reports mildly darkened urine from her baseline but has not noticed any decrease in her urine output.  She had episode of nausea and vomiting in the ED, but reports that her symptoms were improved after Zofran administration.  No sick contacts at home.  Husband does not have similar symptoms.  She cannot think of any dietary indiscretions.  She has been taking her medications as prescribed."  Hospital Course:  Summary of her active problems in the hospital is as following. Acute renal failure on chronic kidney disease stage IV non-anion gap metabolic acidosis hyperkalemia Patient's creatinine was 1.63 and BUN was 53 and November 2018.  On admission it was 3.9 and BUN was 109. She did have some nausea vomiting over the last few days. Etiology was prerenal on admission, later on after hydration etiology turning to cardiorenal hemodynamics. was started on IV fluids with some improvement in her creatinine. Potassium did improve with Kayexalate, calcium gluconate and dextrose/insulin.  Noncontrast CT scan did not show any structural issues in her GU tract. Now creatinine back to her baseline. Continue to hold ARB on discharge.  Nausea and vomiting likely from constipation Chronic diarrhea, now constipation Etiology unclear.  CT scan of the abdomen and pelvis did not show any obvious abnormalities.   Resolved patient's oral intake was adequate. Patient is on  Bentyl and Lomotil at home.  Appears to have significant constipation associated with nausea and vomiting. At present discontinue Lomotil, continue Bentyl for abdominal cramps, PRN stool softener.  History of coronary artery disease,  Acute on chronic diastolic CHF, hypertension Unstable angina with chest pain. Substernal chest pain,?  Unstable angina. Symptoms resolved after nitroglycerin as well as S/P 1 PRBC transfusion.  PRN nitroglycerin. Troponin mildly elevated. EKG has some ST depression. Not a good candidate for any intervention for now. Cardiology was consulted, appreciate their assistance in management. Echocardiogram shows preserved EF without any wall motion abnormality. Medication adjusted, Coreg dose was reduced to 6.25 mg twice daily, losartan on hold.  Hydralazine dose increased from 50 mg 200 mg 3 times daily, continue imdur to 40 mg daily. Cardura added as well.  Insulin-dependent diabetes mellitus with polyneuropathy Hemoglobin A1c 5.5. Discontinue Lantus on discharge. Mild hyperglycemia in the hospital due to steroids.  Tapering of the steroids rapidly and therefore we will not continue any insulin on discharge.  Chronic microcytic anemia Hemoglobin close to baseline.  No evidence of overt bleeding.  Anticipate some dilutional drop.  Enterococcus UTI. Present on admission. Treat with fosfomycin x1.  Neuropathy. Acute encephalopathy. In response to gabapentin patient received last night. Continue to avoid psychotropic medications in this patient.  Acute on chronic diastolic CHF. Patient was on oral Bumex before admission, this was on hold due to acute kidney injury with dehydration. Creatinine has improved and now cardiology recommended IV diuresis. Switching to p.o. Patient was on 40 mg twice daily Lasix in the hospital on discharge per cardiology recommendation patient will be on 30 mg in the morning 20 mg in the afternoon as well as 20 mg extra for  weight gain shortness of breath as well as pedal edema.  Bilateral wheezing  acute viral bronchitis.. Likely in the setting of CHF. Possibility of acute bronchitis cannot be ruled out but more likely viral. Monitors supportive treatment with DuoNeb's. Oral prednisone.  Headache. As needed Tylenol.  As needed Fioricet.  All other chronic medical condition were stable during the hospitalization.  Patient was seen by physical therapy, who recommended SNF, which was arranged by Education officer, museum and case Freight forwarder. On the day of the discharge the patient's vitals were stable , and no other acute medical condition were reported by patient. the patient was felt safe to be discharge at SNF with therapy .  Procedures and Results:  Echocardiogram    Consultations:  Cardiology   DISCHARGE MEDICATION: Allergies as of 04/04/2017      Reactions   Actos [pioglitazone Hydrochloride]    Felodipine Er    Hctz [hydrochlorothiazide]    Lisinopril    Plendil [felodipine]    Amlodipine Besylate Other (See Comments), Hives   Brimonidine Tartrate Other (See Comments)   Eye redness and itchiness, blurred vision   Clonidine Derivatives Rash   Combigan [brimonidine Tartrate-timolol] Other (See Comments)   Eye redness and itchiness, blurred vision   Dorzolamide Other (See Comments)   Increased eye pressure, blurred vision   Monopril [fosinopril Sodium] Cough   Norvasc [amlodipine Besylate] Hives   Penicillins Rash  Has patient had a PCN reaction causing immediate rash, facial/tongue/throat swelling, SOB or lightheadedness with hypotension: unknown Has patient had a PCN reaction causing severe rash involving mucus membranes or skin necrosis: unknown Has patient had a PCN reaction that required hospitalization: unknown Has patient had a PCN reaction occurring within the last 10 years: no If all of the above answers are "NO", then may proceed with Cephalosporin use.      Medication List    STOP  taking these medications   bumetanide 1 MG tablet Commonly known as:  BUMEX   diphenoxylate-atropine 2.5-0.025 MG tablet Commonly known as:  LOMOTIL   LANTUS SOLOSTAR 100 UNIT/ML Solostar Pen Generic drug:  Insulin Glargine   losartan 50 MG tablet Commonly known as:  COZAAR   mupirocin ointment 2 % Commonly known as:  BACTROBAN     TAKE these medications   acetaminophen 500 MG tablet Commonly known as:  TYLENOL Take 1,000 mg by mouth 2 (two) times daily.   brinzolamide 1 % ophthalmic suspension Commonly known as:  AZOPT Place 1 drop into the right eye 2 (two) times daily.   butalbital-acetaminophen-caffeine 50-325-40 MG tablet Commonly known as:  FIORICET, ESGIC Take 1 tablet by mouth every 6 (six) hours as needed for headache.   carvedilol 6.25 MG tablet Commonly known as:  COREG Take 1 tablet (6.25 mg total) by mouth 2 (two) times daily with a meal. What changed:    medication strength  how much to take  Another medication with the same name was removed. Continue taking this medication, and follow the directions you see here.   cyanocobalamin 1000 MCG tablet Take 1,000 mcg by mouth daily.   dicyclomine 10 MG capsule Commonly known as:  BENTYL Take 10 mg by mouth 3 (three) times daily before meals.   docusate sodium 100 MG capsule Commonly known as:  COLACE Take 1 capsule (100 mg total) by mouth 2 (two) times daily as needed for mild constipation.   doxazosin 2 MG tablet Commonly known as:  CARDURA Take 1 tablet (2 mg total) by mouth at bedtime.   EYE VITAMINS PO Take 1 tablet by mouth 2 (two) times daily.   feeding supplement (ENSURE ENLIVE) Liqd Take 237 mLs by mouth 2 (two) times daily between meals.   ferrous sulfate 325 (65 FE) MG tablet Take 325 mg by mouth 2 (two) times daily with a meal.   fluticasone 50 MCG/ACT nasal spray Commonly known as:  FLONASE Place 2 sprays into both nostrils 2 (two) times daily.   furosemide 20 MG  tablet Commonly known as:  LASIX Take 40 mg in AM, 20 mg QPM, take extra 20 mg for weight gain on 2 lbs, shortnes of breath, or edema   guaiFENesin 600 MG 12 hr tablet Commonly known as:  MUCINEX Take 1 tablet (600 mg total) by mouth 2 (two) times daily.   hydrALAZINE 100 MG tablet Commonly known as:  APRESOLINE Take 1 tablet (100 mg total) by mouth 3 (three) times daily. What changed:    medication strength  how much to take   isosorbide mononitrate 120 MG 24 hr tablet Commonly known as:  IMDUR Take 2 tablets (240 mg total) by mouth daily.   latanoprost 0.005 % ophthalmic solution Commonly known as:  XALATAN Place 1 drop into the right eye at bedtime.   nitroGLYCERIN 0.4 MG SL tablet Commonly known as:  NITROSTAT Place 1 tablet (0.4 mg total) under the tongue every 5 (five) minutes as needed  for chest pain.   omeprazole 20 MG capsule Commonly known as:  PRILOSEC Take 20 mg by mouth daily.   predniSONE 10 MG tablet Commonly known as:  DELTASONE Take 20mg  daily for 3days,Take 10mg  daily for 3days, then stop.   SALONPAS ARTHRITIS PAIN RELIEF EX Apply 1 patch topically every 12 (twelve) hours.   timolol 0.5 % ophthalmic solution Commonly known as:  TIMOPTIC Place 1 drop into the right eye 2 (two) times daily.   traZODone 50 MG tablet Commonly known as:  DESYREL Take 75 mg by mouth at bedtime.   vitamin C 500 MG tablet Commonly known as:  ASCORBIC ACID Take 500 mg by mouth 2 (two) times daily.      Allergies  Allergen Reactions  . Actos [Pioglitazone Hydrochloride]   . Felodipine Er   . Hctz [Hydrochlorothiazide]   . Lisinopril   . Plendil [Felodipine]   . Amlodipine Besylate Other (See Comments) and Hives  . Brimonidine Tartrate Other (See Comments)    Eye redness and itchiness, blurred vision  . Clonidine Derivatives Rash  . Combigan [Brimonidine Tartrate-Timolol] Other (See Comments)    Eye redness and itchiness, blurred vision  . Dorzolamide Other  (See Comments)    Increased eye pressure, blurred vision  . Monopril [Fosinopril Sodium] Cough  . Norvasc [Amlodipine Besylate] Hives  . Penicillins Rash    Has patient had a PCN reaction causing immediate rash, facial/tongue/throat swelling, SOB or lightheadedness with hypotension: unknown Has patient had a PCN reaction causing severe rash involving mucus membranes or skin necrosis: unknown Has patient had a PCN reaction that required hospitalization: unknown Has patient had a PCN reaction occurring within the last 10 years: no If all of the above answers are "NO", then may proceed with Cephalosporin use.    Discharge Instructions    Diet - low sodium heart healthy   Complete by:  As directed    Discharge instructions   Complete by:  As directed    It is important that you read following instructions as well as go over your medication list with RN to help you understand your care after this hospitalization.  Discharge Instructions:  Please follow-up with PCP in one week  Please request your primary care physician to go over all Hospital Tests and Procedure/Radiological results at the follow up,  Please get all Hospital records sent to your PCP by signing hospital release before you go home.   Do not take more than prescribed Pain, Sleep and Anxiety Medications. You were cared for by a hospitalist during your hospital stay. If you have any questions about your discharge medications or the care you received while you were in the hospital after you are discharged, you can call the unit and ask to speak with the hospitalist on call if the hospitalist that took care of you is not available.  Once you are discharged, your primary care physician will handle any further medical issues. Please note that NO REFILLS for any discharge medications will be authorized once you are discharged, as it is imperative that you return to your primary care physician (or establish a relationship with a primary  care physician if you do not have one) for your aftercare needs so that they can reassess your need for medications and monitor your lab values. You Must read complete instructions/literature along with all the possible adverse reactions/side effects for all the Medicines you take and that have been prescribed to you. Take any new Medicines after you have  completely understood and accept all the possible adverse reactions/side effects. Wear Seat belts while driving.   Increase activity slowly   Complete by:  As directed      Discharge Exam: Filed Weights   04/01/17 2058 04/02/17 2035 04/03/17 2127  Weight: 65.2 kg (143 lb 11.8 oz) 65.3 kg (143 lb 15.4 oz) 65.2 kg (143 lb 11.8 oz)   Vitals:   04/04/17 0743 04/04/17 0945  BP:  (!) 172/44  Pulse:  67  Resp:  18  Temp:  98.2 F (36.8 C)  SpO2: 97% 97%   General: Appear in no distress, no Rash; Oral Mucosa moist. Cardiovascular: S1 and S2 Present, no Murmur, no JVD Respiratory: Bilateral Air entry present and Clear to Auscultation, no Crackles, no wheezes Abdomen: Bowel Sound present, Soft and no tenderness Extremities: no Pedal edema, no calf tenderness Neurology: Grossly no focal neuro deficit.  The results of significant diagnostics from this hospitalization (including imaging, microbiology, ancillary and laboratory) are listed below for reference.    Significant Diagnostic Studies: Ct Abdomen Pelvis Wo Contrast  Result Date: 03/26/2017 CLINICAL DATA:  Mid abdominal pain and vomiting for 3 days. History of diabetes, hypertension, hysterectomy, cholecystectomy and appendectomy. EXAM: CT ABDOMEN AND PELVIS WITHOUT CONTRAST TECHNIQUE: Multidetector CT imaging of the abdomen and pelvis was performed following the standard protocol without IV contrast. COMPARISON:  None. FINDINGS: LOWER CHEST: Multiple sub solid bibasilar pulmonary nodules measuring to 4 mm. Imaged heart is upper limits of normal in size. Mild coronary artery  calcifications, mitral annular calcifications. No pericardial effusion. HEPATOBILIARY: Status post cholecystectomy. Negative noncontrast CT liver. PANCREAS: Atrophic, nonacute. SPLEEN: Normal. Inter splenic calcified granuloma versus vascular calcification. ADRENALS/URINARY TRACT: Kidneys are orthotopic, moderate symmetric atrophy. No nephrolithiasis, hydronephrosis; limited assessment for renal masses on this nonenhanced examination. The unopacified ureters are normal in course and caliber. Urinary bladder is well distended and unremarkable. Normal adrenal glands. STOMACH/BOWEL: The stomach, small and large bowel are normal in course and caliber without inflammatory changes, sensitivity decreased by lack of enteric contrast. 8 mm ovoid calcification dependent in gastric antrum. Moderate volume retained large bowel stool. Mild colonic diverticulosis. Moderate air and fluid containing duodenal diverticulum. VASCULAR/LYMPHATIC: Aortoiliac vessels are normal in course and caliber. Moderate calcific atherosclerosis. No lymphadenopathy by CT size criteria. REPRODUCTIVE: Status post hysterectomy. OTHER: No intraperitoneal free fluid or free air. MUSCULOSKELETAL: Non-acute. Osteopenia. Levoscoliosis and severe degenerative change of lumbar spine. Thickened T12 trabecular with small associated lucency seen with Paget's disease or hemangioma. Focal lucency LEFT humeral head equivocal for avascular necrosis without collapse. Anterior abdominal wall ligamentous laxity and scarring. IMPRESSION: 1. Moderate volume retained large bowel stool without bowel obstruction nor acute intra-abdominal/pelvic process. 2. Moderately atrophic kidneys without nephrolithiasis or obstructive uropathy. 3. **An incidental finding of potential clinical significance has been found. Multiple bibasilar sub solid pulmonary nodules measuring less than 6 mm. Recommend follow-up CT chest at 3-6 months. This recommendation follows the consensus statement:  Guidelines for Management of Small Pulmonary Nodules Detected on CT Images: From the Fleischner Society 2017; Radiology 2017; 284:228-243.** Aortic Atherosclerosis (ICD10-I70.0). Electronically Signed   By: Elon Alas M.D.   On: 03/26/2017 16:19   Dg Chest Port 1 View  Result Date: 03/30/2017 CLINICAL DATA:  Shortness of breath. EXAM: PORTABLE CHEST 1 VIEW COMPARISON:  Chest x-ray from yesterday. FINDINGS: Stable cardiomegaly, pulmonary vascular congestion, and interstitial edema. Relatively unchanged small left greater than right pleural effusions with patchy bibasilar opacities. No pneumothorax. No acute osseous abnormality. IMPRESSION: Stable congestive  heart failure. Electronically Signed   By: Titus Dubin M.D.   On: 03/30/2017 12:04   Dg Chest Port 1 View  Result Date: 03/29/2017 CLINICAL DATA:  Shortness of breath, productive cough. EXAM: PORTABLE CHEST 1 VIEW COMPARISON:  None. FINDINGS: Mild cardiomegaly is noted with central pulmonary vascular congestion. Atherosclerosis of thoracic aorta is noted. No pneumothorax is noted. Mild bibasilar densities are noted concerning for edema or possibly atelectasis. Minimal bilateral pleural effusions are noted. Bony thorax is unremarkable. IMPRESSION: Mild cardiomegaly with central pulmonary vascular congestion. Mild bibasilar edema or subsegmental atelectasis is noted. Minimal bilateral pleural effusions. Aortic Atherosclerosis (ICD10-I70.0). Electronically Signed   By: Marijo Conception, M.D.   On: 03/29/2017 14:40    Microbiology: Recent Results (from the past 240 hour(s))  Urine culture     Status: Abnormal   Collection Time: 03/26/17  2:29 PM  Result Value Ref Range Status   Specimen Description URINE, CLEAN CATCH  Final   Special Requests   Final    NONE Performed at [facility], [place] [address] [place], [place] [address]    Culture 50,000 COLONIES/mL ENTEROCOCCUS FAECALIS (A)  Final   Report Status 03/28/2017 FINAL  Final    Organism ID, Bacteria ENTEROCOCCUS FAECALIS (A)  Final      Susceptibility   Enterococcus faecalis - MIC*    AMPICILLIN RESISTANT Resistant     LEVOFLOXACIN >=8 RESISTANT Resistant     NITROFURANTOIN <=16 SENSITIVE Sensitive     VANCOMYCIN 1 SENSITIVE Sensitive     * 50,000 COLONIES/mL ENTEROCOCCUS FAECALIS     Labs: CBC: Recent Labs  Lab 03/30/17 0724 03/31/17 0048 04/01/17 0538 04/02/17 0523 04/03/17 0517  WBC 5.9 5.9 4.6 5.4 7.7  HGB 7.7* 9.3* 9.4* 9.6* 9.4*  HCT 24.7* 28.6* 28.8* 28.7* 28.9*  MCV 102.5* 102.5* 102.5* 100.7* 101.0*  PLT 121* 108* 107* 123* 237*   Basic Metabolic Panel: Recent Labs  Lab 03/29/17 0601  03/31/17 0048 04/01/17 0538 04/02/17 0523 04/03/17 0517 04/04/17 0741  NA 145   < > 142 142 140 136 137  K 3.7   < > 4.1 4.2 4.3 4.0 3.6  CL 118*   < > 116* 112* 106 103 99*  CO2 20*   < > 19* 20* 23 23 25   GLUCOSE 108*   < > 108* 106* 119* 125* 130*  BUN 75*   < > 57* 53* 57* 62* 66*  CREATININE 2.20*   < > 2.03* 1.74* 1.67* 1.59* 1.48*  CALCIUM 8.6*   < > 8.6* 8.6* 8.9 8.8* 8.7*  MG 2.0  --   --   --   --   --   --   PHOS 4.1   < > 4.3 3.9 3.1 2.6 2.6   < > = values in this interval not displayed.   Liver Function Tests: Recent Labs  Lab 03/31/17 0048 04/01/17 0538 04/02/17 0523 04/03/17 0517 04/04/17 0741  ALBUMIN 2.5* 2.6* 2.5* 2.5* 2.6*   No results for input(s): LIPASE, AMYLASE in the last 168 hours. No results for input(s): AMMONIA in the last 168 hours. Cardiac Enzymes: Recent Labs  Lab 03/30/17 1042 03/30/17 1924 03/31/17 0048  CKTOTAL 43  --   --   CKMB 3.0  --   --   TROPONINI 0.07* 0.18* 0.19*   BNP (last 3 results) Recent Labs    03/30/17 1042  BNP 1,844.6*   CBG: Recent Labs  Lab 04/03/17 1145 04/03/17 1751 04/03/17 2125  04/04/17 0727 04/04/17 1132  GLUCAP 254* 367* 285* 119* 256*   Time spent: 35 minutes  Signed:  Berle Mull  [facility]  04/04/2017  , 1:00 PM

## 2017-04-04 NOTE — Progress Notes (Signed)
F/u appt made per Dr. [name] request and placed in F/u section of AVS.

## 2017-04-05 DIAGNOSIS — D649 Anemia, unspecified: Secondary | ICD-10-CM | POA: Diagnosis not present

## 2017-04-05 DIAGNOSIS — E1140 Type 2 diabetes mellitus with diabetic neuropathy, unspecified: Secondary | ICD-10-CM | POA: Diagnosis not present

## 2017-04-05 DIAGNOSIS — I509 Heart failure, unspecified: Secondary | ICD-10-CM | POA: Diagnosis not present

## 2017-04-05 DIAGNOSIS — G4700 Insomnia, unspecified: Secondary | ICD-10-CM | POA: Diagnosis not present

## 2017-04-05 DIAGNOSIS — I10 Essential (primary) hypertension: Secondary | ICD-10-CM | POA: Diagnosis not present

## 2017-04-05 DIAGNOSIS — N183 Chronic kidney disease, stage 3 (moderate): Secondary | ICD-10-CM | POA: Diagnosis not present

## 2017-04-05 DIAGNOSIS — I2510 Atherosclerotic heart disease of native coronary artery without angina pectoris: Secondary | ICD-10-CM | POA: Diagnosis not present

## 2017-04-06 DIAGNOSIS — E1140 Type 2 diabetes mellitus with diabetic neuropathy, unspecified: Secondary | ICD-10-CM | POA: Diagnosis not present

## 2017-04-06 DIAGNOSIS — I509 Heart failure, unspecified: Secondary | ICD-10-CM | POA: Diagnosis not present

## 2017-04-06 DIAGNOSIS — I2510 Atherosclerotic heart disease of native coronary artery without angina pectoris: Secondary | ICD-10-CM | POA: Diagnosis not present

## 2017-04-06 DIAGNOSIS — I10 Essential (primary) hypertension: Secondary | ICD-10-CM | POA: Diagnosis not present

## 2017-04-06 DIAGNOSIS — D649 Anemia, unspecified: Secondary | ICD-10-CM | POA: Diagnosis not present

## 2017-04-06 DIAGNOSIS — R739 Hyperglycemia, unspecified: Secondary | ICD-10-CM | POA: Diagnosis not present

## 2017-04-06 DIAGNOSIS — G4700 Insomnia, unspecified: Secondary | ICD-10-CM | POA: Diagnosis not present

## 2017-04-06 DIAGNOSIS — N183 Chronic kidney disease, stage 3 (moderate): Secondary | ICD-10-CM | POA: Diagnosis not present

## 2017-04-10 DIAGNOSIS — I10 Essential (primary) hypertension: Secondary | ICD-10-CM | POA: Diagnosis not present

## 2017-04-10 DIAGNOSIS — I2510 Atherosclerotic heart disease of native coronary artery without angina pectoris: Secondary | ICD-10-CM | POA: Diagnosis not present

## 2017-04-10 DIAGNOSIS — I509 Heart failure, unspecified: Secondary | ICD-10-CM | POA: Diagnosis not present

## 2017-04-10 DIAGNOSIS — G4700 Insomnia, unspecified: Secondary | ICD-10-CM | POA: Diagnosis not present

## 2017-04-10 DIAGNOSIS — D649 Anemia, unspecified: Secondary | ICD-10-CM | POA: Diagnosis not present

## 2017-04-10 DIAGNOSIS — N183 Chronic kidney disease, stage 3 (moderate): Secondary | ICD-10-CM | POA: Diagnosis not present

## 2017-04-10 DIAGNOSIS — E1140 Type 2 diabetes mellitus with diabetic neuropathy, unspecified: Secondary | ICD-10-CM | POA: Diagnosis not present

## 2017-04-10 DIAGNOSIS — R739 Hyperglycemia, unspecified: Secondary | ICD-10-CM | POA: Diagnosis not present

## 2017-04-16 ENCOUNTER — Ambulatory Visit (INDEPENDENT_AMBULATORY_CARE_PROVIDER_SITE_OTHER): Payer: Medicare Other | Admitting: Cardiology

## 2017-04-16 ENCOUNTER — Encounter: Payer: Self-pay | Admitting: Cardiology

## 2017-04-16 DIAGNOSIS — I2510 Atherosclerotic heart disease of native coronary artery without angina pectoris: Secondary | ICD-10-CM

## 2017-04-16 DIAGNOSIS — I10 Essential (primary) hypertension: Secondary | ICD-10-CM | POA: Diagnosis not present

## 2017-04-16 DIAGNOSIS — N184 Chronic kidney disease, stage 4 (severe): Secondary | ICD-10-CM

## 2017-04-16 DIAGNOSIS — I5033 Acute on chronic diastolic (congestive) heart failure: Secondary | ICD-10-CM | POA: Diagnosis not present

## 2017-04-16 DIAGNOSIS — R079 Chest pain, unspecified: Secondary | ICD-10-CM

## 2017-04-16 NOTE — Assessment & Plan Note (Signed)
Follow up with PCP- now off cozaar

## 2017-04-16 NOTE — Assessment & Plan Note (Signed)
Stable today.

## 2017-04-16 NOTE — Assessment & Plan Note (Signed)
Labile but under good control for her today

## 2017-04-16 NOTE — Patient Instructions (Signed)
Medication Instructions:  Your physician recommends that you continue on your current medications as directed. Please refer to the Current Medication list given to you today.   Labwork: none  Testing/Procedures: None   Follow-Up: Your follow-up appointment is scheduled for Monday, June 3rd at 11 AM. Please arrive 15 minutes before your appointment to allow for registration.  Any Other Special Instructions Will Be Listed Below (If Applicable).     If you need a refill on your cardiac medications before your next appointment, please call your pharmacy.

## 2017-04-16 NOTE — Progress Notes (Signed)
04/16/2017 Kelly Ruiz   [birth year]  [id number]  Primary Physician Kelly Hipps, MD Primary Cardiologist: Dr Kelly Ruiz  HPI:  Pleasant 84 y/o female followed by Dr Kelly Ruiz with a history of chronic diastolic CHF, CRI-4, HTN, CAD based on abnormal Myoview, and anemia (off ASA). Kelly Ruiz was recently admitted to [facility] 03/26/17 with CHF after her diuretics were topped as an OP secondary to her renal insufficiency. She also had chest pain during that admission with a Tropnoin of 0.19 that responded to nitrates. Her medications were adjusted, she was taken off Cozaar and back on Hydralazine 100 mg TID. She was discharge to SNF for rehab and is due to be released in a week. She is in the office today for follow up accompanied by her son. She has been doing well, no further chest pain. No unusual dyspnea, her B/P is controlled. Her son tells me she is no longer seeing her nephrologist in [facility].    Current Outpatient Medications  Medication Sig Dispense Refill  . acetaminophen (TYLENOL) 500 MG tablet Take 1,000 mg by mouth 2 (two) times daily.     . brinzolamide (AZOPT) 1 % ophthalmic suspension Place 1 drop into the right eye 2 (two) times daily.     . butalbital-acetaminophen-caffeine (FIORICET, ESGIC) 50-325-40 MG tablet Take 1 tablet by mouth every 6 (six) hours as needed for headache. 14 tablet 0  . carvedilol (COREG) 6.25 MG tablet Take 1 tablet (6.25 mg total) by mouth 2 (two) times daily with a meal. 60 tablet 0  . Cyanocobalamin (B-12 COMPLIANCE INJECTION IJ) Inject as directed. Inject 1cc intramuscularly in the evening on the 27th and ending on the 27th every month for prophylaxis    . dicyclomine (BENTYL) 10 MG capsule Take 10 mg by mouth 3 (three) times daily before meals.     . docusate sodium (COLACE) 100 MG capsule Take 1 capsule (100 mg total) by mouth 2 (two) times daily as needed for mild constipation. 10 capsule 0  . doxazosin (CARDURA) 2 MG tablet Take 1 tablet (2 mg total)  by mouth at bedtime. 30 tablet 0  . feeding supplement, ENSURE ENLIVE, (ENSURE ENLIVE) LIQD Take 237 mLs by mouth 2 (two) times daily between meals. 237 mL 12  . ferrous sulfate 325 (65 FE) MG tablet Take 325 mg by mouth 2 (two) times daily with a meal.     . fluticasone (FLONASE) 50 MCG/ACT nasal spray Place 2 sprays into both nostrils 2 (two) times daily.    . furosemide (LASIX) 20 MG tablet Take 20 mg by mouth. Take 1 tablet by mouth at bedtime for prophylaxis, take extra 20mg  for weight gain of 2lbs,SOB, or edema    . furosemide (LASIX) 40 MG tablet Take 40 mg by mouth daily.    [name] guaiFENesin (MUCINEX) 600 MG 12 hr tablet Take 1 tablet (600 mg total) by mouth 2 (two) times daily. 30 tablet 0  . hydrALAZINE (APRESOLINE) 100 MG tablet Take 1 tablet (100 mg total) by mouth 3 (three) times daily. 90 tablet 0  . isosorbide mononitrate (IMDUR) 120 MG 24 hr tablet Take 2 tablets (240 mg total) by mouth daily. 180 tablet 3  . latanoprost (XALATAN) 0.005 % ophthalmic solution Place 1 drop into the right eye at bedtime.     . Multiple Vitamins-Minerals (EYE VITAMINS PO) Take 1 tablet by mouth 2 (two) times daily.     . nitroGLYCERIN (NITROSTAT) 0.4 MG SL tablet Place 1 tablet (0.4  mg total) under the tongue every 5 (five) minutes as needed for chest pain. 25 tablet 3  . omeprazole (PRILOSEC) 20 MG capsule Take 20 mg by mouth daily.    . timolol (TIMOPTIC) 0.5 % ophthalmic solution Place 1 drop into the right eye 2 (two) times daily.     . traZODone (DESYREL) 50 MG tablet Take 75 mg by mouth at bedtime.    . vitamin B-12 (CYANOCOBALAMIN) 1000 MCG tablet Take 1,000 mcg by mouth daily.    . vitamin C (ASCORBIC ACID) 500 MG tablet Take 500 mg by mouth 2 (two) times daily.      No current facility-administered medications for this visit.     Allergies  Allergen Reactions  . Actos [Pioglitazone Hydrochloride]   . Felodipine Er   . Hctz [Hydrochlorothiazide]   . Lisinopril   . Plendil [Felodipine]   .  Amlodipine Besylate Other (See Comments) and Hives  . Brimonidine Tartrate Other (See Comments)    Eye redness and itchiness, blurred vision  . Clonidine Derivatives Rash  . Combigan [Brimonidine Tartrate-Timolol] Other (See Comments)    Eye redness and itchiness, blurred vision  . Dorzolamide Other (See Comments)    Increased eye pressure, blurred vision  . Monopril [Fosinopril Sodium] Cough  . Norvasc [Amlodipine Besylate] Hives  . Penicillins Rash    Has patient had a PCN reaction causing immediate rash, facial/tongue/throat swelling, SOB or lightheadedness with hypotension: unknown Has patient had a PCN reaction causing severe rash involving mucus membranes or skin necrosis: unknown Has patient had a PCN reaction that required hospitalization: unknown Has patient had a PCN reaction occurring within the last 10 years: no If all of the above answers are "NO", then may proceed with Cephalosporin use.     Past Medical History:  Diagnosis Date  . Arthritis   . Chest pain with moderate risk of acute coronary syndrome 11/08/2015  . CHF (congestive heart failure) (HCC)    Diastolic. Precipitated by Actos  . Chronic kidney disease    mild-moderate. GFR was 35 in 02/12  . Coronary artery disease 02/2010   Last Lexiscan 11/2015 low risk.   . DM (diabetes mellitus) ([place])   . Dyslipidemia   . Dyspnea   . GERD (gastroesophageal reflux disease)   . Hypertension   . Nasal polyps   . Pneumonia   . Unspecified hypertensive heart disease without heart failure     Social History   Socioeconomic History  . Marital status: Married    Spouse name: Not on file  . Number of children: 3  . Years of education: Not on file  . Highest education level: Not on file  Occupational History  . Occupation: retired  Scientific laboratory technician  . Financial resource strain: Not on file  . Food insecurity:    Worry: Not on file    Inability: Not on file  . Transportation needs:    Medical: Not on file     Non-medical: Not on file  Tobacco Use  . Smoking status: Never Smoker  . Smokeless tobacco: Never Used  Substance and Sexual Activity  . Alcohol use: No    Alcohol/week: 0.0 oz  . Drug use: No  . Sexual activity: Not on file  Lifestyle  . Physical activity:    Days per week: Not on file    Minutes per session: Not on file  . Stress: Not on file  Relationships  . Social connections:    Talks on phone: Not on file  Gets together: Not on file    Attends religious service: Not on file    Active member of club or organization: Not on file    Attends meetings of clubs or organizations: Not on file    Relationship status: Not on file  . Intimate partner violence:    Fear of current or ex partner: Not on file    Emotionally abused: Not on file    Physically abused: Not on file    Forced sexual activity: Not on file  Other Topics Concern  . Not on file  Social History Narrative  . Not on file     Family History  Problem Relation Age of Onset  . Diabetes Unknown   . Hypertension Unknown   . Stroke Unknown   . Asthma Mother   . Allergic rhinitis Mother   . Congestive Heart Failure Mother   . Stroke Mother      Review of Systems: General: negative for chills, fever, night sweats or weight changes.  Cardiovascular: negative for chest pain, dyspnea on exertion, edema, orthopnea, palpitations, paroxysmal nocturnal dyspnea or shortness of breath Dermatological: negative for rash Respiratory: negative for cough or wheezing Urologic: negative for hematuria Abdominal: negative for nausea, vomiting, diarrhea, bright red blood per rectum, melena, or hematemesis Neurologic: negative for visual changes, syncope, or dizziness All other systems reviewed and are otherwise negative except as noted above.    Blood pressure 132/68, pulse 63, height 5' (1.524 m), weight 129 lb 6.4 oz (58.7 kg), SpO2 99 %.  General appearance: alert, cooperative, appears stated age and no  distress Lungs: clear to auscultation bilaterally Heart: regular rate and rhythm Extremities: no edema Skin: pale, cool, dry Neurologic: Grossly normal   ASSESSMENT AND PLAN:   Acute on chronic diastolic HF (heart failure) (HCC) Stable today  Chest pain with moderate risk of acute coronary syndrome Stable on medical Rx  CKD (chronic kidney disease), stage IV ([place]) Follow up with PCP- now off cozaar  Essential hypertension Labile but under good control for her today   PLAN  F/U Dr Kelly Ruiz in 2 months  Kerin Ransom PA-C 04/16/2017 12:02 PM

## 2017-04-16 NOTE — Assessment & Plan Note (Signed)
Stable on medical Rx

## 2017-04-17 DIAGNOSIS — I2510 Atherosclerotic heart disease of native coronary artery without angina pectoris: Secondary | ICD-10-CM | POA: Diagnosis not present

## 2017-04-17 DIAGNOSIS — I10 Essential (primary) hypertension: Secondary | ICD-10-CM | POA: Diagnosis not present

## 2017-04-17 DIAGNOSIS — E1140 Type 2 diabetes mellitus with diabetic neuropathy, unspecified: Secondary | ICD-10-CM | POA: Diagnosis not present

## 2017-04-17 DIAGNOSIS — D649 Anemia, unspecified: Secondary | ICD-10-CM | POA: Diagnosis not present

## 2017-04-17 DIAGNOSIS — G4700 Insomnia, unspecified: Secondary | ICD-10-CM | POA: Diagnosis not present

## 2017-04-17 DIAGNOSIS — I509 Heart failure, unspecified: Secondary | ICD-10-CM | POA: Diagnosis not present

## 2017-04-17 DIAGNOSIS — R739 Hyperglycemia, unspecified: Secondary | ICD-10-CM | POA: Diagnosis not present

## 2017-04-17 DIAGNOSIS — N183 Chronic kidney disease, stage 3 (moderate): Secondary | ICD-10-CM | POA: Diagnosis not present

## 2017-04-19 DIAGNOSIS — E871 Hypo-osmolality and hyponatremia: Secondary | ICD-10-CM | POA: Diagnosis not present

## 2017-04-19 DIAGNOSIS — I10 Essential (primary) hypertension: Secondary | ICD-10-CM | POA: Diagnosis not present

## 2017-04-19 DIAGNOSIS — I509 Heart failure, unspecified: Secondary | ICD-10-CM | POA: Diagnosis not present

## 2017-04-19 DIAGNOSIS — R739 Hyperglycemia, unspecified: Secondary | ICD-10-CM | POA: Diagnosis not present

## 2017-04-19 DIAGNOSIS — E1140 Type 2 diabetes mellitus with diabetic neuropathy, unspecified: Secondary | ICD-10-CM | POA: Diagnosis not present

## 2017-04-19 DIAGNOSIS — I2510 Atherosclerotic heart disease of native coronary artery without angina pectoris: Secondary | ICD-10-CM | POA: Diagnosis not present

## 2017-04-19 DIAGNOSIS — D649 Anemia, unspecified: Secondary | ICD-10-CM | POA: Diagnosis not present

## 2017-04-19 DIAGNOSIS — N183 Chronic kidney disease, stage 3 (moderate): Secondary | ICD-10-CM | POA: Diagnosis not present

## 2017-04-19 DIAGNOSIS — G4700 Insomnia, unspecified: Secondary | ICD-10-CM | POA: Diagnosis not present

## 2017-04-22 DIAGNOSIS — N183 Chronic kidney disease, stage 3 (moderate): Secondary | ICD-10-CM | POA: Diagnosis not present

## 2017-04-22 DIAGNOSIS — G4700 Insomnia, unspecified: Secondary | ICD-10-CM | POA: Diagnosis not present

## 2017-04-22 DIAGNOSIS — I2510 Atherosclerotic heart disease of native coronary artery without angina pectoris: Secondary | ICD-10-CM | POA: Diagnosis not present

## 2017-04-22 DIAGNOSIS — E1140 Type 2 diabetes mellitus with diabetic neuropathy, unspecified: Secondary | ICD-10-CM | POA: Diagnosis not present

## 2017-04-22 DIAGNOSIS — I10 Essential (primary) hypertension: Secondary | ICD-10-CM | POA: Diagnosis not present

## 2017-04-22 DIAGNOSIS — I509 Heart failure, unspecified: Secondary | ICD-10-CM | POA: Diagnosis not present

## 2017-04-22 DIAGNOSIS — E871 Hypo-osmolality and hyponatremia: Secondary | ICD-10-CM | POA: Diagnosis not present

## 2017-04-22 DIAGNOSIS — R739 Hyperglycemia, unspecified: Secondary | ICD-10-CM | POA: Diagnosis not present

## 2017-04-22 DIAGNOSIS — D649 Anemia, unspecified: Secondary | ICD-10-CM | POA: Diagnosis not present

## 2017-04-25 DIAGNOSIS — E1122 Type 2 diabetes mellitus with diabetic chronic kidney disease: Secondary | ICD-10-CM | POA: Diagnosis not present

## 2017-04-25 DIAGNOSIS — N184 Chronic kidney disease, stage 4 (severe): Secondary | ICD-10-CM | POA: Diagnosis not present

## 2017-04-25 DIAGNOSIS — K922 Gastrointestinal hemorrhage, unspecified: Secondary | ICD-10-CM | POA: Diagnosis not present

## 2017-04-25 DIAGNOSIS — N390 Urinary tract infection, site not specified: Secondary | ICD-10-CM | POA: Diagnosis not present

## 2017-04-25 DIAGNOSIS — Z9181 History of falling: Secondary | ICD-10-CM | POA: Diagnosis not present

## 2017-04-25 DIAGNOSIS — I130 Hypertensive heart and chronic kidney disease with heart failure and stage 1 through stage 4 chronic kidney disease, or unspecified chronic kidney disease: Secondary | ICD-10-CM | POA: Diagnosis not present

## 2017-04-25 DIAGNOSIS — I2510 Atherosclerotic heart disease of native coronary artery without angina pectoris: Secondary | ICD-10-CM | POA: Diagnosis not present

## 2017-04-25 DIAGNOSIS — H409 Unspecified glaucoma: Secondary | ICD-10-CM | POA: Diagnosis not present

## 2017-04-25 DIAGNOSIS — I5033 Acute on chronic diastolic (congestive) heart failure: Secondary | ICD-10-CM | POA: Diagnosis not present

## 2017-04-25 DIAGNOSIS — D62 Acute posthemorrhagic anemia: Secondary | ICD-10-CM | POA: Diagnosis not present

## 2017-04-25 DIAGNOSIS — K219 Gastro-esophageal reflux disease without esophagitis: Secondary | ICD-10-CM | POA: Diagnosis not present

## 2017-04-25 DIAGNOSIS — F0390 Unspecified dementia without behavioral disturbance: Secondary | ICD-10-CM | POA: Diagnosis not present

## 2017-04-25 DIAGNOSIS — E43 Unspecified severe protein-calorie malnutrition: Secondary | ICD-10-CM | POA: Diagnosis not present

## 2017-04-26 DIAGNOSIS — Z6828 Body mass index (BMI) 28.0-28.9, adult: Secondary | ICD-10-CM | POA: Diagnosis not present

## 2017-04-26 DIAGNOSIS — R82998 Other abnormal findings in urine: Secondary | ICD-10-CM | POA: Diagnosis not present

## 2017-04-26 DIAGNOSIS — J310 Chronic rhinitis: Secondary | ICD-10-CM | POA: Diagnosis not present

## 2017-04-27 DIAGNOSIS — E1122 Type 2 diabetes mellitus with diabetic chronic kidney disease: Secondary | ICD-10-CM | POA: Diagnosis not present

## 2017-04-27 DIAGNOSIS — N390 Urinary tract infection, site not specified: Secondary | ICD-10-CM | POA: Diagnosis not present

## 2017-04-27 DIAGNOSIS — F0390 Unspecified dementia without behavioral disturbance: Secondary | ICD-10-CM | POA: Diagnosis not present

## 2017-04-27 DIAGNOSIS — N184 Chronic kidney disease, stage 4 (severe): Secondary | ICD-10-CM | POA: Diagnosis not present

## 2017-04-27 DIAGNOSIS — I130 Hypertensive heart and chronic kidney disease with heart failure and stage 1 through stage 4 chronic kidney disease, or unspecified chronic kidney disease: Secondary | ICD-10-CM | POA: Diagnosis not present

## 2017-04-27 DIAGNOSIS — I5033 Acute on chronic diastolic (congestive) heart failure: Secondary | ICD-10-CM | POA: Diagnosis not present

## 2017-04-30 ENCOUNTER — Telehealth: Payer: Self-pay | Admitting: Cardiology

## 2017-04-30 DIAGNOSIS — I5033 Acute on chronic diastolic (congestive) heart failure: Secondary | ICD-10-CM | POA: Diagnosis not present

## 2017-04-30 DIAGNOSIS — N390 Urinary tract infection, site not specified: Secondary | ICD-10-CM | POA: Diagnosis not present

## 2017-04-30 DIAGNOSIS — F0390 Unspecified dementia without behavioral disturbance: Secondary | ICD-10-CM | POA: Diagnosis not present

## 2017-04-30 DIAGNOSIS — N184 Chronic kidney disease, stage 4 (severe): Secondary | ICD-10-CM | POA: Diagnosis not present

## 2017-04-30 DIAGNOSIS — I130 Hypertensive heart and chronic kidney disease with heart failure and stage 1 through stage 4 chronic kidney disease, or unspecified chronic kidney disease: Secondary | ICD-10-CM | POA: Diagnosis not present

## 2017-04-30 DIAGNOSIS — E1122 Type 2 diabetes mellitus with diabetic chronic kidney disease: Secondary | ICD-10-CM | POA: Diagnosis not present

## 2017-04-30 MED ORDER — FUROSEMIDE 40 MG PO TABS: 40.0000 mg | ORAL_TABLET | Freq: Every day | ORAL | 5 refills | Status: AC

## 2017-04-30 NOTE — Telephone Encounter (Signed)
Returned call to son with PA recommendations. He states patient has already taken lasix 40mg  this AM, advised OK to given additional 20mg . Informed him that after today's lasix dose of 60mg , she should take 40mg  QD. New script sent to pharmacy per request. Advised to call back on Thursday if weight is not down and/or symptoms not improved.

## 2017-04-30 NOTE — Telephone Encounter (Signed)
Spoke with patient's son Hoy Morn. Patient gained 4lbs overnight. She has been gaining a little bit over time. Son reports lasix has been changed during hospitalization & rehab (see below). Patient has SOB when she moving around - this is different for her. She has no bilateral LE edema per son. Patient reports that her chest feels heavy - son feels that there is fluid/swelling in her chest.   Per son, Dr. Luana Shu @ her rehab facility changed her lasix to 20mg  QAM at discharge. Our records had both lasix 40mg  QD and lasix 20mg  QHS + 20mg  PRN for weight gain of 2lbs/SOB/edema. Med list has been updated to reflect that she takes lasix 20mg  QAM  Patient was evaluated for a UTI on 4/5 - on antibiotic Bactrim for treatment.  Only other new medication is glipizide QAM   Per chart review, patient has renal insufficiency. Son reports her creatinine is always 1.5-1.7 and she has seen a nephrologist in the past who stopped all her meds (diuretics?) and she ended up in the hospital.   Advised that patient take total of lasix 40mg  today and will route to MD/PA who last saw patient on 4/2 for additional recommendations.

## 2017-04-30 NOTE — Telephone Encounter (Signed)
Pt c/o swelling: STAT is pt has developed SOB within 24 hours  1) How much weight have you gained and in what time span? 4 lbs  2) If swelling, where is the swelling located? Not really   3) Are you currently taking a fluid pill? Yes   4) Are you currently SOB? Yes   5) Do you have a log of your daily weights (if so, list)? n/a  6) Have you gained 3 pounds in a day or 5 pounds in a week? Yes   7) Have you traveled recently? No

## 2017-04-30 NOTE — Telephone Encounter (Signed)
She should be on Lasix 40 mg daily- she can take 60 mg today- then 40 mg daily. If not better in 48 hrs call back for an Yisroel Ramming PA-C 04/30/2017 9:43 AM

## 2017-05-01 ENCOUNTER — Encounter (HOSPITAL_COMMUNITY): Payer: Self-pay | Admitting: Emergency Medicine

## 2017-05-01 ENCOUNTER — Other Ambulatory Visit: Payer: Self-pay

## 2017-05-01 ENCOUNTER — Inpatient Hospital Stay (HOSPITAL_COMMUNITY)
Admission: EM | Admit: 2017-05-01 | Discharge: 2017-05-09 | DRG: 682 | Disposition: A | Discharge status: Hospice | Payer: Medicare Other | Attending: Internal Medicine | Admitting: Internal Medicine

## 2017-05-01 ENCOUNTER — Telehealth: Payer: Self-pay | Admitting: Cardiology

## 2017-05-01 ENCOUNTER — Emergency Department (HOSPITAL_COMMUNITY): Payer: Medicare Other

## 2017-05-01 DIAGNOSIS — N179 Acute kidney failure, unspecified: Secondary | ICD-10-CM | POA: Diagnosis not present

## 2017-05-01 DIAGNOSIS — E860 Dehydration: Secondary | ICD-10-CM | POA: Diagnosis present

## 2017-05-01 DIAGNOSIS — I10 Essential (primary) hypertension: Secondary | ICD-10-CM | POA: Diagnosis not present

## 2017-05-01 DIAGNOSIS — I5033 Acute on chronic diastolic (congestive) heart failure: Secondary | ICD-10-CM | POA: Diagnosis not present

## 2017-05-01 DIAGNOSIS — R3915 Urgency of urination: Secondary | ICD-10-CM | POA: Diagnosis present

## 2017-05-01 DIAGNOSIS — E872 Acidosis, unspecified: Secondary | ICD-10-CM | POA: Diagnosis present

## 2017-05-01 DIAGNOSIS — R0600 Dyspnea, unspecified: Secondary | ICD-10-CM | POA: Diagnosis present

## 2017-05-01 DIAGNOSIS — I272 Pulmonary hypertension, unspecified: Secondary | ICD-10-CM | POA: Diagnosis present

## 2017-05-01 DIAGNOSIS — K219 Gastro-esophageal reflux disease without esophagitis: Secondary | ICD-10-CM | POA: Diagnosis present

## 2017-05-01 DIAGNOSIS — E861 Hypovolemia: Secondary | ICD-10-CM | POA: Diagnosis present

## 2017-05-01 DIAGNOSIS — Z66 Do not resuscitate: Secondary | ICD-10-CM | POA: Diagnosis present

## 2017-05-01 DIAGNOSIS — E1122 Type 2 diabetes mellitus with diabetic chronic kidney disease: Secondary | ICD-10-CM | POA: Diagnosis present

## 2017-05-01 DIAGNOSIS — Z8701 Personal history of pneumonia (recurrent): Secondary | ICD-10-CM | POA: Diagnosis not present

## 2017-05-01 DIAGNOSIS — Z7984 Long term (current) use of oral hypoglycemic drugs: Secondary | ICD-10-CM

## 2017-05-01 DIAGNOSIS — N183 Chronic kidney disease, stage 3 (moderate): Secondary | ICD-10-CM | POA: Diagnosis not present

## 2017-05-01 DIAGNOSIS — I5032 Chronic diastolic (congestive) heart failure: Secondary | ICD-10-CM | POA: Diagnosis present

## 2017-05-01 DIAGNOSIS — E871 Hypo-osmolality and hyponatremia: Secondary | ICD-10-CM | POA: Diagnosis present

## 2017-05-01 DIAGNOSIS — E875 Hyperkalemia: Secondary | ICD-10-CM | POA: Diagnosis present

## 2017-05-01 DIAGNOSIS — R531 Weakness: Secondary | ICD-10-CM | POA: Diagnosis not present

## 2017-05-01 DIAGNOSIS — Z8679 Personal history of other diseases of the circulatory system: Secondary | ICD-10-CM | POA: Diagnosis not present

## 2017-05-01 DIAGNOSIS — R110 Nausea: Secondary | ICD-10-CM | POA: Diagnosis not present

## 2017-05-01 DIAGNOSIS — M7989 Other specified soft tissue disorders: Secondary | ICD-10-CM | POA: Diagnosis not present

## 2017-05-01 DIAGNOSIS — I340 Nonrheumatic mitral (valve) insufficiency: Secondary | ICD-10-CM | POA: Diagnosis present

## 2017-05-01 DIAGNOSIS — Z888 Allergy status to other drugs, medicaments and biological substances status: Secondary | ICD-10-CM | POA: Diagnosis not present

## 2017-05-01 DIAGNOSIS — I5043 Acute on chronic combined systolic (congestive) and diastolic (congestive) heart failure: Secondary | ICD-10-CM | POA: Diagnosis not present

## 2017-05-01 DIAGNOSIS — R0602 Shortness of breath: Secondary | ICD-10-CM

## 2017-05-01 DIAGNOSIS — I5030 Unspecified diastolic (congestive) heart failure: Secondary | ICD-10-CM | POA: Diagnosis not present

## 2017-05-01 DIAGNOSIS — Z515 Encounter for palliative care: Secondary | ICD-10-CM | POA: Diagnosis not present

## 2017-05-01 DIAGNOSIS — Z8739 Personal history of other diseases of the musculoskeletal system and connective tissue: Secondary | ICD-10-CM | POA: Diagnosis not present

## 2017-05-01 DIAGNOSIS — N170 Acute kidney failure with tubular necrosis: Secondary | ICD-10-CM | POA: Diagnosis not present

## 2017-05-01 DIAGNOSIS — I2510 Atherosclerotic heart disease of native coronary artery without angina pectoris: Secondary | ICD-10-CM | POA: Diagnosis present

## 2017-05-01 DIAGNOSIS — Z8639 Personal history of other endocrine, nutritional and metabolic disease: Secondary | ICD-10-CM | POA: Diagnosis not present

## 2017-05-01 DIAGNOSIS — N184 Chronic kidney disease, stage 4 (severe): Secondary | ICD-10-CM | POA: Diagnosis present

## 2017-05-01 DIAGNOSIS — Z8719 Personal history of other diseases of the digestive system: Secondary | ICD-10-CM | POA: Diagnosis not present

## 2017-05-01 DIAGNOSIS — Z880 Allergy status to penicillin: Secondary | ICD-10-CM

## 2017-05-01 DIAGNOSIS — Z7189 Other specified counseling: Secondary | ICD-10-CM

## 2017-05-01 DIAGNOSIS — T368X5A Adverse effect of other systemic antibiotics, initial encounter: Secondary | ICD-10-CM | POA: Diagnosis present

## 2017-05-01 DIAGNOSIS — E1129 Type 2 diabetes mellitus with other diabetic kidney complication: Secondary | ICD-10-CM | POA: Diagnosis present

## 2017-05-01 DIAGNOSIS — Z79899 Other long term (current) drug therapy: Secondary | ICD-10-CM

## 2017-05-01 DIAGNOSIS — E785 Hyperlipidemia, unspecified: Secondary | ICD-10-CM | POA: Diagnosis present

## 2017-05-01 DIAGNOSIS — Z862 Personal history of diseases of the blood and blood-forming organs and certain disorders involving the immune mechanism: Secondary | ICD-10-CM | POA: Diagnosis not present

## 2017-05-01 DIAGNOSIS — I130 Hypertensive heart and chronic kidney disease with heart failure and stage 1 through stage 4 chronic kidney disease, or unspecified chronic kidney disease: Secondary | ICD-10-CM | POA: Diagnosis present

## 2017-05-01 DIAGNOSIS — N390 Urinary tract infection, site not specified: Secondary | ICD-10-CM | POA: Diagnosis not present

## 2017-05-01 HISTORY — DX: Type 2 diabetes mellitus without complications: E11.9

## 2017-05-01 HISTORY — DX: Personal history of other medical treatment: Z92.89

## 2017-05-01 HISTORY — DX: Chronic kidney disease, stage 4 (severe): N18.4

## 2017-05-01 HISTORY — DX: Anemia, unspecified: D64.9

## 2017-05-01 LAB — URINALYSIS, ROUTINE W REFLEX MICROSCOPIC
Bilirubin Urine: NEGATIVE
GLUCOSE, UA: NEGATIVE mg/dL
HGB URINE DIPSTICK: NEGATIVE
KETONES UR: NEGATIVE mg/dL
Leukocytes, UA: NEGATIVE
Nitrite: NEGATIVE
PROTEIN: NEGATIVE mg/dL
Specific Gravity, Urine: 1.008 (ref 1.005–1.030)
pH: 5 (ref 5.0–8.0)

## 2017-05-01 LAB — BASIC METABOLIC PANEL
ANION GAP: 10 (ref 5–15)
BUN: 91 mg/dL — ABNORMAL HIGH (ref 6–20)
CALCIUM: 8.8 mg/dL — AB (ref 8.9–10.3)
CO2: 17 mmol/L — ABNORMAL LOW (ref 22–32)
Chloride: 98 mmol/L — ABNORMAL LOW (ref 101–111)
Creatinine, Ser: 3.07 mg/dL — ABNORMAL HIGH (ref 0.44–1.00)
GFR, EST AFRICAN AMERICAN: 14 mL/min — AB (ref >60)
GFR, EST NON AFRICAN AMERICAN: 12 mL/min — AB (ref >60)
Glucose, Bld: 173 mg/dL — ABNORMAL HIGH (ref 65–99)
POTASSIUM: 5.8 mmol/L — AB (ref 3.5–5.1)
Sodium: 125 mmol/L — ABNORMAL LOW (ref 135–145)

## 2017-05-01 LAB — BRAIN NATRIURETIC PEPTIDE: B NATRIURETIC PEPTIDE 5: 517.7 pg/mL — AB (ref 0.0–100.0)

## 2017-05-01 LAB — I-STAT TROPONIN, ED: TROPONIN I, POC: 0.01 ng/mL (ref 0.00–0.08)

## 2017-05-01 LAB — CBC
HCT: 28.7 % — ABNORMAL LOW (ref 36.0–46.0)
HEMOGLOBIN: 9.3 g/dL — AB (ref 12.0–15.0)
MCH: 32 pg (ref 26.0–34.0)
MCHC: 32.4 g/dL (ref 30.0–36.0)
MCV: 98.6 fL (ref 78.0–100.0)
Platelets: 136 10*3/uL — ABNORMAL LOW (ref 150–400)
RBC: 2.91 MIL/uL — AB (ref 3.87–5.11)
RDW: 13.1 % (ref 11.5–15.5)
WBC: 5.5 10*3/uL (ref 4.0–10.5)

## 2017-05-01 LAB — CREATININE, URINE, RANDOM: Creatinine, Urine: 20.83 mg/dL

## 2017-05-01 LAB — SODIUM, URINE, RANDOM: Sodium, Ur: 62 mmol/L

## 2017-05-01 MED ORDER — MONTELUKAST SODIUM 10 MG PO TABS
10.0000 mg | ORAL_TABLET | Freq: Every day | ORAL | Status: DC
  Administered 2017-05-02 – 2017-05-08 (×8): 10 mg via ORAL
  Filled 2017-05-01 (×8): qty 1

## 2017-05-01 MED ORDER — ACETAMINOPHEN 650 MG RE SUPP: 650.0000 mg | Freq: Four times a day (QID) | RECTAL | Status: DC | PRN

## 2017-05-01 MED ORDER — ONDANSETRON HCL 4 MG PO TABS
4.0000 mg | ORAL_TABLET | Freq: Four times a day (QID) | ORAL | Status: DC | PRN
  Filled 2017-05-01: qty 1

## 2017-05-01 MED ORDER — TIMOLOL MALEATE 0.5 % OP SOLN
1.0000 [drp] | Freq: Two times a day (BID) | OPHTHALMIC | Status: DC
  Administered 2017-05-02 – 2017-05-09 (×16): 1 [drp] via OPHTHALMIC
  Filled 2017-05-01 (×2): qty 5

## 2017-05-01 MED ORDER — ACETAMINOPHEN 325 MG PO TABS
650.0000 mg | ORAL_TABLET | Freq: Four times a day (QID) | ORAL | Status: DC | PRN
  Administered 2017-05-03 – 2017-05-07 (×6): 650 mg via ORAL
  Filled 2017-05-01 (×6): qty 2

## 2017-05-01 MED ORDER — PANTOPRAZOLE SODIUM 40 MG PO TBEC
40.0000 mg | DELAYED_RELEASE_TABLET | Freq: Every day | ORAL | Status: DC
  Administered 2017-05-03 – 2017-05-09 (×7): 40 mg via ORAL
  Filled 2017-05-01 (×8): qty 1

## 2017-05-01 MED ORDER — STERILE WATER FOR INJECTION IV SOLN
INTRAVENOUS | Status: DC
  Administered 2017-05-02: 01:00:00 via INTRAVENOUS
  Filled 2017-05-01 (×4): qty 850

## 2017-05-01 MED ORDER — ONDANSETRON HCL 4 MG/2ML IJ SOLN: 4.0000 mg | Freq: Four times a day (QID) | INTRAMUSCULAR | Status: DC | PRN

## 2017-05-01 MED ORDER — SODIUM CHLORIDE 0.9 % IV BOLUS
750.0000 mL | Freq: Once | INTRAVENOUS | Status: AC
  Administered 2017-05-02: 750 mL via INTRAVENOUS

## 2017-05-01 MED ORDER — FLUTICASONE PROPIONATE 50 MCG/ACT NA SUSP
2.0000 | Freq: Two times a day (BID) | NASAL | Status: DC
  Administered 2017-05-02 – 2017-05-09 (×16): 2 via NASAL
  Filled 2017-05-01 (×2): qty 16

## 2017-05-01 MED ORDER — CARVEDILOL 6.25 MG PO TABS
6.2500 mg | ORAL_TABLET | Freq: Two times a day (BID) | ORAL | Status: DC
  Administered 2017-05-02 – 2017-05-07 (×11): 6.25 mg via ORAL
  Filled 2017-05-01 (×11): qty 1

## 2017-05-01 MED ORDER — GUAIFENESIN ER 600 MG PO TB12
600.0000 mg | ORAL_TABLET | Freq: Every day | ORAL | Status: DC
  Administered 2017-05-02 – 2017-05-08 (×8): 600 mg via ORAL
  Filled 2017-05-01 (×8): qty 1

## 2017-05-01 MED ORDER — DOCUSATE SODIUM 100 MG PO CAPS: 100.0000 mg | ORAL_CAPSULE | Freq: Two times a day (BID) | ORAL | Status: DC | PRN

## 2017-05-01 MED ORDER — OXYMETAZOLINE HCL 0.05 % NA SOLN
2.0000 | Freq: Two times a day (BID) | NASAL | Status: DC
  Administered 2017-05-02 – 2017-05-09 (×15): 2 via NASAL
  Filled 2017-05-01 (×2): qty 15

## 2017-05-01 MED ORDER — VITAMIN B-12 1000 MCG PO TABS
1000.0000 ug | ORAL_TABLET | Freq: Every day | ORAL | Status: DC
Start: 2017-05-02 — End: 2017-05-09
  Administered 2017-05-02 – 2017-05-09 (×8): 1000 ug via ORAL
  Filled 2017-05-01 (×8): qty 1

## 2017-05-01 MED ORDER — BRINZOLAMIDE 1 % OP SUSP
1.0000 [drp] | Freq: Two times a day (BID) | OPHTHALMIC | Status: DC
  Administered 2017-05-02 – 2017-05-09 (×16): 1 [drp] via OPHTHALMIC
  Filled 2017-05-01 (×2): qty 10

## 2017-05-01 MED ORDER — DOXAZOSIN MESYLATE 2 MG PO TABS
2.0000 mg | ORAL_TABLET | Freq: Every day | ORAL | Status: DC
  Administered 2017-05-02 – 2017-05-08 (×8): 2 mg via ORAL
  Filled 2017-05-01 (×8): qty 1

## 2017-05-01 MED ORDER — ENSURE ENLIVE PO LIQD
237.0000 mL | Freq: Two times a day (BID) | ORAL | Status: DC
  Administered 2017-05-02 – 2017-05-09 (×14): 237 mL via ORAL
  Filled 2017-05-01: qty 237

## 2017-05-01 MED ORDER — ISOSORBIDE MONONITRATE ER 60 MG PO TB24
240.0000 mg | ORAL_TABLET | Freq: Every day | ORAL | Status: DC
  Administered 2017-05-02 – 2017-05-09 (×8): 240 mg via ORAL
  Filled 2017-05-01 (×8): qty 4

## 2017-05-01 MED ORDER — TRAZODONE HCL 150 MG PO TABS
75.0000 mg | ORAL_TABLET | Freq: Every day | ORAL | Status: DC
  Administered 2017-05-02 – 2017-05-08 (×8): 75 mg via ORAL
  Filled 2017-05-01 (×5): qty 1
  Filled 2017-05-01: qty 2
  Filled 2017-05-01 (×2): qty 1

## 2017-05-01 MED ORDER — DICYCLOMINE HCL 10 MG PO CAPS
10.0000 mg | ORAL_CAPSULE | Freq: Three times a day (TID) | ORAL | Status: DC
  Administered 2017-05-02 – 2017-05-09 (×22): 10 mg via ORAL
  Filled 2017-05-01 (×22): qty 1

## 2017-05-01 MED ORDER — INSULIN ASPART 100 UNIT/ML ~~LOC~~ SOLN
0.0000 [IU] | Freq: Three times a day (TID) | SUBCUTANEOUS | Status: DC
  Administered 2017-05-02: 2 [IU] via SUBCUTANEOUS
  Administered 2017-05-03: 3 [IU] via SUBCUTANEOUS
  Administered 2017-05-03: 2 [IU] via SUBCUTANEOUS
  Administered 2017-05-04: 3 [IU] via SUBCUTANEOUS
  Administered 2017-05-04: 2 [IU] via SUBCUTANEOUS
  Administered 2017-05-05: 1 [IU] via SUBCUTANEOUS
  Administered 2017-05-05: 2 [IU] via SUBCUTANEOUS
  Administered 2017-05-05: 3 [IU] via SUBCUTANEOUS
  Administered 2017-05-06 – 2017-05-08 (×4): 2 [IU] via SUBCUTANEOUS
  Administered 2017-05-08: 3 [IU] via SUBCUTANEOUS
  Administered 2017-05-09: 2 [IU] via SUBCUTANEOUS
  Filled 2017-05-01: qty 1

## 2017-05-01 MED ORDER — SODIUM POLYSTYRENE SULFONATE 15 GM/60ML PO SUSP
15.0000 g | Freq: Once | ORAL | Status: AC
  Administered 2017-05-01: 15 g via ORAL
  Filled 2017-05-01: qty 60

## 2017-05-01 MED ORDER — LATANOPROST 0.005 % OP SOLN
1.0000 [drp] | Freq: Every day | OPHTHALMIC | Status: DC
  Administered 2017-05-02 – 2017-05-08 (×8): 1 [drp] via OPHTHALMIC
  Filled 2017-05-01 (×2): qty 2.5

## 2017-05-01 MED ORDER — VITAMIN C 500 MG PO TABS
500.0000 mg | ORAL_TABLET | Freq: Every day | ORAL | Status: DC
  Administered 2017-05-02 – 2017-05-09 (×8): 500 mg via ORAL
  Filled 2017-05-01 (×8): qty 1

## 2017-05-01 MED ORDER — HYDRALAZINE HCL 50 MG PO TABS
100.0000 mg | ORAL_TABLET | Freq: Three times a day (TID) | ORAL | Status: DC
  Administered 2017-05-02 – 2017-05-09 (×22): 100 mg via ORAL
  Filled 2017-05-01 (×23): qty 2

## 2017-05-01 MED ORDER — HEPARIN SODIUM (PORCINE) 5000 UNIT/ML IJ SOLN
5000.0000 [IU] | Freq: Three times a day (TID) | INTRAMUSCULAR | Status: DC
  Administered 2017-05-02 – 2017-05-09 (×23): 5000 [IU] via SUBCUTANEOUS
  Filled 2017-05-01 (×22): qty 1

## 2017-05-01 MED ORDER — SODIUM CHLORIDE 0.9 % IV BOLUS
250.0000 mL | Freq: Once | INTRAVENOUS | Status: AC
  Administered 2017-05-01: 250 mL via INTRAVENOUS

## 2017-05-01 MED ORDER — FERROUS SULFATE 325 (65 FE) MG PO TABS
325.0000 mg | ORAL_TABLET | Freq: Every day | ORAL | Status: DC
  Administered 2017-05-02 – 2017-05-09 (×8): 325 mg via ORAL
  Filled 2017-05-01 (×8): qty 1

## 2017-05-01 NOTE — Telephone Encounter (Signed)
Follow UP;    Son is calling,says pt's Furosemide was increased yesterday.Pt did not lose any weight,she gained 8 ounces.He wants to know how pt should take her Furosemide today.

## 2017-05-01 NOTE — ED Provider Notes (Addendum)
[facility] EMERGENCY DEPARTMENT Provider Note   CSN: [id number] Arrival date & time: 05/01/17  1404     History   Chief Complaint Chief Complaint  Patient presents with  . Shortness of Breath  . Leg Swelling    HPI Kelly Ruiz is a 84 y.o. female.  HPI Pt presents to the ED for shortness of breath, leg swelling and weakness.  Pt feels like this has been progressing over the last several days.  Her family has spoken to nephrology and they tripled her diuretic.  She was in rehab and was on 20 mg per day.  She doubled to 74 yesterday which she was discharged on and today they added another 20 mg but she still is not urinating much and her symptoms are getting worse.   Appetite is poor.  NO cp. No diarrhea.  One episode of vomiting yesterday.  Past Medical History:  Diagnosis Date  . Arthritis   . Chest pain with moderate risk of acute coronary syndrome 11/08/2015  . CHF (congestive heart failure) (HCC)    Diastolic. Precipitated by Actos  . Chronic kidney disease    mild-moderate. GFR was 35 in 02/12  . Coronary artery disease 02/2010   Last Lexiscan 11/2015 low risk.   . DM (diabetes mellitus) ([place])   . Dyslipidemia   . Dyspnea   . GERD (gastroesophageal reflux disease)   . Hypertension   . Nasal polyps   . Pneumonia   . Unspecified hypertensive heart disease without heart failure     Patient Active Problem List   Diagnosis Date Noted  . AKI (acute kidney injury) ([place]) 03/26/2017  . CKD (chronic kidney disease), stage IV ([place]) 02/11/2017  . Acute on chronic diastolic HF (heart failure) ([place]) 12/05/2016  . Symptomatic anemia 12/04/2016  . Hyperkalemia 12/04/2016  . Degenerative disc disease, lumbar 11/05/2016  . Dyspnea, unspecified 04/10/2016  . Chronic renal insufficiency, stage IV (severe) ([place]) 11/08/2015  . Chest pain with moderate risk of acute coronary syndrome 11/08/2015  . 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 Chronic diastolic congestive heart failure ([place])   . Essential hypertension     Past Surgical History:  Procedure Laterality Date  . APPENDECTOMY  1951  . CATARACT EXTRACTION, BILATERAL    . CHOLECYSTECTOMY    . GLAUCOMA SURGERY  10/29/2013  . hysterectomy - unknown type    . NECK MASS EXCISION     non cancerous  . TONSILLECTOMY       OB History   None      Home Medications    Prior to Admission medications   Medication Sig Start Date End Date Taking? Authorizing Provider  acetaminophen (TYLENOL) 500 MG tablet Take 1,000 mg by mouth 2 (two) times daily.     [provider]  brinzolamide (AZOPT) 1 % ophthalmic suspension Place 1 drop into the right eye 2 (two) times daily.     [provider]  butalbital-acetaminophen-caffeine (FIORICET, ESGIC) 2515657949 MG tablet Take 1 tablet by mouth every 6 (six) hours as needed for headache. 04/04/17   Lavina Hamman, MD  carvedilol (COREG) 6.25 MG tablet Take 1 tablet (6.25 mg total) by mouth 2 (two) times daily with a meal. 04/04/17   Lavina Hamman, MD  Cyanocobalamin (B-12 COMPLIANCE INJECTION IJ) Inject as directed. Inject 1cc intramuscularly in the evening on the 27th and ending on the 27th every month for prophylaxis    [provider]  dicyclomine (BENTYL) 10 MG capsule Take 10 mg by mouth 3 (three) times daily before meals.     [provider]  docusate sodium (COLACE) 100 MG capsule Take 1 capsule (100 mg total) by mouth 2 (two) times daily as needed for mild constipation. 04/04/17   Lavina Hamman, MD  doxazosin (CARDURA) 2 MG tablet Take 1 tablet (2 mg total) by mouth at bedtime. 04/04/17   Lavina Hamman, MD  feeding supplement, ENSURE ENLIVE, (ENSURE ENLIVE) LIQD Take 237 mLs by mouth 2 (two) times daily between meals. 04/04/17   Lavina Hamman, MD  ferrous sulfate 325 (65 FE) MG tablet Take 325 mg by mouth 2 (two) times daily with a meal.     [provider]    fluticasone (FLONASE) 50 MCG/ACT nasal spray Place 2 sprays into both nostrils 2 (two) times daily.    [provider]  furosemide (LASIX) 40 MG tablet Take 1 tablet (40 mg total) by mouth daily. 04/30/17   Erlene Quan, PA-C  GLIPIZIDE PO Take by mouth every morning.    [provider]  guaiFENesin (MUCINEX) 600 MG 12 hr tablet Take 1 tablet (600 mg total) by mouth 2 (two) times daily. 04/04/17   Lavina Hamman, MD  hydrALAZINE (APRESOLINE) 100 MG tablet Take 1 tablet (100 mg total) by mouth 3 (three) times daily. 04/04/17   Lavina Hamman, MD  isosorbide mononitrate (IMDUR) 120 MG 24 hr tablet Take 2 tablets (240 mg total) by mouth daily. 05/03/16   Minus Breeding, MD  latanoprost (XALATAN) 0.005 % ophthalmic solution Place 1 drop into the right eye at bedtime.  01/20/13   [provider]  Multiple Vitamins-Minerals (EYE VITAMINS PO) Take 1 tablet by mouth 2 (two) times daily.     [provider]  nitroGLYCERIN (NITROSTAT) 0.4 MG SL tablet Place 1 tablet (0.4 mg total) under the tongue every 5 (five) minutes as needed for chest pain. 03/22/15   Minus Breeding, MD  omeprazole (PRILOSEC) 20 MG capsule Take 20 mg by mouth daily.    [provider]  timolol (TIMOPTIC) 0.5 % ophthalmic solution Place 1 drop into the right eye 2 (two) times daily.  01/14/12   [provider]  traZODone (DESYREL) 50 MG tablet Take 75 mg by mouth at bedtime. 09/28/16   [provider]  vitamin B-12 (CYANOCOBALAMIN) 1000 MCG tablet Take 1,000 mcg by mouth daily.    [provider]  vitamin C (ASCORBIC ACID) 500 MG tablet Take 500 mg by mouth 2 (two) times daily.     [provider]    Family History Family History  Problem Relation Age of Onset  . Diabetes Unknown   . Hypertension Unknown   . Stroke Unknown   . Asthma Mother   . Allergic rhinitis Mother   . Congestive Heart Failure Mother   . Stroke Mother     Social History Social  History   Tobacco Use  . Smoking status: Never Smoker  . Smokeless tobacco: Never Used  Substance Use Topics  . Alcohol use: No    Alcohol/week: 0.0 oz  . Drug use: No     Allergies   Actos [pioglitazone hydrochloride]; Felodipine er; Hctz [hydrochlorothiazide]; Lisinopril; Plendil [felodipine]; Amlodipine besylate; Brimonidine tartrate; Clonidine derivatives; Combigan [brimonidine tartrate-timolol]; Dorzolamide; Monopril [fosinopril sodium]; Norvasc [amlodipine besylate]; and Penicillins   Review of Systems Review of Systems  Constitutional: Negative for fever.  Gastrointestinal: Positive for nausea. Negative for diarrhea.  All other systems reviewed and are negative.    Physical Exam Updated Vital Signs BP (!) 157/48   Pulse 67   Temp 97.8 F (36.6 C) (Oral)   Resp 14   Ht 1.524 m (5')   Wt 67 kg (147 lb 12.8 oz)   SpO2 96%   BMI 28.87 kg/m   Physical Exam   ED Treatments / Results  Labs (all labs ordered are listed, but only abnormal results are displayed) Labs Reviewed  BASIC METABOLIC PANEL - Abnormal; Notable for the following components:      Result Value   Sodium 125 (*)    Potassium 5.8 (*)    Chloride 98 (*)    CO2 17 (*)    Glucose, Bld 173 (*)    BUN 91 (*)    Creatinine, Ser 3.07 (*)    Calcium 8.8 (*)    GFR calc non Af Amer 12 (*)    GFR calc Af Amer 14 (*)    All other components within normal limits  CBC - Abnormal; Notable for the following components:   RBC 2.91 (*)    Hemoglobin 9.3 (*)    HCT 28.7 (*)    Platelets 136 (*)    All other components within normal limits  BRAIN NATRIURETIC PEPTIDE - Abnormal; Notable for the following components:   B Natriuretic Peptide 517.7 (*)    All other components within normal limits  I-STAT TROPONIN, ED    EKG EKG Interpretation  Date/Time:  Wednesday May 01 2017 15:33:40 EDT Ventricular Rate:  65 PR Interval:  202 QRS Duration: 118 QT Interval:  408 QTC Calculation: 424 R  Axis:   -52 Text Interpretation:  Normal sinus rhythm Left anterior fascicular block Left ventricular hypertrophy with QRS widening and repolarization abnormality Abnormal ECG No significant change since last tracing Confirmed by Dorie Rank [phone]) on 05/01/2017 7:34:40 PM   Radiology Dg Chest 2 View  Result Date: 05/01/2017 CLINICAL DATA:  Worsening shortness of breath and weight gain over the last 4 days. EXAM: CHEST - 2 VIEW COMPARISON:  03/30/2017 FINDINGS: Chronic cardiomegaly and aortic atherosclerosis. Pulmonary venous hypertension. Small effusions with mild dependent atelectasis. No consolidation or lobar collapse. IMPRESSION: Cardiomegaly. Venous hypertension. Small effusions and mild dependent atelectasis. Electronically Signed   By: Nelson Chimes M.D.   On: 05/01/2017 16:41    Procedures Procedures (including critical care time)  Medications Ordered in ED Medications  sodium chloride 0.9 % bolus 250 mL (has no administration in time range)  sodium polystyrene (KAYEXALATE) 15 GM/60ML suspension 15 g (has no administration in time range)     Initial Impression / Assessment and Plan / ED Course  I have reviewed the triage vital signs and the nursing notes.  Pertinent labs & imaging results that were available during my care of the patient were reviewed by me and considered in my medical decision making (see chart for details).  Clinical Course as of May 02 2003  Wed May 01, 2017  6811 Basic metabolic panel(!) [JK]  5726 Multiple electrolyte abnormalities including decreased bicarb, decreased sodium, increased creatinine and potassium associated with worsening kidney injury.    Basic metabolic panel(!) [JK]  2035 Anemia is stable  CBC(!) [JK]  2003 Chest x-ray does show some small effusions   [JK]    Clinical Course User Index [JK] Dorie Rank, MD  Patient presents to the emergency room for evaluation of increasing weakness and weight gain.  Patient was recently in the  hospital for issues with congestive heart failure and acute kidney injury.  Patient's laboratory test today show that she is having worsening acute kidney injury.  This may be related to her medications and also her worsening kidney disease.  Patient has had her occasions adjusted recently and unfortunately her kidney injury is worse.  This may be a combination of overall increased total body fluid with intravascular depletion.  I will give her a small dose of intravenous fluids.  I will consult the medical service for admission and further treatmetn.  Final Clinical Impressions(s) / ED Diagnoses   Final diagnoses:  AKI (acute kidney injury) ([place])  Hyperkalemia      Dorie Rank, MD 05/01/17 2005   D/w Dr Justin Mend who will see pt in consultation   Dorie Rank, MD 05/01/17 2048

## 2017-05-01 NOTE — ED Triage Notes (Signed)
Pt presents with increasing SOB and weight gain since Saturday; family reports medication changes with last admission and attempting to adjust but afraid shes getting worse

## 2017-05-01 NOTE — ED Notes (Signed)
Hospitalist at bedside 

## 2017-05-01 NOTE — H&P (Signed)
History and Physical    Kelly Ruiz PXT:[id number] DOB: September 09, 1923 DOA: 05/01/2017  PCP: Ronita Hipps, MD  Patient coming from: Home  I have personally briefly reviewed patient's old medical records in [place]  Chief Complaint: SOB, weakness  HPI: Kelly Ruiz is a 84 y.o. female with medical history significant of diastolic CHF, CKD stage 4, HTN, DM2.  Patient recently admitted to our service from 3/12-3/21 for AKI believed to be pre-renal and diuretic induced.  She was initially hydrated until 3/16 with improving kidney function, then developed acute on chronic diastolic CHF due to fluid overload.  She was diuresed from that point on until discharge.  Creat at discharge was 1.48 down from 3.9 on admission.  She was discharged on 40mg  lasix daily which was apparently switched to 20mg  lasix daily at the SNF.  Despite this she has been having several day history of worsening weakness, SOB, reported wt gain.  Cardiology tried increasing her lasix yesterday over the phone with 60mg  yesterday and today (planning on 40mg  chronically), but patient presents to ED today.   ED Course: In ED patient with AKI and BMP very similar to last presentation: Sodium 125, K 5.8, bicarb 17, BUN 91, creat 3.0.  CXR neg for pulm edema, BNP is 517.  Of note it was 1844 on 3/16 (when she was felt to be fluid overloaded after initial couple days of hydration) last admission.   Review of Systems: As per HPI otherwise 10 point review of systems negative.   Past Medical History:  Diagnosis Date  . Arthritis   . Chest pain with moderate risk of acute coronary syndrome 11/08/2015  . CHF (congestive heart failure) (HCC)    Diastolic. Precipitated by Actos  . Chronic kidney disease    mild-moderate. GFR was 35 in 02/12  . Coronary artery disease 02/2010   Last Lexiscan 11/2015 low risk.   . DM (diabetes mellitus) ([place])   . Dyslipidemia   . Dyspnea   . GERD (gastroesophageal reflux disease)   .  Hypertension   . Nasal polyps   . Pneumonia   . Unspecified hypertensive heart disease without heart failure     Past Surgical History:  Procedure Laterality Date  . APPENDECTOMY  1951  . CATARACT EXTRACTION, BILATERAL    . CHOLECYSTECTOMY    . GLAUCOMA SURGERY  10/29/2013  . hysterectomy - unknown type    . NECK MASS EXCISION     non cancerous  . TONSILLECTOMY       reports that she has never smoked. She has never used smokeless tobacco. She reports that she does not drink alcohol or use drugs.  Allergies  Allergen Reactions  . Actos [Pioglitazone Hydrochloride]   . Felodipine Er   . Gabapentin Other (See Comments)    Went to sleep and could not wake up  . Hctz [Hydrochlorothiazide]   . Lisinopril   . Plendil [Felodipine]   . Amlodipine Besylate Other (See Comments) and Hives  . Brimonidine Tartrate Other (See Comments)    Eye redness and itchiness, blurred vision  . Clonidine Derivatives Rash  . Combigan [Brimonidine Tartrate-Timolol] Other (See Comments)    Eye redness and itchiness, blurred vision  . Dorzolamide Other (See Comments)    Increased eye pressure, blurred vision  . Monopril [Fosinopril Sodium] Cough  . Norvasc [Amlodipine Besylate] Hives  . Penicillins Rash    Has patient had a PCN reaction causing immediate rash, facial/tongue/throat swelling, SOB or lightheadedness with hypotension: unknown  Has patient had a PCN reaction causing severe rash involving mucus membranes or skin necrosis: unknown Has patient had a PCN reaction that required hospitalization: unknown Has patient had a PCN reaction occurring within the last 10 years: no If all of the above answers are "NO", then may proceed with Cephalosporin use.     Family History  Problem Relation Age of Onset  . Diabetes Unknown   . Hypertension Unknown   . Stroke Unknown   . Asthma Mother   . Allergic rhinitis Mother   . Congestive Heart Failure Mother   . Stroke Mother      Prior to  Admission medications   Medication Sig Start Date End Date Taking? Authorizing Provider  acetaminophen (TYLENOL) 500 MG tablet Take 1,000 mg by mouth 2 (two) times daily.    Yes [provider]  brinzolamide (AZOPT) 1 % ophthalmic suspension Place 1 drop into the right eye 2 (two) times daily.    Yes [provider]  butalbital-acetaminophen-caffeine (FIORICET, ESGIC) 50-325-40 MG tablet Take 1 tablet by mouth every 6 (six) hours as needed for headache. 04/04/17  Yes Lavina Hamman, MD  carvedilol (COREG) 6.25 MG tablet Take 1 tablet (6.25 mg total) by mouth 2 (two) times daily with a meal. 04/04/17  Yes Lavina Hamman, MD  Cyanocobalamin (B-12 COMPLIANCE INJECTION IJ) Inject as directed. Inject 1cc intramuscularly in the evening on the 27th and ending on the 27th every month for prophylaxis   Yes [provider]  dicyclomine (BENTYL) 10 MG capsule Take 10 mg by mouth 3 (three) times daily before meals.    Yes [provider]  docusate sodium (COLACE) 100 MG capsule Take 1 capsule (100 mg total) by mouth 2 (two) times daily as needed for mild constipation. 04/04/17  Yes Lavina Hamman, MD  doxazosin (CARDURA) 2 MG tablet Take 1 tablet (2 mg total) by mouth at bedtime. 04/04/17  Yes Lavina Hamman, MD  feeding supplement, ENSURE ENLIVE, (ENSURE ENLIVE) LIQD Take 237 mLs by mouth 2 (two) times daily between meals. Patient taking differently: Take 237 mLs by mouth 2 (two) times daily between meals. Strawberry 04/04/17  Yes Lavina Hamman, MD  ferrous sulfate 325 (65 FE) MG tablet Take 325 mg by mouth daily.    Yes [provider]  fluticasone (FLONASE) 50 MCG/ACT nasal spray Place 2 sprays into both nostrils 2 (two) times daily.   Yes [provider]  furosemide (LASIX) 40 MG tablet Take 1 tablet (40 mg total) by mouth daily. 04/30/17  Yes Kilroy, Luke K, PA-C  GLIPIZIDE PO Take 2 mg by mouth every morning.    Yes [provider]  guaiFENesin  (MUCINEX) 600 MG 12 hr tablet Take 1 tablet (600 mg total) by mouth 2 (two) times daily. Patient taking differently: Take 600 mg by mouth at bedtime.  04/04/17  Yes Lavina Hamman, MD  hydrALAZINE (APRESOLINE) 100 MG tablet Take 1 tablet (100 mg total) by mouth 3 (three) times daily. 04/04/17  Yes Lavina Hamman, MD  isosorbide mononitrate (IMDUR) 120 MG 24 hr tablet Take 2 tablets (240 mg total) by mouth daily. 05/03/16  Yes Minus Breeding, MD  latanoprost (XALATAN) 0.005 % ophthalmic solution Place 1 drop into the right eye at bedtime.  01/20/13  Yes [provider]  Melatonin 3 MG TABS Take 3 mg by mouth at bedtime.   Yes [provider]  montelukast (SINGULAIR) 10 MG tablet Take 10 mg by mouth  at bedtime.   Yes [provider]  Multiple Vitamins-Minerals (EYE VITAMINS PO) Take 1 tablet by mouth 2 (two) times daily. Lunch and at bedtime prervision Ared 2   Yes [provider]  nitroGLYCERIN (NITROSTAT) 0.4 MG SL tablet Place 1 tablet (0.4 mg total) under the tongue every 5 (five) minutes as needed for chest pain. 03/22/15  Yes Minus Breeding, MD  omeprazole (PRILOSEC) 20 MG capsule Take 20 mg by mouth daily.   Yes [provider]  oxymetazoline (AFRIN) 0.05 % nasal spray Place 2 sprays into both nostrils 2 (two) times daily. 20 minutes before the Flonase Nasal   Yes [provider]  sulfamethoxazole-trimethoprim (BACTRIM DS,SEPTRA DS) 800-160 MG tablet Take 1 tablet by mouth 2 (two) times daily. For urinary tract infection   Yes [provider]  timolol (TIMOPTIC) 0.5 % ophthalmic solution Place 1 drop into the right eye 2 (two) times daily.  01/14/12  Yes [provider]  traZODone (DESYREL) 50 MG tablet Take 75 mg by mouth at bedtime. 09/28/16  Yes [provider]  vitamin B-12 (CYANOCOBALAMIN) 1000 MCG tablet Take 1,000 mcg by mouth daily.   Yes [provider]  vitamin C (ASCORBIC ACID) 500 MG tablet Take 500  mg by mouth daily.    Yes [provider]    Physical Exam: Vitals:   05/01/17 1945 05/01/17 2000 05/01/17 2015 05/01/17 2045  BP: (!) 157/48 (!) 153/52 (!) 136/46 (!) 166/53  Pulse: 67 62 64 66  Resp: 14 13 20 20   Temp:      TempSrc:      SpO2: 96% 98% 96% 97%  Weight:      Height:        Constitutional: NAD, calm, comfortable Eyes: PERRL, lids and conjunctivae normal ENMT: Mucous membranes are dry. Posterior pharynx clear of any exudate or lesions.Normal dentition.  Neck: normal, supple, no masses, no thyromegaly Respiratory: clear to auscultation bilaterally, no wheezing, no crackles. Normal respiratory effort. No accessory muscle use. Patient laying flat in bed. Cardiovascular: Regular rate and rhythm, no murmurs / rubs / gallops. Trace edema. 2+ pedal pulses. No carotid bruits.  Abdomen: no tenderness, no masses palpated. No hepatosplenomegaly. Bowel sounds positive.  Musculoskeletal: no clubbing / cyanosis. No joint deformity upper and lower extremities. Good ROM, no contractures. Normal muscle tone.  Skin: no rashes, lesions, ulcers. No induration Neurologic: CN 2-12 grossly intact. Sensation intact, DTR normal. Strength 5/5 in all 4.  Psychiatric: Normal judgment and insight. Alert and oriented x 3. Normal mood.    Labs on Admission: I have personally reviewed following labs and imaging studies  CBC: Recent Labs  Lab 05/01/17 1610  WBC 5.5  HGB 9.3*  HCT 28.7*  MCV 98.6  PLT 253*   Basic Metabolic Panel: Recent Labs  Lab 05/01/17 1610  NA 125*  K 5.8*  CL 98*  CO2 17*  GLUCOSE 173*  BUN 91*  CREATININE 3.07*  CALCIUM 8.8*   GFR: Estimated Creatinine Clearance: 10.2 mL/min (A) (by C-G formula based on SCr of 3.07 mg/dL (H)). Liver Function Tests: No results for input(s): AST, ALT, ALKPHOS, BILITOT, PROT, ALBUMIN in the last 168 hours. No results for input(s): LIPASE, AMYLASE in the last 168 hours. No results for input(s): AMMONIA in the last  168 hours. Coagulation Profile: No results for input(s): INR, PROTIME in the last 168 hours. Cardiac Enzymes: No results for input(s): CKTOTAL, CKMB, CKMBINDEX, TROPONINI in the last 168 hours. BNP (last 3 results)  No results for input(s): PROBNP in the last 8760 hours. HbA1C: No results for input(s): HGBA1C in the last 72 hours. CBG: No results for input(s): GLUCAP in the last 168 hours. Lipid Profile: No results for input(s): CHOL, HDL, LDLCALC, TRIG, CHOLHDL, LDLDIRECT in the last 72 hours. Thyroid Function Tests: No results for input(s): TSH, T4TOTAL, FREET4, T3FREE, THYROIDAB in the last 72 hours. Anemia Panel: No results for input(s): VITAMINB12, FOLATE, FERRITIN, TIBC, IRON, RETICCTPCT in the last 72 hours. Urine analysis:    Component Value Date/Time   COLORURINE STRAW (A) 03/26/2017 1429   APPEARANCEUR CLEAR 03/26/2017 1429   LABSPEC 1.010 03/26/2017 1429   PHURINE 5.0 03/26/2017 1429   GLUCOSEU NEGATIVE 03/26/2017 1429   HGBUR NEGATIVE 03/26/2017 1429   BILIRUBINUR NEGATIVE 03/26/2017 1429   KETONESUR NEGATIVE 03/26/2017 1429   PROTEINUR NEGATIVE 03/26/2017 1429   NITRITE NEGATIVE 03/26/2017 1429   LEUKOCYTESUR SMALL (A) 03/26/2017 1429    Radiological Exams on Admission: Dg Chest 2 View  Result Date: 05/01/2017 CLINICAL DATA:  Worsening shortness of breath and weight gain over the last 4 days. EXAM: CHEST - 2 VIEW COMPARISON:  03/30/2017 FINDINGS: Chronic cardiomegaly and aortic atherosclerosis. Pulmonary venous hypertension. Small effusions with mild dependent atelectasis. No consolidation or lobar collapse. IMPRESSION: Cardiomegaly. Venous hypertension. Small effusions and mild dependent atelectasis. Electronically Signed   By: Nelson Chimes M.D.   On: 05/01/2017 16:41    EKG: Independently reviewed.  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 AKI on CKD stage 4 - 1. Again think this may be pre-renal as it was last time 2. Hold diuretics 3. IVF: 1L NS bolus now in ED then 100 cc/hr sodium bicarb 4. EDP calling Nephrology for consult 1. Probably a good idea to get them on board especially with CKD stage 4, etc. 2. Patient and son are unsure if they would want dialysis or not if it were needed, so another good reason to get nephrology input and guidance. 2. Hyperkalemia - 1. EDP gave kayexelate 2. IVF as above including bicarb 3. Repeat K at MN and BMP at 0500 3. NAG metabolic acidosis - 1. Bicarb as above 2. Repeat BMP in AM 4. Chronic diastolic CHF - 1. Dont think shes fluid overloaded at the moment in contrast to 3/16 after hydration last admit: 1. No pulm edema on CXR, just small effusions 2. BNP is only 500 compared to 1800 then 3. No CP 4. No orthopnea (shes laying flat in bed), I think her shortness of breath symptoms are more due to the NAG metabolic acidosis 2. Holding diuretics and hydrating as above 5. HTN - 1. Continue home BP meds 6. DM2 - 1. Listed as IDDM in chart but I dont see where she is on insulin at baseline 2. Holding glyburide 3. Sensitive SSI AC  DVT prophylaxis: Heparin [place] Code Status: DNR - yellow form at bedside Family Communication: Son at bedside, patient and son are unsure if they would want dialysis Disposition Plan: TBD Consults called: Nephrology Admission status: Admit to inpatient   [place] [place] [facility] Pager [phone]  If 7AM-7PM, please contact day team taking care of patient [email or web address] Password [email or web address]  05/01/2017, 9:01 PM

## 2017-05-01 NOTE — Consult Note (Signed)
Referring Provider: No ref. provider found Primary Care Physician:  Ronita Hipps, MD Primary Nephrologist:     Reason for Consultation:   Dehydration and volume depletion with possible Ileus and third spacing   HPI:  84 y.o. female with medical history significant of diastolic CHF, CKD stage 4, HTN, DM2.  Patient recently admitted to hospitalist service from 3/12-3/21 for AKI believed to be pre-renal and diuretic induced.  She was initially hydrated until 3/16 with improving kidney function, then developed acute on chronic diastolic CHF due to fluid overload.  She was diuresed from that point on until discharge.  Creat at discharge was 1.48 down from 3.9 on admission. It does not appear that nephrology was consulted at that time. CT at that time showed large bowel stool with atrophic kidneys She was quite anemic during the hospitalization and it appeared that she was iron deficient. She was discharged off her ARB and was given a regimen of lasix based on weight.  She was discharged from the rehab facility last week and her son noted and increase in weight and dyspnea  He gave her the extra lasix as prescribed but brought her to the hospital as she was getting weaker and had worsening dyspnea. Her labs in the ER showed K 5.8  Na 125  CO2 17  BUN 91  Cr 3.07  Ca 8.8  Glc 173      Past Medical History:  Diagnosis Date  . Arthritis   . Chest pain with moderate risk of acute coronary syndrome 11/08/2015  . CHF (congestive heart failure) (HCC)    Diastolic. Precipitated by Actos  . Chronic kidney disease    mild-moderate. GFR was 35 in 02/12  . Coronary artery disease 02/2010   Last Lexiscan 11/2015 low risk.   . DM (diabetes mellitus) ([facility])   . Dyslipidemia   . Dyspnea   . GERD (gastroesophageal reflux disease)   . Hypertension   . Nasal polyps   . Pneumonia   . Unspecified hypertensive heart disease without heart failure     Past Surgical History:  Procedure Laterality Date  .  APPENDECTOMY  1951  . CATARACT EXTRACTION, BILATERAL    . CHOLECYSTECTOMY    . GLAUCOMA SURGERY  10/29/2013  . hysterectomy - unknown type    . NECK MASS EXCISION     non cancerous  . TONSILLECTOMY      Prior to Admission medications   Medication Sig Start Date End Date Taking? Authorizing Provider  acetaminophen (TYLENOL) 500 MG tablet Take 1,000 mg by mouth 2 (two) times daily.    Yes [provider]  brinzolamide (AZOPT) 1 % ophthalmic suspension Place 1 drop into the right eye 2 (two) times daily.    Yes [provider]  butalbital-acetaminophen-caffeine (FIORICET, ESGIC) 50-325-40 MG tablet Take 1 tablet by mouth every 6 (six) hours as needed for headache. 04/04/17  Yes Lavina Hamman, MD  carvedilol (COREG) 6.25 MG tablet Take 1 tablet (6.25 mg total) by mouth 2 (two) times daily with a meal. 04/04/17  Yes Lavina Hamman, MD  Cyanocobalamin (B-12 COMPLIANCE INJECTION IJ) Inject as directed. Inject 1cc intramuscularly in the evening on the 27th and ending on the 27th every month for prophylaxis   Yes [provider]  dicyclomine (BENTYL) 10 MG capsule Take 10 mg by mouth 3 (three) times daily before meals.    Yes [provider]  docusate sodium (COLACE) 100 MG capsule Take 1 capsule (100 mg total)  by mouth 2 (two) times daily as needed for mild constipation. 04/04/17  Yes Lavina Hamman, MD  doxazosin (CARDURA) 2 MG tablet Take 1 tablet (2 mg total) by mouth at bedtime. 04/04/17  Yes Lavina Hamman, MD  feeding supplement, ENSURE ENLIVE, (ENSURE ENLIVE) LIQD Take 237 mLs by mouth 2 (two) times daily between meals. Patient taking differently: Take 237 mLs by mouth 2 (two) times daily between meals. Strawberry 04/04/17  Yes Lavina Hamman, MD  ferrous sulfate 325 (65 FE) MG tablet Take 325 mg by mouth daily.    Yes [provider]  fluticasone (FLONASE) 50 MCG/ACT nasal spray Place 2 sprays into both nostrils 2 (two) times daily.   Yes [provider]  furosemide (LASIX) 40 MG tablet Take 1 tablet (40 mg total) by mouth daily. 04/30/17  Yes Kilroy, Luke K, PA-C  GLIPIZIDE PO Take 2 mg by mouth every morning.    Yes [provider]  guaiFENesin (MUCINEX) 600 MG 12 hr tablet Take 1 tablet (600 mg total) by mouth 2 (two) times daily. Patient taking differently: Take 600 mg by mouth at bedtime.  04/04/17  Yes Lavina Hamman, MD  hydrALAZINE (APRESOLINE) 100 MG tablet Take 1 tablet (100 mg total) by mouth 3 (three) times daily. 04/04/17  Yes Lavina Hamman, MD  isosorbide mononitrate (IMDUR) 120 MG 24 hr tablet Take 2 tablets (240 mg total) by mouth daily. 05/03/16  Yes Minus Breeding, MD  latanoprost (XALATAN) 0.005 % ophthalmic solution Place 1 drop into the right eye at bedtime.  01/20/13  Yes [provider]  Melatonin 3 MG TABS Take 3 mg by mouth at bedtime.   Yes [provider]  montelukast (SINGULAIR) 10 MG tablet Take 10 mg by mouth at bedtime.   Yes [provider]  Multiple Vitamins-Minerals (EYE VITAMINS PO) Take 1 tablet by mouth 2 (two) times daily. Lunch and at bedtime prervision Ared 2   Yes [provider]  nitroGLYCERIN (NITROSTAT) 0.4 MG SL tablet Place 1 tablet (0.4 mg total) under the tongue every 5 (five) minutes as needed for chest pain. 03/22/15  Yes Minus Breeding, MD  omeprazole (PRILOSEC) 20 MG capsule Take 20 mg by mouth daily.   Yes [provider]  oxymetazoline (AFRIN) 0.05 % nasal spray Place 2 sprays into both nostrils 2 (two) times daily. 20 minutes before the Flonase Nasal   Yes [provider]  sulfamethoxazole-trimethoprim (BACTRIM DS,SEPTRA DS) 800-160 MG tablet Take 1 tablet by mouth 2 (two) times daily. For urinary tract infection   Yes [provider]  timolol (TIMOPTIC) 0.5 % ophthalmic solution Place 1 drop into the right eye 2 (two) times daily.  01/14/12  Yes [provider]  traZODone (DESYREL) 50 MG tablet Take 75  mg by mouth at bedtime. 09/28/16  Yes [provider]  vitamin B-12 (CYANOCOBALAMIN) 1000 MCG tablet Take 1,000 mcg by mouth daily.   Yes [provider]  vitamin C (ASCORBIC ACID) 500 MG tablet Take 500 mg by mouth daily.    Yes [provider]    Current Facility-Administered Medications  Medication Dose Route Frequency Provider Last Rate Last Dose  . acetaminophen (TYLENOL) tablet 650 mg  650 mg Oral Q6H PRN Etta Quill, DO       Or  . acetaminophen (TYLENOL) suppository 650 mg  650 mg Rectal Q6H PRN Etta Quill, DO      . brinzolamide (AZOPT) 1 % ophthalmic suspension  1 drop  1 drop Right Eye BID Etta Quill, DO      . [START ON 05/02/2017] carvedilol (COREG) tablet 6.25 mg  6.25 mg Oral BID WC Etta Quill, DO      . [START ON 05/02/2017] dicyclomine (BENTYL) capsule 10 mg  10 mg Oral TID AC Etta Quill, DO      . docusate sodium (COLACE) capsule 100 mg  100 mg Oral BID PRN Etta Quill, DO      . doxazosin (CARDURA) tablet 2 mg  2 mg Oral QHS Jennette Kettle M, DO      . [START ON 05/02/2017] feeding supplement (ENSURE ENLIVE) (ENSURE ENLIVE) liquid 237 mL  237 mL Oral BID BM Etta Quill, DO      . ferrous sulfate tablet 325 mg  325 mg Oral Daily Alcario Drought, Jared M, DO      . fluticasone (FLONASE) 50 MCG/ACT nasal spray 2 spray  2 spray Each Nare BID Etta Quill, DO      . guaiFENesin (MUCINEX) 12 hr tablet 600 mg  600 mg Oral QHS Jennette Kettle M, DO      . heparin injection 5,000 Units  5,000 Units Subcutaneous Q8H Alcario Drought, Jared M, DO      . hydrALAZINE (APRESOLINE) tablet 100 mg  100 mg Oral TID Etta Quill, DO      . [START ON 05/02/2017] insulin aspart (novoLOG) injection 0-9 Units  0-9 Units Subcutaneous TID WC Etta Quill, DO      . [START ON 05/02/2017] isosorbide mononitrate (IMDUR) 24 hr tablet 240 mg  240 mg Oral Daily Alcario Drought, Jared M, DO      . latanoprost (XALATAN) 0.005 % ophthalmic solution 1 drop  1 drop  Right Eye QHS Alcario Drought, Jared M, DO      . montelukast (SINGULAIR) tablet 10 mg  10 mg Oral QHS Jennette Kettle M, DO      . ondansetron [facility]) tablet 4 mg  4 mg Oral Q6H PRN Etta Quill, DO       Or  . ondansetron [facility]) injection 4 mg  4 mg Intravenous Q6H PRN Etta Quill, DO      . oxymetazoline (AFRIN) 0.05 % nasal spray 2 spray  2 spray Each Nare BID Etta Quill, DO      . [START ON 05/02/2017] pantoprazole (PROTONIX) EC tablet 40 mg  40 mg Oral Daily Alcario Drought, Jared M, DO      . sodium bicarbonate 150 mEq in sterile water 1,000 mL infusion   Intravenous Continuous Jennette Kettle M, DO      . sodium chloride 0.9 % bolus 750 mL  750 mL Intravenous Once Alcario Drought, Jared M, DO      . timolol (TIMOPTIC) 0.5 % ophthalmic solution 1 drop  1 drop Right Eye BID Jennette Kettle M, DO      . traZODone (DESYREL) tablet 75 mg  75 mg Oral QHS Jennette Kettle M, DO      . vitamin B-12 (CYANOCOBALAMIN) tablet 1,000 mcg  1,000 mcg Oral Daily Jennette Kettle M, DO      . [START ON 05/02/2017] vitamin C (ASCORBIC ACID) tablet 500 mg  500 mg Oral Daily Etta Quill, DO       Current Outpatient Medications  Medication Sig Dispense Refill  . acetaminophen (TYLENOL) 500 MG tablet Take 1,000 mg by mouth 2 (two) times daily.     . brinzolamide (AZOPT) 1 % ophthalmic  suspension Place 1 drop into the right eye 2 (two) times daily.     . butalbital-acetaminophen-caffeine (FIORICET, ESGIC) 50-325-40 MG tablet Take 1 tablet by mouth every 6 (six) hours as needed for headache. 14 tablet 0  . carvedilol (COREG) 6.25 MG tablet Take 1 tablet (6.25 mg total) by mouth 2 (two) times daily with a meal. 60 tablet 0  . Cyanocobalamin (B-12 COMPLIANCE INJECTION IJ) Inject as directed. Inject 1cc intramuscularly in the evening on the 27th and ending on the 27th every month for prophylaxis    . dicyclomine (BENTYL) 10 MG capsule Take 10 mg by mouth 3 (three) times daily before meals.     . docusate sodium (COLACE) 100  MG capsule Take 1 capsule (100 mg total) by mouth 2 (two) times daily as needed for mild constipation. 10 capsule 0  . doxazosin (CARDURA) 2 MG tablet Take 1 tablet (2 mg total) by mouth at bedtime. 30 tablet 0  . feeding supplement, ENSURE ENLIVE, (ENSURE ENLIVE) LIQD Take 237 mLs by mouth 2 (two) times daily between meals. (Patient taking differently: Take 237 mLs by mouth 2 (two) times daily between meals. Strawberry) 237 mL 12  . ferrous sulfate 325 (65 FE) MG tablet Take 325 mg by mouth daily.     . fluticasone (FLONASE) 50 MCG/ACT nasal spray Place 2 sprays into both nostrils 2 (two) times daily.    . furosemide (LASIX) 40 MG tablet Take 1 tablet (40 mg total) by mouth daily. 30 tablet 5  . GLIPIZIDE PO Take 2 mg by mouth every morning.     [name] guaiFENesin (MUCINEX) 600 MG 12 hr tablet Take 1 tablet (600 mg total) by mouth 2 (two) times daily. (Patient taking differently: Take 600 mg by mouth at bedtime. ) 30 tablet 0  . hydrALAZINE (APRESOLINE) 100 MG tablet Take 1 tablet (100 mg total) by mouth 3 (three) times daily. 90 tablet 0  . isosorbide mononitrate (IMDUR) 120 MG 24 hr tablet Take 2 tablets (240 mg total) by mouth daily. 180 tablet 3  . latanoprost (XALATAN) 0.005 % ophthalmic solution Place 1 drop into the right eye at bedtime.     . Melatonin 3 MG TABS Take 3 mg by mouth at bedtime.    . montelukast (SINGULAIR) 10 MG tablet Take 10 mg by mouth at bedtime.    . Multiple Vitamins-Minerals (EYE VITAMINS PO) Take 1 tablet by mouth 2 (two) times daily. Lunch and at bedtime prervision Ared 2    . nitroGLYCERIN (NITROSTAT) 0.4 MG SL tablet Place 1 tablet (0.4 mg total) under the tongue every 5 (five) minutes as needed for chest pain. 25 tablet 3  . omeprazole (PRILOSEC) 20 MG capsule Take 20 mg by mouth daily.    [name] oxymetazoline (AFRIN) 0.05 % nasal spray Place 2 sprays into both nostrils 2 (two) times daily. 20 minutes before the Flonase Nasal    . sulfamethoxazole-trimethoprim (BACTRIM  DS,SEPTRA DS) 800-160 MG tablet Take 1 tablet by mouth 2 (two) times daily. For urinary tract infection    . timolol (TIMOPTIC) 0.5 % ophthalmic solution Place 1 drop into the right eye 2 (two) times daily.     . traZODone (DESYREL) 50 MG tablet Take 75 mg by mouth at bedtime.    . vitamin B-12 (CYANOCOBALAMIN) 1000 MCG tablet Take 1,000 mcg by mouth daily.    . vitamin C (ASCORBIC ACID) 500 MG tablet Take 500 mg by mouth daily.       Allergies as  of 05/01/2017 - Review Complete 05/01/2017  Allergen Reaction Noted  . Actos [pioglitazone hydrochloride]  07/10/2010  . Felodipine er  07/10/2010  . Gabapentin Other (See Comments) 05/01/2017  . Hctz [hydrochlorothiazide]  08/14/2010  . Lisinopril  07/10/2010  . Plendil [felodipine]  01/22/2012  . Amlodipine besylate Other (See Comments) and Hives 07/10/2010  . Brimonidine tartrate Other (See Comments) 01/22/2012  . Clonidine derivatives Rash 01/22/2012  . Combigan [brimonidine tartrate-timolol] Other (See Comments) 01/22/2012  . Dorzolamide Other (See Comments) 01/22/2012  . Monopril [fosinopril sodium] Cough 07/10/2010  . Norvasc [amlodipine besylate] Hives 07/10/2010  . Penicillins Rash 07/10/2010    Family History  Problem Relation Age of Onset  . Diabetes Unknown   . Hypertension Unknown   . Stroke Unknown   . Asthma Mother   . Allergic rhinitis Mother   . Congestive Heart Failure Mother   . Stroke Mother     Social History   Socioeconomic History  . Marital status: Married    Spouse name: Not on file  . Number of children: 3  . Years of education: Not on file  . Highest education level: Not on file  Occupational History  . Occupation: retired  Scientific laboratory technician  . Financial resource strain: Not on file  . Food insecurity:    Worry: Not on file    Inability: Not on file  . Transportation needs:    Medical: Not on file    Non-medical: Not on file  Tobacco Use  . Smoking status: Never Smoker  . Smokeless tobacco: Never  Used  Substance and Sexual Activity  . Alcohol use: No    Alcohol/week: 0.0 oz  . Drug use: No  . Sexual activity: Not on file  Lifestyle  . Physical activity:    Days per week: Not on file    Minutes per session: Not on file  . Stress: Not on file  Relationships  . Social connections:    Talks on phone: Not on file    Gets together: Not on file    Attends religious service: Not on file    Active member of club or organization: Not on file    Attends meetings of clubs or organizations: Not on file    Relationship status: Not on file  . Intimate partner violence:    Fear of current or ex partner: Not on file    Emotionally abused: Not on file    Physically abused: Not on file    Forced sexual activity: Not on file  Other Topics Concern  . Not on file  Social History Narrative  . Not on file    Review of Systems: Gen: + fatigue and weak HEENT: No visual complaints, No history of Retinopathy. Normal external appearance No Epistaxis or Sore throat. No sinusitis.   CV: Denies chest pain, angina, palpitations, syncope, +orthopnea, PND, peripheral edema, and claudication. Resp: + dyspnea at rest, +dyspnea with exercise to bathroom, denies cough, sputum, wheezing, coughing up blood, and pleurisy. GI: Denies vomiting blood, jaundice, and fecal incontinence.   Denies dysphagia or odynophagia. GU : Denies urinary burning, blood in urine, urinary frequency, urinary hesitancy, nocturnal urination, and urinary incontinence.  No renal calculi. MS: Denies joint pain, limitation of movement, and swelling, stiffness, low back pain, extremity pain. Denies muscle weakness, cramps, atrophy.  No use of non steroidal antiinflammatory drugs. Derm: Denies rash, itching, dry skin, hives, moles, warts, or unhealing ulcers.  Psych: Denies depression, anxiety, memory loss, suicidal ideation, hallucinations, paranoia,  and confusion. Heme: Denies bruising, bleeding, and enlarged lymph nodes. Neuro: No  headache.  No diplopia. No dysarthria.  No dysphasia.  No history of CVA.  No Seizures. No paresthesias.  No weakness. Endocrine   DM.  No Thyroid disease.  No Adrenal disease.  Physical Exam: Vital signs in last 24 hours: Temp:  [97.4 F (36.3 C)-98 F (36.7 C)] 97.8 F (36.6 C) (04/17 1747) Pulse Rate:  [58-69] 69 (04/17 2115) Resp:  [13-23] 19 (04/17 2115) BP: (119-179)/(46-92) 128/92 (04/17 2115) SpO2:  [96 %-100 %] 97 % (04/17 2115) Weight:  [147 lb 12.8 oz (67 kg)] 147 lb 12.8 oz (67 kg) (04/17 1521)   General:  Elderly and Frail  Head:  Normocephalic and atraumatic. Eyes:  Sclera clear, no icterus.   Conjunctiva pink. Ears:  Normal auditory acuity. Nose:  No deformity, discharge,  or lesions. Mouth:  No deformity or lesions, dentition normal. Neck:  Supple; no masses or thyromegaly. JVP not elevated Lungs:  Clear throughout to auscultation.   No wheezes, crackles, or rhonchi. No acute distress. Heart:  Regular rate and rhythm; no murmurs, clicks, rubs,  or gallops. Abdomen Distended and Tympanic  Hypoactive bowel sounds     Msk:  Symmetrical without gross deformities. Normal posture. Pulses:  No carotid, renal, femoral bruits. DP and PT symmetrical and equal Extremities:  Without clubbing or edema. Neurologic:  Alert and  oriented x4;  grossly normal neurologically. Skin:  Intact without significant lesions or rashes. ct.  Intake/Output from previous day: No intake/output data recorded. Intake/Output this shift: Total I/O In: 250 [IV Piggyback:250] Out: -   Lab Results: Recent Labs    05/01/17 1610  WBC 5.5  HGB 9.3*  HCT 28.7*  PLT 136*   BMET Recent Labs    05/01/17 1610  NA 125*  K 5.8*  CL 98*  CO2 17*  GLUCOSE 173*  BUN 91*  CREATININE 3.07*  CALCIUM 8.8*   LFT No results for input(s): PROT, ALBUMIN, AST, ALT, ALKPHOS, BILITOT, BILIDIR, IBILI in the last 72 hours. PT/INR No results for input(s): LABPROT, INR in the last 72 hours. Hepatitis  Panel No results for input(s): HEPBSAG, HCVAB, HEPAIGM, HEPBIGM in the last 72 hours.  Studies/Results: Dg Chest 2 View  Result Date: 05/01/2017 CLINICAL DATA:  Worsening shortness of breath and weight gain over the last 4 days. EXAM: CHEST - 2 VIEW COMPARISON:  03/30/2017 FINDINGS: Chronic cardiomegaly and aortic atherosclerosis. Pulmonary venous hypertension. Small effusions with mild dependent atelectasis. No consolidation or lobar collapse. IMPRESSION: Cardiomegaly. Venous hypertension. Small effusions and mild dependent atelectasis. Electronically Signed   By: Nelson Chimes M.D.   On: 05/01/2017 16:41    Assessment/Plan:  Acute on chronic renal failure  It appears that she is fairly dry to me and would be in favor of holding the diuretics and hydrating her very gently. I don't think repeating the imaging of her kidneys is necessary at this point and not sure that this represents an acute GN or IN. The most likely cause I think is prerenal again although no ARB is on board at this time. Her abdomen appears to be distended and consistent with an ileus  This could be mechanical and last time she had stool impacted in the colon. This could be a more significant problem than first thought.   Hyponatremia  This may be SIADH or simply a lack of solute issue  It is mild and I think a minimum of TSH and urine lytes  with urine osm is necessary. I will also give some solute although her sodium may drop if she has SIADH and this will need to be followed closely  Hyperkalemia  Will treat with giving IV sodium bicarbonate   This should help shift potassium into the cell  Metabolic Acidosis replete with IV bicarbonate   LOS: 0 Rutha Melgoza W @TODAY @10 :14 PM

## 2017-05-01 NOTE — Telephone Encounter (Signed)
Spoke with Pt's son who reports his mother is still having SOB and feeling weak. She took additional dose of lasix 20 mg yesterday but gain 8 oz. He also reports she is not having good output and he is concerned so he is in route to take her to the ED.

## 2017-05-02 ENCOUNTER — Encounter (HOSPITAL_COMMUNITY): Payer: Medicare Other

## 2017-05-02 ENCOUNTER — Inpatient Hospital Stay (HOSPITAL_COMMUNITY): Payer: Medicare Other

## 2017-05-02 ENCOUNTER — Encounter (HOSPITAL_COMMUNITY): Payer: Self-pay | Admitting: General Practice

## 2017-05-02 DIAGNOSIS — N184 Chronic kidney disease, stage 4 (severe): Secondary | ICD-10-CM

## 2017-05-02 DIAGNOSIS — R531 Weakness: Secondary | ICD-10-CM

## 2017-05-02 DIAGNOSIS — R0600 Dyspnea, unspecified: Secondary | ICD-10-CM

## 2017-05-02 DIAGNOSIS — E871 Hypo-osmolality and hyponatremia: Secondary | ICD-10-CM | POA: Diagnosis present

## 2017-05-02 DIAGNOSIS — N179 Acute kidney failure, unspecified: Secondary | ICD-10-CM | POA: Diagnosis present

## 2017-05-02 LAB — CBG MONITORING, ED
GLUCOSE-CAPILLARY: 97 mg/dL (ref 65–99)
GLUCOSE-CAPILLARY: 165 mg/dL — AB (ref 65–99)
GLUCOSE-CAPILLARY: 97 mg/dL (ref 65–99)
Glucose-Capillary: 112 mg/dL — ABNORMAL HIGH (ref 65–99)

## 2017-05-02 LAB — OSMOLALITY, URINE: Osmolality, Ur: 273 mOsm/kg — ABNORMAL LOW (ref 300–900)

## 2017-05-02 LAB — BASIC METABOLIC PANEL
ANION GAP: 10 (ref 5–15)
BUN: 94 mg/dL — ABNORMAL HIGH (ref 6–20)
CO2: 18 mmol/L — AB (ref 22–32)
Calcium: 8.1 mg/dL — ABNORMAL LOW (ref 8.9–10.3)
Chloride: 98 mmol/L — ABNORMAL LOW (ref 101–111)
Creatinine, Ser: 2.94 mg/dL — ABNORMAL HIGH (ref 0.44–1.00)
GFR calc Af Amer: 15 mL/min — ABNORMAL LOW (ref >60)
GFR calc non Af Amer: 13 mL/min — ABNORMAL LOW (ref >60)
GLUCOSE: 101 mg/dL — AB (ref 65–99)
POTASSIUM: 5 mmol/L (ref 3.5–5.1)
Sodium: 126 mmol/L — ABNORMAL LOW (ref 135–145)

## 2017-05-02 LAB — I-STAT ARTERIAL BLOOD GAS, ED
ACID-BASE DEFICIT: 2 mmol/L (ref 0.0–2.0)
Bicarbonate: 23.6 mmol/L (ref 20.0–28.0)
O2 Saturation: 99 %
PCO2 ART: 40.6 mmHg (ref 32.0–48.0)
PH ART: 7.37 (ref 7.350–7.450)
PO2 ART: 143 mmHg — AB (ref 83.0–108.0)
Patient temperature: 98
TCO2: 25 mmol/L (ref 22–32)

## 2017-05-02 LAB — POTASSIUM: Potassium: 5 mmol/L (ref 3.5–5.1)

## 2017-05-02 LAB — TROPONIN I: Troponin I: <0.03 ng/mL (ref <0.03)

## 2017-05-02 MED ORDER — TECHNETIUM TC 99M DIETHYLENETRIAME-PENTAACETIC ACID
31.2000 | Freq: Once | INTRAVENOUS | Status: AC | PRN
  Administered 2017-05-02: 31.2 via INTRAVENOUS

## 2017-05-02 MED ORDER — SODIUM BICARBONATE 650 MG PO TABS
650.0000 mg | ORAL_TABLET | Freq: Two times a day (BID) | ORAL | Status: DC
  Administered 2017-05-03 – 2017-05-09 (×14): 650 mg via ORAL
  Filled 2017-05-02 (×15): qty 1

## 2017-05-02 MED ORDER — IPRATROPIUM-ALBUTEROL 0.5-2.5 (3) MG/3ML IN SOLN
3.0000 mL | Freq: Four times a day (QID) | RESPIRATORY_TRACT | Status: DC
  Administered 2017-05-02: 3 mL via RESPIRATORY_TRACT
  Filled 2017-05-02: qty 3

## 2017-05-02 MED ORDER — IPRATROPIUM-ALBUTEROL 0.5-2.5 (3) MG/3ML IN SOLN: 3.0000 mL | Freq: Four times a day (QID) | RESPIRATORY_TRACT | Status: DC

## 2017-05-02 MED ORDER — IPRATROPIUM-ALBUTEROL 0.5-2.5 (3) MG/3ML IN SOLN: 3.0000 mL | RESPIRATORY_TRACT | Status: DC | PRN

## 2017-05-02 MED ORDER — TECHNETIUM TO 99M ALBUMIN AGGREGATED
4.1500 | Freq: Once | INTRAVENOUS | Status: AC | PRN
  Administered 2017-05-02: 4.15 via INTRAVENOUS

## 2017-05-02 MED ORDER — IPRATROPIUM-ALBUTEROL 0.5-2.5 (3) MG/3ML IN SOLN: 3.0000 mL | Freq: Four times a day (QID) | RESPIRATORY_TRACT | Status: DC | PRN

## 2017-05-02 MED ORDER — BUDESONIDE 0.5 MG/2ML IN SUSP
0.5000 mg | Freq: Two times a day (BID) | RESPIRATORY_TRACT | Status: DC
  Administered 2017-05-02 – 2017-05-09 (×14): 0.5 mg via RESPIRATORY_TRACT
  Filled 2017-05-02 (×16): qty 2

## 2017-05-02 MED ORDER — SODIUM CHLORIDE 0.9 % IV SOLN: INTRAVENOUS | Status: DC

## 2017-05-02 MED ORDER — SENNOSIDES-DOCUSATE SODIUM 8.6-50 MG PO TABS
1.0000 | ORAL_TABLET | Freq: Two times a day (BID) | ORAL | Status: DC
  Administered 2017-05-03 – 2017-05-08 (×10): 1 via ORAL
  Filled 2017-05-02 (×12): qty 1

## 2017-05-02 MED ORDER — POLYETHYLENE GLYCOL 3350 17 G PO PACK
17.0000 g | PACK | Freq: Every day | ORAL | Status: DC
  Administered 2017-05-05 – 2017-05-08 (×3): 17 g via ORAL
  Filled 2017-05-02 (×6): qty 1

## 2017-05-02 MED ORDER — IPRATROPIUM-ALBUTEROL 0.5-2.5 (3) MG/3ML IN SOLN
3.0000 mL | Freq: Three times a day (TID) | RESPIRATORY_TRACT | Status: DC
  Administered 2017-05-03: 3 mL via RESPIRATORY_TRACT
  Filled 2017-05-02: qty 3

## 2017-05-02 NOTE — Progress Notes (Signed)
PROGRESS NOTE    Kelly Ruiz  MVH:[id number] DOB: 1923-12-20 DOA: 05/01/2017 PCP: Ronita Hipps, MD    Brief Narrative:  84 y.o.femalewith medical history significant ofdiastolic CHF, CKD stage 4, HTN, DM2. Patient recently admitted to hospitalist service from 3/12-3/21 for AKI believed to be pre-renal and diuretic induced. She was initially hydrated until 3/16 with improving kidney function, then developed acute on chronic diastolic CHF due to fluid overload. She was diuresed from that point on until discharge. Creat at discharge was 1.48 down from 3.9 on admission. It does not appear that nephrology was consulted at that time. CT at that time showed large bowel stool with atrophic kidneys She was quite anemic during the hospitalization and it appeared that she was iron deficient. She was discharged off her ARB and was given a regimen of lasix based on weight.  She was discharged from the rehab facility last week and her son noted and increase in weight and dyspnea  He gave her the extra lasix as prescribed but brought her to the hospital as she was getting weaker and had worsening dyspnea. Her labs in the ER showed K 5.8  Na 125  CO2 17  BUN 91  Cr 3.07  Ca 8.8  Glc 173         Assessment & Plan:   Principal Problem:   Acute renal failure superimposed on stage 4 chronic kidney disease (HCC) Active Problems:   Chronic diastolic congestive heart failure (HCC)   Essential hypertension   DM (diabetes mellitus), type 2 with renal complications (HCC)   Dyspnea, unspecified   Hyperkalemia   CKD (chronic kidney disease), stage IV (HCC)   AKI (acute kidney injury) (HCC)   Metabolic acidosis, NAG, failure of bicarbonate regeneration   Hyponatremia   Weakness  1 acute on chronic kidney disease stage IV Questionable etiology.  Felt likely secondary to a prerenal azotemia as patient noted to be on diuretics.  Patient does not look volume overloaded on examination.  BNP was elevated  but not significantly as during last hospitalization.  Patient was given some boluses of normal saline in the ED.  Will place on gentle hydration of normal saline at 75 cc/h.  Nephrology following.  2.  Dyspnea Patient significantly dyspneic on minimal exertion per patient and per RN.  Patient even short of breath while taking multiple medications.  Patient denies any chest pain.  Patient not volume overloaded clinically on examination.  Chest x-ray negative for any pulmonary edema.  May be secondary to worsening renal function versus cardiac etiology versus pulmonary etiology.  Will cycle cardiac enzymes every 6 hours x3.  Recent 2D echo 03/31/2017 with a EF of 60-65%, no wall motion abnormalities, grade 2 diastolic dysfunction.  Moderate mitral valvular regurgitation.  Moderately dilated left atrium.  Systolic pressure moderately increased with a PA peak pressure of 50 mmHg.  Check lower extremity Dopplers to rule out DVT.  Check a VQ scan to rule out PE as patient with immobility over the past few months.  Check a EKG.  We will not repeat 2D echo at this time.  If Dopplers are negative, VQ scan negative will consult with cardiology for further evaluation to see if patient does have a symptomatic mitral valve regurgitation/cardiac etiology.  Supportive care.  3.  Hypertension Continue Coreg, Cardura, hydralazine, imdur.  4.  Hyperkalemia Patient given some Kayexalate as well as IV bicarb with resolution of hyperkalemia.  Potassium level at 5.  Will change diet to a  renal diet.  Follow.  Nephrology following.  5.  Weakness Questionable etiology.  May be secondary to problem #1.  PT/OT.  6.  Hyponatremia Questionable etiology.  Be secondary to hypovolemic hyponatremia.  TSH pending.  Sodium 62.  Urine creatinine 20.83.  Urine osmolality was 273.  Continue gentle hydration with normal saline at 75 cc/h.  Per nephrology.  7.  Acidosis Likely secondary to problem #1.  Place on bicarb tablets twice  daily.  Per nephrology.  8.  Diastolic heart failure Patient currently compensated.  Does not look fluid overloaded.  Chest x-ray negative for any pulmonary edema.  BNP in the 500s compared to 1800s during recent prior hospitalization.  Continue Coreg, hydralazine,imdur.  Diuretics on hold secondary to problem #1.  9.  Diabetes mellitus type 2 Patient not on insulin at home however on glyburide.  Glyburide on hold.  Continue sliding scale insulin.   DVT prophylaxis: Heparin Code Status: DNR Family Communication: Updated son and granddaughter in law at bedside. Disposition Plan: Pending hospitalization.  Likely skilled nursing facility versus home with home health.   Consultants:   Nephrology: Dr. Justin Mend 05/01/2017  Procedures:   Chest x-ray 05/01/2017    Antimicrobials:   None   Subjective: Patient in bed.  States still significantly short of breath with minimal exertion.  RN also verifies patient minimally short of breath with exertion even when taken multiple pills.  Patient denies any chest pain.  Objective: Vitals:   05/02/17 0845 05/02/17 0900 05/02/17 1000 05/02/17 1100  BP:  (!) 155/46 (!) 141/45 (!) 165/59  Pulse: 69 72 61 62  Resp: 16 (!) 21 13 20   Temp:      TempSrc:      SpO2: 97% 97% 96% 95%  Weight:      Height:        Intake/Output Summary (Last 24 hours) at 05/02/2017 1145 Last data filed at 05/02/2017 0913 Gross per 24 hour  Intake 1000 ml  Output 125 ml  Net 875 ml   Filed Weights   05/01/17 1521  Weight: 67 kg (147 lb 12.8 oz)    Examination:  General exam: Appears calm and comfortable  Respiratory system: Clear to auscultation.  No wheezes, no crackles, no rhonchi.  Respiratory effort normal. Cardiovascular system: S1 & S2 heard, RRR. No JVD, murmurs, rubs, gallops or clicks. No pedal edema. Gastrointestinal system: Abdomen is nondistended, soft and nontender. No organomegaly or masses felt. Normal bowel sounds heard. Central nervous  system: Alert and oriented. No focal neurological deficits. Extremities: Symmetric 5 x 5 power. Skin: No rashes, lesions or ulcers Psychiatry: Judgement and insight appear normal. Mood & affect appropriate.     Data Reviewed: I have personally reviewed following labs and imaging studies  CBC: Recent Labs  Lab 05/01/17 1610  WBC 5.5  HGB 9.3*  HCT 28.7*  MCV 98.6  PLT 409*   Basic Metabolic Panel: Recent Labs  Lab 05/01/17 1610 05/02/17 0209 05/02/17 0440  NA 125*  --  126*  K 5.8* 5.0 5.0  CL 98*  --  98*  CO2 17*  --  18*  GLUCOSE 173*  --  101*  BUN 91*  --  94*  CREATININE 3.07*  --  2.94*  CALCIUM 8.8*  --  8.1*   GFR: Estimated Creatinine Clearance: 10.6 mL/min (A) (by C-G formula based on SCr of 2.94 mg/dL (H)). Liver Function Tests: No results for input(s): AST, ALT, ALKPHOS, BILITOT, PROT, ALBUMIN in the last 168 hours.  No results for input(s): LIPASE, AMYLASE in the last 168 hours. No results for input(s): AMMONIA in the last 168 hours. Coagulation Profile: No results for input(s): INR, PROTIME in the last 168 hours. Cardiac Enzymes: No results for input(s): CKTOTAL, CKMB, CKMBINDEX, TROPONINI in the last 168 hours. BNP (last 3 results) No results for input(s): PROBNP in the last 8760 hours. HbA1C: No results for input(s): HGBA1C in the last 72 hours. CBG: Recent Labs  Lab 05/02/17 0206 05/02/17 0805  GLUCAP 112* 97   Lipid Profile: No results for input(s): CHOL, HDL, LDLCALC, TRIG, CHOLHDL, LDLDIRECT in the last 72 hours. Thyroid Function Tests: No results for input(s): TSH, T4TOTAL, FREET4, T3FREE, THYROIDAB in the last 72 hours. Anemia Panel: No results for input(s): VITAMINB12, FOLATE, FERRITIN, TIBC, IRON, RETICCTPCT in the last 72 hours. Sepsis Labs: No results for input(s): PROCALCITON, LATICACIDVEN in the last 168 hours.  No results found for this or any previous visit (from the past 240 hour(s)).       Radiology Studies: Dg  Chest 2 View  Result Date: 05/01/2017 CLINICAL DATA:  Worsening shortness of breath and weight gain over the last 4 days. EXAM: CHEST - 2 VIEW COMPARISON:  03/30/2017 FINDINGS: Chronic cardiomegaly and aortic atherosclerosis. Pulmonary venous hypertension. Small effusions with mild dependent atelectasis. No consolidation or lobar collapse. IMPRESSION: Cardiomegaly. Venous hypertension. Small effusions and mild dependent atelectasis. Electronically Signed   By: Nelson Chimes M.D.   On: 05/01/2017 16:41        Scheduled Meds: . brinzolamide  1 drop Right Eye BID  . carvedilol  6.25 mg Oral BID WC  . dicyclomine  10 mg Oral TID AC  . doxazosin  2 mg Oral QHS  . feeding supplement (ENSURE ENLIVE)  237 mL Oral BID BM  . ferrous sulfate  325 mg Oral Daily  . fluticasone  2 spray Each Nare BID  . guaiFENesin  600 mg Oral QHS  . heparin  5,000 Units Subcutaneous Q8H  . hydrALAZINE  100 mg Oral TID  . insulin aspart  0-9 Units Subcutaneous TID WC  . isosorbide mononitrate  240 mg Oral Daily  . latanoprost  1 drop Right Eye QHS  . montelukast  10 mg Oral QHS  . oxymetazoline  2 spray Each Nare BID  . pantoprazole  40 mg Oral Daily  . polyethylene glycol  17 g Oral Daily  . senna-docusate  1 tablet Oral BID  . timolol  1 drop Right Eye BID  . traZODone  75 mg Oral QHS  . vitamin B-12  1,000 mcg Oral Daily  . vitamin C  500 mg Oral Daily   Continuous Infusions: . sodium chloride Stopped (05/02/17 0845)  .  sodium bicarbonate (isotonic) infusion in sterile water 100 mL/hr at 05/02/17 1117     LOS: 1 day    Time spent: 40 minutes    Irine Seal, MD [facility] Pager [phone]  If 7PM-7AM, please contact night-coverage [email or web address] Password [email or web address] 05/02/2017, 11:45 AM

## 2017-05-02 NOTE — ED Notes (Signed)
Pt's renal/1500 ml fluid restrictions dinner tray ordered.

## 2017-05-02 NOTE — ED Notes (Signed)
Pharmacy notified to send medications that are missing.

## 2017-05-02 NOTE — ED Notes (Signed)
Assisted pt to the [facility].  Pt's breathing became labored with exertion.  Family at bedside.

## 2017-05-02 NOTE — ED Notes (Signed)
DNR band placed on pt's rt wrist. Yellow DNR form at bedside.

## 2017-05-02 NOTE — ED Notes (Signed)
Pt's lunch tray arrived. 

## 2017-05-02 NOTE — Progress Notes (Signed)
Patient arrived to unit from [facility] to bed with RN and NT. Patient does not complain of any pain. Daughter at bedside. Patient and family updated on plan of care, safety precautions in place.

## 2017-05-02 NOTE — ED Notes (Signed)
Pt did not eat her breakfast, states she only eats special K

## 2017-05-02 NOTE — ED Notes (Signed)
Pt's CBG result was 97. Informed Anna - RN.

## 2017-05-02 NOTE — ED Notes (Signed)
Pt's CBG result was 165. Informed Brandi - RN.

## 2017-05-02 NOTE — ED Notes (Signed)
Pt's breakfast tray arrived 

## 2017-05-02 NOTE — Progress Notes (Signed)
PT Cancellation Note  Patient Details Name: Kelly Ruiz MRN: [id number] DOB: 29-Sep-1923   Cancelled Treatment:    Reason Eval/Treat Not Completed: Medical issues which prohibited therapy. Per RN, pt unable to tolerate PT eval at this time due to dyspnea. Will hold eval for today and re-attempt tomorrow.   Lorriane Shire 05/02/2017, 12:21 PM   Lorrin Goodell, PT  Office # [phone] Pager [phone]

## 2017-05-02 NOTE — ED Notes (Signed)
Pt is eating dinner with her daughter's assist.  She reports decreased appetite.  Recommended for her to eat something to keep her sugar from decreasing tonight. Pt and daughter verbalizes understanding.

## 2017-05-02 NOTE — ED Notes (Signed)
Pt placed on bedpan

## 2017-05-02 NOTE — ED Notes (Signed)
Pt's dinner tray arrived. 

## 2017-05-02 NOTE — ED Notes (Signed)
Pt is resting and appears comfortable.  Denies any complaints or pain.  Daughter remains at bedside.

## 2017-05-02 NOTE — ED Notes (Signed)
Dr. Thompson at the bedside 

## 2017-05-02 NOTE — Progress Notes (Signed)
  [facility] Progress Note    Assessment/ Plan:   1.  AKI on CKD: likely due to vol depletion and Bactrim.  This has been stopped  2.  Hyponatremia: slight improvement- continue to monitor  3.  Metabolic acidosis: improving on bicarb gtt--> due to pleural effusions on CXR I'm going to stop gtt for now  4.  Urinary urgency: checking PVR to make sure no obstruction  Subjective:    Feeling like she has to urinate all the time.  Per son, had been prescribed Bactrim for UTI last Friday   Objective:   BP (!) 165/59   Pulse 62   Temp 98 F (36.7 C) (Oral)   Resp 20   Ht 5' (1.524 m)   Wt 67 kg (147 lb 12.8 oz)   SpO2 95%   BMI 28.87 kg/m   Intake/Output Summary (Last 24 hours) at 05/02/2017 1329 Last data filed at 05/02/2017 0913 Gross per 24 hour  Intake 1000 ml  Output 125 ml  Net 875 ml   Weight change:   Physical Exam: GEN frail, elderly, NAD HEENT EOMI PERRL NECK no JVD upright PULM clear anteriorly CV RRR ABD soft, nontender EXT trace LE edema  Imaging: Dg Chest 2 View  Result Date: 05/01/2017 CLINICAL DATA:  Worsening shortness of breath and weight gain over the last 4 days. EXAM: CHEST - 2 VIEW COMPARISON:  03/30/2017 FINDINGS: Chronic cardiomegaly and aortic atherosclerosis. Pulmonary venous hypertension. Small effusions with mild dependent atelectasis. No consolidation or lobar collapse. IMPRESSION: Cardiomegaly. Venous hypertension. Small effusions and mild dependent atelectasis. Electronically Signed   By: Nelson Chimes M.D.   On: 05/01/2017 16:41    Labs: BMET Recent Labs  Lab 05/01/17 1610 05/02/17 0209 05/02/17 0440  NA 125*  --  126*  K 5.8* 5.0 5.0  CL 98*  --  98*  CO2 17*  --  18*  GLUCOSE 173*  --  101*  BUN 91*  --  94*  CREATININE 3.07*  --  2.94*  CALCIUM 8.8*  --  8.1*   CBC Recent Labs  Lab 05/01/17 1610  WBC 5.5  HGB 9.3*  HCT 28.7*  MCV 98.6  PLT 136*    Medications:    . brinzolamide  1 drop Right Eye  BID  . carvedilol  6.25 mg Oral BID WC  . dicyclomine  10 mg Oral TID AC  . doxazosin  2 mg Oral QHS  . feeding supplement (ENSURE ENLIVE)  237 mL Oral BID BM  . ferrous sulfate  325 mg Oral Daily  . fluticasone  2 spray Each Nare BID  . guaiFENesin  600 mg Oral QHS  . heparin  5,000 Units Subcutaneous Q8H  . hydrALAZINE  100 mg Oral TID  . insulin aspart  0-9 Units Subcutaneous TID WC  . isosorbide mononitrate  240 mg Oral Daily  . latanoprost  1 drop Right Eye QHS  . montelukast  10 mg Oral QHS  . oxymetazoline  2 spray Each Nare BID  . pantoprazole  40 mg Oral Daily  . polyethylene glycol  17 g Oral Daily  . senna-docusate  1 tablet Oral BID  . timolol  1 drop Right Eye BID  . traZODone  75 mg Oral QHS  . vitamin B-12  1,000 mcg Oral Daily  . vitamin C  500 mg Oral Daily     Madelon Lips MD [facility] pgr [phone] 05/02/2017, 1:29 PM

## 2017-05-02 NOTE — ED Notes (Signed)
Pt's CBG result was 97. Informed [place] - RN.

## 2017-05-02 NOTE — Telephone Encounter (Signed)
Agree 

## 2017-05-02 NOTE — ED Notes (Signed)
Family at bedside. 

## 2017-05-03 ENCOUNTER — Inpatient Hospital Stay (HOSPITAL_COMMUNITY): Payer: Medicare Other

## 2017-05-03 DIAGNOSIS — N179 Acute kidney failure, unspecified: Principal | ICD-10-CM

## 2017-05-03 DIAGNOSIS — I5032 Chronic diastolic (congestive) heart failure: Secondary | ICD-10-CM

## 2017-05-03 LAB — GLUCOSE, CAPILLARY
GLUCOSE-CAPILLARY: 160 mg/dL — AB (ref 65–99)
GLUCOSE-CAPILLARY: 203 mg/dL — AB (ref 65–99)
GLUCOSE-CAPILLARY: 95 mg/dL (ref 65–99)
Glucose-Capillary: 168 mg/dL — ABNORMAL HIGH (ref 65–99)
Glucose-Capillary: 118 mg/dL — ABNORMAL HIGH (ref 65–99)

## 2017-05-03 LAB — RENAL FUNCTION PANEL
ALBUMIN: 3 g/dL — AB (ref 3.5–5.0)
Anion gap: 10 (ref 5–15)
BUN: 92 mg/dL — AB (ref 6–20)
CHLORIDE: 96 mmol/L — AB (ref 101–111)
CO2: 23 mmol/L (ref 22–32)
CREATININE: 2.32 mg/dL — AB (ref 0.44–1.00)
Calcium: 8.7 mg/dL — ABNORMAL LOW (ref 8.9–10.3)
GFR, EST AFRICAN AMERICAN: 20 mL/min — AB (ref >60)
GFR, EST NON AFRICAN AMERICAN: 17 mL/min — AB (ref >60)
Glucose, Bld: 106 mg/dL — ABNORMAL HIGH (ref 65–99)
PHOSPHORUS: 3.9 mg/dL (ref 2.5–4.6)
POTASSIUM: 4.5 mmol/L (ref 3.5–5.1)
Sodium: 129 mmol/L — ABNORMAL LOW (ref 135–145)

## 2017-05-03 LAB — CBC WITH DIFFERENTIAL/PLATELET
BASOS ABS: 0 10*3/uL (ref 0.0–0.1)
BASOS PCT: 0 %
Eosinophils Absolute: 0.1 10*3/uL (ref 0.0–0.7)
Eosinophils Relative: 2 %
HEMATOCRIT: 27.3 % — AB (ref 36.0–46.0)
Hemoglobin: 8.9 g/dL — ABNORMAL LOW (ref 12.0–15.0)
LYMPHS PCT: 22 %
Lymphs Abs: 1.3 10*3/uL (ref 0.7–4.0)
MCH: 32 pg (ref 26.0–34.0)
MCHC: 32.6 g/dL (ref 30.0–36.0)
MCV: 98.2 fL (ref 78.0–100.0)
Monocytes Absolute: 0.4 10*3/uL (ref 0.1–1.0)
Monocytes Relative: 7 %
NEUTROS ABS: 4.1 10*3/uL (ref 1.7–7.7)
Neutrophils Relative %: 69 %
Platelets: 136 10*3/uL — ABNORMAL LOW (ref 150–400)
RBC: 2.78 MIL/uL — AB (ref 3.87–5.11)
RDW: 13.3 % (ref 11.5–15.5)
WBC: 6 10*3/uL (ref 4.0–10.5)

## 2017-05-03 LAB — MRSA PCR SCREENING: MRSA by PCR: NEGATIVE

## 2017-05-03 LAB — TROPONIN I

## 2017-05-03 MED ORDER — IPRATROPIUM-ALBUTEROL 0.5-2.5 (3) MG/3ML IN SOLN
3.0000 mL | Freq: Two times a day (BID) | RESPIRATORY_TRACT | Status: DC
Start: 2017-05-03 — End: 2017-05-06
  Administered 2017-05-03 – 2017-05-06 (×7): 3 mL via RESPIRATORY_TRACT
  Filled 2017-05-03 (×8): qty 3

## 2017-05-03 NOTE — Progress Notes (Signed)
[facility] Progress Note    Assessment/ Plan:   1.  AKI on CKD III/VI: likely due to vol depletion and Bactrim.  This has been stopped.  Her Cr is downtrending and her UOP is picking up.  There are no indications for dialysis at present.  Continue to hold Lasix--> may need to restart but will hold off for now  2.  Hyponatremia: improved after d/c of Lasix and bicarb gtt  3.  Metabolic acidosis: improving on bicarb gtt-->  Transitioned to PO  4.  Urinary urgency: Foley in place,  UA negative, don't need to restart abx for now  5.  Pulmonary HTN: PA pressures 50.  Diuretics will be tricky to manage  6.  Hyperkalemia: resolved.  Likely due to Bactrim.  7.  Dispo: pending  Subjective:    Feeling better, Cr is coming down, metabolic acidosis and K improving.     Objective:   BP (!) 127/47 (BP Location: Left Arm)   Pulse 63   Temp 97.8 F (36.6 C) (Oral)   Resp 20   Ht 5' (1.524 m)   Wt 67.2 kg (148 lb 3.2 oz)   SpO2 97%   BMI 28.94 kg/m   Intake/Output Summary (Last 24 hours) at 05/03/2017 1554 Last data filed at 05/03/2017 1300 Gross per 24 hour  Intake 820 ml  Output 1900 ml  Net -1080 ml   Weight change: 0.998 kg (2 lb 3.2 oz)  Physical Exam: GEN frail, elderly, NAD HEENT EOMI PERRL NECK no JVD upright PULM clear anteriorly, off O2 CV RRR ABD soft, nontender EXT trace LE edema  Imaging: Dg Chest 2 View  Result Date: 05/01/2017 CLINICAL DATA:  Worsening shortness of breath and weight gain over the last 4 days. EXAM: CHEST - 2 VIEW COMPARISON:  03/30/2017 FINDINGS: Chronic cardiomegaly and aortic atherosclerosis. Pulmonary venous hypertension. Small effusions with mild dependent atelectasis. No consolidation or lobar collapse. IMPRESSION: Cardiomegaly. Venous hypertension. Small effusions and mild dependent atelectasis. Electronically Signed   By: Nelson Chimes M.D.   On: 05/01/2017 16:41   Nm Pulmonary Perf And Vent  Result Date:  05/02/2017 CLINICAL DATA:  Shortness of breath EXAM: NUCLEAR MEDICINE VENTILATION - PERFUSION LUNG SCAN VIEWS: Anterior, posterior, left lateral, right lateral, RPO, LPO, RAO, LAO-ventilation and perfusion RADIOPHARMACEUTICALS:  31.2 mCi of Tc-27m DTPA aerosol inhalation and 4.15 mCi Tc89m-MAA IV COMPARISON:  Chest radiograph May 01, 2017 FINDINGS: Ventilation: Radiotracer uptake on the ventilation study shows patchy areas of decreased radiotracer uptake in the right mid lung region. No well-defined segmental ventilation defects. Perfusion: Radiotracer uptake on the perfusion study is homogeneous and symmetric. There is no appreciable perfusion abnormality in the areas of relative photopenia in the right lung on the ventilation study. No appreciable perfusion defects evident. IMPRESSION: No appreciable perfusion defects. This study constitutes a very low probability of pulmonary embolus. Decreased radiotracer uptake in portions of the right mid lung on the ventilation study potentially may indicate a degree of underlying COPD involving portions of the right lung. Electronically Signed   By: Lowella Grip III M.D.   On: 05/02/2017 15:44    Labs: BMET Recent Labs  Lab 05/01/17 1610 05/02/17 0209 05/02/17 0440 05/03/17 0515  NA 125*  --  126* 129*  K 5.8* 5.0 5.0 4.5  CL 98*  --  98* 96*  CO2 17*  --  18* 23  GLUCOSE 173*  --  101* 106*  BUN 91*  --  94* 92*  CREATININE  3.07*  --  2.94* 2.32*  CALCIUM 8.8*  --  8.1* 8.7*  PHOS  --   --   --  3.9   CBC Recent Labs  Lab 05/01/17 1610 05/03/17 0515  WBC 5.5 6.0  NEUTROABS  --  4.1  HGB 9.3* 8.9*  HCT 28.7* 27.3*  MCV 98.6 98.2  PLT 136* 136*    Medications:    . brinzolamide  1 drop Right Eye BID  . budesonide (PULMICORT) nebulizer solution  0.5 mg Nebulization BID  . carvedilol  6.25 mg Oral BID WC  . dicyclomine  10 mg Oral TID AC  . doxazosin  2 mg Oral QHS  . feeding supplement (ENSURE ENLIVE)  237 mL Oral BID BM  .  ferrous sulfate  325 mg Oral Daily  . fluticasone  2 spray Each Nare BID  . guaiFENesin  600 mg Oral QHS  . heparin  5,000 Units Subcutaneous Q8H  . hydrALAZINE  100 mg Oral TID  . insulin aspart  0-9 Units Subcutaneous TID WC  . ipratropium-albuterol  3 mL Nebulization BID  . isosorbide mononitrate  240 mg Oral Daily  . latanoprost  1 drop Right Eye QHS  . montelukast  10 mg Oral QHS  . oxymetazoline  2 spray Each Nare BID  . pantoprazole  40 mg Oral Daily  . polyethylene glycol  17 g Oral Daily  . senna-docusate  1 tablet Oral BID  . sodium bicarbonate  650 mg Oral BID  . timolol  1 drop Right Eye BID  . traZODone  75 mg Oral QHS  . vitamin B-12  1,000 mcg Oral Daily  . vitamin C  500 mg Oral Daily     Madelon Lips MD [facility] pgr [phone] 05/03/2017, 3:54 PM

## 2017-05-03 NOTE — Progress Notes (Signed)
Patients son requests patient eye vitamin, MD paged. MD states med can't be ordered, family notified.

## 2017-05-03 NOTE — Evaluation (Signed)
Occupational Therapy Evaluation Patient Details Name: Kelly Ruiz MRN: [id number] DOB: 1923-12-20 Today's Date: 05/03/2017    History of Present Illness Pt is a 84 y/o female admitted secondary to SOB and AKI. PMH including but not limited to diastolic CHF, CKD stage 4, HTN, DM2.   Clinical Impression   Pt admitted with the above diagnoses and presents with below problem list. Pt will benefit from continued acute OT to address the below listed deficits and maximize independence with ADLs prior to d/c to venue below. PTA pt was needing some assist with ADLs, mod I with functional mobility. Pt is currently mod A for UB/LB ADLs, min guard with functional mobility. Son present and involved during session.       Follow Up Recommendations  SNF    Equipment Recommendations  Other (comment)(defer to next venue)    Recommendations for Other Services       Precautions / Restrictions Precautions Precautions: Fall Restrictions Weight Bearing Restrictions: No      Mobility Bed Mobility Overal bed mobility: Needs Assistance Bed Mobility: Sit to Supine     Supine to sit: Min guard Sit to supine: Min guard   General bed mobility comments: increased time and effort, use of bed rails, min guard for safety  Transfers Overall transfer level: Needs assistance Equipment used: Rolling walker (2 wheeled) Transfers: Sit to/from Stand Sit to Stand: Min guard         General transfer comment: min guard for safety    Balance Overall balance assessment: Needs assistance Sitting-balance support: Feet supported Sitting balance-Leahy Scale: Good     Standing balance support: During functional activity;Bilateral upper extremity supported Standing balance-Leahy Scale: Poor                             ADL either performed or assessed with clinical judgement   ADL Overall ADL's : Needs assistance/impaired Eating/Feeding: Set up;Sitting   Grooming: Min guard;Oral  care;Wash/dry hands;Standing   Upper Body Bathing: Moderate assistance;Sitting   Lower Body Bathing: Moderate assistance;Sit to/from stand   Upper Body Dressing : Moderate assistance;Sitting   Lower Body Dressing: Moderate assistance;Sit to/from stand   Toilet Transfer: Min guard;Ambulation;Grab bars;Comfort height toilet;RW   Toileting- Clothing Manipulation and Hygiene: Moderate assistance;Sit to/from stand   Tub/ Shower Transfer: Minimal assistance;Ambulation;Rolling walker   Functional mobility during ADLs: Min guard;Rolling walker General ADL Comments: Pt completed toilet transfer, pericare, and grooming tasks as detailed above. Son present and invovled during session.      Vision         Perception     Praxis      Pertinent Vitals/Pain Pain Assessment: No/denies pain     Hand Dominance Right   Extremity/Trunk Assessment Upper Extremity Assessment Upper Extremity Assessment: Generalized weakness;[facility] for tasks assessed   Lower Extremity Assessment Lower Extremity Assessment: Defer to PT evaluation   Cervical / Trunk Assessment Cervical / Trunk Assessment: Kyphotic   Communication Communication Communication: No difficulties   Cognition Arousal/Alertness: Awake/alert Behavior During Therapy: WFL for tasks assessed/performed Overall Cognitive Status: Within Functional Limits for tasks assessed                                     General Comments       Exercises     Shoulder Instructions      Home Living Family/patient expects to be  discharged to:: Skilled nursing facility Living Arrangements: Spouse/significant other;Other (Comment)(caretakers 24/7)                               Additional Comments: pt with recent admission and d/c to SNF. pt's son reported that she has been home since 4/19 and was able to ambulate within her home with RW, and required assistance from an aide for ADLs      Prior  Functioning/Environment Level of Independence: Needs assistance  Gait / Transfers Assistance Needed: ambulates with rollator ADL's / Homemaking Assistance Needed: requires assistance from an aide for bathing and dressing            OT Problem List: Impaired balance (sitting and/or standing);Decreased knowledge of use of DME or AE;Decreased knowledge of precautions;Cardiopulmonary status limiting activity;Decreased activity tolerance;Decreased strength      OT Treatment/Interventions: Self-care/ADL training;DME and/or AE instruction;Energy conservation;Therapeutic activities;Patient/family education;Balance training    OT Goals(Current goals can be found in the care plan section) Acute Rehab OT Goals Patient Stated Goal: go to rehab to get stronger OT Goal Formulation: With patient/family Time For Goal Achievement: 05/10/17 Potential to Achieve Goals: Good ADL Goals Pt Will Perform Grooming: with modified independence;standing Pt Will Perform Upper Body Bathing: with min assist;sitting Pt Will Perform Lower Body Bathing: with min assist;sit to/from stand Pt Will Transfer to Toilet: with modified independence;ambulating Pt Will Perform Toileting - Clothing Manipulation and hygiene: with modified independence;sit to/from stand  OT Frequency: Min 2X/week   Barriers to D/C:            Co-evaluation              AM-PAC PT "6 Clicks" Daily Activity     Outcome Measure Help from another person eating meals?: None Help from another person taking care of personal grooming?: A Little Help from another person toileting, which includes using toliet, bedpan, or urinal?: A Lot Help from another person bathing (including washing, rinsing, drying)?: A Lot Help from another person to put on and taking off regular upper body clothing?: A Lot Help from another person to put on and taking off regular lower body clothing?: A Lot 6 Click Score: 15   End of Session Equipment Utilized During  Treatment: Rolling walker  Activity Tolerance: Patient tolerated treatment well Patient left: in bed;with call bell/phone within reach;with nursing/sitter in room;with family/visitor present  OT Visit Diagnosis: Unsteadiness on feet (R26.81);Muscle weakness (generalized) (M62.81)                Time: 6578-4696 OT Time Calculation (min): 22 min Charges:  OT General Charges $OT Visit: 1 Visit OT Evaluation $OT Eval Low Complexity: 1 Low G-Codes:       Hortencia Pilar 05/03/2017, 11:42 AM

## 2017-05-03 NOTE — Consult Note (Addendum)
Cardiology Consultation:   Patient ID: Kelly Ruiz; [id number]; 12-06-1923   Admit date: 05/01/2017 Date of Consult: 05/03/2017  Primary Care Provider: Ronita Hipps, MD Primary Cardiologist: Minus Breeding, MD   Patient Profile:   Kelly Ruiz is a 84 y.o. female with a hx of chronic diastolic dysfunction, stage IV CKD, HTN and T2DM, admitted for acute on chronic renal failure, who is being seen today for management of chronic diastolic HF and evaluation for dyspnea at the request of Dr. Grandville Silos, Internal Medicine.  History of Present Illness:   [name] is followed by Dr. Percival Spanish and has a h/o chronic diastolic dysfunction, CKD stage IV, HTN, and T2DM. She had a remote abnormal Myoview but her most recent Myoview Jan 2016 was normal. Echo in 2018 demonstrated NL EF with evidence of moderate LVH. Over the past few months, she has had changes in her diuretic regimen due to AKI and dehydration.   She was admitted to medicine service last month, from 3/12/-04/04/17, for pre renal AKI/ dehydration, felt to be diuretic induced. Admit SCr was 3.9. Home diuretic was held, ARB discontinued and she was treated with aggressive IVF hydration and subsquently developed acute on chronic diastolic CHF due to fluid overload. BNP was elevated at 1844. She was placed back on IV Lasix for diuresis and discharged home on 40 mg of PO lasix once daily. Discharge SCr was 1.48. Discharge weight was 143 lb. She was discharged to a SNF and her PO lasix was further reduced down to 20 mg daily. Subsequently, she developed progressive weight gain, SOB and weakness. Our office was contacted on 4/17 and pt advised to increase Lasix to 60 mg x 1 dose, then return back to original dose of 40 mg daily.   However pt had little improvement with these measures, causing her to return to the [facility] ED on 05/01/17. Also of note, pt was recently prescribed Bactrim last week for UTI.   In the ED, she was found to have an AKI with SCr  back up to 3.0. BUN 91. She was also hyponatremic at 125 and hyperkalemic at 5.8. Bicarb 17. Felt to have metabolic acidosis. CXR negative for pulmonary edema. BNP was abnormal, but much lower than previous admission. BNP 517 (1,844 03/2017).   Pt has been admitted by IM. Diuretics are on hold for AKI. IVFs given + bicarb. EDP gave kayexalate for hyperkalemia. SCr has improved from 3.0>>2.32. K WNL at 4.5. Na gradually improving, now at 129. Nephrology was consulted day of admit and agreed that AKI was likely prerenal and felt that she was on the dry side and agreed with holding diuretics and treating with IVFs.   Cardiology consulted to assist with management of chronic diastolic HF and dyspnea. Net I/Os are +956.7. Weight today is 148 lb (143 lb at previous discharge in March).   She reports feeling better today but notes that she was experiencing exertional dyspnea and had a 4 lb weight gain overnight. She also notes recent 2 pillow orthopnea. No chest pain.   Past Medical History:  Diagnosis Date  . Anemia   . Arthritis    "mostly in my knees, hands/fingers" (05/02/2017)  . Chest pain with moderate risk of acute coronary syndrome 11/08/2015  . CHF (congestive heart failure) (HCC)    Diastolic. Precipitated by Actos  . CKD (chronic kidney disease), stage IV (HCC)    mild-moderate. GFR was 35 in 02/12  . Coronary artery disease 02/2010   Last Lexiscan 11/2015 low  risk.   . Dyslipidemia   . Dyspnea   . GERD (gastroesophageal reflux disease)   . History of blood transfusion    "related to anemia" (05/02/2017)  . Hypertension   . Nasal polyps   . Pneumonia    "I don't remember but 1 time" (05/02/2017)  . Type II diabetes mellitus ([place])   . Unspecified hypertensive heart disease without heart failure     Past Surgical History:  Procedure Laterality Date  . ABDOMINAL HYSTERECTOMY    . APPENDECTOMY  1951  . CATARACT EXTRACTION W/ INTRAOCULAR LENS  IMPLANT, BILATERAL Bilateral   .  CHOLECYSTECTOMY OPEN    . GLAUCOMA SURGERY Left 10/29/2013  . NASAL POLYP SURGERY    . NECK MASS EXCISION Left    non cancerous  . TONSILLECTOMY       Home Medications:  Prior to Admission medications   Medication Sig Start Date End Date Taking? Authorizing Provider  acetaminophen (TYLENOL) 500 MG tablet Take 1,000 mg by mouth 2 (two) times daily.    Yes [provider]  brinzolamide (AZOPT) 1 % ophthalmic suspension Place 1 drop into the right eye 2 (two) times daily.    Yes [provider]  butalbital-acetaminophen-caffeine (FIORICET, ESGIC) 50-325-40 MG tablet Take 1 tablet by mouth every 6 (six) hours as needed for headache. 04/04/17  Yes Lavina Hamman, MD  carvedilol (COREG) 6.25 MG tablet Take 1 tablet (6.25 mg total) by mouth 2 (two) times daily with a meal. 04/04/17  Yes Lavina Hamman, MD  Cyanocobalamin (B-12 COMPLIANCE INJECTION IJ) Inject as directed. Inject 1cc intramuscularly in the evening on the 27th and ending on the 27th every month for prophylaxis   Yes [provider]  dicyclomine (BENTYL) 10 MG capsule Take 10 mg by mouth 3 (three) times daily before meals.    Yes [provider]  docusate sodium (COLACE) 100 MG capsule Take 1 capsule (100 mg total) by mouth 2 (two) times daily as needed for mild constipation. 04/04/17  Yes Lavina Hamman, MD  doxazosin (CARDURA) 2 MG tablet Take 1 tablet (2 mg total) by mouth at bedtime. 04/04/17  Yes Lavina Hamman, MD  feeding supplement, ENSURE ENLIVE, (ENSURE ENLIVE) LIQD Take 237 mLs by mouth 2 (two) times daily between meals. Patient taking differently: Take 237 mLs by mouth 2 (two) times daily between meals. Strawberry 04/04/17  Yes Lavina Hamman, MD  ferrous sulfate 325 (65 FE) MG tablet Take 325 mg by mouth daily.    Yes [provider]  fluticasone (FLONASE) 50 MCG/ACT nasal spray Place 2 sprays into both nostrils 2 (two) times daily.   Yes [provider]  furosemide  (LASIX) 40 MG tablet Take 1 tablet (40 mg total) by mouth daily. 04/30/17  Yes Kilroy, Luke K, PA-C  GLIPIZIDE PO Take 2 mg by mouth every morning.    Yes [provider]  guaiFENesin (MUCINEX) 600 MG 12 hr tablet Take 1 tablet (600 mg total) by mouth 2 (two) times daily. Patient taking differently: Take 600 mg by mouth at bedtime.  04/04/17  Yes Lavina Hamman, MD  hydrALAZINE (APRESOLINE) 100 MG tablet Take 1 tablet (100 mg total) by mouth 3 (three) times daily. 04/04/17  Yes Lavina Hamman, MD  isosorbide mononitrate (IMDUR) 120 MG 24 hr tablet Take 2 tablets (240 mg total) by mouth daily. 05/03/16  Yes Nephtali Docken, Jeneen Rinks, MD  latanoprost (XALATAN) 0.005 % ophthalmic solution Place 1 drop into the right eye  at bedtime.  01/20/13  Yes [provider]  Melatonin 3 MG TABS Take 3 mg by mouth at bedtime.   Yes [provider]  montelukast (SINGULAIR) 10 MG tablet Take 10 mg by mouth at bedtime.   Yes [provider]  Multiple Vitamins-Minerals (EYE VITAMINS PO) Take 1 tablet by mouth 2 (two) times daily. Lunch and at bedtime prervision Ared 2   Yes [provider]  nitroGLYCERIN (NITROSTAT) 0.4 MG SL tablet Place 1 tablet (0.4 mg total) under the tongue every 5 (five) minutes as needed for chest pain. 03/22/15  Yes Minus Breeding, MD  omeprazole (PRILOSEC) 20 MG capsule Take 20 mg by mouth daily.   Yes [provider]  oxymetazoline (AFRIN) 0.05 % nasal spray Place 2 sprays into both nostrils 2 (two) times daily. 20 minutes before the Flonase Nasal   Yes [provider]  sulfamethoxazole-trimethoprim (BACTRIM DS,SEPTRA DS) 800-160 MG tablet Take 1 tablet by mouth 2 (two) times daily. For urinary tract infection   Yes [provider]  timolol (TIMOPTIC) 0.5 % ophthalmic solution Place 1 drop into the right eye 2 (two) times daily.  01/14/12  Yes [provider]  traZODone (DESYREL) 50 MG tablet Take 75 mg by mouth at bedtime.  09/28/16  Yes [provider]  vitamin B-12 (CYANOCOBALAMIN) 1000 MCG tablet Take 1,000 mcg by mouth daily.   Yes [provider]  vitamin C (ASCORBIC ACID) 500 MG tablet Take 500 mg by mouth daily.    Yes [provider]    Inpatient Medications: Scheduled Meds: . brinzolamide  1 drop Right Eye BID  . budesonide (PULMICORT) nebulizer solution  0.5 mg Nebulization BID  . carvedilol  6.25 mg Oral BID WC  . dicyclomine  10 mg Oral TID AC  . doxazosin  2 mg Oral QHS  . feeding supplement (ENSURE ENLIVE)  237 mL Oral BID BM  . ferrous sulfate  325 mg Oral Daily  . fluticasone  2 spray Each Nare BID  . guaiFENesin  600 mg Oral QHS  . heparin  5,000 Units Subcutaneous Q8H  . hydrALAZINE  100 mg Oral TID  . insulin aspart  0-9 Units Subcutaneous TID WC  . ipratropium-albuterol  3 mL Nebulization TID  . isosorbide mononitrate  240 mg Oral Daily  . latanoprost  1 drop Right Eye QHS  . montelukast  10 mg Oral QHS  . oxymetazoline  2 spray Each Nare BID  . pantoprazole  40 mg Oral Daily  . polyethylene glycol  17 g Oral Daily  . senna-docusate  1 tablet Oral BID  . sodium bicarbonate  650 mg Oral BID  . timolol  1 drop Right Eye BID  . traZODone  75 mg Oral QHS  . vitamin B-12  1,000 mcg Oral Daily  . vitamin C  500 mg Oral Daily   Continuous Infusions:  PRN Meds: acetaminophen **OR** acetaminophen, ipratropium-albuterol, ipratropium-albuterol, ondansetron **OR** ondansetron (ZOFRAN) IV  Allergies:    Allergies  Allergen Reactions  . Actos [Pioglitazone Hydrochloride]   . Felodipine Er   . Gabapentin Other (See Comments)    Went to sleep and could not wake up  . Hctz [Hydrochlorothiazide]   . Lisinopril   . Plendil [Felodipine]   . Amlodipine Besylate Other (See Comments) and Hives  . Brimonidine Tartrate Other (See Comments)    Eye redness and itchiness, blurred vision  . Clonidine Derivatives Rash  . Combigan [Brimonidine Tartrate-Timolol] Other  (See Comments)  Eye redness and itchiness, blurred vision  . Dorzolamide Other (See Comments)    Increased eye pressure, blurred vision  . Monopril [Fosinopril Sodium] Cough  . Norvasc [Amlodipine Besylate] Hives  . Penicillins Rash    Has patient had a PCN reaction causing immediate rash, facial/tongue/throat swelling, SOB or lightheadedness with hypotension: unknown Has patient had a PCN reaction causing severe rash involving mucus membranes or skin necrosis: unknown Has patient had a PCN reaction that required hospitalization: unknown Has patient had a PCN reaction occurring within the last 10 years: no If all of the above answers are "NO", then may proceed with Cephalosporin use.     Social History:   Social History   Socioeconomic History  . Marital status: Married    Spouse name: Not on file  . Number of children: 3  . Years of education: Not on file  . Highest education level: Not on file  Occupational History  . Occupation: retired  Scientific laboratory technician  . Financial resource strain: Not on file  . Food insecurity:    Worry: Not on file    Inability: Not on file  . Transportation needs:    Medical: Not on file    Non-medical: Not on file  Tobacco Use  . Smoking status: Never Smoker  . Smokeless tobacco: Never Used  Substance and Sexual Activity  . Alcohol use: No    Alcohol/week: 0.0 oz  . Drug use: No  . Sexual activity: Not on file  Lifestyle  . Physical activity:    Days per week: Not on file    Minutes per session: Not on file  . Stress: Not on file  Relationships  . Social connections:    Talks on phone: Not on file    Gets together: Not on file    Attends religious service: Not on file    Active member of club or organization: Not on file    Attends meetings of clubs or organizations: Not on file    Relationship status: Not on file  . Intimate partner violence:    Fear of current or ex partner: Not on file    Emotionally abused: Not on file     Physically abused: Not on file    Forced sexual activity: Not on file  Other Topics Concern  . Not on file  Social History Narrative  . Not on file    Family History:    Family History  Problem Relation Age of Onset  . Diabetes Unknown   . Hypertension Unknown   . Stroke Unknown   . Asthma Mother   . Allergic rhinitis Mother   . Congestive Heart Failure Mother   . Stroke Mother      ROS:  Please see the history of present illness.   All other ROS reviewed and negative.     Physical Exam/Data:   Vitals:   05/02/17 2330 05/03/17 0502 05/03/17 0819 05/03/17 0909  BP: (!) 173/53 (!) 149/38  (!) 149/46  Pulse: 65 64  67  Resp: 20 20  20   Temp: 98.2 F (36.8 C) (!) 97.4 F (36.3 C)  98 F (36.7 C)  TempSrc: Oral Oral  Oral  SpO2: 97% 99% 97% 96%  Weight:  148 lb 3.2 oz (67.2 kg)    Height:        Intake/Output Summary (Last 24 hours) at 05/03/2017 1007 Last data filed at 05/03/2017 0900 Gross per 24 hour  Intake 1681.67 ml  Output 1600 ml  Net 81.67 ml   Filed Weights   05/01/17 1521 05/02/17 2116 05/03/17 0502  Weight: 147 lb 12.8 oz (67 kg) 150 lb (68 kg) 148 lb 3.2 oz (67.2 kg)   Body mass index is 28.94 kg/m.  General:  Well nourished, well developed, in no acute distress, elderly  HEENT: normal Lymph: no adenopathy Neck: no JVD Endocrine:  No thryomegaly Vascular: No carotid bruits; FA pulses 2+ bilaterally without bruits  Cardiac:  normal S1, S2; RRR; no murmur  Lungs:  clear to auscultation bilaterally, no wheezing, rhonchi or rales  Abd: soft, nontender, no hepatomegaly  Ext: no edema Musculoskeletal:  No deformities, BUE and BLE strength normal and equal Skin: warm and dry  Neuro:  CNs 2-12 intact, no focal abnormalities noted Psych:  Normal affect   EKG:  The EKG was personally reviewed and demonstrates:  NSR, LAFB, LVH Telemetry:  Telemetry was personally reviewed and demonstrates:  NSR, 60s  Relevant CV Studies: 2D Echo 03/31/17  Study  Conclusions  - Left ventricle: The cavity size was normal. Wall thickness was   increased in a pattern of moderate LVH. Systolic function was   normal. The estimated ejection fraction was in the range of 60%   to 65%. Wall motion was normal; there were no regional wall   motion abnormalities. Features are consistent with a pseudonormal   left ventricular filling pattern, with concomitant abnormal   relaxation and increased filling pressure (grade 2 diastolic   dysfunction). - Mitral valve: Severely calcified annulus. There was moderate   regurgitation. - Left atrium: The atrium was moderately dilated. - Pulmonary arteries: Systolic pressure was moderately increased.   PA peak pressure: 50 mm Hg (S).  Laboratory Data:  Chemistry Recent Labs  Lab 05/01/17 1610 05/02/17 0209 05/02/17 0440 05/03/17 0515  NA 125*  --  126* 129*  K 5.8* 5.0 5.0 4.5  CL 98*  --  98* 96*  CO2 17*  --  18* 23  GLUCOSE 173*  --  101* 106*  BUN 91*  --  94* 92*  CREATININE 3.07*  --  2.94* 2.32*  CALCIUM 8.8*  --  8.1* 8.7*  GFRNONAA 12*  --  13* 17*  GFRAA 14*  --  15* 20*  ANIONGAP 10  --  10 10    Recent Labs  Lab 05/03/17 0515  ALBUMIN 3.0*   Hematology Recent Labs  Lab 05/01/17 1610 05/03/17 0515  WBC 5.5 6.0  RBC 2.91* 2.78*  HGB 9.3* 8.9*  HCT 28.7* 27.3*  MCV 98.6 98.2  MCH 32.0 32.0  MCHC 32.4 32.6  RDW 13.1 13.3  PLT 136* 136*   Cardiac Enzymes Recent Labs  Lab 05/02/17 2113 05/02/17 2323  TROPONINI <0.03 <0.03    Recent Labs  Lab 05/01/17 1607  TROPIPOC 0.01    BNP Recent Labs  Lab 05/01/17 1612  BNP 517.7*    DDimer No results for input(s): DDIMER in the last 168 hours.  Radiology/Studies:  Dg Chest 2 View  Result Date: 05/01/2017 CLINICAL DATA:  Worsening shortness of breath and weight gain over the last 4 days. EXAM: CHEST - 2 VIEW COMPARISON:  03/30/2017 FINDINGS: Chronic cardiomegaly and aortic atherosclerosis. Pulmonary venous hypertension. Small  effusions with mild dependent atelectasis. No consolidation or lobar collapse. IMPRESSION: Cardiomegaly. Venous hypertension. Small effusions and mild dependent atelectasis. Electronically Signed   By: Nelson Chimes M.D.   On: 05/01/2017 16:41   Nm Pulmonary Perf And Vent  Result Date: 05/02/2017 CLINICAL  DATA:  Shortness of breath EXAM: NUCLEAR MEDICINE VENTILATION - PERFUSION LUNG SCAN VIEWS: Anterior, posterior, left lateral, right lateral, RPO, LPO, RAO, LAO-ventilation and perfusion RADIOPHARMACEUTICALS:  31.2 mCi of Tc-88m DTPA aerosol inhalation and 4.15 mCi Tc28m-MAA IV COMPARISON:  Chest radiograph May 01, 2017 FINDINGS: Ventilation: Radiotracer uptake on the ventilation study shows patchy areas of decreased radiotracer uptake in the right mid lung region. No well-defined segmental ventilation defects. Perfusion: Radiotracer uptake on the perfusion study is homogeneous and symmetric. There is no appreciable perfusion abnormality in the areas of relative photopenia in the right lung on the ventilation study. No appreciable perfusion defects evident. IMPRESSION: No appreciable perfusion defects. This study constitutes a very low probability of pulmonary embolus. Decreased radiotracer uptake in portions of the right mid lung on the ventilation study potentially may indicate a degree of underlying COPD involving portions of the right lung. Electronically Signed   By: Lowella Grip III M.D.   On: 05/02/2017 15:44    Assessment and Plan:   Dealva Lafoy is a 84 y.o. female with a hx of chronic diastolic dysfunction, stage IV CKD, HTN and T2DM, admitted for acute on chronic renal failure, who is being seen today for management of chronic diastolic HF and evaluation for dyspnea at the request of Dr. Grandville Silos, Internal Medicine.   1. Acute on Chronic Renal Failure: felt to be pre-renal and 2/2 to diuretics and Bactrim (recently prescribed for UTI). Pt also admits to reduced PO intake/ poor  appetite. PO lasix on hold. Bactrim discontinued. Nephrology following. Pt given IVFs + bicarb (metabolic acidosis) at time of admission. IVFs now turned off. SCr improving, down from 3.07>>2.94>>2.32. Continue to follow recs per nephrology.    2. Dyspnea: V/Q scan low probability for PE. Admit CXR showed venous hypertension, small pleural effusions and mild dependent atelectasis. No consolidation. BNP abnormal at 517, but much lower than previous admission when BNP was 1,844. Her weight however is up 5 lb above previous discharge weight, up from 143 lb to 148 lb and pt endorses exertional dyspnea and 2 pillow othopnea. 2D echo shows normal LVEF, G2DD, moderate MR and moderate pulmonary HTN. Troponin's are negative x 3 and echo with normal EF and wall motion.   3. Chronic Diastolic HF: EF normal by echo. G2DD noted. Per above. Weight up 5 lb from previous discharge weight. Difficult to manage volume status given recent issues with acute on chronic renal failure w/ rising creating, limiting use of diuretics. Will need nephrology to assist with dosing of diuretics.    4. Mitral Regurgitation: moderate MR noted on echo.   5. Pulmonary Hypertension: PA pressure 50 mm Hg by echo. Currently off diuretics due to AKI.   6. Hyperkalemia: resolved after Kayexalate. 4.5 today.   7. HTN: mildly elevated. Lasix on hold. Her ARB was discontinued last month due to worsening kidney disease. She is on Coreg, Imdur, hydralazine and doxazosin. Continue to monitor.   8. Hyponatremia: gradually improving, up from 125>>129.   For questions or updates, please contact [place] Please consult [email or web address] for contact info under Cardiology/STEMI.   Signed, Lyda Jester, PA-C  05/03/2017 10:07 AM   History and all data above reviewed.  Patient examined.  I agree with the findings as above.  The patient is well known to me.  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  Still married to the same boy.  She has become increasingly more frail  and her volume difficult to manage with CKD.  We have tried to manage the meds over the phone with weights and frequent labs.  Her son takes great care of both parents.  However, she presents with weight gain four pounds over one night on Sunday.  She did have a UTI and got some antibiotics a few days prior to the acute decompensation.  A neighbor brought chicken soup without salt on Sat.  Other than those two changes I don't see anything else that would have caused an acute exacerbation of diastolic HF.  She doesn't have chest pain or palpitations.  Her son, who I talked to on the phone, says that this has been a pattern.  She does well at rehab and then decompensates at home.  Again, I believe that they are very compliant with salt, fluids, weights.   She feels better currently..    The patient exam reveals COR: Regular  ,  Lungs: Decreased breath sounds on the left side.   ,  Abd: Positive bowel sounds, no rebound no guarding, Ext No edema  .  All available labs, radiology testing, previous records reviewed. Agree with documented assessment and plan. Acute on chronic RI:  Agree with holding diuretics for now.  The bigger issue is that we are not able to maintain "wet kidneys and dry lungs".  I have asked the son to think about home hospice care and whether this might add anything to her home health care.  She tunes up nicely in the hospital and is fairly easy to manage.  Hopefully we could reproduce this at home with more frequent lab draws and phone calls with more frequent nursing visits.      Jeneen Rinks Jagar Lua  1:14 PM  05/03/2017

## 2017-05-03 NOTE — Progress Notes (Signed)
PROGRESS NOTE    Kelly Ruiz  PIR:[id number] DOB: Oct 19, 1923 DOA: 05/01/2017 PCP: Ronita Hipps, MD    Brief Narrative:  84 y.o.femalewith medical history significant ofdiastolic CHF, CKD stage 4, HTN, DM2. Patient recently admitted to hospitalist service from 3/12-3/21 for AKI believed to be pre-renal and diuretic induced. She was initially hydrated until 3/16 with improving kidney function, then developed acute on chronic diastolic CHF due to fluid overload. She was diuresed from that point on until discharge. Creat at discharge was 1.48 down from 3.9 on admission. It does not appear that nephrology was consulted at that time. CT at that time showed large bowel stool with atrophic kidneys She was quite anemic during the hospitalization and it appeared that she was iron deficient. She was discharged off her ARB and was given a regimen of lasix based on weight.  She was discharged from the rehab facility last week and her son noted and increase in weight and dyspnea  He gave her the extra lasix as prescribed but brought her to the hospital as she was getting weaker and had worsening dyspnea. Her labs in the ER showed K 5.8  Na 125  CO2 17  BUN 91  Cr 3.07  Ca 8.8  Glc 173         Assessment & Plan:   Principal Problem:   Acute renal failure superimposed on stage 4 chronic kidney disease (HCC) Active Problems:   Chronic diastolic congestive heart failure (HCC)   Essential hypertension   DM (diabetes mellitus), type 2 with renal complications (HCC)   Dyspnea, unspecified   Hyperkalemia   CKD (chronic kidney disease), stage IV (HCC)   AKI (acute kidney injury) (HCC)   Metabolic acidosis, NAG, failure of bicarbonate regeneration   Hyponatremia   Weakness  1 acute on chronic kidney disease stage IV Questionable etiology.  Felt likely secondary to a prerenal azotemia as patient noted to be on diuretics.  Patient does not look volume overloaded on examination.  BNP was elevated  but not significantly as during last hospitalization.  Patient was given some boluses of normal saline in the ED. Patient on gentle hydration 75 cc/h with improvement in renal function.  IV fluids have been saline locked. Nephrology following.  2.  Dyspnea Patient significantly dyspneic on minimal exertion per patient and per RN.  Patient even short of breath while taking multiple medications and on minimal exertion.  Patient denies any chest pain.  Patient not volume overloaded clinically on examination.  Chest x-ray negative for any pulmonary edema.  May be secondary to worsening renal function versus cardiac etiology versus pulmonary etiology.  Cardiac enzymes negative x3.   Recent 2D echo 03/31/2017 with a EF of 60-65%, no wall motion abnormalities, grade 2 diastolic dysfunction.  Moderate mitral valvular regurgitation.  Moderately dilated left atrium.  Systolic pressure moderately increased with a PA peak pressure of 50 mmHg.  VQ scan with low probability for PE.  Lower extremity Dopplers pending.  ABG was done which was unremarkable. We will not repeat 2D echo at this time. Consult with cardiology for further evaluation to see if patient does have a symptomatic mitral valve regurgitation/cardiac etiology.  Supportive care.  3.  Hypertension Continue current regimen of Coreg, Cardura, hydralazine, imdur.  4.  Hyperkalemia Patient given some Kayexalate as well as IV bicarb with resolution of hyperkalemia.  Potassium level at 4.5.  Continue renal diet.  Nephrology following.  5.  Weakness Questionable etiology.  May be secondary to  problem #1.  PT/OT.  6.  Hyponatremia Questionable etiology.  Likely secondary to hypovolemic hyponatremia. Sodium levels improved with gentle hydration.  Be secondary to hypovolemic hyponatremia.  TSH pending.  Sodium 62. Urine creatinine 20.83.  Urine osmolality was 273.  IV fluids have been saline locked.  Follow.  Per nephrology.  7.  Acidosis Likely secondary to  problem #1.  Patient placed on bicarb tablets twice daily with improvement.  Follow.  Per nephrology.  8.  Diastolic heart failure Patient currently compensated.  Does not look fluid overloaded.  Chest x-ray negative for any pulmonary edema.  BNP in the 500s compared to 1800s during recent prior hospitalization.  Continue Coreg, hydralazine,imdur.  Diuretics on hold secondary to problem #1.  Saline lock IV fluids.  Follow.  9.  Diabetes mellitus type 2 Patient not on insulin at home however on glyburide.  Hemoglobin A1c was 5.5 on 03/26/2017.  CBGs ranging from 95 -203.  Glyburide on hold.  Continue sliding scale insulin.   DVT prophylaxis: Heparin Code Status: DNR Family Communication: Updated son and patient at bedside.  Disposition Plan: Pending hospitalization.  Likely skilled nursing facility versus home with home health.   Consultants:   Nephrology: Dr. Justin Mend 05/01/2017  Cardiology pending  Procedures:   Chest x-ray 05/01/2017  VQ scan 05/02/2017  Lower extremity Dopplers pending  Antimicrobials:   None   Subjective: Patient in bed.  Patient states was able to get some sleep last night.  Patient still with shortness of breath on minimal exertion.  Denies any chest pain.   Objective: Vitals:   05/02/17 2330 05/03/17 0502 05/03/17 0819 05/03/17 0909  BP: (!) 173/53 (!) 149/38  (!) 149/46  Pulse: 65 64  67  Resp: 20 20  20   Temp: 98.2 F (36.8 C) (!) 97.4 F (36.3 C)  98 F (36.7 C)  TempSrc: Oral Oral  Oral  SpO2: 97% 99% 97% 96%  Weight:  67.2 kg (148 lb 3.2 oz)    Height:        Intake/Output Summary (Last 24 hours) at 05/03/2017 1106 Last data filed at 05/03/2017 0900 Gross per 24 hour  Intake 1681.67 ml  Output 1600 ml  Net 81.67 ml   Filed Weights   05/01/17 1521 05/02/17 2116 05/03/17 0502  Weight: 67 kg (147 lb 12.8 oz) 68 kg (150 lb) 67.2 kg (148 lb 3.2 oz)    Examination:  General exam: Appears calm and comfortable  Respiratory system: Clear  to auscultation.  No wheezes, no crackles, no rhonchi.  Respiratory effort normal. Cardiovascular system: Regular rate rhythm no murmurs rubs or gallops.  No JVD.  No lower extremity edema.  Gastrointestinal system: Abdomen is soft, nontender, nondistended, positive bowel sounds.  No rebound.  No guarding.  Central nervous system: Alert and oriented. No focal neurological deficits. Extremities: Symmetric 5 x 5 power. Skin: No rashes, lesions or ulcers Psychiatry: Judgement and insight appear normal. Mood & affect appropriate.     Data Reviewed: I have personally reviewed following labs and imaging studies  CBC: Recent Labs  Lab 05/01/17 1610 05/03/17 0515  WBC 5.5 6.0  NEUTROABS  --  4.1  HGB 9.3* 8.9*  HCT 28.7* 27.3*  MCV 98.6 98.2  PLT 136* 381*   Basic Metabolic Panel: Recent Labs  Lab 05/01/17 1610 05/02/17 0209 05/02/17 0440 05/03/17 0515  NA 125*  --  126* 129*  K 5.8* 5.0 5.0 4.5  CL 98*  --  98* 96*  CO2 17*  --  18* 23  GLUCOSE 173*  --  101* 106*  BUN 91*  --  94* 92*  CREATININE 3.07*  --  2.94* 2.32*  CALCIUM 8.8*  --  8.1* 8.7*  PHOS  --   --   --  3.9   GFR: Estimated Creatinine Clearance: 13.5 mL/min (A) (by C-G formula based on SCr of 2.32 mg/dL (H)). Liver Function Tests: Recent Labs  Lab 05/03/17 0515  ALBUMIN 3.0*   No results for input(s): LIPASE, AMYLASE in the last 168 hours. No results for input(s): AMMONIA in the last 168 hours. Coagulation Profile: No results for input(s): INR, PROTIME in the last 168 hours. Cardiac Enzymes: Recent Labs  Lab 05/02/17 2113 05/02/17 2323  TROPONINI <0.03 <0.03   BNP (last 3 results) No results for input(s): PROBNP in the last 8760 hours. HbA1C: No results for input(s): HGBA1C in the last 72 hours. CBG: Recent Labs  Lab 05/02/17 0805 05/02/17 1220 05/02/17 1822 05/02/17 2335 05/03/17 0746  GLUCAP 97 165* 97 160* 95   Lipid Profile: No results for input(s): CHOL, HDL, LDLCALC, TRIG,  CHOLHDL, LDLDIRECT in the last 72 hours. Thyroid Function Tests: No results for input(s): TSH, T4TOTAL, FREET4, T3FREE, THYROIDAB in the last 72 hours. Anemia Panel: No results for input(s): VITAMINB12, FOLATE, FERRITIN, TIBC, IRON, RETICCTPCT in the last 72 hours. Sepsis Labs: No results for input(s): PROCALCITON, LATICACIDVEN in the last 168 hours.  Recent Results (from the past 240 hour(s))  MRSA PCR Screening     Status: None   Collection Time: 05/02/17 10:04 PM  Result Value Ref Range Status   MRSA by PCR NEGATIVE NEGATIVE Final    Comment:        The GeneXpert MRSA Assay (FDA approved for NASAL specimens only), is one component of a comprehensive MRSA colonization surveillance program. It is not intended to diagnose MRSA infection nor to guide or monitor treatment for MRSA infections. Performed at [facility], [place] [address], [place], [place] [address]          Radiology Studies: Dg Chest 2 View  Result Date: 05/01/2017 CLINICAL DATA:  Worsening shortness of breath and weight gain over the last 4 days. EXAM: CHEST - 2 VIEW COMPARISON:  03/30/2017 FINDINGS: Chronic cardiomegaly and aortic atherosclerosis. Pulmonary venous hypertension. Small effusions with mild dependent atelectasis. No consolidation or lobar collapse. IMPRESSION: Cardiomegaly. Venous hypertension. Small effusions and mild dependent atelectasis. Electronically Signed   By: Nelson Chimes M.D.   On: 05/01/2017 16:41   Nm Pulmonary Perf And Vent  Result Date: 05/02/2017 CLINICAL DATA:  Shortness of breath EXAM: NUCLEAR MEDICINE VENTILATION - PERFUSION LUNG SCAN VIEWS: Anterior, posterior, left lateral, right lateral, RPO, LPO, RAO, LAO-ventilation and perfusion RADIOPHARMACEUTICALS:  31.2 mCi of Tc-47m DTPA aerosol inhalation and 4.15 mCi Tc11m-MAA IV COMPARISON:  Chest radiograph May 01, 2017 FINDINGS: Ventilation: Radiotracer uptake on the ventilation study shows patchy areas of decreased radiotracer  uptake in the right mid lung region. No well-defined segmental ventilation defects. Perfusion: Radiotracer uptake on the perfusion study is homogeneous and symmetric. There is no appreciable perfusion abnormality in the areas of relative photopenia in the right lung on the ventilation study. No appreciable perfusion defects evident. IMPRESSION: No appreciable perfusion defects. This study constitutes a very low probability of pulmonary embolus. Decreased radiotracer uptake in portions of the right mid lung on the ventilation study potentially may indicate a degree of underlying COPD involving portions of the right lung. Electronically Signed   By: Gwyndolyn Saxon  Jasmine December III M.D.   On: 05/02/2017 15:44        Scheduled Meds: . brinzolamide  1 drop Right Eye BID  . budesonide (PULMICORT) nebulizer solution  0.5 mg Nebulization BID  . carvedilol  6.25 mg Oral BID WC  . dicyclomine  10 mg Oral TID AC  . doxazosin  2 mg Oral QHS  . feeding supplement (ENSURE ENLIVE)  237 mL Oral BID BM  . ferrous sulfate  325 mg Oral Daily  . fluticasone  2 spray Each Nare BID  . guaiFENesin  600 mg Oral QHS  . heparin  5,000 Units Subcutaneous Q8H  . hydrALAZINE  100 mg Oral TID  . insulin aspart  0-9 Units Subcutaneous TID WC  . ipratropium-albuterol  3 mL Nebulization TID  . isosorbide mononitrate  240 mg Oral Daily  . latanoprost  1 drop Right Eye QHS  . montelukast  10 mg Oral QHS  . oxymetazoline  2 spray Each Nare BID  . pantoprazole  40 mg Oral Daily  . polyethylene glycol  17 g Oral Daily  . senna-docusate  1 tablet Oral BID  . sodium bicarbonate  650 mg Oral BID  . timolol  1 drop Right Eye BID  . traZODone  75 mg Oral QHS  . vitamin B-12  1,000 mcg Oral Daily  . vitamin C  500 mg Oral Daily   Continuous Infusions:    LOS: 2 days    Time spent: 35 minutes    Irine Seal, MD [facility] Pager [phone]  If 7PM-7AM, please contact night-coverage [email or web address] Password  [email or web address] 05/03/2017, 11:06 AM

## 2017-05-03 NOTE — Evaluation (Signed)
Physical Therapy Evaluation Patient Details Name: Kelly Ruiz MRN: [id number] DOB: Jan 14, 1924 Today's Date: 05/03/2017   History of Present Illness  Pt is a 84 y/o female admitted secondary to SOB and AKI. PMH including but not limited to diastolic CHF, CKD stage 4, HTN, DM2.    Clinical Impression  Pt presented supine in bed with HOB elevated, awake and willing to participate in therapy session. Prior to admission, pt reported that she was ambulating within her home with use of rollator and required assistance with ADLs. Pt currently limited secondary to fatigue; however, was able to tolerate ambulating within her room with RW and min guard for safety. Pt's son present throughout evaluation as well. PT recommending short-term rehab in a SNF prior to returning home to maximize her independence with functional mobility. Pt would continue to benefit from skilled physical therapy services at this time while admitted and after d/c to address the below listed limitations in order to improve overall safety and independence with functional mobility.     Follow Up Recommendations SNF;Supervision/Assistance - 24 hour    Equipment Recommendations  None recommended by PT    Recommendations for Other Services       Precautions / Restrictions Precautions Precautions: Fall Restrictions Weight Bearing Restrictions: No      Mobility  Bed Mobility Overal bed mobility: Needs Assistance Bed Mobility: Supine to Sit     Supine to sit: Supervision     General bed mobility comments: increased time and effort, use of bed rails, HOB elevated, supervision for safety  Transfers Overall transfer level: Needs assistance Equipment used: Rolling walker (2 wheeled) Transfers: Sit to/from Stand Sit to Stand: Min guard         General transfer comment: min guard for safety  Ambulation/Gait Ambulation/Gait assistance: Min guard Ambulation Distance (Feet): 30 Feet Assistive device: Rolling walker  (2 wheeled) Gait Pattern/deviations: Step-through pattern;Decreased step length - right;Decreased step length - left;Decreased stride length;Trunk flexed Gait velocity: decreased Gait velocity interpretation: <1.8 ft/sec, indicate of risk for recurrent falls General Gait Details: pt with mild instability but no overt LOB or need for physical assistance; distance limited secondary to fatigue.   Stairs            Wheelchair Mobility    Modified Rankin (Stroke Patients Only)       Balance Overall balance assessment: Needs assistance Sitting-balance support: Feet supported Sitting balance-Leahy Scale: Good     Standing balance support: During functional activity;Bilateral upper extremity supported Standing balance-Leahy Scale: Poor                               Pertinent Vitals/Pain Pain Assessment: No/denies pain    Home Living Family/patient expects to be discharged to:: Skilled nursing facility                 Additional Comments: pt with recent admission and d/c to SNF. pt's son reported that she has been home since 4/19 and was able to ambulate within her home with RW, and required assistance from an aide for ADLs    Prior Function Level of Independence: Needs assistance   Gait / Transfers Assistance Needed: ambulates with rollator  ADL's / Homemaking Assistance Needed: requires assistance from an aide for bathing and dressing        Hand Dominance        Extremity/Trunk Assessment   Upper Extremity Assessment Upper Extremity Assessment: Overall WFL for tasks  assessed    Lower Extremity Assessment Lower Extremity Assessment: Generalized weakness    Cervical / Trunk Assessment Cervical / Trunk Assessment: Kyphotic  Communication   Communication: No difficulties  Cognition Arousal/Alertness: Awake/alert Behavior During Therapy: WFL for tasks assessed/performed Overall Cognitive Status: Within Functional Limits for tasks assessed                                         General Comments      Exercises     Assessment/Plan    PT Assessment Patient needs continued PT services  PT Problem List Decreased activity tolerance;Decreased balance;Decreased mobility;Decreased coordination;Cardiopulmonary status limiting activity       PT Treatment Interventions DME instruction;Stair training;Gait training;Functional mobility training;Therapeutic activities;Therapeutic exercise;Balance training;Neuromuscular re-education;Patient/family education    PT Goals (Current goals can be found in the Care Plan section)  Acute Rehab PT Goals Patient Stated Goal: go to rehab to get stronger PT Goal Formulation: With patient/family Time For Goal Achievement: 05/17/17 Potential to Achieve Goals: Good    Frequency Min 2X/week   Barriers to discharge        Co-evaluation               AM-PAC PT "6 Clicks" Daily Activity  Outcome Measure Difficulty turning over in bed (including adjusting bedclothes, sheets and blankets)?: None Difficulty moving from lying on back to sitting on the side of the bed? : None Difficulty sitting down on and standing up from a chair with arms (e.g., wheelchair, bedside commode, etc,.)?: Unable Help needed moving to and from a bed to chair (including a wheelchair)?: A Little Help needed walking in hospital room?: A Little Help needed climbing 3-5 steps with a railing? : A Lot 6 Click Score: 17    End of Session Equipment Utilized During Treatment: Gait belt Activity Tolerance: Patient limited by fatigue Patient left: in chair;with call bell/phone within reach;with family/visitor present Nurse Communication: Mobility status PT Visit Diagnosis: Other abnormalities of gait and mobility (R26.89)    Time: 2458-0998 PT Time Calculation (min) (ACUTE ONLY): 24 min   Charges:   PT Evaluation $PT Eval Low Complexity: 1 Low PT Treatments $Therapeutic Activity: 8-22 mins   PT G  Codes:        [place], PT, DPT [place] 05/03/2017, 10:31 AM

## 2017-05-03 NOTE — Consult Note (Signed)
   [facility] CM Inpatient Consult   05/03/2017  Kelly Ruiz 12/29/1923 [id number]  Patient screened for high risk for unplanned readmissions in [place]. Patient has 24/7 caregivers and son had declined services. Medicare and from [place]. No needs identified for community follow up. Patient/family trying to decide for some short term rehab. For questions contact:   Natividad Brood, RN BSN [facility] Liaison  [phone] business mobile phone Toll free office [phone]

## 2017-05-03 NOTE — Plan of Care (Signed)
  Problem: Education: Goal: Knowledge of General Education information will improve Outcome: Progressing   Problem: Health Behavior/Discharge Planning: Goal: Ability to manage health-related needs will improve Outcome: Progressing   Problem: Clinical Measurements: Goal: Respiratory complications will improve Outcome: Progressing

## 2017-05-03 NOTE — [place] FL2 (Addendum)
[place] MEDICAID FL2 LEVEL OF CARE SCREENING TOOL     IDENTIFICATION  Patient Name: Kelly Ruiz Birthdate: [birth year] Sex: female Admission Date (Current Location): 05/01/2017  [facility] and [place] Number:  Herbalist and Address:  [facility], [place] [address] [place], [place] [address]      Provider Number: [id number]  Attending Physician Name and Address:  Eugenie Filler, MD  Relative Name and Phone Number:  Charie Pinkus, son, [phone]    Current Level of Care: Hospital Recommended Level of Care: [place] Prior Approval Number:    Date Approved/Denied:   PASRR Number: [id number]  Discharge Plan: SNF    Current Diagnoses: Patient Active Problem List   Diagnosis Date Noted  . Hyponatremia 05/02/2017  . Acute renal failure superimposed on stage 4 chronic kidney disease ([place]) 05/02/2017  . Weakness 05/02/2017  . Metabolic acidosis, NAG, failure of bicarbonate regeneration 05/01/2017  . AKI (acute kidney injury) ([place]) 03/26/2017  . CKD (chronic kidney disease), stage IV ([place]) 02/11/2017  . Acute on chronic diastolic HF (heart failure) ([place]) 12/05/2016  . Symptomatic anemia 12/04/2016  . Hyperkalemia 12/04/2016  . Degenerative disc disease, lumbar 11/05/2016  . Dyspnea, unspecified 04/10/2016  . Chronic renal insufficiency, stage IV (severe) ([place]) 11/08/2015  . Chest pain with moderate risk of acute coronary syndrome 11/08/2015  . DM (diabetes mellitus), type 2 with renal complications ([place]) 03/83/3383  . Chronic diastolic congestive heart failure ([place])   . Essential hypertension     Orientation RESPIRATION BLADDER Height & Weight     Self, Time, Situation, Place  Normal Incontinent, External catheter Weight: 148 lb 3.2 oz (67.2 kg) Height:  5' (152.4 cm)  BEHAVIORAL SYMPTOMS/MOOD NEUROLOGICAL BOWEL NUTRITION STATUS      Continent Diet(please see DC summary)  AMBULATORY STATUS COMMUNICATION OF NEEDS  Skin   Limited Assist Verbally Normal                       Personal Care Assistance Level of Assistance  Bathing, Feeding, Dressing Bathing Assistance: Limited assistance Feeding assistance: Independent Dressing Assistance: Limited assistance     Functional Limitations Info  Sight, Hearing, Speech Sight Info: Impaired Hearing Info: Impaired Speech Info: Adequate    SPECIAL CARE FACTORS FREQUENCY  PT (By licensed PT), OT (By licensed OT)     PT Frequency: 5x/week OT Frequency: 5x/week            Contractures Contractures Info: Not present    Additional Factors Info  Code Status, Allergies, Psychotropic, Insulin Sliding Scale Code Status Info: DNR Allergies Info: Actos Pioglitazone Hydrochloride, Felodipine Er, Gabapentin, Hctz Hydrochlorothiazide, Lisinopril, Plendil Felodipine, Amlodipine Besylate, Brimonidine Tartrate, Clonidine Derivatives, Combigan Brimonidine Tartrate-timolol, Dorzolamide, Monopril Fosinopril Sodium, Norvasc Amlodipine Besylate, Penicillins Psychotropic Info: trazadone Insulin Sliding Scale Info: novolog 3x/day with meals       Current Medications (05/03/2017):  This is the current hospital active medication list Current Facility-Administered Medications  Medication Dose Route Frequency Provider Last Rate Last Dose  . acetaminophen (TYLENOL) tablet 650 mg  650 mg Oral Q6H PRN Etta Quill, DO   650 mg at 05/03/17 0018   Or  . acetaminophen (TYLENOL) suppository 650 mg  650 mg Rectal Q6H PRN Etta Quill, DO      . brinzolamide (AZOPT) 1 % ophthalmic suspension 1 drop  1 drop Right Eye BID Jennette Kettle M, DO   1 drop at 05/03/17 1000  . budesonide (PULMICORT) nebulizer  solution 0.5 mg  0.5 mg Nebulization BID Eugenie Filler, MD   0.5 mg at 05/03/17 0819  . carvedilol (COREG) tablet 6.25 mg  6.25 mg Oral BID WC Jennette Kettle M, DO   6.25 mg at 05/03/17 1001  . dicyclomine (BENTYL) capsule 10 mg  10 mg Oral TID Gordy Savers M,  DO   10 mg at 05/03/17 1311  . doxazosin (CARDURA) tablet 2 mg  2 mg Oral QHS Jennette Kettle M, DO   2 mg at 05/03/17 0008  . feeding supplement (ENSURE ENLIVE) (ENSURE ENLIVE) liquid 237 mL  237 mL Oral BID BM Jennette Kettle M, DO   237 mL at 05/03/17 1000  . ferrous sulfate tablet 325 mg  325 mg Oral Daily Etta Quill, DO   325 mg at 05/03/17 1001  . fluticasone (FLONASE) 50 MCG/ACT nasal spray 2 spray  2 spray Each Nare BID Etta Quill, DO   2 spray at 05/03/17 1000  . guaiFENesin (MUCINEX) 12 hr tablet 600 mg  600 mg Oral QHS Jennette Kettle M, DO   600 mg at 05/03/17 0006  . heparin injection 5,000 Units  5,000 Units Subcutaneous Q8H Jennette Kettle M, DO   5,000 Units at 05/03/17 1358  . hydrALAZINE (APRESOLINE) tablet 100 mg  100 mg Oral TID Etta Quill, DO   100 mg at 05/03/17 1002  . insulin aspart (novoLOG) injection 0-9 Units  0-9 Units Subcutaneous TID WC Etta Quill, DO   3 Units at 05/03/17 1212  . ipratropium-albuterol (DUONEB) 0.5-2.5 (3) MG/3ML nebulizer solution 3 mL  3 mL Nebulization Q2H PRN Eugenie Filler, MD      . ipratropium-albuterol (DUONEB) 0.5-2.5 (3) MG/3ML nebulizer solution 3 mL  3 mL Nebulization Q6H PRN Eugenie Filler, MD      . ipratropium-albuterol (DUONEB) 0.5-2.5 (3) MG/3ML nebulizer solution 3 mL  3 mL Nebulization BID Milly Jakob, MD      . isosorbide mononitrate (IMDUR) 24 hr tablet 240 mg  240 mg Oral Daily Jennette Kettle M, DO   240 mg at 05/03/17 1001  . latanoprost (XALATAN) 0.005 % ophthalmic solution 1 drop  1 drop Right Eye QHS Jennette Kettle M, DO   1 drop at 05/03/17 0018  . montelukast (SINGULAIR) tablet 10 mg  10 mg Oral QHS Jennette Kettle M, DO   10 mg at 05/03/17 0008  . ondansetron (ZOFRAN) tablet 4 mg  4 mg Oral Q6H PRN Etta Quill, DO       Or  . ondansetron [facility]) injection 4 mg  4 mg Intravenous Q6H PRN Etta Quill, DO      . oxymetazoline (AFRIN) 0.05 % nasal spray 2 spray  2 spray Each Nare BID  Etta Quill, DO   2 spray at 05/03/17 1000  . pantoprazole (PROTONIX) EC tablet 40 mg  40 mg Oral Daily Jennette Kettle M, DO   40 mg at 05/03/17 1001  . polyethylene glycol (MIRALAX / GLYCOLAX) packet 17 g  17 g Oral Daily Eugenie Filler, MD      . senna-docusate (Senokot-S) tablet 1 tablet  1 tablet Oral BID Eugenie Filler, MD   1 tablet at 05/03/17 1001  . sodium bicarbonate tablet 650 mg  650 mg Oral BID Eugenie Filler, MD   650 mg at 05/03/17 1001  . timolol (TIMOPTIC) 0.5 % ophthalmic solution 1 drop  1 drop Right Eye BID Etta Quill, DO  1 drop at 05/03/17 1000  . traZODone (DESYREL) tablet 75 mg  75 mg Oral QHS Jennette Kettle M, DO   75 mg at 05/03/17 0006  . vitamin B-12 (CYANOCOBALAMIN) tablet 1,000 mcg  1,000 mcg Oral Daily Jennette Kettle M, DO   1,000 mcg at 05/03/17 1001  . vitamin C (ASCORBIC ACID) tablet 500 mg  500 mg Oral Daily Jennette Kettle M, DO   500 mg at 05/03/17 1001     Discharge Medications: Please see discharge summary for a list of discharge medications.  Relevant Imaging Results:  Relevant Lab Results:   Additional Information SSN: [id number]  Estanislado Emms, LCSW

## 2017-05-04 ENCOUNTER — Inpatient Hospital Stay (HOSPITAL_COMMUNITY): Payer: Medicare Other

## 2017-05-04 DIAGNOSIS — M7989 Other specified soft tissue disorders: Secondary | ICD-10-CM

## 2017-05-04 DIAGNOSIS — N184 Chronic kidney disease, stage 4 (severe): Secondary | ICD-10-CM

## 2017-05-04 LAB — BASIC METABOLIC PANEL
ANION GAP: 9 (ref 5–15)
BUN: 82 mg/dL — ABNORMAL HIGH (ref 6–20)
CHLORIDE: 99 mmol/L — AB (ref 101–111)
CO2: 23 mmol/L (ref 22–32)
Calcium: 8.8 mg/dL — ABNORMAL LOW (ref 8.9–10.3)
Creatinine, Ser: 2.2 mg/dL — ABNORMAL HIGH (ref 0.44–1.00)
GFR calc Af Amer: 21 mL/min — ABNORMAL LOW (ref >60)
GFR, EST NON AFRICAN AMERICAN: 18 mL/min — AB (ref >60)
Glucose, Bld: 118 mg/dL — ABNORMAL HIGH (ref 65–99)
POTASSIUM: 5 mmol/L (ref 3.5–5.1)
SODIUM: 131 mmol/L — AB (ref 135–145)

## 2017-05-04 LAB — CBC
HCT: 25.8 % — ABNORMAL LOW (ref 36.0–46.0)
HEMOGLOBIN: 8.5 g/dL — AB (ref 12.0–15.0)
MCH: 32.9 pg (ref 26.0–34.0)
MCHC: 32.9 g/dL (ref 30.0–36.0)
MCV: 100 fL (ref 78.0–100.0)
PLATELETS: 121 10*3/uL — AB (ref 150–400)
RBC: 2.58 MIL/uL — AB (ref 3.87–5.11)
RDW: 13 % (ref 11.5–15.5)
WBC: 6.7 10*3/uL (ref 4.0–10.5)

## 2017-05-04 LAB — GLUCOSE, CAPILLARY
GLUCOSE-CAPILLARY: 111 mg/dL — AB (ref 65–99)
GLUCOSE-CAPILLARY: 240 mg/dL — AB (ref 65–99)
GLUCOSE-CAPILLARY: 184 mg/dL — AB (ref 65–99)
Glucose-Capillary: 137 mg/dL — ABNORMAL HIGH (ref 65–99)

## 2017-05-04 MED ORDER — PROSIGHT PO TABS
ORAL_TABLET | Freq: Two times a day (BID) | ORAL | Status: DC
  Administered 2017-05-04 – 2017-05-09 (×11): 1 via ORAL
  Filled 2017-05-04 (×12): qty 1

## 2017-05-04 MED ORDER — FUROSEMIDE 40 MG PO TABS
40.0000 mg | ORAL_TABLET | Freq: Every day | ORAL | Status: DC
  Administered 2017-05-04 – 2017-05-09 (×6): 40 mg via ORAL
  Filled 2017-05-04 (×6): qty 1

## 2017-05-04 NOTE — Progress Notes (Signed)
  Dr Cherlyn Cushing consult note reviewed,active issues we are following summarized below. We have deferred diuretic dosing to nephorology, currentlty on hold. Really no other cardiac interventions planned for today. We will monitor over the weekend but continue to defer her volume status management to nephrology.      1. AKI on CKD IV - from notes thought to be due to combination of diuretics and bactrim. Prerenal etiology - Cr trended down with IVFs. Down from 3 to 2.2.  - management per nephrology  2. Acute on chronic diasotlic HF - from notes up 4-5 lbs on admission - management difficult due to her advanced renal dysfunciton, overall fraility and sensitivity to medications    For questions or updates, please contact [place] Please consult [email or web address] for contact info under Cardiology/STEMI.      Merrily Pew, MD  05/04/2017, 9:15 AM

## 2017-05-04 NOTE — Plan of Care (Signed)
  Problem: Education: Goal: Knowledge of General Education information will improve Outcome: Progressing   Problem: Health Behavior/Discharge Planning: Goal: Ability to manage health-related needs will improve Outcome: Progressing   Problem: Clinical Measurements: Goal: Ability to maintain clinical measurements within normal limits will improve Outcome: Progressing   Problem: Clinical Measurements: Goal: Respiratory complications will improve Outcome: Progressing   Problem: Activity: Goal: Risk for activity intolerance will decrease Outcome: Progressing   Problem: Nutrition: Goal: Adequate nutrition will be maintained Outcome: Progressing

## 2017-05-04 NOTE — Progress Notes (Signed)
VASCULAR LAB PRELIMINARY  PRELIMINARY  PRELIMINARY  PRELIMINARY  Bilateral lower extremity venous duplex completed.    Preliminary report:  There is no DVT or SVT noted in the bilateral lower extremities.   Clem Wisenbaker, RVT 05/04/2017, 3:49 PM

## 2017-05-04 NOTE — Progress Notes (Signed)
Kelly Ruiz Progress Note    Assessment/ Plan:   1.  AKI on CKD III/VI: likely due to vol depletion and Bactrim.  This has been stopped.  Her Cr is downtrending and her UOP is picking up.  There are no indications for dialysis at present.  I'm going to restart her Lasix at 40 mg daily today.  Due to her pulmonary HTN and CHF she has cardiorenal syndrome and volume status is difficult to manage.  I expect her creatinine to fluctuate based on the administration of diuretics.  Her Cr is baseline 1.6-1.9 but has been as high as 3.9 in March 2019.  She's a very poor dialysis candidate due to debility and age and would be best to try to get her at a euvolemic state as possible with diuretics.  2.  Hyponatremia: improved, restarting Lasix today  3.  Metabolic acidosis: improving on bicarb gtt-->  Transitioned to PO  4.  Urinary urgency: Foley in place,  UA negative, don't need to restart abx for now  5.  Pulmonary HTN: PA pressures 50.  Management of volume as above  6.  Hyperkalemia: resolved.  Likely due to Bactrim.  7.  Dispo: pending.  If pt's breathing doesn't improve with administration of diuretics and restoration of euvolemia, would consider pall care c/s.   Subjective:    Feeling more SOB today.  Weights are up.  Cr improving.  Discussion with pt's son- he notes that she's had her Lasix dosing changed 3x in the last 3 weeks.    Objective:   BP (!) 154/59 (BP Location: Left Arm)   Pulse 80   Temp 98.6 F (37 C) (Oral)   Resp 20   Ht 5' (1.524 m)   Wt 67.2 kg (148 lb 3.2 oz) Comment: scale a  SpO2 93%   BMI 28.94 kg/m   Intake/Output Summary (Last 24 hours) at 05/04/2017 1248 Last data filed at 05/04/2017 1049 Gross per 24 hour  Intake 1430 ml  Output 900 ml  Net 530 ml   Weight change: -0.816 kg (-1 lb 12.8 oz)  Physical Exam: GEN frail, elderly, NAD HEENT EOMI PERRL NECK+ JVD at 30 degrees PULM no O2, bibasilar crackles CV RRR ABD soft,  nontender EXT trace LE edema  Imaging: Nm Pulmonary Perf And Vent  Result Date: 05/02/2017 CLINICAL DATA:  Shortness of breath EXAM: NUCLEAR MEDICINE VENTILATION - PERFUSION LUNG SCAN VIEWS: Anterior, posterior, left lateral, right lateral, RPO, LPO, RAO, LAO-ventilation and perfusion RADIOPHARMACEUTICALS:  31.2 mCi of Tc-70m DTPA aerosol inhalation and 4.15 mCi Tc53m-MAA IV COMPARISON:  Chest radiograph May 01, 2017 FINDINGS: Ventilation: Radiotracer uptake on the ventilation study shows patchy areas of decreased radiotracer uptake in the right mid lung region. No well-defined segmental ventilation defects. Perfusion: Radiotracer uptake on the perfusion study is homogeneous and symmetric. There is no appreciable perfusion abnormality in the areas of relative photopenia in the right lung on the ventilation study. No appreciable perfusion defects evident. IMPRESSION: No appreciable perfusion defects. This study constitutes a very low probability of pulmonary embolus. Decreased radiotracer uptake in portions of the right mid lung on the ventilation study potentially may indicate a degree of underlying COPD involving portions of the right lung. Electronically Signed   By: Lowella Grip III M.D.   On: 05/02/2017 15:44    Labs: BMET Recent Labs  Lab 05/01/17 1610 05/02/17 0209 05/02/17 0440 05/03/17 0515 05/04/17 0516  NA 125*  --  126* 129* 131*  K  5.8* 5.0 5.0 4.5 5.0  CL 98*  --  98* 96* 99*  CO2 17*  --  18* 23 23  GLUCOSE 173*  --  101* 106* 118*  BUN 91*  --  94* 92* 82*  CREATININE 3.07*  --  2.94* 2.32* 2.20*  CALCIUM 8.8*  --  8.1* 8.7* 8.8*  PHOS  --   --   --  3.9  --    CBC Recent Labs  Lab 05/01/17 1610 05/03/17 0515 05/04/17 0516  WBC 5.5 6.0 6.7  NEUTROABS  --  4.1  --   HGB 9.3* 8.9* 8.5*  HCT 28.7* 27.3* 25.8*  MCV 98.6 98.2 100.0  PLT 136* 136* 121*    Medications:    . brinzolamide  1 drop Right Eye BID  . budesonide (PULMICORT) nebulizer solution  0.5  mg Nebulization BID  . carvedilol  6.25 mg Oral BID WC  . dicyclomine  10 mg Oral TID AC  . doxazosin  2 mg Oral QHS  . feeding supplement (ENSURE ENLIVE)  237 mL Oral BID BM  . ferrous sulfate  325 mg Oral Daily  . fluticasone  2 spray Each Nare BID  . furosemide  40 mg Oral Daily  . guaiFENesin  600 mg Oral QHS  . heparin  5,000 Units Subcutaneous Q8H  . hydrALAZINE  100 mg Oral TID  . insulin aspart  0-9 Units Subcutaneous TID WC  . ipratropium-albuterol  3 mL Nebulization BID  . isosorbide mononitrate  240 mg Oral Daily  . latanoprost  1 drop Right Eye QHS  . montelukast  10 mg Oral QHS  . multivitamin   Oral BID  . oxymetazoline  2 spray Each Nare BID  . pantoprazole  40 mg Oral Daily  . polyethylene glycol  17 g Oral Daily  . senna-docusate  1 tablet Oral BID  . sodium bicarbonate  650 mg Oral BID  . timolol  1 drop Right Eye BID  . traZODone  75 mg Oral QHS  . vitamin B-12  1,000 mcg Oral Daily  . vitamin C  500 mg Oral Daily     Madelon Lips MD [facility] pgr [phone] 05/04/2017, 12:48 PM

## 2017-05-04 NOTE — Progress Notes (Signed)
PROGRESS NOTE    Kelly Ruiz  CXK:[id number] DOB: [birth year] DOA: 05/01/2017 PCP: Ronita Hipps, MD    Brief Narrative:  84 y.o.femalewith medical history significant ofdiastolic CHF, CKD stage 4, HTN, DM2. Patient recently admitted to hospitalist service from 3/12-3/21 for AKI believed to be pre-renal and diuretic induced. She was initially hydrated until 3/16 with improving kidney function, then developed acute on chronic diastolic CHF due to fluid overload. She was diuresed from that point on until discharge. Creat at discharge was 1.48 down from 3.9 on admission. It does not appear that nephrology was consulted at that time. CT at that time showed large bowel stool with atrophic kidneys She was quite anemic during the hospitalization and it appeared that she was iron deficient. She was discharged off her ARB and was given a regimen of lasix based on weight.  She was discharged from the rehab facility last week and her son noted and increase in weight and dyspnea  He gave her the extra lasix as prescribed but brought her to the hospital as she was getting weaker and had worsening dyspnea. Her labs in the ER showed K 5.8  Na 125  CO2 17  BUN 91  Cr 3.07  Ca 8.8  Glc 173         Assessment & Plan:   Principal Problem:   Acute renal failure superimposed on stage 4 chronic kidney disease (HCC) Active Problems:   Chronic diastolic congestive heart failure (HCC)   Essential hypertension   DM (diabetes mellitus), type 2 with renal complications (HCC)   Dyspnea, unspecified   Hyperkalemia   Acute on chronic diastolic HF (heart failure) (HCC)   CKD (chronic kidney disease), stage IV (HCC)   AKI (acute kidney injury) (HCC)   Metabolic acidosis, NAG, failure of bicarbonate regeneration   Hyponatremia   Weakness  1 acute on chronic kidney disease stage IV baseline creatinine 1.6-1.9 Questionable etiology.  Felt likely secondary to a prerenal azotemia as patient noted to be on  diuretics and also recent Bactrim use.  Patient initially did not look volume overloaded on examination.  BNP was elevated but not significantly as during last hospitalization.  Patient was given some boluses of normal saline in the ED. Patient was gently hydrated for 48 hours and IV fluids have been saline locked. Patient with a urine output of 1.2 L over the past 24 hours.  Renal function trending down.  There is to be started on Lasix 40 mg daily per nephrology.  Follow renal function closely and urine output.  Nephrology patient a very poor dialysis candidate due to debility and age.  Nephrology following and appreciate input and recommendations.   2.  Dyspnea Patient significantly dyspneic on minimal exertion per patient and per RN.  Patient even short of breath while taking multiple medications and on minimal exertion.  Patient denies any chest pain.  Patient may be in acute on chronic diastolic heart failure as weight today is 148 pounds.  Weight from last cardiology office visit in January 2019 was 139 pounds.  Initially on admission it was felt patient was not volume overloaded.  Chest x-ray negative for any pulmonary edema.  May be secondary to worsening renal function versus cardiac etiology versus pulmonary etiology.  Cardiac enzymes negative x3.   Recent 2D echo 03/31/2017 with a EF of 60-65%, no wall motion abnormalities, grade 2 diastolic dysfunction.  Moderate mitral valvular regurgitation.  Moderately dilated left atrium.  Systolic pressure moderately increased with a  PA peak pressure of 50 mmHg.  VQ scan with low probability for PE.  Lower extremity Dopplers pending.  ABG was done which was unremarkable.  Patient seen by cardiology who do not feel patient shortness of breath is from valvular dysfunction.  Feels may be secondary to acute on chronic heart failure in the setting of acute on chronic kidney disease.  Difficult situation and delicate balance with fluid status.  Cardiology deferred  diuretic dosing to nephrology.  Patient to be started on Lasix 40 mg daily.  Cardiology following and appreciate input and recommendations.   3.  Hypertension Continue current regimen of Coreg, Cardura, hydralazine, imdur.  Patient to be started on Lasix 40 mg today per nephrology.  4.  Hyperkalemia Likely secondary to acute on chronic renal failure.  Patient given some Kayexalate as well as IV bicarb with resolution of hyperkalemia.  Potassium level at 5.0.  Continue renal diet.  Nephrology following.  5.  Weakness Questionable etiology.  May be secondary to problem #1.  PT/OT.  6.  Hyponatremia Questionable etiology.  Likely secondary to hypovolemic hyponatremia. Sodium levels improved with gentle hydration.  TSH pending.  Sodium 62. Urine creatinine 20.83.  Urine osmolality was 273.  IV fluids have been saline locked.  Follow.  Per nephrology.  7.  Acidosis Likely secondary to problem #1.  Patient placed on bicarb tablets twice daily with improvement.  Follow.  Per nephrology.  8.  ?? Acute on diastolic heart failure Patient with some shortness of breath on minimal exertion.  Concern for possible acute on chronic diastolic heart failure.  Patient's current rate is 148 pounds.  Weight from the office on 02/11/2017 was 139 pounds.  Patient on presentation did not look volume overloaded and looked dry.  Patient was hydrated gently with IV fluids.  IV fluids have been discontinued.  Chest x-ray negative for any pulmonary edema.  BNP in the 500s compared to 1800s during recent prior hospitalization.  Continue Coreg, hydralazine,imdur.  Diuretics on hold secondary to problem #1.  Lasix to be resumed at 40 mg daily per nephrology today and renal function to be monitored closely.  Follow.  9.  Diabetes mellitus type 2 Patient not on insulin at home however on glyburide.  Hemoglobin A1c was 5.5 on 03/26/2017.  CBGs ranging from 111 -240.  Glyburide on hold.  Continue sliding scale insulin.   DVT  prophylaxis: Heparin Code Status: DNR Family Communication: Updated son via telephone and patient.   Disposition Plan: Pending hospitalization.  Likely skilled nursing facility versus home with home health.   Consultants:   Nephrology: Dr. Justin Mend 05/01/2017  Cardiology pending  Procedures:   Chest x-ray 05/01/2017  VQ scan 05/02/2017  Lower extremity Dopplers pending  Antimicrobials:   None   Subjective: Patient in bed.  Patient states still SOB with minimal exertion.  Patient denies any chest pain.   Objective: Vitals:   05/04/17 0625 05/04/17 0746 05/04/17 0937 05/04/17 1223  BP: (!) 177/54  (!) 172/45 (!) 154/59  Pulse:   73 80  Resp:   16 20  Temp:      TempSrc:      SpO2:  99% 98% 93%  Weight:      Height:        Intake/Output Summary (Last 24 hours) at 05/04/2017 1523 Last data filed at 05/04/2017 1423 Gross per 24 hour  Intake 1190 ml  Output 900 ml  Net 290 ml   Filed Weights   05/02/17 2116 05/03/17 0502  05/04/17 0538  Weight: 68 kg (150 lb) 67.2 kg (148 lb 3.2 oz) 67.2 kg (148 lb 3.2 oz)    Examination:  General exam: NAD Respiratory system: CTAB.  No wheezes, no crackles, no rhonchi.  Respiratory effort normal. Cardiovascular system: RRR no m/r/g. No lower extremity edema.  Gastrointestinal system: Abdomen is NT/ND/+BS. No rebound, no guarding.  Central nervous system: Alert and oriented. No focal neurological deficits. Extremities: Symmetric 5 x 5 power. Skin: No rashes, lesions or ulcers Psychiatry: Judgement and insight appear normal. Mood & affect appropriate.     Data Reviewed: I have personally reviewed following labs and imaging studies  CBC: Recent Labs  Lab 05/01/17 1610 05/03/17 0515 05/04/17 0516  WBC 5.5 6.0 6.7  NEUTROABS  --  4.1  --   HGB 9.3* 8.9* 8.5*  HCT 28.7* 27.3* 25.8*  MCV 98.6 98.2 100.0  PLT 136* 136* 500*   Basic Metabolic Panel: Recent Labs  Lab 05/01/17 1610 05/02/17 0209 05/02/17 0440 05/03/17 0515  05/04/17 0516  NA 125*  --  126* 129* 131*  K 5.8* 5.0 5.0 4.5 5.0  CL 98*  --  98* 96* 99*  CO2 17*  --  18* 23 23  GLUCOSE 173*  --  101* 106* 118*  BUN 91*  --  94* 92* 82*  CREATININE 3.07*  --  2.94* 2.32* 2.20*  CALCIUM 8.8*  --  8.1* 8.7* 8.8*  PHOS  --   --   --  3.9  --    GFR: Estimated Creatinine Clearance: 14.3 mL/min (A) (by C-G formula based on SCr of 2.2 mg/dL (H)). Liver Function Tests: Recent Labs  Lab 05/03/17 0515  ALBUMIN 3.0*   No results for input(s): LIPASE, AMYLASE in the last 168 hours. No results for input(s): AMMONIA in the last 168 hours. Coagulation Profile: No results for input(s): INR, PROTIME in the last 168 hours. Cardiac Enzymes: Recent Labs  Lab 05/02/17 2113 05/02/17 2323  TROPONINI <0.03 <0.03   BNP (last 3 results) No results for input(s): PROBNP in the last 8760 hours. HbA1C: No results for input(s): HGBA1C in the last 72 hours. CBG: Recent Labs  Lab 05/03/17 1116 05/03/17 1652 05/03/17 2046 05/04/17 0731 05/04/17 1142  GLUCAP 203* 168* 118* 111* 240*   Lipid Profile: No results for input(s): CHOL, HDL, LDLCALC, TRIG, CHOLHDL, LDLDIRECT in the last 72 hours. Thyroid Function Tests: No results for input(s): TSH, T4TOTAL, FREET4, T3FREE, THYROIDAB in the last 72 hours. Anemia Panel: No results for input(s): VITAMINB12, FOLATE, FERRITIN, TIBC, IRON, RETICCTPCT in the last 72 hours. Sepsis Labs: No results for input(s): PROCALCITON, LATICACIDVEN in the last 168 hours.  Recent Results (from the past 240 hour(s))  MRSA PCR Screening     Status: None   Collection Time: 05/02/17 10:04 PM  Result Value Ref Range Status   MRSA by PCR NEGATIVE NEGATIVE Final    Comment:        The GeneXpert MRSA Assay (FDA approved for NASAL specimens only), is one component of a comprehensive MRSA colonization surveillance program. It is not intended to diagnose MRSA infection nor to guide or monitor treatment for MRSA  infections. Performed at [facility], [place] [address], [place], [place] [address]          Radiology Studies: Nm Pulmonary Perf And Vent  Result Date: 05/02/2017 CLINICAL DATA:  Shortness of breath EXAM: NUCLEAR MEDICINE VENTILATION - PERFUSION LUNG SCAN VIEWS: Anterior, posterior, left lateral, right lateral, RPO, LPO,  RAO, LAO-ventilation and perfusion RADIOPHARMACEUTICALS:  31.2 mCi of Tc-50m DTPA aerosol inhalation and 4.15 mCi Tc74m-MAA IV COMPARISON:  Chest radiograph May 01, 2017 FINDINGS: Ventilation: Radiotracer uptake on the ventilation study shows patchy areas of decreased radiotracer uptake in the right mid lung region. No well-defined segmental ventilation defects. Perfusion: Radiotracer uptake on the perfusion study is homogeneous and symmetric. There is no appreciable perfusion abnormality in the areas of relative photopenia in the right lung on the ventilation study. No appreciable perfusion defects evident. IMPRESSION: No appreciable perfusion defects. This study constitutes a very low probability of pulmonary embolus. Decreased radiotracer uptake in portions of the right mid lung on the ventilation study potentially may indicate a degree of underlying COPD involving portions of the right lung. Electronically Signed   By: Lowella Grip III M.D.   On: 05/02/2017 15:44        Scheduled Meds: . brinzolamide  1 drop Right Eye BID  . budesonide (PULMICORT) nebulizer solution  0.5 mg Nebulization BID  . carvedilol  6.25 mg Oral BID WC  . dicyclomine  10 mg Oral TID AC  . doxazosin  2 mg Oral QHS  . feeding supplement (ENSURE ENLIVE)  237 mL Oral BID BM  . ferrous sulfate  325 mg Oral Daily  . fluticasone  2 spray Each Nare BID  . furosemide  40 mg Oral Daily  . guaiFENesin  600 mg Oral QHS  . heparin  5,000 Units Subcutaneous Q8H  . hydrALAZINE  100 mg Oral TID  . insulin aspart  0-9 Units Subcutaneous TID WC  . ipratropium-albuterol  3 mL Nebulization BID  .  isosorbide mononitrate  240 mg Oral Daily  . latanoprost  1 drop Right Eye QHS  . montelukast  10 mg Oral QHS  . multivitamin   Oral BID  . oxymetazoline  2 spray Each Nare BID  . pantoprazole  40 mg Oral Daily  . polyethylene glycol  17 g Oral Daily  . senna-docusate  1 tablet Oral BID  . sodium bicarbonate  650 mg Oral BID  . timolol  1 drop Right Eye BID  . traZODone  75 mg Oral QHS  . vitamin B-12  1,000 mcg Oral Daily  . vitamin C  500 mg Oral Daily   Continuous Infusions:    LOS: 3 days    Time spent: 35 minutes    Irine Seal, MD [facility] Pager [phone]  If 7PM-7AM, please contact night-coverage [email or web address] Password [facility] 05/04/2017, 3:23 PM

## 2017-05-05 DIAGNOSIS — I5033 Acute on chronic diastolic (congestive) heart failure: Secondary | ICD-10-CM

## 2017-05-05 LAB — RENAL FUNCTION PANEL
Albumin: 2.9 g/dL — ABNORMAL LOW (ref 3.5–5.0)
Anion gap: 8 (ref 5–15)
BUN: 76 mg/dL — ABNORMAL HIGH (ref 6–20)
CHLORIDE: 99 mmol/L — AB (ref 101–111)
CO2: 25 mmol/L (ref 22–32)
CREATININE: 2.07 mg/dL — AB (ref 0.44–1.00)
Calcium: 9.2 mg/dL (ref 8.9–10.3)
GFR, EST AFRICAN AMERICAN: 23 mL/min — AB (ref >60)
GFR, EST NON AFRICAN AMERICAN: 20 mL/min — AB (ref >60)
Glucose, Bld: 126 mg/dL — ABNORMAL HIGH (ref 65–99)
POTASSIUM: 4.8 mmol/L (ref 3.5–5.1)
Phosphorus: 3.7 mg/dL (ref 2.5–4.6)
Sodium: 132 mmol/L — ABNORMAL LOW (ref 135–145)

## 2017-05-05 LAB — GLUCOSE, CAPILLARY
GLUCOSE-CAPILLARY: 121 mg/dL — AB (ref 65–99)
Glucose-Capillary: 159 mg/dL — ABNORMAL HIGH (ref 65–99)
Glucose-Capillary: 207 mg/dL — ABNORMAL HIGH (ref 65–99)
Glucose-Capillary: 202 mg/dL — ABNORMAL HIGH (ref 65–99)

## 2017-05-05 LAB — CBC
HEMATOCRIT: 27.1 % — AB (ref 36.0–46.0)
Hemoglobin: 8.7 g/dL — ABNORMAL LOW (ref 12.0–15.0)
MCH: 32.1 pg (ref 26.0–34.0)
MCHC: 32.1 g/dL (ref 30.0–36.0)
MCV: 100 fL (ref 78.0–100.0)
Platelets: 126 10*3/uL — ABNORMAL LOW (ref 150–400)
RBC: 2.71 MIL/uL — AB (ref 3.87–5.11)
RDW: 13 % (ref 11.5–15.5)
WBC: 7.3 10*3/uL (ref 4.0–10.5)

## 2017-05-05 LAB — TSH: TSH: 2.437 u[IU]/mL (ref 0.350–4.500)

## 2017-05-05 NOTE — Progress Notes (Signed)
[facility] Progress Note    Assessment/ Plan:   1.  AKI on CKD III/VI: likely due to vol depletion and Bactrim.  This has been stopped.  Her Cr is downtrending and her UOP is picking up.  There are no indications for dialysis at present.  Due to her pulmonary HTN and CHF she has cardiorenal syndrome and volume status is difficult to manage.  I expect her creatinine to fluctuate based on the administration of diuretics.  Her Cr is baseline 1.6-1.9 but has been as high as 3.9 in March 2019.  She's a very poor dialysis candidate due to debility and age and would be best to try to get her at a euvolemic state as possible with diuretics.  Continue Lasix 40 mg daily.  2.  Hyponatremia: improved,   3.  Metabolic acidosis: improving on bicarb gtt-->  Transitioned to PO  4.  Urinary urgency: Foley in place,  UA negative, don't need to restart abx for now  5.  Pulmonary HTN: PA pressures 50.  Management of volume as above  6.  Hyperkalemia: resolved.  Likely due to Bactrim.  7.  Dispo: pending.  If pt's breathing doesn't improve with administration of diuretics and restoration of euvolemia, would consider pall care c/s.   Subjective:    Restarted Lasix yesterday with some improvement in SOB today but still having some in the AM.  Good UOP.      Objective:   BP (!) 136/59 (BP Location: Right Arm)   Pulse (!) 59   Temp 98.1 F (36.7 C) (Oral)   Resp 20   Ht 5' (1.524 m)   Wt 66.8 kg (147 lb 3.2 oz) Comment: scale a  SpO2 100%   BMI 28.75 kg/m   Intake/Output Summary (Last 24 hours) at 05/05/2017 1213 Last data filed at 05/05/2017 0919 Gross per 24 hour  Intake 960 ml  Output 1701 ml  Net -741 ml   Weight change: -0.454 kg (-1 lb)  Physical Exam: GEN frail, elderly, NAD HEENT EOMI PERRL NECK+ JVD at 30 degrees PULM no O2, bibasilar crackles persist CV RRR ABD soft, nontender EXT trace LE edema  Imaging: No results found.  Labs: BMET Recent Labs  Lab  05/01/17 1610 05/02/17 0209 05/02/17 0440 05/03/17 0515 05/04/17 0516 05/05/17 0814  NA 125*  --  126* 129* 131* 132*  K 5.8* 5.0 5.0 4.5 5.0 4.8  CL 98*  --  98* 96* 99* 99*  CO2 17*  --  18* 23 23 25   GLUCOSE 173*  --  101* 106* 118* 126*  BUN 91*  --  94* 92* 82* 76*  CREATININE 3.07*  --  2.94* 2.32* 2.20* 2.07*  CALCIUM 8.8*  --  8.1* 8.7* 8.8* 9.2  PHOS  --   --   --  3.9  --  3.7   CBC Recent Labs  Lab 05/01/17 1610 05/03/17 0515 05/04/17 0516 05/05/17 0814  WBC 5.5 6.0 6.7 7.3  NEUTROABS  --  4.1  --   --   HGB 9.3* 8.9* 8.5* 8.7*  HCT 28.7* 27.3* 25.8* 27.1*  MCV 98.6 98.2 100.0 100.0  PLT 136* 136* 121* 126*    Medications:    . brinzolamide  1 drop Right Eye BID  . budesonide (PULMICORT) nebulizer solution  0.5 mg Nebulization BID  . carvedilol  6.25 mg Oral BID WC  . dicyclomine  10 mg Oral TID AC  . doxazosin  2 mg Oral QHS  .  feeding supplement (ENSURE ENLIVE)  237 mL Oral BID BM  . ferrous sulfate  325 mg Oral Daily  . fluticasone  2 spray Each Nare BID  . furosemide  40 mg Oral Daily  . guaiFENesin  600 mg Oral QHS  . heparin  5,000 Units Subcutaneous Q8H  . hydrALAZINE  100 mg Oral TID  . insulin aspart  0-9 Units Subcutaneous TID WC  . ipratropium-albuterol  3 mL Nebulization BID  . isosorbide mononitrate  240 mg Oral Daily  . latanoprost  1 drop Right Eye QHS  . montelukast  10 mg Oral QHS  . multivitamin   Oral BID  . oxymetazoline  2 spray Each Nare BID  . pantoprazole  40 mg Oral Daily  . polyethylene glycol  17 g Oral Daily  . senna-docusate  1 tablet Oral BID  . sodium bicarbonate  650 mg Oral BID  . timolol  1 drop Right Eye BID  . traZODone  75 mg Oral QHS  . vitamin B-12  1,000 mcg Oral Daily  . vitamin C  500 mg Oral Daily     Madelon Lips MD [facility] pgr [phone] 05/05/2017, 12:13 PM

## 2017-05-05 NOTE — Plan of Care (Signed)
  Problem: Clinical Measurements: Goal: Respiratory complications will improve Outcome: Progressing   Problem: Activity: Goal: Risk for activity intolerance will decrease Outcome: Progressing   

## 2017-05-05 NOTE — Progress Notes (Signed)
Patient doing much better over the night, but still having SOB with minimal exertion.

## 2017-05-05 NOTE — Progress Notes (Signed)
PROGRESS NOTE    Kelly Ruiz  WHQ:[id number] DOB: 30-Aug-1923 DOA: 05/01/2017 PCP: Ronita Hipps, MD    Brief Narrative:  84 y.o.femalewith medical history significant ofdiastolic CHF, CKD stage 4, HTN, DM2. Patient recently admitted to hospitalist service from 3/12-3/21 for AKI believed to be pre-renal and diuretic induced. She was initially hydrated until 3/16 with improving kidney function, then developed acute on chronic diastolic CHF due to fluid overload. She was diuresed from that point on until discharge. Creat at discharge was 1.48 down from 3.9 on admission. It does not appear that nephrology was consulted at that time. CT at that time showed large bowel stool with atrophic kidneys She was quite anemic during the hospitalization and it appeared that she was iron deficient. She was discharged off her ARB and was given a regimen of lasix based on weight.  She was discharged from the rehab facility last week and her son noted and increase in weight and dyspnea  He gave her the extra lasix as prescribed but brought her to the hospital as she was getting weaker and had worsening dyspnea. Her labs in the ER showed K 5.8  Na 125  CO2 17  BUN 91  Cr 3.07  Ca 8.8  Glc 173         Assessment & Plan:   Principal Problem:   Acute renal failure superimposed on stage 4 chronic kidney disease (HCC) Active Problems:   Chronic diastolic congestive heart failure (HCC)   Essential hypertension   DM (diabetes mellitus), type 2 with renal complications (HCC)   Dyspnea, unspecified   Hyperkalemia   Acute on chronic diastolic HF (heart failure) (HCC)   CKD (chronic kidney disease), stage IV (HCC)   AKI (acute kidney injury) (HCC)   Metabolic acidosis, NAG, failure of bicarbonate regeneration   Hyponatremia   Weakness  1 acute on chronic kidney disease stage IV baseline creatinine 1.6-1.9 Questionable etiology.  Felt likely secondary to a prerenal azotemia as patient noted to be on  diuretics and also recent Bactrim use.  Per nephrology patient also likely with cardiorenal syndrome.  Patient initially did not look volume overloaded on examination.  BNP was elevated but not significantly as during last hospitalization.  Patient was given some boluses of normal saline in the ED. Patient was gently hydrated for 48 hours and IV fluids have been saline locked. Patient with a urine output of 1.7 L over the past 24 hours.  Renal function trending down.  Patient started on Lasix 40 mg daily per nephrology.  Follow urine output and renal function. Per Nephrology patient a very poor dialysis candidate due to debility and age.  Nephrology following and appreciate input and recommendations.   2.  Dyspnea Patient significantly dyspneic on minimal exertion per patient and per RN.  Patient even short of breath while taking multiple medications and on minimal exertion.  Patient denies any chest pain.  Likely secondary to acute on chronic diastolic heart failure as weight today is 147 pounds.  Weight from last cardiology office visit in January 2019 was 139 pounds.  Initially on admission it was felt patient was not volume overloaded.  Chest x-ray negative for any pulmonary edema.  May be secondary to worsening renal function versus cardiac etiology versus pulmonary etiology.  Cardiac enzymes negative x3.   Recent 2D echo 03/31/2017 with a EF of 60-65%, no wall motion abnormalities, grade 2 diastolic dysfunction.  Moderate mitral valvular regurgitation.  Moderately dilated left atrium.  Systolic pressure  moderately increased with a PA peak pressure of 50 mmHg.  VQ scan with low probability for PE.  Lower extremity Dopplers negative for DVT.  ABG was done which was unremarkable.  Patient seen by cardiology who do not feel patient shortness of breath is from valvular dysfunction.  Feels may be secondary to acute on chronic heart failure in the setting of acute on chronic kidney disease.  Patient started on  Lasix 40 mg daily per nephrology.  Some clinical improvement today.  Urine output of 1.7 L over the past 24 hours after being started on diuretics.  Cardiology and nephrology following.    3.  Hypertension Controlled on current regimen of Coreg, Cardura, hydralazine, imdur, Lasix.    4.  Hyperkalemia Likely secondary to acute on chronic renal failure.  Patient given some Kayexalate as well as IV bicarb with resolution of hyperkalemia on admission.  Potassium level at 4.8.  Continue renal diet.  Nephrology following.  5.  Weakness Questionable etiology.  May be secondary to problem #1.  PT/OT.  Needs skilled nursing facility on discharge.  6.  Hyponatremia Questionable etiology.  Likely secondary to hypovolemic hyponatremia versus hypervolemic hyponatremic. Sodium levels improved with gentle hydration.  TSH is 2.437.   Sodium 62. Urine creatinine 20.83.  Urine osmolality was 273.  IV fluids have been saline locked.  Patient started on Lasix 40 mg daily per nephrology.  Hyponatremia improving.  Per nephrology.  7.  Acidosis Likely secondary to problem #1.  Patient placed on bicarb tablets twice daily with improvement.  Monitor bicarb.  May need to discontinue bicarb tablets however will defer to nephrology.  Per nephrology.  8.  Acute on diastolic heart failure Patient with some shortness of breath on minimal exertion.  Concern for possible acute on chronic diastolic heart failure.  Patient's current rate is 147 pounds.  Weight from the office on 02/11/2017 was 139 pounds.  Patient on presentation did not look volume overloaded and looked dry.  Patient was hydrated gently with IV fluids.  IV fluids have been discontinued.  Chest x-ray negative for any pulmonary edema.  BNP in the 500s compared to 1800s during recent prior hospitalization.  Patient with some bibasilar crackles on examination and poor to fair air movement.  Patient started on Lasix 40 mg daily per nephrology on 05/04/2017 with a urine  output of 1.7 L.  Continue Coreg, hydralazine,imdur.  Cardiology following.   9.  Diabetes mellitus type 2 Patient not on insulin at home however on glyburide.  Hemoglobin A1c was 5.5 on 03/26/2017.  CBGs ranging from 121 -207.  Glyburide on hold.  Continue sliding scale insulin.  10.  Prognosis Patient with extremely delicate fluid balance.  Patient presenting with worsening shortness of breath and worsening renal function.  Renal function slowly improving.  Patient still dyspneic with minimal exertion.  Patient started on diuretics per nephrology.  If no improvement with administration of diuretics may need palliative care consultation/input.   DVT prophylaxis: Heparin Code Status: DNR Family Communication: Updated son at bedside.    Disposition Plan: Skilled nursing facility when clinically improved, renal function close to baseline, improvement with shortness of breath and per nephrology and cardiology.     Consultants:   Nephrology: Dr. Justin Mend 05/01/2017  Cardiology: Dr. Percival Spanish 05/03/2017  Procedures:   Chest x-ray 05/01/2017  VQ scan 05/02/2017  Lower extremity Dopplers 05/04/2017  Antimicrobials:   None   Subjective: Patient sitting up in chair.  States was short of breath this morning  however has slowly improved over the course of the day.  Denies any chest pain.    Objective: Vitals:   05/05/17 0600 05/05/17 0754 05/05/17 1000 05/05/17 1203  BP: (!) 156/50  (!) 166/50 (!) 136/59  Pulse: 64  70 (!) 59  Resp: 18   20  Temp: 98.1 F (36.7 C)   98.1 F (36.7 C)  TempSrc: Oral   Oral  SpO2: 98% 98% 97% 100%  Weight: 66.8 kg (147 lb 3.2 oz)     Height:        Intake/Output Summary (Last 24 hours) at 05/05/2017 1333 Last data filed at 05/05/2017 0919 Gross per 24 hour  Intake 960 ml  Output 1701 ml  Net -741 ml   Filed Weights   05/03/17 0502 05/04/17 0538 05/05/17 0600  Weight: 67.2 kg (148 lb 3.2 oz) 67.2 kg (148 lb 3.2 oz) 66.8 kg (147 lb 3.2 oz)     Examination:  General exam: NAD Respiratory system: Bibasilar crackles. No wheezes, no crackles, no rhonchi.  Poor to fair air movement.  Cardiovascular system: Regular rate rhythm no murmurs rubs or gallops.  No lower extremity edema. Gastrointestinal system: Abdomen is soft, nontender, nondistended, positive bowel sounds.  No rebound, no guarding.  Central nervous system: Alert and oriented. No focal neurological deficits. Extremities: Symmetric 5 x 5 power. Skin: No rashes, lesions or ulcers Psychiatry: Judgement and insight appear normal. Mood & affect appropriate.     Data Reviewed: I have personally reviewed following labs and imaging studies  CBC: Recent Labs  Lab 05/01/17 1610 05/03/17 0515 05/04/17 0516 05/05/17 0814  WBC 5.5 6.0 6.7 7.3  NEUTROABS  --  4.1  --   --   HGB 9.3* 8.9* 8.5* 8.7*  HCT 28.7* 27.3* 25.8* 27.1*  MCV 98.6 98.2 100.0 100.0  PLT 136* 136* 121* 789*   Basic Metabolic Panel: Recent Labs  Lab 05/01/17 1610 05/02/17 0209 05/02/17 0440 05/03/17 0515 05/04/17 0516 05/05/17 0814  NA 125*  --  126* 129* 131* 132*  K 5.8* 5.0 5.0 4.5 5.0 4.8  CL 98*  --  98* 96* 99* 99*  CO2 17*  --  18* 23 23 25   GLUCOSE 173*  --  101* 106* 118* 126*  BUN 91*  --  94* 92* 82* 76*  CREATININE 3.07*  --  2.94* 2.32* 2.20* 2.07*  CALCIUM 8.8*  --  8.1* 8.7* 8.8* 9.2  PHOS  --   --   --  3.9  --  3.7   GFR: Estimated Creatinine Clearance: 15.1 mL/min (A) (by C-G formula based on SCr of 2.07 mg/dL (H)). Liver Function Tests: Recent Labs  Lab 05/03/17 0515 05/05/17 0814  ALBUMIN 3.0* 2.9*   No results for input(s): LIPASE, AMYLASE in the last 168 hours. No results for input(s): AMMONIA in the last 168 hours. Coagulation Profile: No results for input(s): INR, PROTIME in the last 168 hours. Cardiac Enzymes: Recent Labs  Lab 05/02/17 2113 05/02/17 2323  TROPONINI <0.03 <0.03   BNP (last 3 results) No results for input(s): PROBNP in the last  8760 hours. HbA1C: No results for input(s): HGBA1C in the last 72 hours. CBG: Recent Labs  Lab 05/04/17 1142 05/04/17 1624 05/04/17 2110 05/05/17 0730 05/05/17 1135  GLUCAP 240* 184* 137* 121* 159*   Lipid Profile: No results for input(s): CHOL, HDL, LDLCALC, TRIG, CHOLHDL, LDLDIRECT in the last 72 hours. Thyroid Function Tests: Recent Labs    05/05/17 0814  TSH 2.437  Anemia Panel: No results for input(s): VITAMINB12, FOLATE, FERRITIN, TIBC, IRON, RETICCTPCT in the last 72 hours. Sepsis Labs: No results for input(s): PROCALCITON, LATICACIDVEN in the last 168 hours.  Recent Results (from the past 240 hour(s))  MRSA PCR Screening     Status: None   Collection Time: 05/02/17 10:04 PM  Result Value Ref Range Status   MRSA by PCR NEGATIVE NEGATIVE Final    Comment:        The GeneXpert MRSA Assay (FDA approved for NASAL specimens only), is one component of a comprehensive MRSA colonization surveillance program. It is not intended to diagnose MRSA infection nor to guide or monitor treatment for MRSA infections. Performed at [facility], [place] [address], [place], [place] [address]          Radiology Studies: No results found.      Scheduled Meds: . brinzolamide  1 drop Right Eye BID  . budesonide (PULMICORT) nebulizer solution  0.5 mg Nebulization BID  . carvedilol  6.25 mg Oral BID WC  . dicyclomine  10 mg Oral TID AC  . doxazosin  2 mg Oral QHS  . feeding supplement (ENSURE ENLIVE)  237 mL Oral BID BM  . ferrous sulfate  325 mg Oral Daily  . fluticasone  2 spray Each Nare BID  . furosemide  40 mg Oral Daily  . guaiFENesin  600 mg Oral QHS  . heparin  5,000 Units Subcutaneous Q8H  . hydrALAZINE  100 mg Oral TID  . insulin aspart  0-9 Units Subcutaneous TID WC  . ipratropium-albuterol  3 mL Nebulization BID  . isosorbide mononitrate  240 mg Oral Daily  . latanoprost  1 drop Right Eye QHS  . montelukast  10 mg Oral QHS  . multivitamin   Oral  BID  . oxymetazoline  2 spray Each Nare BID  . pantoprazole  40 mg Oral Daily  . polyethylene glycol  17 g Oral Daily  . senna-docusate  1 tablet Oral BID  . sodium bicarbonate  650 mg Oral BID  . timolol  1 drop Right Eye BID  . traZODone  75 mg Oral QHS  . vitamin B-12  1,000 mcg Oral Daily  . vitamin C  500 mg Oral Daily   Continuous Infusions:    LOS: 4 days    Time spent: 35 minutes    Irine Seal, MD [facility] Pager [phone]  If 7PM-7AM, please contact night-coverage [email or web address] Password [email or web address] 05/05/2017, 1:33 PM

## 2017-05-05 NOTE — Plan of Care (Signed)
  Problem: Education: Goal: Knowledge of General Education information will improve Outcome: Progressing   Problem: Health Behavior/Discharge Planning: Goal: Ability to manage health-related needs will improve Outcome: Progressing   Problem: Clinical Measurements: Goal: Respiratory complications will improve Outcome: Progressing

## 2017-05-06 DIAGNOSIS — I5043 Acute on chronic combined systolic (congestive) and diastolic (congestive) heart failure: Secondary | ICD-10-CM

## 2017-05-06 LAB — GLUCOSE, CAPILLARY
GLUCOSE-CAPILLARY: 172 mg/dL — AB (ref 65–99)
GLUCOSE-CAPILLARY: 169 mg/dL — AB (ref 65–99)
Glucose-Capillary: 117 mg/dL — ABNORMAL HIGH (ref 65–99)
Glucose-Capillary: 136 mg/dL — ABNORMAL HIGH (ref 65–99)

## 2017-05-06 LAB — RENAL FUNCTION PANEL
ALBUMIN: 2.8 g/dL — AB (ref 3.5–5.0)
ANION GAP: 10 (ref 5–15)
BUN: 71 mg/dL — AB (ref 6–20)
CALCIUM: 8.8 mg/dL — AB (ref 8.9–10.3)
CO2: 25 mmol/L (ref 22–32)
CREATININE: 1.87 mg/dL — AB (ref 0.44–1.00)
Chloride: 95 mmol/L — ABNORMAL LOW (ref 101–111)
GFR calc Af Amer: 26 mL/min — ABNORMAL LOW (ref >60)
GFR calc non Af Amer: 22 mL/min — ABNORMAL LOW (ref >60)
GLUCOSE: 109 mg/dL — AB (ref 65–99)
PHOSPHORUS: 3.7 mg/dL (ref 2.5–4.6)
Potassium: 4.6 mmol/L (ref 3.5–5.1)
SODIUM: 130 mmol/L — AB (ref 135–145)

## 2017-05-06 NOTE — Progress Notes (Signed)
Patient expresses concern about her blood pressure being high most of the time.

## 2017-05-06 NOTE — Progress Notes (Signed)
[facility] Progress Note    Assessment/ Plan:   1.  AKI on CKD III/VI: likely d/t Bactrim, stopped.  Cr is downtrending and her UOP is picking up.  There are no indications for dialysis at present.  Due to her pulmonary HTN and CHF she has cardiorenal syndrome and volume status is difficult to manage.  I expect her creatinine to fluctuate based on the administration of diuretics.  Her Cr is baseline 1.6-1.9 but has been as high as 3.9 in March 2019.  She's a very poor dialysis candidate due to debility and age and would be best to try to get her at a euvolemic state as possible with diuretics.  Continue Lasix 40 mg daily.  2.  Hyponatremia: improved,   3.  Metabolic acidosis: improving on bicarb gtt-->  Transitioned to PO  4.  Urinary urgency: Foley in place,  UA negative, don't need to restart abx for now  5.  Pulmonary HTN: PA pressures 50.  Management of volume as above  6.  Hyperkalemia: resolved.  Likely due to Bactrim.  7.  Dispo: pending.  If pt's breathing doesn't improve with administration of diuretics and restoration of euvolemia, would consider pall care c/s.   Subjective:   Patient states she has been urinating well. Has chronic SOB but unchanged. No CP.  Objective:   BP (!) 171/60 (BP Location: Left Arm)   Pulse 68   Temp 98.2 F (36.8 C) (Oral)   Resp 20   Ht 5' (1.524 m)   Wt 146 lb 9.7 oz (66.5 kg)   SpO2 97%   BMI 28.63 kg/m   Intake/Output Summary (Last 24 hours) at 05/06/2017 1333 Last data filed at 05/06/2017 0525 Gross per 24 hour  Intake 957 ml  Output 1400 ml  Net -443 ml   Weight change: -9.5 oz (-0.27 kg)  Physical Exam: GEN frail, elderly, NAD HEENT EOMI PERRL NECK+ JVD at 30 degrees PULM no O2, bibasilar crackles persist CV RRR ABD soft, nontender EXT trace LE edema  Imaging: No results found.  Labs: BMET Recent Labs  Lab 05/01/17 1610 05/02/17 0209 05/02/17 0440 05/03/17 0515 05/04/17 0516 05/05/17 0814  05/06/17 0706  NA 125*  --  126* 129* 131* 132* 130*  K 5.8* 5.0 5.0 4.5 5.0 4.8 4.6  CL 98*  --  98* 96* 99* 99* 95*  CO2 17*  --  18* 23 23 25 25   GLUCOSE 173*  --  101* 106* 118* 126* 109*  BUN 91*  --  94* 92* 82* 76* 71*  CREATININE 3.07*  --  2.94* 2.32* 2.20* 2.07* 1.87*  CALCIUM 8.8*  --  8.1* 8.7* 8.8* 9.2 8.8*  PHOS  --   --   --  3.9  --  3.7 3.7   CBC Recent Labs  Lab 05/01/17 1610 05/03/17 0515 05/04/17 0516 05/05/17 0814  WBC 5.5 6.0 6.7 7.3  NEUTROABS  --  4.1  --   --   HGB 9.3* 8.9* 8.5* 8.7*  HCT 28.7* 27.3* 25.8* 27.1*  MCV 98.6 98.2 100.0 100.0  PLT 136* 136* 121* 126*    Medications:    . brinzolamide  1 drop Right Eye BID  . budesonide (PULMICORT) nebulizer solution  0.5 mg Nebulization BID  . carvedilol  6.25 mg Oral BID WC  . dicyclomine  10 mg Oral TID AC  . doxazosin  2 mg Oral QHS  . feeding supplement (ENSURE ENLIVE)  237 mL Oral BID  BM  . ferrous sulfate  325 mg Oral Daily  . fluticasone  2 spray Each Nare BID  . furosemide  40 mg Oral Daily  . guaiFENesin  600 mg Oral QHS  . heparin  5,000 Units Subcutaneous Q8H  . hydrALAZINE  100 mg Oral TID  . insulin aspart  0-9 Units Subcutaneous TID WC  . ipratropium-albuterol  3 mL Nebulization BID  . isosorbide mononitrate  240 mg Oral Daily  . latanoprost  1 drop Right Eye QHS  . montelukast  10 mg Oral QHS  . multivitamin   Oral BID  . oxymetazoline  2 spray Each Nare BID  . pantoprazole  40 mg Oral Daily  . polyethylene glycol  17 g Oral Daily  . senna-docusate  1 tablet Oral BID  . sodium bicarbonate  650 mg Oral BID  . timolol  1 drop Right Eye BID  . traZODone  75 mg Oral QHS  . vitamin B-12  1,000 mcg Oral Daily  . vitamin C  500 mg Oral Daily     Harriet Butte, DO, PGY-2 05/06/2017, 1:33 PM

## 2017-05-06 NOTE — Progress Notes (Signed)
PROGRESS NOTE    Kelly Ruiz  VOJ:[id number] DOB: 1923/10/13 DOA: 05/01/2017 PCP: Ronita Hipps, MD    Brief Narrative:  84 y.o.femalewith medical history significant ofdiastolic CHF, CKD stage 4, HTN, DM2. Patient recently admitted to hospitalist service from 3/12-3/21 for AKI believed to be pre-renal and diuretic induced. She was initially hydrated until 3/16 with improving kidney function, then developed acute on chronic diastolic CHF due to fluid overload. She was diuresed from that point on until discharge. Creat at discharge was 1.48 down from 3.9 on admission. It does not appear that nephrology was consulted at that time. CT at that time showed large bowel stool with atrophic kidneys She was quite anemic during the hospitalization and it appeared that she was iron deficient. She was discharged off her ARB and was given a regimen of lasix based on weight.  She was discharged from the rehab facility last week and her son noted and increase in weight and dyspnea  He gave her the extra lasix as prescribed but brought her to the hospital as she was getting weaker and had worsening dyspnea. Her labs in the ER showed K 5.8  Na 125  CO2 17  BUN 91  Cr 3.07  Ca 8.8  Glc 173         Assessment & Plan:   Principal Problem:   Acute renal failure superimposed on stage 4 chronic kidney disease (HCC) Active Problems:   Chronic diastolic congestive heart failure (HCC)   Essential hypertension   DM (diabetes mellitus), type 2 with renal complications (HCC)   Dyspnea, unspecified   Hyperkalemia   Acute on chronic diastolic HF (heart failure) (HCC)   CKD (chronic kidney disease), stage IV (HCC)   AKI (acute kidney injury) (HCC)   Metabolic acidosis, NAG, failure of bicarbonate regeneration   Hyponatremia   Weakness  1 acute on chronic kidney disease stage IV baseline creatinine 1.6-1.9 Questionable etiology.  Felt likely secondary to a prerenal azotemia as patient noted to be on  diuretics and also recent Bactrim use.  Per nephrology patient also likely with cardiorenal syndrome.  Patient initially did not look volume overloaded on examination.  BNP was elevated but not significantly as during last hospitalization.  Patient was given some boluses of normal saline in the ED. Patient was gently hydrated for 48 hours and IV fluids have been saline locked. Patient with a urine output of 1.4 L over the past 24 hours.  Renal function trending down currently at 1.87 from 3.07 on admission.  Patient started on Lasix 40 mg daily per nephrology.  Follow urine output and renal function. Per Nephrology patient a very poor dialysis candidate due to debility and age.  Nephrology following and appreciate input and recommendations.   2.  Dyspnea likely secondary to acute on chronic diastolic heart failure Patient significantly dyspneic on minimal exertion per patient and per RN.  Patient even short of breath while taking multiple medications and on minimal exertion early on in the hospitalization.  Patient denies any chest pain.  Likely secondary to acute on chronic diastolic heart failure as weight today is 146 pounds.  Weight from last cardiology office visit in January 2019 was 139 pounds.  Initially on admission it was felt patient was not volume overloaded.  Chest x-ray negative for any pulmonary edema.  May be secondary to worsening renal function versus cardiac etiology versus pulmonary etiology.  Cardiac enzymes negative x3.   Recent 2D echo 03/31/2017 with a EF of 60-65%,  no wall motion abnormalities, grade 2 diastolic dysfunction.  Moderate mitral valvular regurgitation.  Moderately dilated left atrium.  Systolic pressure moderately increased with a PA peak pressure of 50 mmHg.  VQ scan with low probability for PE.  Lower extremity Dopplers negative for DVT.  ABG was done which was unremarkable.  Patient seen by cardiology who do not feel patient shortness of breath is from valvular dysfunction.   Feels may be secondary to acute on chronic heart failure in the setting of acute on chronic kidney disease.  Patient started on Lasix 40 mg daily per nephrology.  Some clinical improvement.  Urine output of 1.4 L over the past 24 hours after being started on diuretics.  Cardiology and nephrology following and managing volume status.    3.  Hypertension Blood pressure elevated.  Continue current regimen of Coreg, Cardura, hydralazine, Imdur, Lasix.  Will defer blood pressure control to cardiology and nephrology.   4.  Hyperkalemia Likely secondary to acute on chronic renal failure.  Patient given some Kayexalate as well as IV bicarb with resolution of hyperkalemia on admission.  Potassium level at 4.6.  Continue renal diet.  Nephrology following.  5.  Weakness Questionable etiology.  May be secondary to problem #1.  PT/OT.  Needs skilled nursing facility on discharge.  6.  Hyponatremia Questionable etiology.  Likely secondary to hypovolemic hyponatremia versus hypervolemic hyponatremic. Sodium levels improved with gentle hydration.  TSH is 2.437.   Sodium 62. Urine creatinine 20.83.  Urine osmolality was 273.  IV fluids have been saline locked.  Patient started on Lasix 40 mg daily per nephrology.  Continue Lasix and follow sodium levels.  Per nephrology.    7.  Acidosis Likely secondary to problem #1.  Patient placed on bicarb tablets twice daily with improvement.  Continue bicarb tablets.  Per nephrology.   8.  Acute on diastolic heart failure Patient with shortness of breath on minimal exertion.  Concern for possible acute on chronic diastolic heart failure.  Patient seems volume overloaded on examination with scattered crackles and positive JVD.  Patient's current rate is 146 pounds from 147 pounds.  Weight from the office on 02/11/2017 was 139 pounds.  Patient on presentation did not look volume overloaded and looked dry.  Patient was hydrated gently with IV fluids.  IV fluids have been  discontinued.  Chest x-ray negative for any pulmonary edema.  BNP in the 500s compared to 1800s during recent prior hospitalization.  Patient with few scattered crackles and poor air movement. Patient started on Lasix 40 mg daily per nephrology on 05/04/2017 with a urine output of 1.4 L over the past 24 hours.  Continue Coreg, hydralazine,imdur.  Cardiology following.   9.  Diabetes mellitus type 2 Patient not on insulin at home however on glyburide.  Hemoglobin A1c was 5.5 on 03/26/2017.  CBGs ranging from 117 -172.  Glyburide on hold.  Continue sliding scale insulin.  10.  Prognosis Patient with extremely delicate fluid balance.  Patient presenting with worsening shortness of breath and worsening renal function.  Renal function slowly improving.  Patient still dyspneic with minimal exertion.  Patient started on diuretics per nephrology.  Consulted palliative care for goals of care per family request.    DVT prophylaxis: Heparin Code Status: DNR Family Communication: Updated son at bedside.    Disposition Plan: Skilled nursing facility when clinically improved, renal function close to baseline, improvement with shortness of breath and per nephrology and cardiology.     Consultants:  Nephrology: Dr. Justin Mend 05/01/2017  Cardiology: Dr. Percival Spanish 05/03/2017  Procedures:   Chest x-ray 05/01/2017  VQ scan 05/02/2017  Lower extremity Dopplers 05/04/2017  Antimicrobials:   None   Subjective: Patient sitting up in chair.  States in the morning she is extremely short of breath.  Some improvement with shortness of breath later on this morning.  No chest pain.  Objective: Vitals:   05/06/17 0518 05/06/17 0520 05/06/17 0738 05/06/17 1036  BP: (!) 178/62  (!) 171/60   Pulse: 69  68   Resp: 20     Temp: 98.2 F (36.8 C)     TempSrc: Oral     SpO2: 95%  98% 97%  Weight:  66.5 kg (146 lb 9.7 oz)    Height:        Intake/Output Summary (Last 24 hours) at 05/06/2017 1154 Last data filed at  05/06/2017 0525 Gross per 24 hour  Intake 957 ml  Output 1400 ml  Net -443 ml   Filed Weights   05/04/17 0538 05/05/17 0600 05/06/17 0520  Weight: 67.2 kg (148 lb 3.2 oz) 66.8 kg (147 lb 3.2 oz) 66.5 kg (146 lb 9.7 oz)    Examination:  General exam: NAD Respiratory system: Diffuse scattered crackles.  No wheezing.  No rhonchi.  Poor to fair air movement.   Cardiovascular system: RRR no murmurs rubs or gallops.  No lower extremity edema noted.  + JVD.  Gastrointestinal system: Abdomen is nontender, mildly distended, soft, positive bowel sounds.  No guarding.  No rebound.  Central nervous system: Alert and oriented. No focal neurological deficits. Extremities: Symmetric 5 x 5 power. Skin: No rashes, lesions or ulcers Psychiatry: Judgement and insight appear normal. Mood & affect appropriate.     Data Reviewed: I have personally reviewed following labs and imaging studies  CBC: Recent Labs  Lab 05/01/17 1610 05/03/17 0515 05/04/17 0516 05/05/17 0814  WBC 5.5 6.0 6.7 7.3  NEUTROABS  --  4.1  --   --   HGB 9.3* 8.9* 8.5* 8.7*  HCT 28.7* 27.3* 25.8* 27.1*  MCV 98.6 98.2 100.0 100.0  PLT 136* 136* 121* 034*   Basic Metabolic Panel: Recent Labs  Lab 05/02/17 0440 05/03/17 0515 05/04/17 0516 05/05/17 0814 05/06/17 0706  NA 126* 129* 131* 132* 130*  K 5.0 4.5 5.0 4.8 4.6  CL 98* 96* 99* 99* 95*  CO2 18* 23 23 25 25   GLUCOSE 101* 106* 118* 126* 109*  BUN 94* 92* 82* 76* 71*  CREATININE 2.94* 2.32* 2.20* 2.07* 1.87*  CALCIUM 8.1* 8.7* 8.8* 9.2 8.8*  PHOS  --  3.9  --  3.7 3.7   GFR: Estimated Creatinine Clearance: 16.7 mL/min (A) (by C-G formula based on SCr of 1.87 mg/dL (H)). Liver Function Tests: Recent Labs  Lab 05/03/17 0515 05/05/17 0814 05/06/17 0706  ALBUMIN 3.0* 2.9* 2.8*   No results for input(s): LIPASE, AMYLASE in the last 168 hours. No results for input(s): AMMONIA in the last 168 hours. Coagulation Profile: No results for input(s): INR, PROTIME  in the last 168 hours. Cardiac Enzymes: Recent Labs  Lab 05/02/17 2113 05/02/17 2323  TROPONINI <0.03 <0.03   BNP (last 3 results) No results for input(s): PROBNP in the last 8760 hours. HbA1C: No results for input(s): HGBA1C in the last 72 hours. CBG: Recent Labs  Lab 05/05/17 0730 05/05/17 1135 05/05/17 1621 05/05/17 2153 05/06/17 0807  GLUCAP 121* 159* 207* 202* 117*   Lipid Profile: No results for input(s): CHOL, HDL,  LDLCALC, TRIG, CHOLHDL, LDLDIRECT in the last 72 hours. Thyroid Function Tests: Recent Labs    05/05/17 0814  TSH 2.437   Anemia Panel: No results for input(s): VITAMINB12, FOLATE, FERRITIN, TIBC, IRON, RETICCTPCT in the last 72 hours. Sepsis Labs: No results for input(s): PROCALCITON, LATICACIDVEN in the last 168 hours.  Recent Results (from the past 240 hour(s))  MRSA PCR Screening     Status: None   Collection Time: 05/02/17 10:04 PM  Result Value Ref Range Status   MRSA by PCR NEGATIVE NEGATIVE Final    Comment:        The GeneXpert MRSA Assay (FDA approved for NASAL specimens only), is one component of a comprehensive MRSA colonization surveillance program. It is not intended to diagnose MRSA infection nor to guide or monitor treatment for MRSA infections. Performed at [facility], [place] [address], [place], [place] [address]          Radiology Studies: No results found.      Scheduled Meds: . brinzolamide  1 drop Right Eye BID  . budesonide (PULMICORT) nebulizer solution  0.5 mg Nebulization BID  . carvedilol  6.25 mg Oral BID WC  . dicyclomine  10 mg Oral TID AC  . doxazosin  2 mg Oral QHS  . feeding supplement (ENSURE ENLIVE)  237 mL Oral BID BM  . ferrous sulfate  325 mg Oral Daily  . fluticasone  2 spray Each Nare BID  . furosemide  40 mg Oral Daily  . guaiFENesin  600 mg Oral QHS  . heparin  5,000 Units Subcutaneous Q8H  . hydrALAZINE  100 mg Oral TID  . insulin aspart  0-9 Units Subcutaneous TID WC  .  ipratropium-albuterol  3 mL Nebulization BID  . isosorbide mononitrate  240 mg Oral Daily  . latanoprost  1 drop Right Eye QHS  . montelukast  10 mg Oral QHS  . multivitamin   Oral BID  . oxymetazoline  2 spray Each Nare BID  . pantoprazole  40 mg Oral Daily  . polyethylene glycol  17 g Oral Daily  . senna-docusate  1 tablet Oral BID  . sodium bicarbonate  650 mg Oral BID  . timolol  1 drop Right Eye BID  . traZODone  75 mg Oral QHS  . vitamin B-12  1,000 mcg Oral Daily  . vitamin C  500 mg Oral Daily   Continuous Infusions:    LOS: 5 days    Time spent: 35 minutes    Irine Seal, MD [facility] Pager [phone]  If 7PM-7AM, please contact night-coverage [email or web address] Password [facility] 05/06/2017, 11:54 AM

## 2017-05-06 NOTE — Progress Notes (Addendum)
Progress Note  Patient Name: Kelly Ruiz Date of Encounter: 05/06/2017  Primary Cardiologist: Minus Breeding, MD   Subjective   Pt reports feeling groggy this morning, but was able to sleep last night with improved breathing.  Inpatient Medications    Scheduled Meds: . brinzolamide  1 drop Right Eye BID  . budesonide (PULMICORT) nebulizer solution  0.5 mg Nebulization BID  . carvedilol  6.25 mg Oral BID WC  . dicyclomine  10 mg Oral TID AC  . doxazosin  2 mg Oral QHS  . feeding supplement (ENSURE ENLIVE)  237 mL Oral BID BM  . ferrous sulfate  325 mg Oral Daily  . fluticasone  2 spray Each Nare BID  . furosemide  40 mg Oral Daily  . guaiFENesin  600 mg Oral QHS  . heparin  5,000 Units Subcutaneous Q8H  . hydrALAZINE  100 mg Oral TID  . insulin aspart  0-9 Units Subcutaneous TID WC  . ipratropium-albuterol  3 mL Nebulization BID  . isosorbide mononitrate  240 mg Oral Daily  . latanoprost  1 drop Right Eye QHS  . montelukast  10 mg Oral QHS  . multivitamin   Oral BID  . oxymetazoline  2 spray Each Nare BID  . pantoprazole  40 mg Oral Daily  . polyethylene glycol  17 g Oral Daily  . senna-docusate  1 tablet Oral BID  . sodium bicarbonate  650 mg Oral BID  . timolol  1 drop Right Eye BID  . traZODone  75 mg Oral QHS  . vitamin B-12  1,000 mcg Oral Daily  . vitamin C  500 mg Oral Daily   Continuous Infusions:  PRN Meds: acetaminophen **OR** acetaminophen, ipratropium-albuterol, ipratropium-albuterol, ondansetron **OR** ondansetron (ZOFRAN) IV   Vital Signs    Vitals:   05/05/17 2155 05/06/17 0518 05/06/17 0520 05/06/17 0738  BP: (!) 167/69 (!) 178/62  (!) 171/60  Pulse: 70 69  68  Resp:  20    Temp:  98.2 F (36.8 C)    TempSrc:  Oral    SpO2:  95%  98%  Weight:   146 lb 9.7 oz (66.5 kg)   Height:        Intake/Output Summary (Last 24 hours) at 05/06/2017 0759 Last data filed at 05/06/2017 0525 Gross per 24 hour  Intake 1197 ml  Output 1400 ml  Net  -203 ml   Filed Weights   05/04/17 0538 05/05/17 0600 05/06/17 0520  Weight: 148 lb 3.2 oz (67.2 kg) 147 lb 3.2 oz (66.8 kg) 146 lb 9.7 oz (66.5 kg)    Telemetry    sinus - Personally Reviewed  ECG    No new tracings - Personally Reviewed  Physical Exam   GEN: No acute distress.   Neck: JVP increased  Cardiac: RRR, no murmurs, rubs, or gallops.  Respiratory: Clear to auscultation bilaterally. GI: Soft, nontender, non-distended  MS: No edema; No deformity. Neuro:  Nonfocal  Psych: Normal affect   Labs    Chemistry Recent Labs  Lab 05/03/17 0515 05/04/17 0516 05/05/17 0814  NA 129* 131* 132*  K 4.5 5.0 4.8  CL 96* 99* 99*  CO2 23 23 25   GLUCOSE 106* 118* 126*  BUN 92* 82* 76*  CREATININE 2.32* 2.20* 2.07*  CALCIUM 8.7* 8.8* 9.2  ALBUMIN 3.0*  --  2.9*  GFRNONAA 17* 18* 20*  GFRAA 20* 21* 23*  ANIONGAP 10 9 8      Hematology Recent Labs  Lab 05/03/17 0515  05/04/17 0516 05/05/17 0814  WBC 6.0 6.7 7.3  RBC 2.78* 2.58* 2.71*  HGB 8.9* 8.5* 8.7*  HCT 27.3* 25.8* 27.1*  MCV 98.2 100.0 100.0  MCH 32.0 32.9 32.1  MCHC 32.6 32.9 32.1  RDW 13.3 13.0 13.0  PLT 136* 121* 126*    Cardiac Enzymes Recent Labs  Lab 05/02/17 2113 05/02/17 2323  TROPONINI <0.03 <0.03    Recent Labs  Lab 05/01/17 1607  TROPIPOC 0.01     BNP Recent Labs  Lab 05/01/17 1612  BNP 517.7*     DDimer No results for input(s): DDIMER in the last 168 hours.   Radiology    No results found.  Cardiac Studies   Echo 03/31/17: Study Conclusions - Left ventricle: The cavity size was normal. Wall thickness was   increased in a pattern of moderate LVH. Systolic function was   normal. The estimated ejection fraction was in the range of 60%   to 65%. Wall motion was normal; there were no regional wall   motion abnormalities. Features are consistent with a pseudonormal   left ventricular filling pattern, with concomitant abnormal   relaxation and increased filling pressure  (grade 2 diastolic   dysfunction). - Mitral valve: Severely calcified annulus. There was moderate   regurgitation. - Left atrium: The atrium was moderately dilated. - Pulmonary arteries: Systolic pressure was moderately increased.   PA peak pressure: 50 mm Hg (S).  Patient Profile     84 y.o. female with a hx of chronic diastolic dysfunction, stage IV CKD, HTN and T2DM, admitted for acute on chronic renal failure, who is being seen for management of chronic diastolic HF and evaluation for dyspnea.  Assessment & Plan    1. Acute on chronic diastolic heart failure, dyspnea - pt weight is 146 lbs today from 150 lbs on admission; dry weight thought to be near 143 lbs (when discharged 03/2017) - fluid balance has been difficult in the setting of acute on CKD - overall net positive 2.7 L with 1.4 L urine output yesterday - nephrology started lasix 40 mg PO daily 05/04/17 - agree with lasix - pt is breathing better last night and today   2. Acute on chronic renal failure stage IV - fluid status and lasix per nephrology - thought to be due to dehydration and bactrim - sCr improving to 2.07 (2.20) - labs pending today - K is 4.8 (5.0)    For questions or updates, please contact [facility] Please consult [email or web address] for contact info under Cardiology/STEMI.      Signed, Tami Lin Duke, PA  05/06/2017, 7:59 AM    Pt seen and examined   I agree with findings as noted above by A Duke Pt I/O difficult    Breathing improved   REnal following    WOuld continu lasix   Has been switched to po On exam:  Lung are rel clear  Cardiac exam;   RRR  No S3   Ext with no signif edeam  Will continue to follow output and renal func on current dose of lasix  Pt lives at home with husband  Son is here   Discussed dsposition with son and pt   They (particularly he) would like to meet with hospice/palliative care .  Will arrange for consult.  Kelly Ruiz

## 2017-05-06 NOTE — Progress Notes (Signed)
Patient still SOB with minimal exertion.

## 2017-05-06 NOTE — Clinical Social Work Note (Signed)
Clinical Social Work Assessment  Patient Details  Name: Kelly Ruiz MRN: [id number] Date of Birth: [birth year]  Date of referral:  05/06/17               Reason for consult:  Facility Placement, Discharge Planning                Permission sought to share information with:  Facility Sport and exercise psychologist, Family Supports Permission granted to share information::  Yes, Verbal Permission Granted  Name::     Ayn Domangue  Agency::  [facility] SN[facility]  Relationship::  son  Contact Information:  [phone]  Housing/Transportation Living arrangements for the past 2 months:  [place], [place] of Information:  Patient, Adult Children Patient Interpreter Needed:  None Criminal Activity/Legal Involvement Pertinent to Current Situation/Hospitalization:  No - Comment as needed Significant Relationships:  Adult Children Lives with:  Self Do you feel safe going back to the place where you live?  Yes Need for family participation in patient care:  Yes (Comment)  Care giving concerns: Patient from home with son. Patient recently admitted to SNF on 04/04/17 and stayed there for 18 days. PT recommending SNF again.   Social Worker assessment / plan: CSW met with patient and son, Hoy Morn, at bedside. CSW discussed PT recommendations for SNF. Son indicated patient was at [facility] for 18 days after last hospital admission. Son hopeful patient can return there and has discussed patient's being readmitted there with their admissions staff. CSW explained that since patient used 18 days at SNF recently, patient will have co-pays under Medicare during this rehab stay. Son voiced understanding and indicated they would be able to pay the co-pay.  CSW spoke to DON at the SNF, as admissions staff out today. DON indicated they are aware of patient and will review referral. CSW sent initial referral and will support with discharge  planning.  Employment status:  Retired Forensic scientist:  [employer] PT Recommendations:  [place] / Referral to community resources:  [place]  Patient/Family's Response to care: Son appreciative of care.  Patient/Family's Understanding of and Emotional Response to Diagnosis, Current Treatment, and Prognosis: Son with understanding of patient's condition and hopeful for placement at [facility].  Emotional Assessment Appearance:  Appears stated age Attitude/Demeanor/Rapport:  Engaged Affect (typically observed):  Calm Orientation:  Oriented to Self, Oriented to Place, Oriented to  Time, Oriented to Situation Alcohol / Substance use:  Not Applicable Psych involvement (Current and /or in the community):  No (Comment)  Discharge Needs  Concerns to be addressed:  Discharge Planning Concerns, Care Coordination Readmission within the last 30 days:  Yes Current discharge risk:  Physical Impairment Barriers to Discharge:  Continued Medical Work up   Estanislado Emms, LCSW 05/06/2017, 3:19 PM

## 2017-05-06 NOTE — Progress Notes (Signed)
Kelly Ruiz PROGRESS NOTE  Assessment/ Plan: Pt is a 84 y.o. yo female with congestive heart failure, CKD stage IV, hypertension, type with diabetes with acute on chronic kidney disease.  Assessment/Plan:  #Acute kidney injury on CKD stage IV: Due to volume depletion and Bactrim.  Bactrim has been discontinued.  Creatinine level is now trending down to 1.87 today.  Her baseline creatinine is around 1.6-1.9.  Patient is not oliguric, urine output of about 1400 cc in 24-hour.  She has CHF and pulmonary hypertension, continue oral Lasix.  Monitor BMP.  Avoid nephrotoxins.  #Hyponatremia improved  #Metabolic acidosis status post bicarbonate drip.  On oral sodium bicarbonate.  Improved now.  #Hyperkalemia due to Bactrim.  Improved now.  Subjective:   Objective Vital signs in last 24 hours: Vitals:   05/06/17 0518 05/06/17 0520 05/06/17 0738 05/06/17 1036  BP: (!) 178/62  (!) 171/60   Pulse: 69  68   Resp: 20     Temp: 98.2 F (36.8 C)     TempSrc: Oral     SpO2: 95%  98% 97%  Weight:  66.5 kg (146 lb 9.7 oz)    Height:       Weight change: -0.27 kg (-9.5 oz)  Intake/Output Summary (Last 24 hours) at 05/06/2017 1552 Last data filed at 05/06/2017 1403 Gross per 24 hour  Intake 1197 ml  Output 1600 ml  Net -403 ml       Labs: Basic Metabolic Panel: Recent Labs  Lab 05/03/17 0515 05/04/17 0516 05/05/17 0814 05/06/17 0706  NA 129* 131* 132* 130*  K 4.5 5.0 4.8 4.6  CL 96* 99* 99* 95*  CO2 23 23 25 25   GLUCOSE 106* 118* 126* 109*  BUN 92* 82* 76* 71*  CREATININE 2.32* 2.20* 2.07* 1.87*  CALCIUM 8.7* 8.8* 9.2 8.8*  PHOS 3.9  --  3.7 3.7   Liver Function Tests: Recent Labs  Lab 05/03/17 0515 05/05/17 0814 05/06/17 0706  ALBUMIN 3.0* 2.9* 2.8*   No results for input(s): LIPASE, AMYLASE in the last 168 hours. No results for input(s): AMMONIA in the last 168 hours. CBC: Recent Labs  Lab 05/01/17 1610 05/03/17 0515 05/04/17 0516  05/05/17 0814  WBC 5.5 6.0 6.7 7.3  NEUTROABS  --  4.1  --   --   HGB 9.3* 8.9* 8.5* 8.7*  HCT 28.7* 27.3* 25.8* 27.1*  MCV 98.6 98.2 100.0 100.0  PLT 136* 136* 121* 126*   Cardiac Enzymes: Recent Labs  Lab 05/02/17 2113 05/02/17 2323  TROPONINI <0.03 <0.03   CBG: Recent Labs  Lab 05/05/17 1135 05/05/17 1621 05/05/17 2153 05/06/17 0807 05/06/17 1203  GLUCAP 159* 207* 202* 117* 172*    Iron Studies: No results for input(s): IRON, TIBC, TRANSFERRIN, FERRITIN in the last 72 hours. Studies/Results: No results found.  Medications: Infusions:   Scheduled Medications: . brinzolamide  1 drop Right Eye BID  . budesonide (PULMICORT) nebulizer solution  0.5 mg Nebulization BID  . carvedilol  6.25 mg Oral BID WC  . dicyclomine  10 mg Oral TID AC  . doxazosin  2 mg Oral QHS  . feeding supplement (ENSURE ENLIVE)  237 mL Oral BID BM  . ferrous sulfate  325 mg Oral Daily  . fluticasone  2 spray Each Nare BID  . furosemide  40 mg Oral Daily  . guaiFENesin  600 mg Oral QHS  . heparin  5,000 Units Subcutaneous Q8H  . hydrALAZINE  100 mg Oral TID  .  insulin aspart  0-9 Units Subcutaneous TID WC  . ipratropium-albuterol  3 mL Nebulization BID  . isosorbide mononitrate  240 mg Oral Daily  . latanoprost  1 drop Right Eye QHS  . montelukast  10 mg Oral QHS  . multivitamin   Oral BID  . oxymetazoline  2 spray Each Nare BID  . pantoprazole  40 mg Oral Daily  . polyethylene glycol  17 g Oral Daily  . senna-docusate  1 tablet Oral BID  . sodium bicarbonate  650 mg Oral BID  . timolol  1 drop Right Eye BID  . traZODone  75 mg Oral QHS  . vitamin B-12  1,000 mcg Oral Daily  . vitamin C  500 mg Oral Daily    have reviewed scheduled and prn medications.  Physical Exam: General: Not in distress Heart: Regular rate rhythm S1-S2 normal Lungs: Clear bilateral, no wheezing Abdomen: Abdomen soft, nontender Extremities: Trace lower extremities edema   Cain Fitzhenry Tanna Furry 05/06/2017,3:52 PM  LOS: 5 days

## 2017-05-06 NOTE — Progress Notes (Signed)
Physical Therapy Treatment Patient Details Name: Kelly Ruiz MRN: [id number] DOB: March 11, 1923 Today's Date: 05/06/2017    History of Present Illness Pt is a 84 y/o female admitted secondary to SOB and AKI. PMH including but not limited to diastolic CHF, CKD stage 4, HTN, DM2.    PT Comments    Pt was able to go through gait with no decline in O2 sats during mobility despite SOB, and the strengthening exercises are comfortable but also fatiguing.  Her tolerance for mobility nearly required a sitting rest break but did educate her about establishing limits of comfort.  Progress endurance and strength toward shortening her stay in SNF.    Follow Up Recommendations  SNF;Supervision/Assistance - 24 hour     Equipment Recommendations  None recommended by PT    Recommendations for Other Services       Precautions / Restrictions Precautions Precautions: Fall Restrictions Weight Bearing Restrictions: No    Mobility  Bed Mobility               General bed mobility comments: upin chair when PT arrived  Transfers Overall transfer level: Needs assistance Equipment used: Rolling walker (2 wheeled) Transfers: Sit to/from Stand Sit to Stand: Min guard;Min assist         General transfer comment: min assist to power up from recliner  Ambulation/Gait Ambulation/Gait assistance: Min guard Ambulation Distance (Feet): 45 Feet Assistive device: Rolling walker (2 wheeled) Gait Pattern/deviations: Step-through pattern;Decreased step length - right;Decreased step length - left;Decreased stride length;Trunk flexed Gait velocity: reduced Gait velocity interpretation: <1.8 ft/sec, indicate of risk for recurrent falls General Gait Details: SOB with the fatigue but O2 sats were 94-95%   Stairs             Wheelchair Mobility    Modified Rankin (Stroke Patients Only)       Balance Overall balance assessment: Needs assistance Sitting-balance support: Feet  supported Sitting balance-Leahy Scale: Good     Standing balance support: Bilateral upper extremity supported;During functional activity Standing balance-Leahy Scale: Poor                              Cognition Arousal/Alertness: Awake/alert Behavior During Therapy: WFL for tasks assessed/performed Overall Cognitive Status: Within Functional Limits for tasks assessed                                        Exercises General Exercises - Lower Extremity Ankle Circles/Pumps: AROM;Both;5 reps Long Arc Quad: Strengthening;Both;10 reps Heel Slides: Strengthening;Both;10 reps Hip ABduction/ADduction: Strengthening;Both;10 reps    General Comments        Pertinent Vitals/Pain Pain Assessment: No/denies pain    Home Living                      Prior Function            PT Goals (current goals can now be found in the care plan section) Acute Rehab PT Goals Patient Stated Goal: go to rehab to get stronger Progress towards PT goals: Progressing toward goals    Frequency    Min 2X/week      PT Plan Current plan remains appropriate    Co-evaluation              AM-PAC PT "6 Clicks" Daily Activity  Outcome Measure  Difficulty turning over  in bed (including adjusting bedclothes, sheets and blankets)?: A Little Difficulty moving from lying on back to sitting on the side of the bed? : A Little Difficulty sitting down on and standing up from a chair with arms (e.g., wheelchair, bedside commode, etc,.)?: Unable Help needed moving to and from a bed to chair (including a wheelchair)?: A Little Help needed walking in hospital room?: A Little Help needed climbing 3-5 steps with a railing? : A Lot 6 Click Score: 15    End of Session Equipment Utilized During Treatment: Gait belt Activity Tolerance: Patient limited by fatigue Patient left: in chair;with call bell/phone within reach;with family/visitor present Nurse Communication:  Mobility status PT Visit Diagnosis: Other abnormalities of gait and mobility (R26.89)     Time: 3704-8889 PT Time Calculation (min) (ACUTE ONLY): 24 min  Charges:  $Gait Training: 8-22 mins $Therapeutic Exercise: 8-22 mins                    G Codes:  Functional Assessment Tool Used: AM-PAC 6 Clicks Basic Mobility     Ramond Dial 05/06/2017, 4:15 PM   Mee Hives, PT MS Acute Rehab Dept. Number: [place] and [place]

## 2017-05-07 ENCOUNTER — Inpatient Hospital Stay (HOSPITAL_COMMUNITY): Payer: Medicare Other

## 2017-05-07 DIAGNOSIS — Z515 Encounter for palliative care: Secondary | ICD-10-CM

## 2017-05-07 DIAGNOSIS — Z7189 Other specified counseling: Secondary | ICD-10-CM

## 2017-05-07 DIAGNOSIS — N390 Urinary tract infection, site not specified: Secondary | ICD-10-CM | POA: Diagnosis not present

## 2017-05-07 LAB — RENAL FUNCTION PANEL
ANION GAP: 9 (ref 5–15)
Albumin: 2.7 g/dL — ABNORMAL LOW (ref 3.5–5.0)
BUN: 72 mg/dL — ABNORMAL HIGH (ref 6–20)
CALCIUM: 9 mg/dL (ref 8.9–10.3)
CO2: 26 mmol/L (ref 22–32)
CREATININE: 2 mg/dL — AB (ref 0.44–1.00)
Chloride: 97 mmol/L — ABNORMAL LOW (ref 101–111)
GFR, EST AFRICAN AMERICAN: 24 mL/min — AB (ref >60)
GFR, EST NON AFRICAN AMERICAN: 21 mL/min — AB (ref >60)
Glucose, Bld: 149 mg/dL — ABNORMAL HIGH (ref 65–99)
Phosphorus: 3.2 mg/dL (ref 2.5–4.6)
Potassium: 4.7 mmol/L (ref 3.5–5.1)
SODIUM: 132 mmol/L — AB (ref 135–145)

## 2017-05-07 LAB — GLUCOSE, CAPILLARY
GLUCOSE-CAPILLARY: 122 mg/dL — AB (ref 65–99)
GLUCOSE-CAPILLARY: 148 mg/dL — AB (ref 65–99)
Glucose-Capillary: 151 mg/dL — ABNORMAL HIGH (ref 65–99)
Glucose-Capillary: 111 mg/dL — ABNORMAL HIGH (ref 65–99)

## 2017-05-07 MED ORDER — OXYCODONE HCL 5 MG PO TABS: 5.0000 mg | ORAL_TABLET | Freq: Four times a day (QID) | ORAL | Status: DC | PRN

## 2017-05-07 MED ORDER — CARVEDILOL 12.5 MG PO TABS
12.5000 mg | ORAL_TABLET | Freq: Two times a day (BID) | ORAL | Status: DC
  Administered 2017-05-07 – 2017-05-09 (×4): 12.5 mg via ORAL
  Filled 2017-05-07 (×4): qty 1

## 2017-05-07 MED ORDER — ACETAMINOPHEN 500 MG PO TABS
1000.0000 mg | ORAL_TABLET | Freq: Three times a day (TID) | ORAL | Status: DC
  Administered 2017-05-07 – 2017-05-09 (×6): 1000 mg via ORAL
  Filled 2017-05-07 (×6): qty 2

## 2017-05-07 NOTE — Progress Notes (Signed)
PROGRESS NOTE    Kelly Ruiz  ATF:[id number] DOB: [birth year] DOA: 05/01/2017 PCP: Ronita Hipps, MD    Brief Narrative:  84 y.o.femalewith medical history significant ofdiastolic CHF, CKD stage 4, HTN, DM2. Patient recently admitted to hospitalist service from 3/12-3/21 for AKI believed to be pre-renal and diuretic induced. She was initially hydrated until 3/16 with improving kidney function, then developed acute on chronic diastolic CHF due to fluid overload. She was diuresed from that point on until discharge. Creat at discharge was 1.48 down from 3.9 on admission. It does not appear that nephrology was consulted at that time. CT at that time showed large bowel stool with atrophic kidneys She was quite anemic during the hospitalization and it appeared that she was iron deficient. She was discharged off her ARB and was given a regimen of lasix based on weight.  She was discharged from the rehab facility last week and her son noted and increase in weight and dyspnea  He gave her the extra lasix as prescribed but brought her to the hospital as she was getting weaker and had worsening dyspnea. Her labs in the ER showed K 5.8  Na 125  CO2 17  BUN 91  Cr 3.07  Ca 8.8  Glc 173         Assessment & Plan:   Principal Problem:   Acute renal failure superimposed on stage 4 chronic kidney disease (HCC) Active Problems:   Chronic diastolic congestive heart failure (HCC)   Essential hypertension   DM (diabetes mellitus), type 2 with renal complications (HCC)   Dyspnea, unspecified   Hyperkalemia   Acute on chronic diastolic HF (heart failure) (HCC)   CKD (chronic kidney disease), stage IV (HCC)   AKI (acute kidney injury) (HCC)   Metabolic acidosis, NAG, failure of bicarbonate regeneration   Hyponatremia   Weakness  1 acute on chronic kidney disease stage IV baseline creatinine 1.6-1.9 Questionable etiology.  Felt likely secondary to a prerenal azotemia as patient noted to be on  diuretics and also recent Bactrim use.  Per nephrology patient also likely with cardiorenal syndrome.  Patient initially did not look volume overloaded on examination.  BNP was elevated but not significantly as during last hospitalization.  Patient was given some boluses of normal saline in the ED. Patient was gently hydrated for 48 hours and IV fluids have been saline locked. Patient with a urine output of 2 L over the past 24 hours.  Renal function seems to have plateaued and currently at 2.0 from 1.87 from 3.07 on admission.  Patient started on Lasix 40 mg daily per nephrology.  Follow urine output and renal function. Per Nephrology patient a very poor dialysis candidate due to debility and age.  Nephrology following and appreciate input and recommendations.   2.  Dyspnea likely secondary to acute on chronic diastolic heart failure Patient significantly dyspneic on minimal exertion per patient and per RN.  Patient even short of breath while taking multiple medications and on minimal exertion earlier on in the hospitalization.  Patient denies any chest pain.  Likely secondary to acute on chronic diastolic heart failure as weight today is 142 pounds.  Weight from last cardiology office visit in January 2019 was 139 pounds.  Initially on admission it was felt patient was not volume overloaded.  Chest x-ray negative for any pulmonary edema.  May be secondary to worsening renal function versus cardiac etiology versus pulmonary etiology.  Cardiac enzymes negative x3.   Recent 2D echo 03/31/2017  with a EF of 60-65%, no wall motion abnormalities, grade 2 diastolic dysfunction.  Moderate mitral valvular regurgitation.  Moderately dilated left atrium.  Systolic pressure moderately increased with a PA peak pressure of 50 mmHg.  VQ scan with low probability for PE.  Lower extremity Dopplers negative for DVT.  ABG was done which was unremarkable.  Patient seen by cardiology who do not feel patient shortness of breath is  from valvular dysfunction.  Feels may be secondary to acute on chronic heart failure in the setting of acute on chronic kidney disease.  Patient started on Lasix 40 mg daily per nephrology.  Some clinical improvement.  Urine output of 2 L over the past 24 hours after being started on diuretics.  Cardiology and nephrology following and managing volume status.    3.  Hypertension Blood pressure elevated.  Continue current regimen of Coreg, Cardura, hydralazine, Imdur, Lasix.  Will defer blood pressure control to cardiology and nephrology.   4.  Hyperkalemia Likely secondary to acute on chronic renal failure.  Patient given some Kayexalate as well as IV bicarb with resolution of hyperkalemia on admission.  Potassium level at 4.7.  Continue renal diet.  Nephrology following.  5.  Weakness Questionable etiology.  May be secondary to problem #1.  PT/OT.  Needs skilled nursing facility on discharge.  6.  Hyponatremia Questionable etiology.  Likely secondary to hypovolemic hyponatremia versus hypervolemic hyponatremic. Sodium levels initially improved with gentle hydration however continue to improve with diuresis.  TSH is 2.437.   Sodium 62. Urine creatinine 20.83.  Urine osmolality was 273.  IV fluids have been saline locked. Continue Lasix 40 mg daily per nephrology.  Per nephrology.    7.  Acidosis Likely secondary to problem #1.  Patient on bicarb tablets twice daily with improvement.  Bicarb levels may need to either decrease or discontinue bicarb tablets on discharge.  Per nephrology.   8.  Acute on diastolic heart failure Patient with shortness of breath on minimal exertion however some improvement since admission.  Patient seems volume overloaded on examination with scattered crackles and positive JVD.  Patient's current weight is 142 pounds from 146 pounds from 147 pounds.  Weight from the office visit on 02/11/2017 was 139 pounds.  Patient on presentation did not look volume overloaded and looked  dry.  Patient was hydrated gently with IV fluids.  IV fluids have been discontinued.  Chest x-ray negative for any pulmonary edema.  BNP in the 500s compared to 1800s during recent prior hospitalization.  Patient with few scattered crackles and poor air movement. Patient started on Lasix 40 mg daily per nephrology on 05/04/2017 with a urine output of 2 L over the past 24 hours.  Continue Coreg, hydralazine,imdur.  Cardiology following.   9.  Diabetes mellitus type 2 Patient not on insulin at home however on glyburide.  Hemoglobin A1c was 5.5 on 03/26/2017.  CBGs ranging from 122 -151.  Continue to hold glyburide.  Sliding scale insulin.    10.  Prognosis Patient with extremely delicate fluid balance.  Patient presenting with worsening shortness of breath and worsening renal function.  Renal function slowly improving.  Patient still dyspneic with minimal exertion.  Patient on diuretics with diuresis however still significantly short of breath.  Palliative care consultation pending for goals of care.    DVT prophylaxis: Heparin Code Status: DNR Family Communication: Updated son at bedside.    Disposition Plan: Skilled nursing facility when clinically improved, renal function close to baseline, improvement with  shortness of breath and per nephrology and cardiology and pending palliative care consultation outcome..     Consultants:   Nephrology: Dr. Justin Mend 05/01/2017  Cardiology: Dr. Percival Spanish 05/03/2017   Palliative care pending  Procedures:   Chest x-ray 05/01/2017  VQ scan 05/02/2017  Lower extremity Dopplers 05/04/2017  Antimicrobials:   None   Subjective: Patient in chair.  States still extremely short of breath on minimal exertion.  Some improvement since admission.  Denies any chest pain.  States shortness of breath is usually worse early in the mornings.    Objective: Vitals:   05/06/17 2029 05/06/17 2154 05/07/17 0509 05/07/17 0926  BP: (!) 147/51 (!) 161/56 (!) 154/49   Pulse:  69  80   Resp: 18  20   Temp: 98.2 F (36.8 C)  98.2 F (36.8 C)   TempSrc: Oral  Oral   SpO2: 97%  100% 98%  Weight:   64.5 kg (142 lb 3.2 oz)   Height:        Intake/Output Summary (Last 24 hours) at 05/07/2017 1223 Last data filed at 05/07/2017 1040 Gross per 24 hour  Intake 840 ml  Output 2000 ml  Net -1160 ml   Filed Weights   05/05/17 0600 05/06/17 0520 05/07/17 0509  Weight: 66.8 kg (147 lb 3.2 oz) 66.5 kg (146 lb 9.7 oz) 64.5 kg (142 lb 3.2 oz)    Examination:  General exam: NAD Respiratory system: Scattered crackles diffusely.  No wheezing.  Poor to fair air movement.  No rhonchi.   Cardiovascular system: Regular rate and rhythm no murmurs rubs or gallops.  Positive JVD.  No lower extremity edema.  Gastrointestinal system: Abdomen is soft, nontender, nondistended, positive bowel sounds.  No rebound.  No guarding.  Central nervous system: Alert and oriented. No focal neurological deficits. Extremities: Symmetric 5 x 5 power. Skin: No rashes, lesions or ulcers Psychiatry: Judgement and insight appear normal. Mood & affect appropriate.     Data Reviewed: I have personally reviewed following labs and imaging studies  CBC: Recent Labs  Lab 05/01/17 1610 05/03/17 0515 05/04/17 0516 05/05/17 0814  WBC 5.5 6.0 6.7 7.3  NEUTROABS  --  4.1  --   --   HGB 9.3* 8.9* 8.5* 8.7*  HCT 28.7* 27.3* 25.8* 27.1*  MCV 98.6 98.2 100.0 100.0  PLT 136* 136* 121* 073*   Basic Metabolic Panel: Recent Labs  Lab 05/03/17 0515 05/04/17 0516 05/05/17 0814 05/06/17 0706 05/07/17 0533  NA 129* 131* 132* 130* 132*  K 4.5 5.0 4.8 4.6 4.7  CL 96* 99* 99* 95* 97*  CO2 23 23 25 25 26   GLUCOSE 106* 118* 126* 109* 149*  BUN 92* 82* 76* 71* 72*  CREATININE 2.32* 2.20* 2.07* 1.87* 2.00*  CALCIUM 8.7* 8.8* 9.2 8.8* 9.0  PHOS 3.9  --  3.7 3.7 3.2   GFR: Estimated Creatinine Clearance: 15.4 mL/min (A) (by C-G formula based on SCr of 2 mg/dL (H)). Liver Function Tests: Recent Labs    Lab 05/03/17 0515 05/05/17 0814 05/06/17 0706 05/07/17 0533  ALBUMIN 3.0* 2.9* 2.8* 2.7*   No results for input(s): LIPASE, AMYLASE in the last 168 hours. No results for input(s): AMMONIA in the last 168 hours. Coagulation Profile: No results for input(s): INR, PROTIME in the last 168 hours. Cardiac Enzymes: Recent Labs  Lab 05/02/17 2113 05/02/17 2323  TROPONINI <0.03 <0.03   BNP (last 3 results) No results for input(s): PROBNP in the last 8760 hours. HbA1C: No results for  input(s): HGBA1C in the last 72 hours. CBG: Recent Labs  Lab 05/06/17 1203 05/06/17 1602 05/06/17 2048 05/07/17 0722 05/07/17 1156  GLUCAP 172* 169* 136* 122* 151*   Lipid Profile: No results for input(s): CHOL, HDL, LDLCALC, TRIG, CHOLHDL, LDLDIRECT in the last 72 hours. Thyroid Function Tests: Recent Labs    05/05/17 0814  TSH 2.437   Anemia Panel: No results for input(s): VITAMINB12, FOLATE, FERRITIN, TIBC, IRON, RETICCTPCT in the last 72 hours. Sepsis Labs: No results for input(s): PROCALCITON, LATICACIDVEN in the last 168 hours.  Recent Results (from the past 240 hour(s))  MRSA PCR Screening     Status: None   Collection Time: 05/02/17 10:04 PM  Result Value Ref Range Status   MRSA by PCR NEGATIVE NEGATIVE Final    Comment:        The GeneXpert MRSA Assay (FDA approved for NASAL specimens only), is one component of a comprehensive MRSA colonization surveillance program. It is not intended to diagnose MRSA infection nor to guide or monitor treatment for MRSA infections. Performed at [facility], [place] [address], [place], [place] [address]          Radiology Studies: Dg Chest Port 1 View  Result Date: 05/07/2017 CLINICAL DATA:  Shortness of breath, wheezing. EXAM: PORTABLE CHEST 1 VIEW COMPARISON:  Radiographs of May 01, 2017. FINDINGS: Stable cardiomegaly with central pulmonary vascular congestion. Stable bibasilar subsegmental atelectasis is noted with minimal  pleural effusions. No pneumothorax is noted. Atherosclerosis of thoracic aorta is noted. Bony thorax is unremarkable. IMPRESSION: Stable cardiomegaly with central pulmonary vascular congestion. Stable bibasilar subsegmental atelectasis with minimal pleural effusions. Aortic Atherosclerosis (ICD10-I70.0). Electronically Signed   By: Marijo Conception, M.D.   On: 05/07/2017 09:13        Scheduled Meds: . brinzolamide  1 drop Right Eye BID  . budesonide (PULMICORT) nebulizer solution  0.5 mg Nebulization BID  . carvedilol  12.5 mg Oral BID WC  . dicyclomine  10 mg Oral TID AC  . doxazosin  2 mg Oral QHS  . feeding supplement (ENSURE ENLIVE)  237 mL Oral BID BM  . ferrous sulfate  325 mg Oral Daily  . fluticasone  2 spray Each Nare BID  . furosemide  40 mg Oral Daily  . guaiFENesin  600 mg Oral QHS  . heparin  5,000 Units Subcutaneous Q8H  . hydrALAZINE  100 mg Oral TID  . insulin aspart  0-9 Units Subcutaneous TID WC  . isosorbide mononitrate  240 mg Oral Daily  . latanoprost  1 drop Right Eye QHS  . montelukast  10 mg Oral QHS  . multivitamin   Oral BID  . oxymetazoline  2 spray Each Nare BID  . pantoprazole  40 mg Oral Daily  . polyethylene glycol  17 g Oral Daily  . senna-docusate  1 tablet Oral BID  . sodium bicarbonate  650 mg Oral BID  . timolol  1 drop Right Eye BID  . traZODone  75 mg Oral QHS  . vitamin B-12  1,000 mcg Oral Daily  . vitamin C  500 mg Oral Daily   Continuous Infusions:    LOS: 6 days    Time spent: 35 minutes    Irine Seal, MD [facility] Pager [phone]  If 7PM-7AM, please contact night-coverage [email or web address] Password [facility] 05/07/2017, 12:23 PM

## 2017-05-07 NOTE — Consult Note (Signed)
Consultation Note Date: 05/07/2017   Patient Name: Kelly Ruiz  DOB: [birth year]  MRN: [id number]  Age / Sex: 84 y.o., female  PCP: Ronita Hipps, MD Referring Physician: Eugenie Filler, MD  Reason for Consultation: Establishing goals of care  HPI/Patient Profile: 84 y.o. female  with past medical history of diastolic heart failure, CAD, CKD stage 4, DM, HTN admitted on 05/01/2017 with SOB, weakness with with worsening renal failure and heart failure exacerbation.   Clinical Assessment and Goals of Care: I met today with Kelly Ruiz as well as her son, Kelly Ruiz, at bedside. I explained palliative care and we reviewed Kelly Ruiz's current issues with maintaining fluid status with CHF and renal failure and that this balance is likely to only become more difficult.   We discussed options on where to go from the hospital including SNF rehab, home with palliative care (unsure outpatient palliative is available in her area [place]), and home with hospice in detail. I explained that these options are based on her goals for herself and what she wants. Discussed aggressive care vs comfort care.   After discussing our options Kelly Ruiz shares that she does not feel up to trying rehab and would rather go home. They both admit that this seems to be becoming a cycle. Kelly Ruiz also admits that she does not believe this is going to get much better and she would like to be at home. She has caregivers 24/7 Mon-Fri. They have a lot of questions about hospice in which their particular hospice of choice would be better to answer. They are leaning towards home with hospice as their goal is for support and QOL and to minimize suffering. Kelly Ruiz is concerned as his brother-in-law passed away recently from cancer and had hospice and Kelly Ruiz felt that he suffered at EOL. We discussed that hospice should be able to prevent this. We also  discussed the use of hospice facility in the past days or couple weeks to assist with comfort.   I called to arrange representative from [facility] to meet with them and further answer their questions so that they can make a fully informed decision. All questions/concerns addressed and emotional support provided.   Primary Decision Maker PATIENT, son Kelly Ruiz is main support but she does have a husband 52 yo    SUMMARY OF RECOMMENDATIONS   - DNR - Most likely home with hospice once medically optimized  Code Status/Advance Care Planning:  DNR  Symptom Management:   Insomnia: Trazodone 75 mg qhs.   Generalized pain: Tylenol 1000 mg TID. OxyIR 5 mg every 6 hours prn.   SOB: OxyIR 5 mg every 6 hours prn if not relieved by Lasix and oxygen.   Bowel Regimen: Senokot-S daily BID.   Palliative Prophylaxis:   Aspiration, Bowel Regimen, Delirium Protocol, Frequent Pain Assessment and Turn Reposition  Additional Recommendations (Limitations, Scope, Preferences):  Avoid Hospitalization  Psycho-social/Spiritual:   Desire for further Chaplaincy support:no  Additional Recommendations: Caregiving  Support/Resources, Education on Hospice and [facility]  Prognosis:   < 6 months  Discharge Planning: Likely home with hospice.      Primary Diagnoses: Present on Admission: . CKD (chronic kidney disease), stage IV ([place]) . DM (diabetes mellitus), type 2 with renal complications ([place]) . AKI (acute kidney injury) ([place]) . Chronic diastolic congestive heart failure ([place]) . Essential hypertension . Metabolic acidosis, NAG, failure of bicarbonate regeneration . Hyponatremia . Hyperkalemia . Dyspnea, unspecified . Acute renal failure superimposed on stage 4 chronic kidney disease ([place]) . Acute on chronic diastolic HF (heart failure) ([place])   I have reviewed the medical record, interviewed the patient and family, and examined the patient. The following aspects are  pertinent.  Past Medical History:  Diagnosis Date  . Anemia   . Arthritis    "mostly in my knees, hands/fingers" (05/02/2017)  . Chest pain with moderate risk of acute coronary syndrome 11/08/2015  . CHF (congestive heart failure) (HCC)    Diastolic. Precipitated by Actos  . CKD (chronic kidney disease), stage IV (HCC)    mild-moderate. GFR was 35 in 02/12  . Coronary artery disease 02/2010   Last Lexiscan 11/2015 low risk.   [name] Dyslipidemia   . Dyspnea   . GERD (gastroesophageal reflux disease)   . History of blood transfusion    "related to anemia" (05/02/2017)  . Hypertension   . Nasal polyps   . Pneumonia    "I don't remember but 1 time" (05/02/2017)  . Type II diabetes mellitus ([place])   . Unspecified hypertensive heart disease without heart failure    Social History   Socioeconomic History  . Marital status: Married    Spouse name: Not on file  . Number of children: 3  . Years of education: Not on file  . Highest education level: Not on file  Occupational History  . Occupation: retired  Scientific laboratory technician  . Financial resource strain: Not on file  . Food insecurity:    Worry: Not on file    Inability: Not on file  . Transportation needs:    Medical: Not on file    Non-medical: Not on file  Tobacco Use  . Smoking status: Never Smoker  . Smokeless tobacco: Never Used  Substance and Sexual Activity  . Alcohol use: No    Alcohol/week: 0.0 oz  . Drug use: No  . Sexual activity: Not on file  Lifestyle  . Physical activity:    Days per week: Not on file    Minutes per session: Not on file  . Stress: Not on file  Relationships  . Social connections:    Talks on phone: Not on file    Gets together: Not on file    Attends religious service: Not on file    Active member of club or organization: Not on file    Attends meetings of clubs or organizations: Not on file    Relationship status: Not on file  Other Topics Concern  . Not on file  Social History Narrative  .  Not on file   Family History  Problem Relation Age of Onset  . Diabetes Unknown   . Hypertension Unknown   . Stroke Unknown   . Asthma Mother   . Allergic rhinitis Mother   . Congestive Heart Failure Mother   . Stroke Mother    Scheduled Meds: . brinzolamide  1 drop Right Eye BID  . budesonide (PULMICORT) nebulizer solution  0.5 mg Nebulization BID  . carvedilol  12.5 mg Oral BID WC  .  dicyclomine  10 mg Oral TID AC  . doxazosin  2 mg Oral QHS  . feeding supplement (ENSURE ENLIVE)  237 mL Oral BID BM  . ferrous sulfate  325 mg Oral Daily  . fluticasone  2 spray Each Nare BID  . furosemide  40 mg Oral Daily  . guaiFENesin  600 mg Oral QHS  . heparin  5,000 Units Subcutaneous Q8H  . hydrALAZINE  100 mg Oral TID  . insulin aspart  0-9 Units Subcutaneous TID WC  . isosorbide mononitrate  240 mg Oral Daily  . latanoprost  1 drop Right Eye QHS  . montelukast  10 mg Oral QHS  . multivitamin   Oral BID  . oxymetazoline  2 spray Each Nare BID  . pantoprazole  40 mg Oral Daily  . polyethylene glycol  17 g Oral Daily  . senna-docusate  1 tablet Oral BID  . sodium bicarbonate  650 mg Oral BID  . timolol  1 drop Right Eye BID  . traZODone  75 mg Oral QHS  . vitamin B-12  1,000 mcg Oral Daily  . vitamin C  500 mg Oral Daily   Continuous Infusions: PRN Meds:.acetaminophen **OR** acetaminophen, ipratropium-albuterol, ipratropium-albuterol, ondansetron **OR** ondansetron (ZOFRAN) IV Allergies  Allergen Reactions  . Actos [Pioglitazone Hydrochloride]   . Felodipine Er   . Gabapentin Other (See Comments)    Went to sleep and could not wake up  . Hctz [Hydrochlorothiazide]   . Lisinopril   . Plendil [Felodipine]   . Amlodipine Besylate Other (See Comments) and Hives  . Brimonidine Tartrate Other (See Comments)    Eye redness and itchiness, blurred vision  . Clonidine Derivatives Rash  . Combigan [Brimonidine Tartrate-Timolol] Other (See Comments)    Eye redness and itchiness,  blurred vision  . Dorzolamide Other (See Comments)    Increased eye pressure, blurred vision  . Monopril [Fosinopril Sodium] Cough  . Norvasc [Amlodipine Besylate] Hives  . Penicillins Rash    Has patient had a PCN reaction causing immediate rash, facial/tongue/throat swelling, SOB or lightheadedness with hypotension: unknown Has patient had a PCN reaction causing severe rash involving mucus membranes or skin necrosis: unknown Has patient had a PCN reaction that required hospitalization: unknown Has patient had a PCN reaction occurring within the last 10 years: no If all of the above answers are "NO", then may proceed with Cephalosporin use.    Review of Systems  Constitutional: Positive for activity change, appetite change, fatigue and unexpected weight change.  Respiratory: Positive for shortness of breath.   Neurological: Positive for weakness.  Psychiatric/Behavioral: Positive for sleep disturbance.    Physical Exam  Constitutional: She is oriented to person, place, and time. She appears well-developed.  HENT:  Head: Normocephalic and atraumatic.  Cardiovascular: Normal rate and regular rhythm.  Pulmonary/Chest: No accessory muscle usage. No tachypnea. She is in respiratory distress.  Mild distress/SOB while sitting in chair.   Abdominal: Normal appearance.  Neurological: She is alert and oriented to person, place, and time.  Nursing note and vitals reviewed.   Vital Signs: BP (!) 154/49 (BP Location: Right Arm)   Pulse 80   Temp 98.2 F (36.8 C) (Oral)   Resp 20   Ht 5' (1.524 m)   Wt 64.5 kg (142 lb 3.2 oz)   SpO2 98%   BMI 27.77 kg/m  Pain Scale: 0-10   Pain Score: 5    SpO2: SpO2: 98 % O2 Device:SpO2: 98 % O2 Flow Rate: .O2 Flow Rate (  L/min): 2 L/min  IO: Intake/output summary:   Intake/Output Summary (Last 24 hours) at 05/07/2017 1354 Last data filed at 05/07/2017 1040 Gross per 24 hour  Intake 840 ml  Output 2000 ml  Net -1160 ml    LBM: Last BM  Date: 05/07/17 Baseline Weight: Weight: 67 kg (147 lb 12.8 oz) Most recent weight: Weight: 64.5 kg (142 lb 3.2 oz)     Palliative Assessment/Data:     Time Total: 75 min  Greater than 50%  of this time was spent counseling and coordinating care related to the above assessment and plan.  Signed by: Vinie Sill, NP Palliative Medicine Team Pager # [phone] (M-F 8a-5p) Team Phone # [phone] (Nights/Weekends)

## 2017-05-07 NOTE — Progress Notes (Addendum)
Progress Note  Patient Name: Kelly Ruiz Date of Encounter: 05/07/2017  Primary Cardiologist: Minus Breeding, MD   Subjective   Pt didn't sleep well last night. Head hurts and she felt SOB with chest heaviness. O2 [place] applied and she feels better, chest heaviness resolved with O2.  Inpatient Medications    Scheduled Meds: . brinzolamide  1 drop Right Eye BID  . budesonide (PULMICORT) nebulizer solution  0.5 mg Nebulization BID  . carvedilol  6.25 mg Oral BID WC  . dicyclomine  10 mg Oral TID AC  . doxazosin  2 mg Oral QHS  . feeding supplement (ENSURE ENLIVE)  237 mL Oral BID BM  . ferrous sulfate  325 mg Oral Daily  . fluticasone  2 spray Each Nare BID  . furosemide  40 mg Oral Daily  . guaiFENesin  600 mg Oral QHS  . heparin  5,000 Units Subcutaneous Q8H  . hydrALAZINE  100 mg Oral TID  . insulin aspart  0-9 Units Subcutaneous TID WC  . isosorbide mononitrate  240 mg Oral Daily  . latanoprost  1 drop Right Eye QHS  . montelukast  10 mg Oral QHS  . multivitamin   Oral BID  . oxymetazoline  2 spray Each Nare BID  . pantoprazole  40 mg Oral Daily  . polyethylene glycol  17 g Oral Daily  . senna-docusate  1 tablet Oral BID  . sodium bicarbonate  650 mg Oral BID  . timolol  1 drop Right Eye BID  . traZODone  75 mg Oral QHS  . vitamin B-12  1,000 mcg Oral Daily  . vitamin C  500 mg Oral Daily   Continuous Infusions:  PRN Meds: acetaminophen **OR** acetaminophen, ipratropium-albuterol, ipratropium-albuterol, ondansetron **OR** ondansetron (ZOFRAN) IV   Vital Signs    Vitals:   05/06/17 2006 05/06/17 2029 05/06/17 2154 05/07/17 0509  BP:  (!) 147/51 (!) 161/56 (!) 154/49  Pulse: 63 69  80  Resp: 16 18  20   Temp:  98.2 F (36.8 C)  98.2 F (36.8 C)  TempSrc:  Oral  Oral  SpO2: 98% 97%  100%  Weight:    142 lb 3.2 oz (64.5 kg)  Height:        Intake/Output Summary (Last 24 hours) at 05/07/2017 0713 Last data filed at 05/07/2017 0559 Gross per 24 hour    Intake 840 ml  Output 2000 ml  Net -1160 ml   Filed Weights   05/05/17 0600 05/06/17 0520 05/07/17 0509  Weight: 147 lb 3.2 oz (66.8 kg) 146 lb 9.7 oz (66.5 kg) 142 lb 3.2 oz (64.5 kg)    Telemetry    sinus - Personally Reviewed  ECG    No new tracings - Personally Reviewed  Physical Exam   GEN: No acute distress.   Neck: No JVD Cardiac: RRR,  II/VI sytolic murmur  Respiratory: respirations unlabored, crackles in bases. GI: Soft, nontender, non-distended  MS: No edema; No deformity. Neuro:  Nonfocal  Psych: Normal affect   Labs    Chemistry Recent Labs  Lab 05/05/17 0814 05/06/17 0706 05/07/17 0533  NA 132* 130* 132*  K 4.8 4.6 4.7  CL 99* 95* 97*  CO2 25 25 26   GLUCOSE 126* 109* 149*  BUN 76* 71* 72*  CREATININE 2.07* 1.87* 2.00*  CALCIUM 9.2 8.8* 9.0  ALBUMIN 2.9* 2.8* 2.7*  GFRNONAA 20* 22* 21*  GFRAA 23* 26* 24*  ANIONGAP 8 10 9      Hematology Recent Labs  Lab 05/03/17 0515 05/04/17 0516 05/05/17 0814  WBC 6.0 6.7 7.3  RBC 2.78* 2.58* 2.71*  HGB 8.9* 8.5* 8.7*  HCT 27.3* 25.8* 27.1*  MCV 98.2 100.0 100.0  MCH 32.0 32.9 32.1  MCHC 32.6 32.9 32.1  RDW 13.3 13.0 13.0  PLT 136* 121* 126*    Cardiac Enzymes Recent Labs  Lab 05/02/17 2113 05/02/17 2323  TROPONINI <0.03 <0.03    Recent Labs  Lab 05/01/17 1607  TROPIPOC 0.01     BNP Recent Labs  Lab 05/01/17 1612  BNP 517.7*     DDimer No results for input(s): DDIMER in the last 168 hours.   Radiology    No results found.  Cardiac Studies   Echo 03/31/17: Study Conclusions - Left ventricle: The cavity size was normal. Wall thickness was increased in a pattern of moderate LVH. Systolic function was normal. The estimated ejection fraction was in the range of 60% to 65%. Wall motion was normal; there were no regional wall motion abnormalities. Features are consistent with a pseudonormal left ventricular filling pattern, with concomitant abnormal relaxation  and increased filling pressure (grade 2 diastolic dysfunction). - Mitral valve: Severely calcified annulus. There was moderate regurgitation. - Left atrium: The atrium was moderately dilated. - Pulmonary arteries: Systolic pressure was moderately increased. PA peak pressure: 50 mm Hg (S).  Patient Profile     84 y.o. female with a hx of chronic diastolic dysfunction, stage IV CKD, HTN and T2DM, admitted for acute on chronic renal failure,who is being seen formanagement of chronic diastolic HF andevaluation fordyspnea.  Assessment & Plan    1. Acute on chronic diastolic heart failure - pt weight is 142 lbs from 147-150 lbs on admission - pt is overall net negative 1.1 L with 2 L urine output yesterday - agree with lasix 40 mg PO daily - fluid balance challenging given renal function  Close f/u of wt, I/O and Cr    2. Acute on chronic  - sCr 2.00 (1.87) - K is stable - continue current diuresis per nephrology  3. Shortness of breath and chest heaviness - pt went to the bathroom at 0200 and was not able to sleep after - felt chest heaviness and short of breath - both relieved with supplemental O2 - crackles in bases greater than yesterday - will obtain CXR this morning   Consult placed to palliative care yesterday for goals of care.     For questions or updates, please contact [place] Please consult [email or web address] for contact info under Cardiology/STEMI.      Signed, Tami Lin Duke, PA  05/07/2017, 7:13 AM    Pt seen and examained   I agree with findings as noted by A Duke   Pt not feeling good today  Nonspecific ON exam:  Lungs are rel clear  Cardiac ex:   RRR   II/VI systolic murmru   Abd Supple   Ext with triv edema Renal has seen pt   REcomm continuing po lasix   Palliative care has been consulted. No new recommendations.    Dorris Carnes

## 2017-05-07 NOTE — Clinical Social Work Note (Signed)
[facility]'s admissions coordinator is not in the office today and no one is covering her according to the Network engineer. CSW left voicemail to check status of referral determination.  Dayton Scrape, [place]

## 2017-05-07 NOTE — Progress Notes (Signed)
[facility] Progress Note    Assessment/ Plan:   1. AKI on CKD stage IV:  Cr stable, 2.0.  Likely d/t volume depletion and Bactrim use.  Patient nonoliguric with UOP -2.0 L last 24 hours.  Currently on Lasix 40 mg PO daily for known HF.  Avoid nephrotoxic agents.  Continue current diuresis plan.  Will likely need to be discharged on current Lasix dose and follow-up with nephrology outpatient.  2.  Acute on chronic diastolic heart failure: Continues to have adequate urine output with diuresis.  Weight is down from admission approaching dry weight.  Per cardiology.  3.  Hyponatremia: Improved, 132.  Suspect this will continue to improve given improvement of AKI.  4.  Metabolic acidosis: Improved.  S/p bicarbonate drip.  Currently on bicarbonate tablets.  5.  Hyperkalemia: Resolved.  Likely related to Bactrim use.  Subjective:   Patient with some SOB with feelings of chest heaviness resolved following O2 earlier this morning.  No other complaints.   Objective:   BP (!) 154/49 (BP Location: Right Arm)   Pulse 80   Temp 98.2 F (36.8 C) (Oral)   Resp 20   Ht 5' (1.524 m)   Wt 142 lb 3.2 oz (64.5 kg)   SpO2 98%   BMI 27.77 kg/m   Intake/Output Summary (Last 24 hours) at 05/07/2017 9485 Last data filed at 05/07/2017 0559 Gross per 24 hour  Intake 840 ml  Output 2000 ml  Net -1160 ml   Weight change: -4 lb 6.5 oz (-2 kg)  Physical Exam: General: well nourished, well developed, NAD with non-toxic appearance HEENT: normocephalic, atraumatic, moist mucous membranes Neck: supple, no JVD Cardiovascular: regular rate and rhythm without murmurs, rubs, or gallops Lungs: bibasilar crackles with normal work of breathing on 2 L [place] Abdomen: soft, non-tender, non-distended, normoactive bowel sounds Skin: warm, dry, no rashes or lesions, cap refill < 2 seconds Extremities: warm and well perfused, normal tone, trace LE edema  Imaging: Dg Chest Port 1 View  Result Date:  05/07/2017 CLINICAL DATA:  Shortness of breath, wheezing. EXAM: PORTABLE CHEST 1 VIEW COMPARISON:  Radiographs of May 01, 2017. FINDINGS: Stable cardiomegaly with central pulmonary vascular congestion. Stable bibasilar subsegmental atelectasis is noted with minimal pleural effusions. No pneumothorax is noted. Atherosclerosis of thoracic aorta is noted. Bony thorax is unremarkable. IMPRESSION: Stable cardiomegaly with central pulmonary vascular congestion. Stable bibasilar subsegmental atelectasis with minimal pleural effusions. Aortic Atherosclerosis (ICD10-I70.0). Electronically Signed   By: Marijo Conception, M.D.   On: 05/07/2017 09:13    Labs: BMET Recent Labs  Lab 05/01/17 1610 05/02/17 0209 05/02/17 0440 05/03/17 0515 05/04/17 0516 05/05/17 0814 05/06/17 0706 05/07/17 0533  NA 125*  --  126* 129* 131* 132* 130* 132*  K 5.8* 5.0 5.0 4.5 5.0 4.8 4.6 4.7  CL 98*  --  98* 96* 99* 99* 95* 97*  CO2 17*  --  18* 23 23 25 25 26   GLUCOSE 173*  --  101* 106* 118* 126* 109* 149*  BUN 91*  --  94* 92* 82* 76* 71* 72*  CREATININE 3.07*  --  2.94* 2.32* 2.20* 2.07* 1.87* 2.00*  CALCIUM 8.8*  --  8.1* 8.7* 8.8* 9.2 8.8* 9.0  PHOS  --   --   --  3.9  --  3.7 3.7 3.2   CBC Recent Labs  Lab 05/01/17 1610 05/03/17 0515 05/04/17 0516 05/05/17 0814  WBC 5.5 6.0 6.7 7.3  NEUTROABS  --  4.1  --   --  HGB 9.3* 8.9* 8.5* 8.7*  HCT 28.7* 27.3* 25.8* 27.1*  MCV 98.6 98.2 100.0 100.0  PLT 136* 136* 121* 126*    Medications:    . brinzolamide  1 drop Right Eye BID  . budesonide (PULMICORT) nebulizer solution  0.5 mg Nebulization BID  . carvedilol  6.25 mg Oral BID WC  . dicyclomine  10 mg Oral TID AC  . doxazosin  2 mg Oral QHS  . feeding supplement (ENSURE ENLIVE)  237 mL Oral BID BM  . ferrous sulfate  325 mg Oral Daily  . fluticasone  2 spray Each Nare BID  . furosemide  40 mg Oral Daily  . guaiFENesin  600 mg Oral QHS  . heparin  5,000 Units Subcutaneous Q8H  . hydrALAZINE  100 mg  Oral TID  . insulin aspart  0-9 Units Subcutaneous TID WC  . isosorbide mononitrate  240 mg Oral Daily  . latanoprost  1 drop Right Eye QHS  . montelukast  10 mg Oral QHS  . multivitamin   Oral BID  . oxymetazoline  2 spray Each Nare BID  . pantoprazole  40 mg Oral Daily  . polyethylene glycol  17 g Oral Daily  . senna-docusate  1 tablet Oral BID  . sodium bicarbonate  650 mg Oral BID  . timolol  1 drop Right Eye BID  . traZODone  75 mg Oral QHS  . vitamin B-12  1,000 mcg Oral Daily  . vitamin C  500 mg Oral Daily     Harriet [place], DO, PGY-2

## 2017-05-08 DIAGNOSIS — N170 Acute kidney failure with tubular necrosis: Secondary | ICD-10-CM

## 2017-05-08 LAB — GLUCOSE, CAPILLARY
GLUCOSE-CAPILLARY: 122 mg/dL — AB (ref 65–99)
GLUCOSE-CAPILLARY: 104 mg/dL — AB (ref 65–99)
Glucose-Capillary: 211 mg/dL — ABNORMAL HIGH (ref 65–99)
Glucose-Capillary: 157 mg/dL — ABNORMAL HIGH (ref 65–99)

## 2017-05-08 LAB — RENAL FUNCTION PANEL
Albumin: 2.7 g/dL — ABNORMAL LOW (ref 3.5–5.0)
Anion gap: 10 (ref 5–15)
BUN: 74 mg/dL — ABNORMAL HIGH (ref 6–20)
CALCIUM: 9.1 mg/dL (ref 8.9–10.3)
CHLORIDE: 97 mmol/L — AB (ref 101–111)
CO2: 26 mmol/L (ref 22–32)
CREATININE: 2.03 mg/dL — AB (ref 0.44–1.00)
GFR, EST AFRICAN AMERICAN: 24 mL/min — AB (ref >60)
GFR, EST NON AFRICAN AMERICAN: 20 mL/min — AB (ref >60)
Glucose, Bld: 117 mg/dL — ABNORMAL HIGH (ref 65–99)
Phosphorus: 4 mg/dL (ref 2.5–4.6)
Potassium: 4.8 mmol/L (ref 3.5–5.1)
SODIUM: 133 mmol/L — AB (ref 135–145)

## 2017-05-08 MED ORDER — CARVEDILOL 12.5 MG PO TABS: 12.5000 mg | ORAL_TABLET | Freq: Two times a day (BID) | ORAL | 0 refills | Status: AC

## 2017-05-08 MED ORDER — SENNOSIDES-DOCUSATE SODIUM 8.6-50 MG PO TABS: 1.0000 | ORAL_TABLET | Freq: Every evening | ORAL | Status: DC | PRN

## 2017-05-08 MED ORDER — POLYETHYLENE GLYCOL 3350 17 G PO PACK: 17.0000 g | PACK | Freq: Every day | ORAL | Status: DC | PRN

## 2017-05-08 NOTE — Progress Notes (Signed)
No complains or concerns during the night.   Will continue to monitor.  Amandy Chubbuck, RN

## 2017-05-08 NOTE — Progress Notes (Signed)
Physical Therapy Treatment Patient Details Name: Kelly Ruiz MRN: [id number] DOB: February 10, 1923 Today's Date: 05/08/2017    History of Present Illness Pt is a 84 y/o female admitted secondary to SOB and AKI. PMH including but not limited to diastolic CHF, CKD stage 4, HTN, DM2.    PT Comments    Pt limited by fatigue this session, and only performing stand pivot X2. Required min to min guard A for mobility using RW. Per pt, family will be having discussions about going home with hospice, as pt does not feel like further skilled therapy will help. Pt currently wanting to go home instead of SNF, so recommendations updated. Pt reports she has 24/7 assist at home. Will continue to follow acutely to maximize functional mobility independence and safety.   Follow Up Recommendations  No PT follow up;Supervision/Assistance - 24 hour(likely home with hospice; pt refusing SNF )     Equipment Recommendations  None recommended by PT    Recommendations for Other Services       Precautions / Restrictions Precautions Precautions: Fall Restrictions Weight Bearing Restrictions: No    Mobility  Bed Mobility Overal bed mobility: Needs Assistance Bed Mobility: Sit to Supine       Sit to supine: Min assist   General bed mobility comments: Min A for LE lift asssit for return to supine.   Transfers Overall transfer level: Needs assistance Equipment used: Rolling walker (2 wheeled) Transfers: Sit to/from [facility] Sit to Stand: Min assist Stand pivot transfers: Min guard       General transfer comment: Min A for lift assist and steadying to stand. Performed stand pivot transfer X 2 to [facility] and then to bed. Min guard for steadying assist throughout transfer. Pt reports she is too tired to walk today, so further mobility deferred.   Ambulation/Gait                 Stairs             Wheelchair Mobility    Modified Rankin (Stroke Patients Only)        Balance Overall balance assessment: Needs assistance Sitting-balance support: Feet supported Sitting balance-Leahy Scale: Good     Standing balance support: Bilateral upper extremity supported;During functional activity Standing balance-Leahy Scale: Poor Standing balance comment: Reliant on BUE support.                             Cognition Arousal/Alertness: Awake/alert Behavior During Therapy: WFL for tasks assessed/performed Overall Cognitive Status: Within Functional Limits for tasks assessed                                        Exercises      General Comments General comments (skin integrity, edema, etc.): Per pt, family is having hospice discussions later this afternoon to determine if pt will go home with hospice. Pt does not feel like further SNF will help her, as she has been a few times before.       Pertinent Vitals/Pain Pain Assessment: No/denies pain    Home Living                      Prior Function            PT Goals (current goals can now be found in the care plan section) Acute  Rehab PT Goals Patient Stated Goal: to go home  PT Goal Formulation: With patient/family Time For Goal Achievement: 05/17/17 Potential to Achieve Goals: Good Progress towards PT goals: Not progressing toward goals - comment(increased fatigue )    Frequency    Min 3X/week      PT Plan Discharge plan needs to be updated    Co-evaluation              AM-PAC PT "6 Clicks" Daily Activity  Outcome Measure  Difficulty turning over in bed (including adjusting bedclothes, sheets and blankets)?: A Little Difficulty moving from lying on back to sitting on the side of the bed? : A Little Difficulty sitting down on and standing up from a chair with arms (e.g., wheelchair, bedside commode, etc,.)?: Unable Help needed moving to and from a bed to chair (including a wheelchair)?: A Little Help needed walking in hospital room?: A  Little Help needed climbing 3-5 steps with a railing? : A Lot 6 Click Score: 15    End of Session Equipment Utilized During Treatment: Gait belt Activity Tolerance: Patient limited by fatigue Patient left: in bed;with call bell/phone within reach;with bed alarm set Nurse Communication: Mobility status PT Visit Diagnosis: Other abnormalities of gait and mobility (R26.89)     Time: 1045-1105 PT Time Calculation (min) (ACUTE ONLY): 20 min  Charges:  $Therapeutic Activity: 8-22 mins                    G Codes:       Leighton Ruff, PT, DPT  Acute Rehabilitation Services  Pager: [place] 05/08/2017, 12:28 PM

## 2017-05-08 NOTE — Progress Notes (Addendum)
Received consult - Please send Demographics with SS#, H&P, and palliative notes to [facility].   Clinical information faxed as requested; Aneta Mins [phone]  12:35 pm - [facility] called, they received the fax and is working on the case at this time; Aneta Mins [phone]  1:59 pm - Received call from [facility] with [facility]; family is still undecided about home hospice vs snf placement; another palliative care meeting is scheduled for today per Alysia Penna RN,MHA,BSN

## 2017-05-08 NOTE — Progress Notes (Signed)
CSW noted that family is undecided about disposition plan - home w/ hospice vs SNF, and plan to have additional palliative meeting.   In initial assessment, son requested new referral to [facility]. CSW called SNF today to inquire about bed offer, and re-sent referral. CSW awaiting bed offer, if family decides on SNF. CSW to follow up and support with disposition planning.  Estanislado Emms, [place]

## 2017-05-08 NOTE — Progress Notes (Signed)
OT Cancellation Note  Patient Details Name: Kelly Ruiz MRN: [id number] DOB: 12/07/1923   Cancelled Treatment:    Reason Eval/Treat Not Completed: Other (comment): Pt and family with other staff at this time. Will check back as able.   Norman Herrlich, MS OTR/L  Pager: [phone]   Norman Herrlich 05/08/2017, 4:25 PM

## 2017-05-08 NOTE — Progress Notes (Addendum)
PROGRESS NOTE    Kelly Ruiz  SWF:[id number] DOB: [birth year] DOA: 05/01/2017 PCP: Ronita Hipps, MD    Brief Narrative:  84 y.o.femalewith medical history significant ofdiastolic CHF, CKD stage 4, HTN, DM2. Patient recently admitted to hospitalist service from 3/12-3/21 for AKI believed to be pre-renal and diuretic induced. She was initially hydrated until 3/16 with improving kidney function, then developed acute on chronic diastolic CHF due to fluid overload. She was diuresed from that point on until discharge. Creat at discharge was 1.48 down from 3.9 on admission. It does not appear that nephrology was consulted at that time. CT at that time showed large bowel stool with atrophic kidneys She was quite anemic during the hospitalization and it appeared that she was iron deficient. She was discharged off her ARB and was given a regimen of lasix based on weight.  She was discharged from the rehab facility last week and her son noted and increase in weight and dyspnea  He gave her the extra lasix as prescribed but brought her to the hospital as she was getting weaker and had worsening dyspnea. Her labs in the ER showed K 5.8  Na 125  CO2 17  BUN 91  Cr 3.07  Ca 8.8  Glc 173         Assessment & Plan:   Principal Problem:   Acute renal failure superimposed on stage 4 chronic kidney disease (HCC) Active Problems:   Chronic diastolic congestive heart failure (HCC)   Essential hypertension   DM (diabetes mellitus), type 2 with renal complications (HCC)   Dyspnea, unspecified   Hyperkalemia   Acute on chronic diastolic HF (heart failure) (HCC)   CKD (chronic kidney disease), stage IV (HCC)   AKI (acute kidney injury) (HCC)   Metabolic acidosis, NAG, failure of bicarbonate regeneration   Hyponatremia   Weakness   Goals of care, counseling/discussion   Palliative care encounter    1 . acute on chronic kidney disease stage IV baseline creatinine 1.6-1.9  Due to volume  depletion and Bactrim use/diuretics. Nephrology following.    Per nephrology patient also likely with cardiorenal syndrome.  Patient initially did not look volume overloaded on examination.  BNP was elevated but not significantly as during last hospitalization.  Patient was given some boluses of normal saline in the ED. Patient was gently hydrated for 48 hours and IV fluids have been saline locked.  .  Renal function seems to have plateaued and currently at 2.0 from 1.87 from 3.07 on admission. Currently on Lasix 40 mg daily for heart failure. Acidosis and hyperkalemia has improved  Per Nephrology patient a very poor dialysis candidate due to debility and age.  Nephrology has signed off  2.  Dyspnea likely secondary to acute on chronic diastolic heart failure Combination of acute on chronic diastolic heart failure . Baseline weight is 142 pounds. 147-150 pounds on admission.   Chest x-ray negative for any pulmonary edema.  May be secondary to worsening renal function versus cardiac etiology versus pulmonary etiology.  Cardiac enzymes negative x3.   Recent 2D echo 03/31/2017 with a EF of 60-65%, no wall motion abnormalities, grade 2 diastolic dysfunction.  Moderate mitral valvular regurgitation.  Moderately dilated left atrium.  Systolic pressure moderately increased with a PA peak pressure of 50 mmHg.  VQ scan with low probability for PE.  Lower extremity Dopplers negative for DVT.  ABG was done which was unremarkable.  Patient seen by cardiology who do not feel patient shortness of breath  is from valvular dysfunction.  Feels may be secondary to acute on chronic heart failure in the setting of acute on chronic kidney disease.  Patient started on Lasix 40 mg daily per nephrology.  Some clinical improvement.  Patient currently 98% on room air  3.  Hypertension Blood pressure elevated.  Continue current regimen of Coreg, Cardura, hydralazine, Imdur, Lasix.  Will defer blood pressure control to cardiology and  nephrology.   4.  Hyperkalemia Likely secondary to acute on chronic renal failure.  Patient given some Kayexalate as well as IV bicarb with resolution of hyperkalemia on admission.  Potassium level at 4.7.  Continue renal diet.  Nephrology following.  5.  Weakness Questionable etiology.  May be secondary to problem #1.  PT/OT.  Needs skilled nursing facility on discharge.  6.  Hyponatremia   Likely secondary to hypervolemic hyponatremia  . Sodium levels initially improved with gentle hydration however continue to improve with diuresis.  TSH is 2.437.   [name] Continue Lasix 40 mg daily per nephrology.      7.  Metabolic acidosis status post bicarbonate drip currently on bicarbonate tablets Likely secondary to problem #1.   Per nephrology.    8.  Diabetes mellitus type 2 Patient not on insulin at home however on glyburide.  Hemoglobin A1c was 5.5 on 03/26/2017.  CBGs ranging from 122 -151.  Continue to hold glyburide.  Sliding scale insulin.    9.  Prognosis Patient with extremely delicate fluid balance.  Patient presenting with worsening shortness of breath and worsening renal function.  Patient still dyspneic with minimal exertion.  Patient on diuretics with diuresis . Palliative care following for goals of care    DVT prophylaxis: Heparin Code Status: DNR Family Communication: Updated son at bedside.    Disposition Plan:  Home with hospice after son meets with palliative care today at 4:00 pm    Consultants:   Nephrology: Dr. Justin Mend 05/01/2017  Cardiology: Dr. Percival Spanish 05/03/2017   Palliative care pending  Procedures:   Chest x-ray 05/01/2017  VQ scan 05/02/2017  Lower extremity Dopplers 05/04/2017  Antimicrobials:   None   Subjective: She actually looks good, sitting up in the chair, apparently for several hours , no cp, no sob  Objective: Vitals:   05/07/17 1941 05/07/17 2238 05/08/17 0458 05/08/17 0509  BP: (!) 173/46 (!) 126/57 (!) 183/69 (!) 156/45  Pulse: 70 64 63    Resp: 16  18   Temp: 98.1 F (36.7 C)  97.7 F (36.5 C)   TempSrc: Oral  Oral   SpO2: 98%  98%   Weight:   65.9 kg (145 lb 3.2 oz)   Height:        Intake/Output Summary (Last 24 hours) at 05/08/2017 0800 Last data filed at 05/08/2017 0751 Gross per 24 hour  Intake 600 ml  Output 1300 ml  Net -700 ml   Filed Weights   05/06/17 0520 05/07/17 0509 05/08/17 0458  Weight: 66.5 kg (146 lb 9.7 oz) 64.5 kg (142 lb 3.2 oz) 65.9 kg (145 lb 3.2 oz)    Examination:  General exam: NAD Respiratory system: Scattered crackles diffusely.  No wheezing.  Poor to fair air movement.  No rhonchi.   Cardiovascular system: Regular rate and rhythm no murmurs rubs or gallops.  Positive JVD.  No lower extremity edema.  Gastrointestinal system: Abdomen is soft, nontender, nondistended, positive bowel sounds.  No rebound.  No guarding.  Central nervous system: Alert and oriented. No focal neurological deficits. Extremities:  Symmetric 5 x 5 power. Skin: No rashes, lesions or ulcers Psychiatry: Judgement and insight appear normal. Mood & affect appropriate.     Data Reviewed: I have personally reviewed following labs and imaging studies  CBC: Recent Labs  Lab 05/01/17 1610 05/03/17 0515 05/04/17 0516 05/05/17 0814  WBC 5.5 6.0 6.7 7.3  NEUTROABS  --  4.1  --   --   HGB 9.3* 8.9* 8.5* 8.7*  HCT 28.7* 27.3* 25.8* 27.1*  MCV 98.6 98.2 100.0 100.0  PLT 136* 136* 121* 517*   Basic Metabolic Panel: Recent Labs  Lab 05/03/17 0515 05/04/17 0516 05/05/17 0814 05/06/17 0706 05/07/17 0533 05/08/17 0456  NA 129* 131* 132* 130* 132* 133*  K 4.5 5.0 4.8 4.6 4.7 4.8  CL 96* 99* 99* 95* 97* 97*  CO2 23 23 25 25 26 26   GLUCOSE 106* 118* 126* 109* 149* 117*  BUN 92* 82* 76* 71* 72* 74*  CREATININE 2.32* 2.20* 2.07* 1.87* 2.00* 2.03*  CALCIUM 8.7* 8.8* 9.2 8.8* 9.0 9.1  PHOS 3.9  --  3.7 3.7 3.2 4.0   GFR: Estimated Creatinine Clearance: 15.3 mL/min (A) (by C-G formula based on SCr of 2.03  mg/dL (H)). Liver Function Tests: Recent Labs  Lab 05/03/17 0515 05/05/17 0814 05/06/17 0706 05/07/17 0533 05/08/17 0456  ALBUMIN 3.0* 2.9* 2.8* 2.7* 2.7*   No results for input(s): LIPASE, AMYLASE in the last 168 hours. No results for input(s): AMMONIA in the last 168 hours. Coagulation Profile: No results for input(s): INR, PROTIME in the last 168 hours. Cardiac Enzymes: Recent Labs  Lab 05/02/17 2113 05/02/17 2323  TROPONINI <0.03 <0.03   BNP (last 3 results) No results for input(s): PROBNP in the last 8760 hours. HbA1C: No results for input(s): HGBA1C in the last 72 hours. CBG: Recent Labs  Lab 05/07/17 0722 05/07/17 1156 05/07/17 1658 05/07/17 2130 05/08/17 0727  GLUCAP 122* 151* 111* 148* 122*   Lipid Profile: No results for input(s): CHOL, HDL, LDLCALC, TRIG, CHOLHDL, LDLDIRECT in the last 72 hours. Thyroid Function Tests: Recent Labs    05/05/17 0814  TSH 2.437   Anemia Panel: No results for input(s): VITAMINB12, FOLATE, FERRITIN, TIBC, IRON, RETICCTPCT in the last 72 hours. Sepsis Labs: No results for input(s): PROCALCITON, LATICACIDVEN in the last 168 hours.  Recent Results (from the past 240 hour(s))  MRSA PCR Screening     Status: None   Collection Time: 05/02/17 10:04 PM  Result Value Ref Range Status   MRSA by PCR NEGATIVE NEGATIVE Final    Comment:        The GeneXpert MRSA Assay (FDA approved for NASAL specimens only), is one component of a comprehensive MRSA colonization surveillance program. It is not intended to diagnose MRSA infection nor to guide or monitor treatment for MRSA infections. Performed at [facility], [place] [address], [place], [place] [address]          Radiology Studies: Dg Chest Port 1 View  Result Date: 05/07/2017 CLINICAL DATA:  Shortness of breath, wheezing. EXAM: PORTABLE CHEST 1 VIEW COMPARISON:  Radiographs of May 01, 2017. FINDINGS: Stable cardiomegaly with central pulmonary vascular  congestion. Stable bibasilar subsegmental atelectasis is noted with minimal pleural effusions. No pneumothorax is noted. Atherosclerosis of thoracic aorta is noted. Bony thorax is unremarkable. IMPRESSION: Stable cardiomegaly with central pulmonary vascular congestion. Stable bibasilar subsegmental atelectasis with minimal pleural effusions. Aortic Atherosclerosis (ICD10-I70.0). Electronically Signed   By: Marijo Conception, M.D.   On: 05/07/2017 09:13  Scheduled Meds: . acetaminophen  1,000 mg Oral TID  . brinzolamide  1 drop Right Eye BID  . budesonide (PULMICORT) nebulizer solution  0.5 mg Nebulization BID  . carvedilol  12.5 mg Oral BID WC  . dicyclomine  10 mg Oral TID AC  . doxazosin  2 mg Oral QHS  . feeding supplement (ENSURE ENLIVE)  237 mL Oral BID BM  . ferrous sulfate  325 mg Oral Daily  . fluticasone  2 spray Each Nare BID  . furosemide  40 mg Oral Daily  . guaiFENesin  600 mg Oral QHS  . heparin  5,000 Units Subcutaneous Q8H  . hydrALAZINE  100 mg Oral TID  . insulin aspart  0-9 Units Subcutaneous TID WC  . isosorbide mononitrate  240 mg Oral Daily  . latanoprost  1 drop Right Eye QHS  . montelukast  10 mg Oral QHS  . multivitamin   Oral BID  . oxymetazoline  2 spray Each Nare BID  . pantoprazole  40 mg Oral Daily  . polyethylene glycol  17 g Oral Daily  . senna-docusate  1 tablet Oral BID  . sodium bicarbonate  650 mg Oral BID  . timolol  1 drop Right Eye BID  . traZODone  75 mg Oral QHS  . vitamin B-12  1,000 mcg Oral Daily  . vitamin C  500 mg Oral Daily   Continuous Infusions:    LOS: 7 days    Time spent: 35 minutes    Reyne Dumas, MD [facility]    If 7PM-7AM, please contact night-coverage [email or web address] Password [email or web address] 05/08/2017, 8:00 AM

## 2017-05-08 NOTE — Care Management Important Message (Signed)
Important Message  Patient Details  Name: Kelly Ruiz MRN: [id number] Date of Birth: [birth year]   Medicare Important Message Given:  Yes    Kelly Ruiz P Clarence 05/08/2017, 2:59 PM

## 2017-05-08 NOTE — Progress Notes (Signed)
Palliative:  I met again today with Ms. Lauro Regulus and son/HCPOA, Dominica Severin. Many questions regarding medications and changes - reviewed. After our discussion they have decided that they wish to pursue home with hospice. Dominica Severin will plan to take her bed down in preparation for delivery of hospital bed (also needs oxygen and nebulizer which was communicated with [place]). Planning for discharge home with hospice tomorrow. Reviewed and answered questions regarding process of transition home with hospice. They would also like ambulance transfer home tomorrow.   Ms. Pallett still SOB when head of bed not elevated. She slept much better and pain is much improved. Discussed to continue Tylenol 1000 mg TID and may taper down to efficacy at home. No other complaints at this time. She is happy to be returning home. All questions/concerns addressed. Emotional support provided.   72 min  Vinie Sill, NP Palliative Medicine Team Pager # [phone] (M-F 8a-5p) Team Phone # [phone] (Nights/Weekends)

## 2017-05-08 NOTE — Progress Notes (Addendum)
Progress Note  Patient Name: Kelly Ruiz Date of Encounter: 05/08/2017  Primary Cardiologist: Minus Breeding, MD   Subjective   Pt feels much better today, she was able to sleep last night. She is on room air.  Inpatient Medications    Scheduled Meds: . acetaminophen  1,000 mg Oral TID  . brinzolamide  1 drop Right Eye BID  . budesonide (PULMICORT) nebulizer solution  0.5 mg Nebulization BID  . carvedilol  12.5 mg Oral BID WC  . dicyclomine  10 mg Oral TID AC  . doxazosin  2 mg Oral QHS  . feeding supplement (ENSURE ENLIVE)  237 mL Oral BID BM  . ferrous sulfate  325 mg Oral Daily  . fluticasone  2 spray Each Nare BID  . furosemide  40 mg Oral Daily  . guaiFENesin  600 mg Oral QHS  . heparin  5,000 Units Subcutaneous Q8H  . hydrALAZINE  100 mg Oral TID  . insulin aspart  0-9 Units Subcutaneous TID WC  . isosorbide mononitrate  240 mg Oral Daily  . latanoprost  1 drop Right Eye QHS  . montelukast  10 mg Oral QHS  . multivitamin   Oral BID  . oxymetazoline  2 spray Each Nare BID  . pantoprazole  40 mg Oral Daily  . polyethylene glycol  17 g Oral Daily  . senna-docusate  1 tablet Oral BID  . sodium bicarbonate  650 mg Oral BID  . timolol  1 drop Right Eye BID  . traZODone  75 mg Oral QHS  . vitamin B-12  1,000 mcg Oral Daily  . vitamin C  500 mg Oral Daily   Continuous Infusions:  PRN Meds: ipratropium-albuterol, ipratropium-albuterol, ondansetron **OR** ondansetron (ZOFRAN) IV, oxyCODONE   Vital Signs    Vitals:   05/07/17 1941 05/07/17 2238 05/08/17 0458 05/08/17 0509  BP: (!) 173/46 (!) 126/57 (!) 183/69 (!) 156/45  Pulse: 70 64 63   Resp: 16  18   Temp: 98.1 F (36.7 C)  97.7 F (36.5 C)   TempSrc: Oral  Oral   SpO2: 98%  98%   Weight:   145 lb 3.2 oz (65.9 kg)   Height:        Intake/Output Summary (Last 24 hours) at 05/08/2017 0830 Last data filed at 05/08/2017 0751 Gross per 24 hour  Intake 600 ml  Output 1300 ml  Net -700 ml   Filed  Weights   05/06/17 0520 05/07/17 0509 05/08/17 0458  Weight: 146 lb 9.7 oz (66.5 kg) 142 lb 3.2 oz (64.5 kg) 145 lb 3.2 oz (65.9 kg)    Telemetry    sinus - Personally Reviewed  ECG    No new tracings - Personally Reviewed  Physical Exam   GEN: No acute distress.   Neck: No JVD Cardiac: RRR, no murmurs, rubs, or gallops.  Respiratory: Clear to auscultation bilaterally. GI: Soft, nontender, non-distended  MS: trace edema; No deformity. Neuro:  Nonfocal  Psych: Normal affect   Labs    Chemistry Recent Labs  Lab 05/06/17 0706 05/07/17 0533 05/08/17 0456  NA 130* 132* 133*  K 4.6 4.7 4.8  CL 95* 97* 97*  CO2 25 26 26   GLUCOSE 109* 149* 117*  BUN 71* 72* 74*  CREATININE 1.87* 2.00* 2.03*  CALCIUM 8.8* 9.0 9.1  ALBUMIN 2.8* 2.7* 2.7*  GFRNONAA 22* 21* 20*  GFRAA 26* 24* 24*  ANIONGAP 10 9 10      Hematology Recent Labs  Lab 05/03/17 0515  05/04/17 0516 05/05/17 0814  WBC 6.0 6.7 7.3  RBC 2.78* 2.58* 2.71*  HGB 8.9* 8.5* 8.7*  HCT 27.3* 25.8* 27.1*  MCV 98.2 100.0 100.0  MCH 32.0 32.9 32.1  MCHC 32.6 32.9 32.1  RDW 13.3 13.0 13.0  PLT 136* 121* 126*    Cardiac Enzymes Recent Labs  Lab 05/02/17 2113 05/02/17 2323  TROPONINI <0.03 <0.03    Recent Labs  Lab 05/01/17 1607  TROPIPOC 0.01     BNP Recent Labs  Lab 05/01/17 1612  BNP 517.7*     DDimer No results for input(s): DDIMER in the last 168 hours.   Radiology    Dg Chest Port 1 View  Result Date: 05/07/2017 CLINICAL DATA:  Shortness of breath, wheezing. EXAM: PORTABLE CHEST 1 VIEW COMPARISON:  Radiographs of May 01, 2017. FINDINGS: Stable cardiomegaly with central pulmonary vascular congestion. Stable bibasilar subsegmental atelectasis is noted with minimal pleural effusions. No pneumothorax is noted. Atherosclerosis of thoracic aorta is noted. Bony thorax is unremarkable. IMPRESSION: Stable cardiomegaly with central pulmonary vascular congestion. Stable bibasilar subsegmental  atelectasis with minimal pleural effusions. Aortic Atherosclerosis (ICD10-I70.0). Electronically Signed   By: Marijo Conception, M.D.   On: 05/07/2017 09:13    Cardiac Studies   Echo 03/31/17: Study Conclusions - Left ventricle: The cavity size was normal. Wall thickness was increased in a pattern of moderate LVH. Systolic function was normal. The estimated ejection fraction was in the range of 60% to 65%. Wall motion was normal; there were no regional wall motion abnormalities. Features are consistent with a pseudonormal left ventricular filling pattern, with concomitant abnormal relaxation and increased filling pressure (grade 2 diastolic dysfunction). - Mitral valve: Severely calcified annulus. There was moderate regurgitation. - Left atrium: The atrium was moderately dilated. - Pulmonary arteries: Systolic pressure was moderately increased. PA peak pressure: 50 mm Hg (S).  Patient Profile     84 y.o. female with a hx of chronic diastolic dysfunction, stage IV CKD, HTN and T2DM, admitted for acute on chronic renal failure,who is being seen formanagement of chronic diastolic HF andevaluation fordyspnea.  Assessment & Plan    1. Acute on chronic diastolic heart failure - pt weight is 145 lbs, down from 150 lbs on admission - she is overall net negative 1.6 L with 1.1 L urine output yesterday - she is doing well on the PO dose of lasix 40 mg daily, discharge with this dose, no potassium supplementation   2. Acute on chronic kidney disease stage IV - K is stable at 4.8, no potassium supplement needed - sCr 2.03 (2.00) - stable   3. Shortness of breath and chest heaviness - denies both this morning, feeling well   4. Palliative care medicine following, discussing hospice option.   OK to discharge from a cardiology standpoint. We will follow at a distance. Please call with any questions.   For questions or updates, please contact [place] Please  consult [email or web address] for contact info under Cardiology/STEMI.      Signed, Tami Lin Duke, PA  05/08/2017, 8:30 AM    Patient seen and examined   I agree with findings as noted above by A Duke  Above    Pt feeling good today  On exam:  JVP is minimally increased  Lungs are CTA  Cardiac RRR  No S3  Ext without edema  Volume status s OK  Pt comfortable    I would keep on current regmien  Note that palliative care has seen pt  OK to d/c from cardiac standpoint.  Dorris Carnes

## 2017-05-09 LAB — COMPREHENSIVE METABOLIC PANEL
ALT: 17 U/L (ref 14–54)
ANION GAP: 9 (ref 5–15)
AST: 13 U/L — ABNORMAL LOW (ref 15–41)
Albumin: 2.7 g/dL — ABNORMAL LOW (ref 3.5–5.0)
Alkaline Phosphatase: 74 U/L (ref 38–126)
BILIRUBIN TOTAL: 0.8 mg/dL (ref 0.3–1.2)
BUN: 77 mg/dL — ABNORMAL HIGH (ref 6–20)
CHLORIDE: 96 mmol/L — AB (ref 101–111)
CO2: 25 mmol/L (ref 22–32)
Calcium: 8.7 mg/dL — ABNORMAL LOW (ref 8.9–10.3)
Creatinine, Ser: 2.02 mg/dL — ABNORMAL HIGH (ref 0.44–1.00)
GFR, EST AFRICAN AMERICAN: 24 mL/min — AB (ref >60)
GFR, EST NON AFRICAN AMERICAN: 20 mL/min — AB (ref >60)
Glucose, Bld: 111 mg/dL — ABNORMAL HIGH (ref 65–99)
POTASSIUM: 4.7 mmol/L (ref 3.5–5.1)
Sodium: 130 mmol/L — ABNORMAL LOW (ref 135–145)
TOTAL PROTEIN: 5 g/dL — AB (ref 6.5–8.1)

## 2017-05-09 LAB — GLUCOSE, CAPILLARY
GLUCOSE-CAPILLARY: 109 mg/dL — AB (ref 65–99)
Glucose-Capillary: 183 mg/dL — ABNORMAL HIGH (ref 65–99)

## 2017-05-09 LAB — CBC
HCT: 25.9 % — ABNORMAL LOW (ref 36.0–46.0)
HEMOGLOBIN: 8.2 g/dL — AB (ref 12.0–15.0)
MCH: 32.2 pg (ref 26.0–34.0)
MCHC: 31.7 g/dL (ref 30.0–36.0)
MCV: 101.6 fL — ABNORMAL HIGH (ref 78.0–100.0)
Platelets: 124 10*3/uL — ABNORMAL LOW (ref 150–400)
RBC: 2.55 MIL/uL — ABNORMAL LOW (ref 3.87–5.11)
RDW: 13.2 % (ref 11.5–15.5)
WBC: 6.5 10*3/uL (ref 4.0–10.5)

## 2017-05-09 MED ORDER — PANTOPRAZOLE SODIUM 40 MG PO TBEC: 40.0000 mg | DELAYED_RELEASE_TABLET | Freq: Every day | ORAL | 1 refills | Status: AC

## 2017-05-09 MED ORDER — SODIUM BICARBONATE 650 MG PO TABS: 650.0000 mg | ORAL_TABLET | Freq: Two times a day (BID) | ORAL | 2 refills | Status: AC

## 2017-05-09 MED ORDER — SENNOSIDES-DOCUSATE SODIUM 8.6-50 MG PO TABS: 1.0000 | ORAL_TABLET | Freq: Every evening | ORAL | 1 refills | Status: AC | PRN

## 2017-05-09 MED ORDER — OXYCODONE HCL 5 MG PO TABS: 5.0000 mg | ORAL_TABLET | Freq: Four times a day (QID) | ORAL | 0 refills | Status: AC | PRN

## 2017-05-09 MED ORDER — POLYETHYLENE GLYCOL 3350 17 G PO PACK: 17.0000 g | PACK | Freq: Every day | ORAL | 0 refills | Status: AC | PRN

## 2017-05-09 MED ORDER — GLIPIZIDE 5 MG PO TABS: 2.5000 mg | ORAL_TABLET | Freq: Every day | ORAL | 0 refills | Status: AC

## 2017-05-09 NOTE — Progress Notes (Signed)
Occupational Therapy Treatment Patient Details Name: Kelly Ruiz MRN: [id number] DOB: 05-Oct-1923 Today's Date: 05/09/2017    History of present illness Pt is a 84 y/o female admitted secondary to SOB and AKI. PMH including but not limited to diastolic CHF, CKD stage 4, HTN, DM2.   OT comments  Pt making good progress with functional goals and possibly d/c home this afternoon  Follow Up Recommendations  SNF    Equipment Recommendations  None recommended by OT    Recommendations for Other Services      Precautions / Restrictions Precautions Precautions: Fall Restrictions Weight Bearing Restrictions: No       Mobility Bed Mobility Overal bed mobility: Needs Assistance Bed Mobility: Sit to Supine     Supine to sit: Min assist        Transfers Overall transfer level: Needs assistance Equipment used: Rolling walker (2 wheeled) Transfers: Sit to/from [facility] Sit to Stand: Min assist Stand pivot transfers: Min guard       General transfer comment: pt ambulated to [facility], to sink and to recliner    Balance Overall balance assessment: Needs assistance Sitting-balance support: Feet supported Sitting balance-Leahy Scale: Good     Standing balance support: Bilateral upper extremity supported;During functional activity Standing balance-Leahy Scale: Poor Standing balance comment: Reliant on BUE support.                            ADL either performed or assessed with clinical judgement   ADL Overall ADL's : Needs assistance/impaired     Grooming: Min guard;Wash/dry hands;Standing;Wash/dry face   Upper Body Bathing: Sitting;Minimal assistance Upper Body Bathing Details (indicate cue type and reason): simulated Lower Body Bathing: Moderate assistance;Sit to/from stand Lower Body Bathing Details (indicate cue type and reason): simulated         Toilet Transfer: Ambulation;RW;Minimal assistance;Min guard;BSC   Toileting- Clothing  Manipulation and Hygiene: Sit to/from stand;Minimal assistance       Functional mobility during ADLs: Minimal assistance;Min guard;Rolling walker       Vision Patient Visual Report: No change from baseline     Perception     Praxis      Cognition Arousal/Alertness: Awake/alert Behavior During Therapy: WFL for tasks assessed/performed Overall Cognitive Status: Within Functional Limits for tasks assessed                                          Exercises     Shoulder Instructions       General Comments  pt pleasant and cooperative    Pertinent Vitals/ Pain       Pain Assessment: No/denies pain  Home Living                                          Prior Functioning/Environment              Frequency  Min 2X/week        Progress Toward Goals  OT Goals(current goals can now be found in the care plan section)  Progress towards OT goals: Progressing toward goals  Acute Rehab OT Goals Patient Stated Goal: to go home   Plan Discharge plan remains appropriate    Co-evaluation  AM-PAC PT "6 Clicks" Daily Activity     Outcome Measure   Help from another person eating meals?: None Help from another person taking care of personal grooming?: A Little Help from another person toileting, which includes using toliet, bedpan, or urinal?: A Lot Help from another person bathing (including washing, rinsing, drying)?: A Lot Help from another person to put on and taking off regular upper body clothing?: A Lot Help from another person to put on and taking off regular lower body clothing?: A Lot 6 Click Score: 15    End of Session Equipment Utilized During Treatment: Rolling walker;Other (comment)(BSC)  OT Visit Diagnosis: Unsteadiness on feet (R26.81);Muscle weakness (generalized) (M62.81)   Activity Tolerance Patient tolerated treatment well   Patient Left in bed;with call bell/phone within reach;with  nursing/sitter in room;with family/visitor present   Nurse Communication      Functional Assessment Tool Used: AM-PAC 6 Clicks Daily Activity   Time: 3762-8315 OT Time Calculation (min): 39 min  Charges: OT G-codes **NOT FOR INPATIENT CLASS** Functional Assessment Tool Used: AM-PAC 6 Clicks Daily Activity OT General Charges $OT Visit: 1 Visit OT Treatments $Self Care/Home Management : 23-37 mins $Therapeutic Activity: 8-22 mins     Britt Bottom 05/09/2017, 2:52 PM

## 2017-05-09 NOTE — Progress Notes (Signed)
Patient is alert and oriented with discharged teaching given iv and tele off [place] notified that patient will be discharged with d/c package with EMS.

## 2017-05-09 NOTE — Progress Notes (Signed)
Progress Note  Patient Name: Kelly Ruiz Date of Encounter: 05/09/2017  Primary Cardiologist: Minus Breeding, MD   Subjective   Breathing is OK   No CP  Ready to go home  Does not want to go back to rehab  Inpatient Medications    Scheduled Meds: . acetaminophen  1,000 mg Oral TID  . brinzolamide  1 drop Right Eye BID  . budesonide (PULMICORT) nebulizer solution  0.5 mg Nebulization BID  . carvedilol  12.5 mg Oral BID WC  . dicyclomine  10 mg Oral TID AC  . doxazosin  2 mg Oral QHS  . feeding supplement (ENSURE ENLIVE)  237 mL Oral BID BM  . ferrous sulfate  325 mg Oral Daily  . fluticasone  2 spray Each Nare BID  . furosemide  40 mg Oral Daily  . guaiFENesin  600 mg Oral QHS  . heparin  5,000 Units Subcutaneous Q8H  . hydrALAZINE  100 mg Oral TID  . insulin aspart  0-9 Units Subcutaneous TID WC  . isosorbide mononitrate  240 mg Oral Daily  . latanoprost  1 drop Right Eye QHS  . montelukast  10 mg Oral QHS  . multivitamin   Oral BID  . oxymetazoline  2 spray Each Nare BID  . pantoprazole  40 mg Oral Daily  . sodium bicarbonate  650 mg Oral BID  . timolol  1 drop Right Eye BID  . traZODone  75 mg Oral QHS  . vitamin B-12  1,000 mcg Oral Daily  . vitamin C  500 mg Oral Daily   Continuous Infusions:  PRN Meds: ipratropium-albuterol, ipratropium-albuterol, ondansetron **OR** ondansetron (ZOFRAN) IV, oxyCODONE, polyethylene glycol, senna-docusate   Vital Signs    Vitals:   05/08/17 2247 05/09/17 0500 05/09/17 0748 05/09/17 0949  BP: (!) 137/41 (!) 147/66  (!) 152/53  Pulse: 60 (!) 57  66  Resp:  18  20  Temp:  98 F (36.7 C)  98.2 F (36.8 C)  TempSrc:  Oral  Oral  SpO2:  98% 96% 100%  Weight:  145 lb 3.2 oz (65.9 kg)    Height:        Intake/Output Summary (Last 24 hours) at 05/09/2017 1336 Last data filed at 05/09/2017 1012 Gross per 24 hour  Intake 840 ml  Output 1100 ml  Net -260 ml   Filed Weights   05/07/17 0509 05/08/17 0458 05/09/17 0500    Weight: 142 lb 3.2 oz (64.5 kg) 145 lb 3.2 oz (65.9 kg) 145 lb 3.2 oz (65.9 kg)    Telemetry    SR   - Personally Reviewed  ECG    No new tracings - Personally Reviewed  Physical Exam   GEN: No acute distress.   Neck: JVP normal   Cardiac: RRR, no murmurs, rubs, or gallops.  Respiratory: Clear to auscultation bilaterally. GI: Soft, nontender, non-distended  MS: trace edema; No deformity. Neuro:  Nonfocal  Psych: Normal affect   Labs    Chemistry Recent Labs  Lab 05/07/17 0533 05/08/17 0456 05/09/17 0351  NA 132* 133* 130*  K 4.7 4.8 4.7  CL 97* 97* 96*  CO2 26 26 25   GLUCOSE 149* 117* 111*  BUN 72* 74* 77*  CREATININE 2.00* 2.03* 2.02*  CALCIUM 9.0 9.1 8.7*  PROT  --   --  5.0*  ALBUMIN 2.7* 2.7* 2.7*  AST  --   --  13*  ALT  --   --  17  ALKPHOS  --   --  74  BILITOT  --   --  0.8  GFRNONAA 21* 20* 20*  GFRAA 24* 24* 24*  ANIONGAP 9 10 9      Hematology Recent Labs  Lab 05/04/17 0516 05/05/17 0814 05/09/17 0351  WBC 6.7 7.3 6.5  RBC 2.58* 2.71* 2.55*  HGB 8.5* 8.7* 8.2*  HCT 25.8* 27.1* 25.9*  MCV 100.0 100.0 101.6*  MCH 32.9 32.1 32.2  MCHC 32.9 32.1 31.7  RDW 13.0 13.0 13.2  PLT 121* 126* 124*    Cardiac Enzymes Recent Labs  Lab 05/02/17 2113 05/02/17 2323  TROPONINI <0.03 <0.03    No results for input(s): TROPIPOC in the last 168 hours.   BNP No results for input(s): BNP, PROBNP in the last 168 hours.   DDimer No results for input(s): DDIMER in the last 168 hours.   Radiology    No results found.  Cardiac Studies   Echo 03/31/17: Study Conclusions - Left ventricle: The cavity size was normal. Wall thickness was increased in a pattern of moderate LVH. Systolic function was normal. The estimated ejection fraction was in the range of 60% to 65%. Wall motion was normal; there were no regional wall motion abnormalities. Features are consistent with a pseudonormal left ventricular filling pattern, with concomitant  abnormal relaxation and increased filling pressure (grade 2 diastolic dysfunction). - Mitral valve: Severely calcified annulus. There was moderate regurgitation. - Left atrium: The atrium was moderately dilated. - Pulmonary arteries: Systolic pressure was moderately increased. PA peak pressure: 50 mm Hg (S).  Patient Profile     84 y.o. female with a hx of chronic diastolic dysfunction, stage IV CKD, HTN and T2DM, admitted for acute on chronic renal failure,who is being seen formanagement of chronic diastolic HF andevaluation fordyspnea.  Assessment & Plan    1. Acute on chronic diastolic heart failure Volume looks good  Keep on same regimen    2. Acute on chronic kidney disease stage IV Cr 2   Follow as outpt     OK to discharge from a cardiology standpoint.   For questions or updates, please contact [employer] Please consult [email or web address] for contact info under Cardiology/STEMI.      Signed, Dorris Carnes, MD  05/09/2017, 1:36 PM    Patient seen and examined   I agree with findings as noted above by A Duke  Above    Pt feeling good today  On exam:  JVP is minimally increased  Lungs are CTA  Cardiac RRR  No S3  Ext without edema  Volume status s OK  Pt comfortable    I would keep on current regmien  Note that palliative care has seen pt     OK to d/c from cardiac standpoint.  Dorris Carnes

## 2017-05-09 NOTE — Discharge Summary (Addendum)
Physician Discharge Summary  Ladeidra Borys MRN: [id number] DOB/AGE: 82-Dec-1925 82 y.o.  PCP: Ronita Hipps, MD   Admit date: 05/01/2017 Discharge date: 05/09/2017  Discharge Diagnoses:    Principal Problem:   Acute renal failure superimposed on stage 4 chronic kidney disease (HCC) Active Problems:   Chronic diastolic congestive heart failure (HCC)   Essential hypertension   DM (diabetes mellitus), type 2 with renal complications (HCC)   Dyspnea, unspecified   Hyperkalemia   Acute on chronic diastolic HF (heart failure) (HCC)   CKD (chronic kidney disease), stage IV (HCC)   AKI (acute kidney injury) (HCC)   Metabolic acidosis, NAG, failure of bicarbonate regeneration   Hyponatremia   Weakness   Goals of care, counseling/discussion   Palliative care encounter    Follow-up recommendations   Patient is being discharged with son and home hospice     Allergies as of 05/09/2017      Reactions   Actos [pioglitazone Hydrochloride]    Felodipine Er    Gabapentin Other (See Comments)   Went to sleep and could not wake up   Hctz [hydrochlorothiazide]    Lisinopril    Plendil [felodipine]    Amlodipine Besylate Other (See Comments), Hives   Brimonidine Tartrate Other (See Comments)   Eye redness and itchiness, blurred vision   Clonidine Derivatives Rash   Combigan [brimonidine Tartrate-timolol] Other (See Comments)   Eye redness and itchiness, blurred vision   Dorzolamide Other (See Comments)   Increased eye pressure, blurred vision   Monopril [fosinopril Sodium] Cough   Norvasc [amlodipine Besylate] Hives   Penicillins Rash   Has patient had a PCN reaction causing immediate rash, facial/tongue/throat swelling, SOB or lightheadedness with hypotension: unknown Has patient had a PCN reaction causing severe rash involving mucus membranes or skin necrosis: unknown Has patient had a PCN reaction that required hospitalization: unknown Has patient had a PCN reaction occurring  within the last 10 years: no If all of the above answers are "NO", then may proceed with Cephalosporin use.      Medication List    STOP taking these medications   butalbital-acetaminophen-caffeine 50-325-40 MG tablet Commonly known as:  FIORICET, ESGIC   nitroGLYCERIN 0.4 MG SL tablet Commonly known as:  NITROSTAT   omeprazole 20 MG capsule Commonly known as:  PRILOSEC Replaced by:  pantoprazole 40 MG tablet   sulfamethoxazole-trimethoprim 800-160 MG tablet Commonly known as:  BACTRIM DS,SEPTRA DS   traZODone 50 MG tablet Commonly known as:  DESYREL     TAKE these medications   acetaminophen 500 MG tablet Commonly known as:  TYLENOL Take 1,000 mg by mouth 2 (two) times daily.   B-12 COMPLIANCE INJECTION IJ Inject as directed. Inject 1cc intramuscularly in the evening on the 27th and ending on the 27th every month for prophylaxis   vitamin B-12 1000 MCG tablet Commonly known as:  CYANOCOBALAMIN Take 1,000 mcg by mouth daily.   brinzolamide 1 % ophthalmic suspension Commonly known as:  AZOPT Place 1 drop into the right eye 2 (two) times daily.   carvedilol 12.5 MG tablet Commonly known as:  COREG Take 1 tablet (12.5 mg total) by mouth 2 (two) times daily with a meal. What changed:    medication strength  how much to take   dicyclomine 10 MG capsule Commonly known as:  BENTYL Take 10 mg by mouth 3 (three) times daily before meals.   docusate sodium 100 MG capsule Commonly known as:  COLACE Take 1 capsule (100 mg  total) by mouth 2 (two) times daily as needed for mild constipation.   doxazosin 2 MG tablet Commonly known as:  CARDURA Take 1 tablet (2 mg total) by mouth at bedtime.   EYE VITAMINS PO Take 1 tablet by mouth 2 (two) times daily. Lunch and at bedtime prervision Ared 2   feeding supplement (ENSURE ENLIVE) Liqd Take 237 mLs by mouth 2 (two) times daily between meals. What changed:  additional instructions   ferrous sulfate 325 (65 FE) MG  tablet Take 325 mg by mouth daily.   fluticasone 50 MCG/ACT nasal spray Commonly known as:  FLONASE Place 2 sprays into both nostrils 2 (two) times daily.   furosemide 40 MG tablet Commonly known as:  LASIX Take 1 tablet (40 mg total) by mouth daily.   glipiZIDE 5 MG tablet Commonly known as:  GLUCOTROL Take 0.5 tablets (2.5 mg total) by mouth daily before breakfast. What changed:    medication strength  how much to take  when to take this   guaiFENesin 600 MG 12 hr tablet Commonly known as:  MUCINEX Take 1 tablet (600 mg total) by mouth 2 (two) times daily. What changed:  when to take this   hydrALAZINE 100 MG tablet Commonly known as:  APRESOLINE Take 1 tablet (100 mg total) by mouth 3 (three) times daily.   isosorbide mononitrate 120 MG 24 hr tablet Commonly known as:  IMDUR Take 2 tablets (240 mg total) by mouth daily.   latanoprost 0.005 % ophthalmic solution Commonly known as:  XALATAN Place 1 drop into the right eye at bedtime.   Melatonin 3 MG Tabs Take 3 mg by mouth at bedtime.   montelukast 10 MG tablet Commonly known as:  SINGULAIR Take 10 mg by mouth at bedtime.   oxymetazoline 0.05 % nasal spray Commonly known as:  AFRIN Place 2 sprays into both nostrils 2 (two) times daily. 20 minutes before the Flonase Nasal   pantoprazole 40 MG tablet Commonly known as:  PROTONIX Take 1 tablet (40 mg total) by mouth daily. Replaces:  omeprazole 20 MG capsule   polyethylene glycol packet Commonly known as:  MIRALAX / GLYCOLAX Take 17 g by mouth daily as needed for mild constipation.   senna-docusate 8.6-50 MG tablet Commonly known as:  Senokot-S Take 1 tablet by mouth at bedtime as needed for moderate constipation.   sodium bicarbonate 650 MG tablet Take 1 tablet (650 mg total) by mouth 2 (two) times daily.   timolol 0.5 % ophthalmic solution Commonly known as:  TIMOPTIC Place 1 drop into the right eye 2 (two) times daily.   vitamin C 500 MG  tablet Commonly known as:  ASCORBIC ACID Take 500 mg by mouth daily.         Discharge Condition:  Stable   Discharge Instructions Get Medicines reviewed and adjusted: Please take all your medications with you for your next visit with your Primary MD  Please request your Primary MD to go over all hospital tests and procedure/radiological results at the follow up, please ask your Primary MD to get all Hospital records sent to his/her office.  If you experience worsening of your admission symptoms, develop shortness of breath, life threatening emergency, suicidal or homicidal thoughts you must seek medical attention immediately by calling 911 or calling your MD immediately if symptoms less severe.  You must read complete instructions/literature along with all the possible adverse reactions/side effects for all the Medicines you take and that have been prescribed to you.  Take any new Medicines after you have completely understood and accpet all the possible adverse reactions/side effects.   Do not drive when taking Pain medications.   Do not take more than prescribed Pain, Sleep and Anxiety Medications  Special Instructions: If you have smoked or chewed Tobacco in the last 2 yrs please stop smoking, stop any regular Alcohol and or any Recreational drug use.  Wear Seat belts while driving.  Please note  You were cared for by a hospitalist during your hospital stay. Once you are discharged, your primary care physician will handle any further medical issues. Please note that NO REFILLS for any discharge medications will be authorized once you are discharged, as it is imperative that you return to your primary care physician (or establish a relationship with a primary care physician if you do not have one) for your aftercare needs so that they can reassess your need for medications and monitor your lab values.  Discharge Instructions    Discharge patient   Complete by:  As directed     With hospice after 2 PM   Discharge disposition:  01-Home or Self Care   Discharge patient date:  05/09/2017       Allergies  Allergen Reactions  . Actos [Pioglitazone Hydrochloride]   . Felodipine Er   . Gabapentin Other (See Comments)    Went to sleep and could not wake up  . Hctz [Hydrochlorothiazide]   . Lisinopril   . Plendil [Felodipine]   . Amlodipine Besylate Other (See Comments) and Hives  . Brimonidine Tartrate Other (See Comments)    Eye redness and itchiness, blurred vision  . Clonidine Derivatives Rash  . Combigan [Brimonidine Tartrate-Timolol] Other (See Comments)    Eye redness and itchiness, blurred vision  . Dorzolamide Other (See Comments)    Increased eye pressure, blurred vision  . Monopril [Fosinopril Sodium] Cough  . Norvasc [Amlodipine Besylate] Hives  . Penicillins Rash    Has patient had a PCN reaction causing immediate rash, facial/tongue/throat swelling, SOB or lightheadedness with hypotension: unknown Has patient had a PCN reaction causing severe rash involving mucus membranes or skin necrosis: unknown Has patient had a PCN reaction that required hospitalization: unknown Has patient had a PCN reaction occurring within the last 10 years: no If all of the above answers are "NO", then may proceed with Cephalosporin use.       Disposition: Discharge disposition: 01-Home or Self Care        Consults:  Palliative  cardiology Nephrology    Significant Diagnostic Studies:  Dg Chest 2 View  Result Date: 05/01/2017 CLINICAL DATA:  Worsening shortness of breath and weight gain over the last 4 days. EXAM: CHEST - 2 VIEW COMPARISON:  03/30/2017 FINDINGS: Chronic cardiomegaly and aortic atherosclerosis. Pulmonary venous hypertension. Small effusions with mild dependent atelectasis. No consolidation or lobar collapse. IMPRESSION: Cardiomegaly. Venous hypertension. Small effusions and mild dependent atelectasis. Electronically Signed   By: Nelson Chimes M.D.   On: 05/01/2017 16:41   Nm Pulmonary Perf And Vent  Result Date: 05/02/2017 CLINICAL DATA:  Shortness of breath EXAM: NUCLEAR MEDICINE VENTILATION - PERFUSION LUNG SCAN VIEWS: Anterior, posterior, left lateral, right lateral, RPO, LPO, RAO, LAO-ventilation and perfusion RADIOPHARMACEUTICALS:  31.2 mCi of Tc-45mDTPA aerosol inhalation and 4.15 mCi Tc917mAA IV COMPARISON:  Chest radiograph May 01, 2017 FINDINGS: Ventilation: Radiotracer uptake on the ventilation study shows patchy areas of decreased radiotracer uptake in the right mid lung region. No well-defined segmental ventilation defects. Perfusion:  Radiotracer uptake on the perfusion study is homogeneous and symmetric. There is no appreciable perfusion abnormality in the areas of relative photopenia in the right lung on the ventilation study. No appreciable perfusion defects evident. IMPRESSION: No appreciable perfusion defects. This study constitutes a very low probability of pulmonary embolus. Decreased radiotracer uptake in portions of the right mid lung on the ventilation study potentially may indicate a degree of underlying COPD involving portions of the right lung. Electronically Signed   By: Lowella Grip III M.D.   On: 05/02/2017 15:44   Dg Chest Port 1 View  Result Date: 05/07/2017 CLINICAL DATA:  Shortness of breath, wheezing. EXAM: PORTABLE CHEST 1 VIEW COMPARISON:  Radiographs of May 01, 2017. FINDINGS: Stable cardiomegaly with central pulmonary vascular congestion. Stable bibasilar subsegmental atelectasis is noted with minimal pleural effusions. No pneumothorax is noted. Atherosclerosis of thoracic aorta is noted. Bony thorax is unremarkable. IMPRESSION: Stable cardiomegaly with central pulmonary vascular congestion. Stable bibasilar subsegmental atelectasis with minimal pleural effusions. Aortic Atherosclerosis (ICD10-I70.0). Electronically Signed   By: Marijo Conception, M.D.   On: 05/07/2017 09:13   Echo   LV EF:  60% -   65%  ------------------------------------------------------------------- Indications:      Chest pain 786.51.  ------------------------------------------------------------------- History:   PMH:  Chronic Kidney Disease, Anemia.  Angina pectoris. Risk factors:  Hypertension. Diabetes mellitus.  ------------------------------------------------------------------- Study Conclusions  - Left ventricle: The cavity size was normal. Wall thickness was   increased in a pattern of moderate LVH. Systolic function was   normal. The estimated ejection fraction was in the range of 60%   to 65%. Wall motion was normal; there were no regional wall   motion abnormalities. Features are consistent with a pseudonormal   left ventricular filling pattern, with concomitant abnormal   relaxation and increased filling pressure (grade 2 diastolic   dysfunction). - Mitral valve: Severely calcified annulus. There was moderate   regurgitation. - Left atrium: The atrium was moderately dilated. - Pulmonary arteries: Systolic pressure was moderately increased.   PA peak pressure: 50 mm Hg (S).    Filed Weights   05/07/17 0509 05/08/17 0458 05/09/17 0500  Weight: 64.5 kg (142 lb 3.2 oz) 65.9 kg (145 lb 3.2 oz) 65.9 kg (145 lb 3.2 oz)     Microbiology: Recent Results (from the past 240 hour(s))  MRSA PCR Screening     Status: None   Collection Time: 05/02/17 10:04 PM  Result Value Ref Range Status   MRSA by PCR NEGATIVE NEGATIVE Final    Comment:        The GeneXpert MRSA Assay (FDA approved for NASAL specimens only), is one component of a comprehensive MRSA colonization surveillance program. It is not intended to diagnose MRSA infection nor to guide or monitor treatment for MRSA infections. Performed at [facility], [place] [address], [place], [place] [address]        Blood Culture    Component Value Date/Time   SDES URINE, CLEAN CATCH 03/26/2017 1429   SPECREQUEST   03/26/2017 1429    NONE Performed at [facility], [place] [address], [place], [place] [address]    CULT 50,000 COLONIES/mL ENTEROCOCCUS FAECALIS (A) 03/26/2017 1429   REPTSTATUS 03/28/2017 FINAL 03/26/2017 1429      Labs: Results for orders placed or performed during the hospital encounter of 05/01/17 (from the past 48 hour(s))  Glucose, capillary     Status: Abnormal   Collection Time: 05/07/17 11:56 AM  Result Value  Ref Range   Glucose-Capillary 151 (H) 65 - 99 mg/dL   Comment 1 Notify RN    Comment 2 Document in Chart   Glucose, capillary     Status: Abnormal   Collection Time: 05/07/17  4:58 PM  Result Value Ref Range   Glucose-Capillary 111 (H) 65 - 99 mg/dL   Comment 1 Notify RN    Comment 2 Document in Chart   Glucose, capillary     Status: Abnormal   Collection Time: 05/07/17  9:30 PM  Result Value Ref Range   Glucose-Capillary 148 (H) 65 - 99 mg/dL  Renal function panel     Status: Abnormal   Collection Time: 05/08/17  4:56 AM  Result Value Ref Range   Sodium 133 (L) 135 - 145 mmol/L   Potassium 4.8 3.5 - 5.1 mmol/L   Chloride 97 (L) 101 - 111 mmol/L   CO2 26 22 - 32 mmol/L   Glucose, Bld 117 (H) 65 - 99 mg/dL   BUN 74 (H) 6 - 20 mg/dL   Creatinine, Ser 2.03 (H) 0.44 - 1.00 mg/dL   Calcium 9.1 8.9 - 10.3 mg/dL   Phosphorus 4.0 2.5 - 4.6 mg/dL   Albumin 2.7 (L) 3.5 - 5.0 g/dL   GFR calc non Af Amer 20 (L) >60 mL/min   GFR calc Af Amer 24 (L) >60 mL/min    Comment: (NOTE) The eGFR has been calculated using the CKD EPI equation. This calculation has not been validated in all clinical situations. eGFR's persistently <60 mL/min signify possible Chronic Kidney Disease.    Anion gap 10 5 - 15    Comment: Performed at [facility] [address], [place], [place] [address]  Glucose, capillary     Status: Abnormal   Collection Time: 05/08/17  7:27 AM  Result Value Ref Range   Glucose-Capillary 122 (H) 65 - 99 mg/dL   Comment 1 Notify RN    Comment 2  Document in Chart   Glucose, capillary     Status: Abnormal   Collection Time: 05/08/17 12:00 PM  Result Value Ref Range   Glucose-Capillary 211 (H) 65 - 99 mg/dL   Comment 1 Notify RN    Comment 2 Document in Chart   Glucose, capillary     Status: Abnormal   Collection Time: 05/08/17  5:01 PM  Result Value Ref Range   Glucose-Capillary 157 (H) 65 - 99 mg/dL   Comment 1 Notify RN   Glucose, capillary     Status: Abnormal   Collection Time: 05/08/17  9:08 PM  Result Value Ref Range   Glucose-Capillary 104 (H) 65 - 99 mg/dL  Comprehensive metabolic panel     Status: Abnormal   Collection Time: 05/09/17  3:51 AM  Result Value Ref Range   Sodium 130 (L) 135 - 145 mmol/L   Potassium 4.7 3.5 - 5.1 mmol/L   Chloride 96 (L) 101 - 111 mmol/L   CO2 25 22 - 32 mmol/L   Glucose, Bld 111 (H) 65 - 99 mg/dL   BUN 77 (H) 6 - 20 mg/dL   Creatinine, Ser 2.02 (H) 0.44 - 1.00 mg/dL   Calcium 8.7 (L) 8.9 - 10.3 mg/dL   Total Protein 5.0 (L) 6.5 - 8.1 g/dL   Albumin 2.7 (L) 3.5 - 5.0 g/dL   AST 13 (L) 15 - 41 U/L   ALT 17 14 - 54 U/L   Alkaline Phosphatase 74 38 - 126 U/L   Total Bilirubin  0.8 0.3 - 1.2 mg/dL   GFR calc non Af Amer 20 (L) >60 mL/min   GFR calc Af Amer 24 (L) >60 mL/min    Comment: (NOTE) The eGFR has been calculated using the CKD EPI equation. This calculation has not been validated in all clinical situations. eGFR's persistently <60 mL/min signify possible Chronic Kidney Disease.    Anion gap 9 5 - 15    Comment: Performed at [facility] [address], [place], [place] [address]  CBC     Status: Abnormal   Collection Time: 05/09/17  3:51 AM  Result Value Ref Range   WBC 6.5 4.0 - 10.5 K/uL   RBC 2.55 (L) 3.87 - 5.11 MIL/uL   Hemoglobin 8.2 (L) 12.0 - 15.0 g/dL   HCT 25.9 (L) 36.0 - 46.0 %   MCV 101.6 (H) 78.0 - 100.0 fL   MCH 32.2 26.0 - 34.0 pg   MCHC 31.7 30.0 - 36.0 g/dL   RDW 13.2 11.5 - 15.5 %   Platelets 124 (L) 150 - 400 K/uL    Comment: Performed at  [facility], [place] [address], [place], [place] [address]  Glucose, capillary     Status: Abnormal   Collection Time: 05/09/17  7:38 AM  Result Value Ref Range   Glucose-Capillary 109 (H) 65 - 99 mg/dL   Comment 1 Notify RN      Lipid Panel  No results found for: CHOL, TRIG, HDL, CHOLHDL, VLDL, LDLCALC, LDLDIRECT   Lab Results  Component Value Date   HGBA1C 5.5 03/26/2017     Lab Results  Component Value Date   CREATININE 2.02 (H) 05/09/2017     HPI :  84 y.o.femalewith medical history significant ofdiastolic CHF, CKD stage 4, HTN, DM2. [facility] for AKI believed to be pre-renal and diuretic induced. She was initially hydrated until 3/16 with improving kidney function, then developed acute on chronic diastolic CHF due to fluid overload. She was diuresed from that point on until discharge. Creat at discharge was 1.48 down from 3.9 on admission.It does not appear that nephrology was consulted at that time. CT at that time showed large bowel stool with atrophic kidneys She was quite anemic during the hospitalization and it appeared that she was iron deficient. She was discharged off her ARB and was given a regimen of lasix based on weight.  She was discharged from the rehab facility last week and her son noted and increase in weight and dyspnea He gave her the extra lasix as prescribed but brought her to the hospital as she was getting weaker and had worsening dyspnea. Her labs in the ER showed K 5.8 Na 125 CO2 17 BUN 91 Cr 3.07 Ca 8.8 Glc 173 . Felt to have metabolic acidosis. CXR negative for pulmonary edema. BNP was abnormal, but much lower than previous admission. BNP 517 (1,844 03/2017).  Nephrology was consulted for acute kidney injury and cardiology was consulted to help with management of chronic diastolic heart failure and dyspnea   HOSPITAL COURSE:    1 . acute on chronic kidney disease stage IV  baseline creatinine 1.6-1.9 Multifactorial  Due to volume depletion and Bactrim use/diuretics. Nephrology consulted .    Per nephrology patient also likely with cardiorenal syndrome.  Patient initially did not look volume overloaded on examination.  BNP was elevated but not significantly as during last hospitalization.  Patient was given some boluses of normal saline in the ED. Patient  was gently hydrated for 48 hours and IV fluids have been saline locked.  .  Renal function seems to have plateaued and currently at 2.0 from 1.87 from 3.07 on admission. Patient now on Lasix 40 mg daily for heart failure. Acidosis and hyperkalemia has improved  Per Nephrology patient a very poor dialysis candidate due to debility and age.  Nephrology has signed off  2.  Dyspnea likely secondary to acute on chronic diastolic heart failure Combination of acute on chronic diastolic heart failure . Baseline weight is 142 pounds. 147-150 pounds on admission.   Chest x-ray negative for any pulmonary edema.  May be secondary to worsening renal function versus cardiac etiology versus pulmonary etiology.  Cardiac enzymes negative x3.   Recent 2D echo 03/31/2017 with a EF of 60-65%, no wall motion abnormalities, grade 2 diastolic dysfunction.  Moderate mitral valvular regurgitation.  Moderately dilated left atrium.  Systolic pressure moderately increased with a PA peak pressure of 50 mmHg.  VQ scan with low probability for PE.  Lower extremity Dopplers negative for DVT.  ABG was done which was unremarkable.  Patient seen by cardiology who do not feel patient shortness of breath is from valvular dysfunction.  Feels may be secondary to acute on chronic heart failure in the setting of acute on chronic kidney disease.  Patient started on Lasix 40 mg daily per nephrology which will be continued for comfort.  Some clinical improvement.  Patient currently 98% on room air  3.  Hypertension Blood pressure normal to elevated.  Continue current  regimen of Coreg, Cardura, hydralazine, Imdur, Lasix.  Will defer blood pressure control to cardiology and nephrology.   4.  Hyperkalemia Likely secondary to acute on chronic renal failure.  Patient given some Kayexalate as well as IV bicarb with resolution of hyperkalemia on admission.    Continue renal diet.    5.  Weakness Questionable etiology.  May be secondary to problem #1.  PT/OT.  Based on palliative care recommendations and discussion with the son, they have decided to go home with hospice  6.  Hyponatremia   Likely secondary to hypervolemic hyponatremia  . Sodium levels initially improved with gentle hydration however continue to improve with diuresis.  TSH is 2.437.   [name] Continue Lasix 40 mg daily per nephrology.      7.  Metabolic acidosis status post bicarbonate drip currently on bicarbonate tablets Likely secondary to problem #1.   Per nephrology.    8.  Diabetes mellitus type 2 Patient not on insulin at home however on glyburide.  Hemoglobin A1c was 5.5 on 03/26/2017.  CBGs ranging from 122 -151.  Resuming low-dose  glyburide prior to discharge  9.  Prognosis Patient with extremely delicate fluid balance.  Patient presenting with worsening shortness of breath and worsening renal function.  Patient still dyspneic with minimal exertion.  Patient on diuretics with ongoing diuresis . Palliative care following for goals of care, son agreed to take mother home with hospice       Discharge Exam:   Blood pressure (!) 147/66, pulse (!) 57, temperature 98 F (36.7 C), temperature source Oral, resp. rate 18, height 5' (1.524 m), weight 65.9 kg (145 lb 3.2 oz), SpO2 96 %.  movement.  No rhonchi.   Cardiovascular system: Regular rate and rhythm no murmurs rubs or gallops.  Positive JVD.  No lower extremity edema.  Gastrointestinal system: Abdomen is soft, nontender, nondistended, positive bowel sounds.  No rebound.  No guarding.  Central nervous system:  Alert and oriented.  No focal neurological deficits. Extremities: Symmetric 5 x 5 power. Skin: No rashes, lesions or ulcers Psychiatry: Judgement and insight appear normal. Mood & affect appropriate.         SignedReyne Dumas 05/09/2017, 8:12 AM        Time spent >1 hour

## 2017-05-09 NOTE — Progress Notes (Signed)
Pt transported home with EMS.

## 2017-05-09 NOTE — Progress Notes (Addendum)
Patient has decided to return home with hospice care ; CM talked to [facility] with Hospice, the equipment has been ordered and now they are awaiting for the delivery of the equipment, she will call the CM when the equipment is delivered. Mindi Slicker RN,MHA,BSN [phone]  12:22 pm- CM talked to [name]/ son; all of the equipment has been delivered; home address verified; [facility] non emergent ambulance called for transportation home; B Tum[place] RN,MHA,BSN

## 2017-05-09 NOTE — Progress Notes (Signed)
Patient has orders for discharge with hospice after 2pm.

## 2017-05-09 NOTE — Clinical Social Work Note (Signed)
Patient has orders to discharge home with hospice today.  CSW signing off.  Laterrance Nauta, CSW [phone]  

## 2017-05-09 NOTE — Progress Notes (Signed)
Patient with no complains or concerns during the night. Complained of SOB on exertion this morning while going to the [facility]. SOB improved with rest.   Will continue to monitor.  Lekisha Mcghee, RN

## 2017-05-14 DIAGNOSIS — Z6827 Body mass index (BMI) 27.0-27.9, adult: Secondary | ICD-10-CM | POA: Diagnosis not present

## 2017-05-14 DIAGNOSIS — I10 Essential (primary) hypertension: Secondary | ICD-10-CM | POA: Diagnosis not present

## 2017-05-14 DIAGNOSIS — E119 Type 2 diabetes mellitus without complications: Secondary | ICD-10-CM | POA: Diagnosis not present

## 2017-05-16 ENCOUNTER — Ambulatory Visit: Payer: Medicare Other | Admitting: Cardiology

## 2017-05-27 ENCOUNTER — Ambulatory Visit (INDEPENDENT_AMBULATORY_CARE_PROVIDER_SITE_OTHER): Payer: Medicare Other | Admitting: Podiatry

## 2017-05-27 DIAGNOSIS — B351 Tinea unguium: Secondary | ICD-10-CM | POA: Diagnosis not present

## 2017-05-27 DIAGNOSIS — E1151 Type 2 diabetes mellitus with diabetic peripheral angiopathy without gangrene: Secondary | ICD-10-CM | POA: Diagnosis not present

## 2017-05-27 NOTE — Patient Instructions (Signed)

## 2017-06-11 DIAGNOSIS — L304 Erythema intertrigo: Secondary | ICD-10-CM | POA: Diagnosis not present

## 2017-06-17 ENCOUNTER — Ambulatory Visit: Payer: Medicare Other | Admitting: Cardiology

## 2017-07-14 NOTE — Progress Notes (Signed)
Subjective:  Patient ID: Kelly Ruiz, female    DOB: 03-13-1923,  MRN: [id number]  Chief Complaint  Patient presents with  . Nail Problem    R great toenail medial border x Friday; tender to the touch -Pt denies swelling/draingar -redness is present Tx: epsom salt soaking  . debride    B/L nail trimming -sugar: "good" A1C: "don't know" PCP: [facility] LOV: x 1 wk    84 y.o. female presents with the above complaint.  Reports ingrown nails of the right great toe medial border tender to the touch denies swelling redness has been using Epson salts  Past Medical History:  Diagnosis Date  . Anemia   . Arthritis    "mostly in my knees, hands/fingers" (05/02/2017)  . Chest pain with moderate risk of acute coronary syndrome 11/08/2015  . CHF (congestive heart failure) (HCC)    Diastolic. Precipitated by Actos  . CKD (chronic kidney disease), stage IV (HCC)    mild-moderate. GFR was 35 in 02/12  . Coronary artery disease 02/2010   Last Lexiscan 11/2015 low risk.   [name] Dyslipidemia   . Dyspnea   . GERD (gastroesophageal reflux disease)   . History of blood transfusion    "related to anemia" (05/02/2017)  . Hypertension   . Nasal polyps   . Pneumonia    "I don't remember but 1 time" (05/02/2017)  . Type II diabetes mellitus ([place])   . Unspecified hypertensive heart disease without heart failure    Past Surgical History:  Procedure Laterality Date  . ABDOMINAL HYSTERECTOMY    . APPENDECTOMY  1951  . CATARACT EXTRACTION W/ INTRAOCULAR LENS  IMPLANT, BILATERAL Bilateral   . CHOLECYSTECTOMY OPEN    . GLAUCOMA SURGERY Left 10/29/2013  . NASAL POLYP SURGERY    . NECK MASS EXCISION Left    non cancerous  . TONSILLECTOMY      Current Outpatient Medications:  .  acetaminophen (TYLENOL) 500 MG tablet, Take 1,000 mg by mouth 2 (two) times daily. , Disp: , Rfl:  .  brinzolamide (AZOPT) 1 % ophthalmic suspension, Place 1 drop into the right eye 2 (two) times daily. , Disp: , Rfl:  .  carvedilol  (COREG) 12.5 MG tablet, Take 1 tablet (12.5 mg total) by mouth 2 (two) times daily with a meal., Disp: 60 tablet, Rfl: 0 .  Cyanocobalamin (B-12 COMPLIANCE INJECTION IJ), Inject as directed. Inject 1cc intramuscularly in the evening on the 27th and ending on the 27th every month for prophylaxis, Disp: , Rfl:  .  dicyclomine (BENTYL) 10 MG capsule, Take 10 mg by mouth 3 (three) times daily before meals. , Disp: , Rfl:  .  docusate sodium (COLACE) 100 MG capsule, Take 1 capsule (100 mg total) by mouth 2 (two) times daily as needed for mild constipation., Disp: 10 capsule, Rfl: 0 .  doxazosin (CARDURA) 2 MG tablet, Take 1 tablet (2 mg total) by mouth at bedtime., Disp: 30 tablet, Rfl: 0 .  feeding supplement, ENSURE ENLIVE, (ENSURE ENLIVE) LIQD, Take 237 mLs by mouth 2 (two) times daily between meals. (Patient taking differently: Take 237 mLs by mouth 2 (two) times daily between meals. Strawberry), Disp: 237 mL, Rfl: 12 .  ferrous sulfate 325 (65 FE) MG tablet, Take 325 mg by mouth daily. , Disp: , Rfl:  .  fluticasone (FLONASE) 50 MCG/ACT nasal spray, Place 2 sprays into both nostrils 2 (two) times daily., Disp: , Rfl:  .  furosemide (LASIX) 40 MG tablet, Take 1 tablet (40  mg total) by mouth daily., Disp: 30 tablet, Rfl: 5 .  glipiZIDE (GLUCOTROL) 5 MG tablet, Take 0.5 tablets (2.5 mg total) by mouth daily before breakfast., Disp: 30 tablet, Rfl: 0 .  guaiFENesin (MUCINEX) 600 MG 12 hr tablet, Take 1 tablet (600 mg total) by mouth 2 (two) times daily. (Patient taking differently: Take 600 mg by mouth at bedtime. ), Disp: 30 tablet, Rfl: 0 .  hydrALAZINE (APRESOLINE) 100 MG tablet, Take 1 tablet (100 mg total) by mouth 3 (three) times daily., Disp: 90 tablet, Rfl: 0 .  isosorbide mononitrate (IMDUR) 120 MG 24 hr tablet, Take 2 tablets (240 mg total) by mouth daily., Disp: 180 tablet, Rfl: 3 .  latanoprost (XALATAN) 0.005 % ophthalmic solution, Place 1 drop into the right eye at bedtime. , Disp: , Rfl:  .   Melatonin 3 MG TABS, Take 3 mg by mouth at bedtime., Disp: , Rfl:  .  montelukast (SINGULAIR) 10 MG tablet, Take 10 mg by mouth at bedtime., Disp: , Rfl:  .  Multiple Vitamins-Minerals (EYE VITAMINS PO), Take 1 tablet by mouth 2 (two) times daily. Lunch and at bedtime prervision Ared 2, Disp: , Rfl:  .  oxyCODONE (OXY IR/ROXICODONE) 5 MG immediate release tablet, Take 1 tablet (5 mg total) by mouth every 6 (six) hours as needed for moderate pain (SOB)., Disp: 30 tablet, Rfl: 0 .  oxymetazoline (AFRIN) 0.05 % nasal spray, Place 2 sprays into both nostrils 2 (two) times daily. 20 minutes before the Flonase Nasal, Disp: , Rfl:  .  pantoprazole (PROTONIX) 40 MG tablet, Take 1 tablet (40 mg total) by mouth daily., Disp: 30 tablet, Rfl: 1 .  polyethylene glycol (MIRALAX / GLYCOLAX) packet, Take 17 g by mouth daily as needed for mild constipation., Disp: 14 each, Rfl: 0 .  senna-docusate (SENOKOT-S) 8.6-50 MG tablet, Take 1 tablet by mouth at bedtime as needed for moderate constipation., Disp: 60 tablet, Rfl: 1 .  sodium bicarbonate 650 MG tablet, Take 1 tablet (650 mg total) by mouth 2 (two) times daily., Disp: 60 tablet, Rfl: 2 .  timolol (TIMOPTIC) 0.5 % ophthalmic solution, Place 1 drop into the right eye 2 (two) times daily. , Disp: , Rfl:  .  vitamin B-12 (CYANOCOBALAMIN) 1000 MCG tablet, Take 1,000 mcg by mouth daily., Disp: , Rfl:  .  vitamin C (ASCORBIC ACID) 500 MG tablet, Take 500 mg by mouth daily. , Disp: , Rfl:   Allergies  Allergen Reactions  . Actos [Pioglitazone Hydrochloride]   . Felodipine Er   . Gabapentin Other (See Comments)    Went to sleep and could not wake up  . Hctz [Hydrochlorothiazide]   . Lisinopril   . Plendil [Felodipine]   . Amlodipine Besylate Other (See Comments) and Hives  . Brimonidine Tartrate Other (See Comments)    Eye redness and itchiness, blurred vision  . Clonidine Derivatives Rash  . Combigan [Brimonidine Tartrate-Timolol] Other (See Comments)    Eye  redness and itchiness, blurred vision  . Dorzolamide Other (See Comments)    Increased eye pressure, blurred vision  . Monopril [Fosinopril Sodium] Cough  . Norvasc [Amlodipine Besylate] Hives  . Penicillins Rash    Has patient had a PCN reaction causing immediate rash, facial/tongue/throat swelling, SOB or lightheadedness with hypotension: unknown Has patient had a PCN reaction causing severe rash involving mucus membranes or skin necrosis: unknown Has patient had a PCN reaction that required hospitalization: unknown Has patient had a PCN reaction occurring within the last  10 years: no If all of the above answers are "NO", then may proceed with Cephalosporin use.    Review of Systems: Negative except as noted in the HPI. Denies N/V/F/Ch. Objective:   General AA&O x3. Normal mood and affect.  Vascular Dorsalis pedis pulses present 1+ bilaterally  Posterior tibial pulses absent bilaterally  Capillary refill normal to all digits. Pedal hair growth normal.  Neurologic Epicritic sensation present bilaterally. Protective sensation with 5.07 monofilament  present bilaterally. Vibratory sensation present bilaterally.  Dermatologic No open lesions. Interspaces clear of maceration.  Normal skin temperature and turgor. Hyperkeratotic lesions: None bilaterally. Nails: brittle, onychomycosis, thickening, elongation, ingrowing pain to palp  Orthopedic: No history of amputation. MMT 5/5 in dorsiflexion, plantarflexion, inversion, and eversion. Normal lower extremity joint ROM without pain or crepitus.     Assessment & Plan:  Patient was evaluated and treated and all questions answered.  Diabetes with PAD, Onychomycosis -Educated on diabetic footcare. Diabetic risk level 1 -Nails x10 debrided sharply and manually with large nail nipper and rotary burr.   Return in about 2 months (around 07/29/2017) for Diabetic Foot Care.

## 2017-07-30 ENCOUNTER — Ambulatory Visit: Payer: PRIVATE HEALTH INSURANCE | Admitting: Podiatry

## 2017-08-15 DIAGNOSIS — Z8719 Personal history of other diseases of the digestive system: Secondary | ICD-10-CM | POA: Diagnosis not present

## 2017-08-15 DIAGNOSIS — I2510 Atherosclerotic heart disease of native coronary artery without angina pectoris: Secondary | ICD-10-CM | POA: Diagnosis not present

## 2017-08-15 DIAGNOSIS — I5030 Unspecified diastolic (congestive) heart failure: Secondary | ICD-10-CM | POA: Diagnosis not present

## 2017-08-15 DIAGNOSIS — Z8701 Personal history of pneumonia (recurrent): Secondary | ICD-10-CM | POA: Diagnosis not present

## 2017-08-15 DIAGNOSIS — Z8679 Personal history of other diseases of the circulatory system: Secondary | ICD-10-CM | POA: Diagnosis not present

## 2017-08-15 DIAGNOSIS — Z862 Personal history of diseases of the blood and blood-forming organs and certain disorders involving the immune mechanism: Secondary | ICD-10-CM | POA: Diagnosis not present

## 2017-08-15 DIAGNOSIS — N184 Chronic kidney disease, stage 4 (severe): Secondary | ICD-10-CM | POA: Diagnosis not present

## 2017-08-15 DIAGNOSIS — Z8739 Personal history of other diseases of the musculoskeletal system and connective tissue: Secondary | ICD-10-CM | POA: Diagnosis not present

## 2017-08-15 DIAGNOSIS — Z8639 Personal history of other endocrine, nutritional and metabolic disease: Secondary | ICD-10-CM | POA: Diagnosis not present

## 2017-08-17 DIAGNOSIS — Z8679 Personal history of other diseases of the circulatory system: Secondary | ICD-10-CM | POA: Diagnosis not present

## 2017-08-17 DIAGNOSIS — Z8719 Personal history of other diseases of the digestive system: Secondary | ICD-10-CM | POA: Diagnosis not present

## 2017-08-17 DIAGNOSIS — I2510 Atherosclerotic heart disease of native coronary artery without angina pectoris: Secondary | ICD-10-CM | POA: Diagnosis not present

## 2017-08-17 DIAGNOSIS — N184 Chronic kidney disease, stage 4 (severe): Secondary | ICD-10-CM | POA: Diagnosis not present

## 2017-08-17 DIAGNOSIS — Z8639 Personal history of other endocrine, nutritional and metabolic disease: Secondary | ICD-10-CM | POA: Diagnosis not present

## 2017-08-17 DIAGNOSIS — I5030 Unspecified diastolic (congestive) heart failure: Secondary | ICD-10-CM | POA: Diagnosis not present

## 2017-08-19 DIAGNOSIS — Z8719 Personal history of other diseases of the digestive system: Secondary | ICD-10-CM | POA: Diagnosis not present

## 2017-08-19 DIAGNOSIS — N184 Chronic kidney disease, stage 4 (severe): Secondary | ICD-10-CM | POA: Diagnosis not present

## 2017-08-19 DIAGNOSIS — I5030 Unspecified diastolic (congestive) heart failure: Secondary | ICD-10-CM | POA: Diagnosis not present

## 2017-08-19 DIAGNOSIS — Z8639 Personal history of other endocrine, nutritional and metabolic disease: Secondary | ICD-10-CM | POA: Diagnosis not present

## 2017-08-19 DIAGNOSIS — I2510 Atherosclerotic heart disease of native coronary artery without angina pectoris: Secondary | ICD-10-CM | POA: Diagnosis not present

## 2017-08-19 DIAGNOSIS — Z8679 Personal history of other diseases of the circulatory system: Secondary | ICD-10-CM | POA: Diagnosis not present

## 2017-08-22 DIAGNOSIS — Z8679 Personal history of other diseases of the circulatory system: Secondary | ICD-10-CM | POA: Diagnosis not present

## 2017-08-22 DIAGNOSIS — Z8639 Personal history of other endocrine, nutritional and metabolic disease: Secondary | ICD-10-CM | POA: Diagnosis not present

## 2017-08-22 DIAGNOSIS — I2510 Atherosclerotic heart disease of native coronary artery without angina pectoris: Secondary | ICD-10-CM | POA: Diagnosis not present

## 2017-08-22 DIAGNOSIS — Z8719 Personal history of other diseases of the digestive system: Secondary | ICD-10-CM | POA: Diagnosis not present

## 2017-08-22 DIAGNOSIS — I5030 Unspecified diastolic (congestive) heart failure: Secondary | ICD-10-CM | POA: Diagnosis not present

## 2017-08-22 DIAGNOSIS — N184 Chronic kidney disease, stage 4 (severe): Secondary | ICD-10-CM | POA: Diagnosis not present

## 2017-08-26 DIAGNOSIS — Z8679 Personal history of other diseases of the circulatory system: Secondary | ICD-10-CM | POA: Diagnosis not present

## 2017-08-26 DIAGNOSIS — I5030 Unspecified diastolic (congestive) heart failure: Secondary | ICD-10-CM | POA: Diagnosis not present

## 2017-08-26 DIAGNOSIS — Z8639 Personal history of other endocrine, nutritional and metabolic disease: Secondary | ICD-10-CM | POA: Diagnosis not present

## 2017-08-26 DIAGNOSIS — I2510 Atherosclerotic heart disease of native coronary artery without angina pectoris: Secondary | ICD-10-CM | POA: Diagnosis not present

## 2017-08-26 DIAGNOSIS — N184 Chronic kidney disease, stage 4 (severe): Secondary | ICD-10-CM | POA: Diagnosis not present

## 2017-08-26 DIAGNOSIS — Z8719 Personal history of other diseases of the digestive system: Secondary | ICD-10-CM | POA: Diagnosis not present

## 2017-08-29 DIAGNOSIS — Z8679 Personal history of other diseases of the circulatory system: Secondary | ICD-10-CM | POA: Diagnosis not present

## 2017-08-29 DIAGNOSIS — N184 Chronic kidney disease, stage 4 (severe): Secondary | ICD-10-CM | POA: Diagnosis not present

## 2017-08-29 DIAGNOSIS — Z8719 Personal history of other diseases of the digestive system: Secondary | ICD-10-CM | POA: Diagnosis not present

## 2017-08-29 DIAGNOSIS — Z8639 Personal history of other endocrine, nutritional and metabolic disease: Secondary | ICD-10-CM | POA: Diagnosis not present

## 2017-08-29 DIAGNOSIS — I5030 Unspecified diastolic (congestive) heart failure: Secondary | ICD-10-CM | POA: Diagnosis not present

## 2017-08-29 DIAGNOSIS — I2510 Atherosclerotic heart disease of native coronary artery without angina pectoris: Secondary | ICD-10-CM | POA: Diagnosis not present

## 2017-09-02 DIAGNOSIS — Z8639 Personal history of other endocrine, nutritional and metabolic disease: Secondary | ICD-10-CM | POA: Diagnosis not present

## 2017-09-02 DIAGNOSIS — Z8679 Personal history of other diseases of the circulatory system: Secondary | ICD-10-CM | POA: Diagnosis not present

## 2017-09-02 DIAGNOSIS — I5030 Unspecified diastolic (congestive) heart failure: Secondary | ICD-10-CM | POA: Diagnosis not present

## 2017-09-02 DIAGNOSIS — N184 Chronic kidney disease, stage 4 (severe): Secondary | ICD-10-CM | POA: Diagnosis not present

## 2017-09-02 DIAGNOSIS — Z8719 Personal history of other diseases of the digestive system: Secondary | ICD-10-CM | POA: Diagnosis not present

## 2017-09-02 DIAGNOSIS — I2510 Atherosclerotic heart disease of native coronary artery without angina pectoris: Secondary | ICD-10-CM | POA: Diagnosis not present

## 2017-09-05 DIAGNOSIS — Z8719 Personal history of other diseases of the digestive system: Secondary | ICD-10-CM | POA: Diagnosis not present

## 2017-09-05 DIAGNOSIS — I2510 Atherosclerotic heart disease of native coronary artery without angina pectoris: Secondary | ICD-10-CM | POA: Diagnosis not present

## 2017-09-05 DIAGNOSIS — N184 Chronic kidney disease, stage 4 (severe): Secondary | ICD-10-CM | POA: Diagnosis not present

## 2017-09-05 DIAGNOSIS — I5030 Unspecified diastolic (congestive) heart failure: Secondary | ICD-10-CM | POA: Diagnosis not present

## 2017-09-05 DIAGNOSIS — Z8679 Personal history of other diseases of the circulatory system: Secondary | ICD-10-CM | POA: Diagnosis not present

## 2017-09-05 DIAGNOSIS — Z8639 Personal history of other endocrine, nutritional and metabolic disease: Secondary | ICD-10-CM | POA: Diagnosis not present

## 2017-09-06 DIAGNOSIS — I5030 Unspecified diastolic (congestive) heart failure: Secondary | ICD-10-CM | POA: Diagnosis not present

## 2017-09-06 DIAGNOSIS — I2510 Atherosclerotic heart disease of native coronary artery without angina pectoris: Secondary | ICD-10-CM | POA: Diagnosis not present

## 2017-09-06 DIAGNOSIS — N184 Chronic kidney disease, stage 4 (severe): Secondary | ICD-10-CM | POA: Diagnosis not present

## 2017-09-06 DIAGNOSIS — Z8719 Personal history of other diseases of the digestive system: Secondary | ICD-10-CM | POA: Diagnosis not present

## 2017-09-06 DIAGNOSIS — Z8639 Personal history of other endocrine, nutritional and metabolic disease: Secondary | ICD-10-CM | POA: Diagnosis not present

## 2017-09-06 DIAGNOSIS — Z8679 Personal history of other diseases of the circulatory system: Secondary | ICD-10-CM | POA: Diagnosis not present

## 2017-09-09 DIAGNOSIS — I5030 Unspecified diastolic (congestive) heart failure: Secondary | ICD-10-CM | POA: Diagnosis not present

## 2017-09-09 DIAGNOSIS — N184 Chronic kidney disease, stage 4 (severe): Secondary | ICD-10-CM | POA: Diagnosis not present

## 2017-09-09 DIAGNOSIS — I2510 Atherosclerotic heart disease of native coronary artery without angina pectoris: Secondary | ICD-10-CM | POA: Diagnosis not present

## 2017-09-09 DIAGNOSIS — Z8679 Personal history of other diseases of the circulatory system: Secondary | ICD-10-CM | POA: Diagnosis not present

## 2017-09-09 DIAGNOSIS — Z8719 Personal history of other diseases of the digestive system: Secondary | ICD-10-CM | POA: Diagnosis not present

## 2017-09-09 DIAGNOSIS — Z8639 Personal history of other endocrine, nutritional and metabolic disease: Secondary | ICD-10-CM | POA: Diagnosis not present

## 2017-09-12 DIAGNOSIS — I2510 Atherosclerotic heart disease of native coronary artery without angina pectoris: Secondary | ICD-10-CM | POA: Diagnosis not present

## 2017-09-12 DIAGNOSIS — Z8679 Personal history of other diseases of the circulatory system: Secondary | ICD-10-CM | POA: Diagnosis not present

## 2017-09-12 DIAGNOSIS — N184 Chronic kidney disease, stage 4 (severe): Secondary | ICD-10-CM | POA: Diagnosis not present

## 2017-09-12 DIAGNOSIS — Z8639 Personal history of other endocrine, nutritional and metabolic disease: Secondary | ICD-10-CM | POA: Diagnosis not present

## 2017-09-12 DIAGNOSIS — Z8719 Personal history of other diseases of the digestive system: Secondary | ICD-10-CM | POA: Diagnosis not present

## 2017-09-12 DIAGNOSIS — I5030 Unspecified diastolic (congestive) heart failure: Secondary | ICD-10-CM | POA: Diagnosis not present

## 2017-09-15 DIAGNOSIS — Z862 Personal history of diseases of the blood and blood-forming organs and certain disorders involving the immune mechanism: Secondary | ICD-10-CM | POA: Diagnosis not present

## 2017-09-15 DIAGNOSIS — Z8739 Personal history of other diseases of the musculoskeletal system and connective tissue: Secondary | ICD-10-CM | POA: Diagnosis not present

## 2017-09-15 DIAGNOSIS — Z8679 Personal history of other diseases of the circulatory system: Secondary | ICD-10-CM | POA: Diagnosis not present

## 2017-09-15 DIAGNOSIS — Z8719 Personal history of other diseases of the digestive system: Secondary | ICD-10-CM | POA: Diagnosis not present

## 2017-09-15 DIAGNOSIS — Z8701 Personal history of pneumonia (recurrent): Secondary | ICD-10-CM | POA: Diagnosis not present

## 2017-09-15 DIAGNOSIS — Z8639 Personal history of other endocrine, nutritional and metabolic disease: Secondary | ICD-10-CM | POA: Diagnosis not present

## 2017-09-15 DIAGNOSIS — I5030 Unspecified diastolic (congestive) heart failure: Secondary | ICD-10-CM | POA: Diagnosis not present

## 2017-09-15 DIAGNOSIS — N184 Chronic kidney disease, stage 4 (severe): Secondary | ICD-10-CM | POA: Diagnosis not present

## 2017-09-15 DIAGNOSIS — I2510 Atherosclerotic heart disease of native coronary artery without angina pectoris: Secondary | ICD-10-CM | POA: Diagnosis not present

## 2017-09-17 DIAGNOSIS — I5030 Unspecified diastolic (congestive) heart failure: Secondary | ICD-10-CM | POA: Diagnosis not present

## 2017-09-17 DIAGNOSIS — Z8679 Personal history of other diseases of the circulatory system: Secondary | ICD-10-CM | POA: Diagnosis not present

## 2017-09-17 DIAGNOSIS — N184 Chronic kidney disease, stage 4 (severe): Secondary | ICD-10-CM | POA: Diagnosis not present

## 2017-09-17 DIAGNOSIS — Z8719 Personal history of other diseases of the digestive system: Secondary | ICD-10-CM | POA: Diagnosis not present

## 2017-09-17 DIAGNOSIS — I2510 Atherosclerotic heart disease of native coronary artery without angina pectoris: Secondary | ICD-10-CM | POA: Diagnosis not present

## 2017-09-17 DIAGNOSIS — Z8639 Personal history of other endocrine, nutritional and metabolic disease: Secondary | ICD-10-CM | POA: Diagnosis not present

## 2017-09-18 DIAGNOSIS — I5030 Unspecified diastolic (congestive) heart failure: Secondary | ICD-10-CM | POA: Diagnosis not present

## 2017-09-18 DIAGNOSIS — Z8679 Personal history of other diseases of the circulatory system: Secondary | ICD-10-CM | POA: Diagnosis not present

## 2017-09-18 DIAGNOSIS — Z8639 Personal history of other endocrine, nutritional and metabolic disease: Secondary | ICD-10-CM | POA: Diagnosis not present

## 2017-09-18 DIAGNOSIS — I2510 Atherosclerotic heart disease of native coronary artery without angina pectoris: Secondary | ICD-10-CM | POA: Diagnosis not present

## 2017-09-18 DIAGNOSIS — N184 Chronic kidney disease, stage 4 (severe): Secondary | ICD-10-CM | POA: Diagnosis not present

## 2017-09-18 DIAGNOSIS — Z8719 Personal history of other diseases of the digestive system: Secondary | ICD-10-CM | POA: Diagnosis not present

## 2017-09-19 DIAGNOSIS — Z8639 Personal history of other endocrine, nutritional and metabolic disease: Secondary | ICD-10-CM | POA: Diagnosis not present

## 2017-09-19 DIAGNOSIS — Z8679 Personal history of other diseases of the circulatory system: Secondary | ICD-10-CM | POA: Diagnosis not present

## 2017-09-19 DIAGNOSIS — N184 Chronic kidney disease, stage 4 (severe): Secondary | ICD-10-CM | POA: Diagnosis not present

## 2017-09-19 DIAGNOSIS — Z8719 Personal history of other diseases of the digestive system: Secondary | ICD-10-CM | POA: Diagnosis not present

## 2017-09-19 DIAGNOSIS — I2510 Atherosclerotic heart disease of native coronary artery without angina pectoris: Secondary | ICD-10-CM | POA: Diagnosis not present

## 2017-09-19 DIAGNOSIS — I5030 Unspecified diastolic (congestive) heart failure: Secondary | ICD-10-CM | POA: Diagnosis not present

## 2017-09-23 DIAGNOSIS — I5030 Unspecified diastolic (congestive) heart failure: Secondary | ICD-10-CM | POA: Diagnosis not present

## 2017-09-23 DIAGNOSIS — Z8639 Personal history of other endocrine, nutritional and metabolic disease: Secondary | ICD-10-CM | POA: Diagnosis not present

## 2017-09-23 DIAGNOSIS — N184 Chronic kidney disease, stage 4 (severe): Secondary | ICD-10-CM | POA: Diagnosis not present

## 2017-09-23 DIAGNOSIS — I2510 Atherosclerotic heart disease of native coronary artery without angina pectoris: Secondary | ICD-10-CM | POA: Diagnosis not present

## 2017-09-23 DIAGNOSIS — Z8679 Personal history of other diseases of the circulatory system: Secondary | ICD-10-CM | POA: Diagnosis not present

## 2017-09-23 DIAGNOSIS — Z8719 Personal history of other diseases of the digestive system: Secondary | ICD-10-CM | POA: Diagnosis not present

## 2017-09-25 DIAGNOSIS — I5030 Unspecified diastolic (congestive) heart failure: Secondary | ICD-10-CM | POA: Diagnosis not present

## 2017-09-25 DIAGNOSIS — N184 Chronic kidney disease, stage 4 (severe): Secondary | ICD-10-CM | POA: Diagnosis not present

## 2017-09-25 DIAGNOSIS — Z8639 Personal history of other endocrine, nutritional and metabolic disease: Secondary | ICD-10-CM | POA: Diagnosis not present

## 2017-09-25 DIAGNOSIS — Z8679 Personal history of other diseases of the circulatory system: Secondary | ICD-10-CM | POA: Diagnosis not present

## 2017-09-25 DIAGNOSIS — I2510 Atherosclerotic heart disease of native coronary artery without angina pectoris: Secondary | ICD-10-CM | POA: Diagnosis not present

## 2017-09-25 DIAGNOSIS — Z8719 Personal history of other diseases of the digestive system: Secondary | ICD-10-CM | POA: Diagnosis not present

## 2017-09-26 DIAGNOSIS — Z8719 Personal history of other diseases of the digestive system: Secondary | ICD-10-CM | POA: Diagnosis not present

## 2017-09-26 DIAGNOSIS — Z8679 Personal history of other diseases of the circulatory system: Secondary | ICD-10-CM | POA: Diagnosis not present

## 2017-09-26 DIAGNOSIS — Z8639 Personal history of other endocrine, nutritional and metabolic disease: Secondary | ICD-10-CM | POA: Diagnosis not present

## 2017-09-26 DIAGNOSIS — I2510 Atherosclerotic heart disease of native coronary artery without angina pectoris: Secondary | ICD-10-CM | POA: Diagnosis not present

## 2017-09-26 DIAGNOSIS — I5030 Unspecified diastolic (congestive) heart failure: Secondary | ICD-10-CM | POA: Diagnosis not present

## 2017-09-26 DIAGNOSIS — N184 Chronic kidney disease, stage 4 (severe): Secondary | ICD-10-CM | POA: Diagnosis not present

## 2017-09-30 DIAGNOSIS — Z8719 Personal history of other diseases of the digestive system: Secondary | ICD-10-CM | POA: Diagnosis not present

## 2017-09-30 DIAGNOSIS — Z8639 Personal history of other endocrine, nutritional and metabolic disease: Secondary | ICD-10-CM | POA: Diagnosis not present

## 2017-09-30 DIAGNOSIS — I2510 Atherosclerotic heart disease of native coronary artery without angina pectoris: Secondary | ICD-10-CM | POA: Diagnosis not present

## 2017-09-30 DIAGNOSIS — I5030 Unspecified diastolic (congestive) heart failure: Secondary | ICD-10-CM | POA: Diagnosis not present

## 2017-09-30 DIAGNOSIS — N184 Chronic kidney disease, stage 4 (severe): Secondary | ICD-10-CM | POA: Diagnosis not present

## 2017-09-30 DIAGNOSIS — Z8679 Personal history of other diseases of the circulatory system: Secondary | ICD-10-CM | POA: Diagnosis not present

## 2017-10-03 DIAGNOSIS — N184 Chronic kidney disease, stage 4 (severe): Secondary | ICD-10-CM | POA: Diagnosis not present

## 2017-10-03 DIAGNOSIS — Z8639 Personal history of other endocrine, nutritional and metabolic disease: Secondary | ICD-10-CM | POA: Diagnosis not present

## 2017-10-03 DIAGNOSIS — Z8679 Personal history of other diseases of the circulatory system: Secondary | ICD-10-CM | POA: Diagnosis not present

## 2017-10-03 DIAGNOSIS — Z8719 Personal history of other diseases of the digestive system: Secondary | ICD-10-CM | POA: Diagnosis not present

## 2017-10-03 DIAGNOSIS — I5030 Unspecified diastolic (congestive) heart failure: Secondary | ICD-10-CM | POA: Diagnosis not present

## 2017-10-03 DIAGNOSIS — I2510 Atherosclerotic heart disease of native coronary artery without angina pectoris: Secondary | ICD-10-CM | POA: Diagnosis not present

## 2017-10-07 DIAGNOSIS — Z961 Presence of intraocular lens: Secondary | ICD-10-CM | POA: Diagnosis not present

## 2017-10-07 DIAGNOSIS — H04123 Dry eye syndrome of bilateral lacrimal glands: Secondary | ICD-10-CM | POA: Diagnosis not present

## 2017-10-07 DIAGNOSIS — N184 Chronic kidney disease, stage 4 (severe): Secondary | ICD-10-CM | POA: Diagnosis not present

## 2017-10-07 DIAGNOSIS — I5030 Unspecified diastolic (congestive) heart failure: Secondary | ICD-10-CM | POA: Diagnosis not present

## 2017-10-07 DIAGNOSIS — H348322 Tributary (branch) retinal vein occlusion, left eye, stable: Secondary | ICD-10-CM | POA: Diagnosis not present

## 2017-10-07 DIAGNOSIS — Z8679 Personal history of other diseases of the circulatory system: Secondary | ICD-10-CM | POA: Diagnosis not present

## 2017-10-07 DIAGNOSIS — E119 Type 2 diabetes mellitus without complications: Secondary | ICD-10-CM | POA: Diagnosis not present

## 2017-10-07 DIAGNOSIS — H401133 Primary open-angle glaucoma, bilateral, severe stage: Secondary | ICD-10-CM | POA: Diagnosis not present

## 2017-10-07 DIAGNOSIS — H353132 Nonexudative age-related macular degeneration, bilateral, intermediate dry stage: Secondary | ICD-10-CM | POA: Diagnosis not present

## 2017-10-07 DIAGNOSIS — I2510 Atherosclerotic heart disease of native coronary artery without angina pectoris: Secondary | ICD-10-CM | POA: Diagnosis not present

## 2017-10-07 DIAGNOSIS — Z8639 Personal history of other endocrine, nutritional and metabolic disease: Secondary | ICD-10-CM | POA: Diagnosis not present

## 2017-10-07 DIAGNOSIS — Z8719 Personal history of other diseases of the digestive system: Secondary | ICD-10-CM | POA: Diagnosis not present

## 2017-10-09 DIAGNOSIS — I2510 Atherosclerotic heart disease of native coronary artery without angina pectoris: Secondary | ICD-10-CM | POA: Diagnosis not present

## 2017-10-09 DIAGNOSIS — Z8639 Personal history of other endocrine, nutritional and metabolic disease: Secondary | ICD-10-CM | POA: Diagnosis not present

## 2017-10-09 DIAGNOSIS — N184 Chronic kidney disease, stage 4 (severe): Secondary | ICD-10-CM | POA: Diagnosis not present

## 2017-10-09 DIAGNOSIS — I5030 Unspecified diastolic (congestive) heart failure: Secondary | ICD-10-CM | POA: Diagnosis not present

## 2017-10-09 DIAGNOSIS — Z8679 Personal history of other diseases of the circulatory system: Secondary | ICD-10-CM | POA: Diagnosis not present

## 2017-10-09 DIAGNOSIS — Z8719 Personal history of other diseases of the digestive system: Secondary | ICD-10-CM | POA: Diagnosis not present

## 2017-10-10 DIAGNOSIS — I2510 Atherosclerotic heart disease of native coronary artery without angina pectoris: Secondary | ICD-10-CM | POA: Diagnosis not present

## 2017-10-10 DIAGNOSIS — Z8719 Personal history of other diseases of the digestive system: Secondary | ICD-10-CM | POA: Diagnosis not present

## 2017-10-10 DIAGNOSIS — I5030 Unspecified diastolic (congestive) heart failure: Secondary | ICD-10-CM | POA: Diagnosis not present

## 2017-10-10 DIAGNOSIS — Z8679 Personal history of other diseases of the circulatory system: Secondary | ICD-10-CM | POA: Diagnosis not present

## 2017-10-10 DIAGNOSIS — Z8639 Personal history of other endocrine, nutritional and metabolic disease: Secondary | ICD-10-CM | POA: Diagnosis not present

## 2017-10-10 DIAGNOSIS — N184 Chronic kidney disease, stage 4 (severe): Secondary | ICD-10-CM | POA: Diagnosis not present

## 2017-10-14 DIAGNOSIS — N184 Chronic kidney disease, stage 4 (severe): Secondary | ICD-10-CM | POA: Diagnosis not present

## 2017-10-14 DIAGNOSIS — I2510 Atherosclerotic heart disease of native coronary artery without angina pectoris: Secondary | ICD-10-CM | POA: Diagnosis not present

## 2017-10-14 DIAGNOSIS — Z8679 Personal history of other diseases of the circulatory system: Secondary | ICD-10-CM | POA: Diagnosis not present

## 2017-10-14 DIAGNOSIS — Z8719 Personal history of other diseases of the digestive system: Secondary | ICD-10-CM | POA: Diagnosis not present

## 2017-10-14 DIAGNOSIS — I5030 Unspecified diastolic (congestive) heart failure: Secondary | ICD-10-CM | POA: Diagnosis not present

## 2017-10-14 DIAGNOSIS — Z8639 Personal history of other endocrine, nutritional and metabolic disease: Secondary | ICD-10-CM | POA: Diagnosis not present

## 2017-10-15 DIAGNOSIS — I5030 Unspecified diastolic (congestive) heart failure: Secondary | ICD-10-CM | POA: Diagnosis not present

## 2017-10-15 DIAGNOSIS — Z8701 Personal history of pneumonia (recurrent): Secondary | ICD-10-CM | POA: Diagnosis not present

## 2017-10-15 DIAGNOSIS — Z8719 Personal history of other diseases of the digestive system: Secondary | ICD-10-CM | POA: Diagnosis not present

## 2017-10-15 DIAGNOSIS — I2510 Atherosclerotic heart disease of native coronary artery without angina pectoris: Secondary | ICD-10-CM | POA: Diagnosis not present

## 2017-10-15 DIAGNOSIS — Z8679 Personal history of other diseases of the circulatory system: Secondary | ICD-10-CM | POA: Diagnosis not present

## 2017-10-15 DIAGNOSIS — Z8739 Personal history of other diseases of the musculoskeletal system and connective tissue: Secondary | ICD-10-CM | POA: Diagnosis not present

## 2017-10-15 DIAGNOSIS — Z862 Personal history of diseases of the blood and blood-forming organs and certain disorders involving the immune mechanism: Secondary | ICD-10-CM | POA: Diagnosis not present

## 2017-10-15 DIAGNOSIS — N184 Chronic kidney disease, stage 4 (severe): Secondary | ICD-10-CM | POA: Diagnosis not present

## 2017-10-15 DIAGNOSIS — Z8639 Personal history of other endocrine, nutritional and metabolic disease: Secondary | ICD-10-CM | POA: Diagnosis not present

## 2017-10-16 DIAGNOSIS — I2510 Atherosclerotic heart disease of native coronary artery without angina pectoris: Secondary | ICD-10-CM | POA: Diagnosis not present

## 2017-10-16 DIAGNOSIS — I5030 Unspecified diastolic (congestive) heart failure: Secondary | ICD-10-CM | POA: Diagnosis not present

## 2017-10-16 DIAGNOSIS — Z8719 Personal history of other diseases of the digestive system: Secondary | ICD-10-CM | POA: Diagnosis not present

## 2017-10-16 DIAGNOSIS — N184 Chronic kidney disease, stage 4 (severe): Secondary | ICD-10-CM | POA: Diagnosis not present

## 2017-10-16 DIAGNOSIS — Z8639 Personal history of other endocrine, nutritional and metabolic disease: Secondary | ICD-10-CM | POA: Diagnosis not present

## 2017-10-16 DIAGNOSIS — Z8679 Personal history of other diseases of the circulatory system: Secondary | ICD-10-CM | POA: Diagnosis not present

## 2017-10-17 DIAGNOSIS — N184 Chronic kidney disease, stage 4 (severe): Secondary | ICD-10-CM | POA: Diagnosis not present

## 2017-10-17 DIAGNOSIS — Z8639 Personal history of other endocrine, nutritional and metabolic disease: Secondary | ICD-10-CM | POA: Diagnosis not present

## 2017-10-17 DIAGNOSIS — I2510 Atherosclerotic heart disease of native coronary artery without angina pectoris: Secondary | ICD-10-CM | POA: Diagnosis not present

## 2017-10-17 DIAGNOSIS — I5030 Unspecified diastolic (congestive) heart failure: Secondary | ICD-10-CM | POA: Diagnosis not present

## 2017-10-17 DIAGNOSIS — Z8719 Personal history of other diseases of the digestive system: Secondary | ICD-10-CM | POA: Diagnosis not present

## 2017-10-17 DIAGNOSIS — Z8679 Personal history of other diseases of the circulatory system: Secondary | ICD-10-CM | POA: Diagnosis not present

## 2017-10-21 DIAGNOSIS — Z8719 Personal history of other diseases of the digestive system: Secondary | ICD-10-CM | POA: Diagnosis not present

## 2017-10-21 DIAGNOSIS — I5030 Unspecified diastolic (congestive) heart failure: Secondary | ICD-10-CM | POA: Diagnosis not present

## 2017-10-21 DIAGNOSIS — I2510 Atherosclerotic heart disease of native coronary artery without angina pectoris: Secondary | ICD-10-CM | POA: Diagnosis not present

## 2017-10-21 DIAGNOSIS — Z8639 Personal history of other endocrine, nutritional and metabolic disease: Secondary | ICD-10-CM | POA: Diagnosis not present

## 2017-10-21 DIAGNOSIS — N184 Chronic kidney disease, stage 4 (severe): Secondary | ICD-10-CM | POA: Diagnosis not present

## 2017-10-21 DIAGNOSIS — Z8679 Personal history of other diseases of the circulatory system: Secondary | ICD-10-CM | POA: Diagnosis not present

## 2017-10-24 DIAGNOSIS — I5030 Unspecified diastolic (congestive) heart failure: Secondary | ICD-10-CM | POA: Diagnosis not present

## 2017-10-24 DIAGNOSIS — Z8639 Personal history of other endocrine, nutritional and metabolic disease: Secondary | ICD-10-CM | POA: Diagnosis not present

## 2017-10-24 DIAGNOSIS — Z8719 Personal history of other diseases of the digestive system: Secondary | ICD-10-CM | POA: Diagnosis not present

## 2017-10-24 DIAGNOSIS — I2510 Atherosclerotic heart disease of native coronary artery without angina pectoris: Secondary | ICD-10-CM | POA: Diagnosis not present

## 2017-10-24 DIAGNOSIS — N184 Chronic kidney disease, stage 4 (severe): Secondary | ICD-10-CM | POA: Diagnosis not present

## 2017-10-24 DIAGNOSIS — Z8679 Personal history of other diseases of the circulatory system: Secondary | ICD-10-CM | POA: Diagnosis not present

## 2017-10-28 DIAGNOSIS — I2510 Atherosclerotic heart disease of native coronary artery without angina pectoris: Secondary | ICD-10-CM | POA: Diagnosis not present

## 2017-10-28 DIAGNOSIS — Z8679 Personal history of other diseases of the circulatory system: Secondary | ICD-10-CM | POA: Diagnosis not present

## 2017-10-28 DIAGNOSIS — I5030 Unspecified diastolic (congestive) heart failure: Secondary | ICD-10-CM | POA: Diagnosis not present

## 2017-10-28 DIAGNOSIS — Z8639 Personal history of other endocrine, nutritional and metabolic disease: Secondary | ICD-10-CM | POA: Diagnosis not present

## 2017-10-28 DIAGNOSIS — Z8719 Personal history of other diseases of the digestive system: Secondary | ICD-10-CM | POA: Diagnosis not present

## 2017-10-28 DIAGNOSIS — N184 Chronic kidney disease, stage 4 (severe): Secondary | ICD-10-CM | POA: Diagnosis not present

## 2017-10-31 DIAGNOSIS — Z8679 Personal history of other diseases of the circulatory system: Secondary | ICD-10-CM | POA: Diagnosis not present

## 2017-10-31 DIAGNOSIS — Z8719 Personal history of other diseases of the digestive system: Secondary | ICD-10-CM | POA: Diagnosis not present

## 2017-10-31 DIAGNOSIS — I5030 Unspecified diastolic (congestive) heart failure: Secondary | ICD-10-CM | POA: Diagnosis not present

## 2017-10-31 DIAGNOSIS — I2510 Atherosclerotic heart disease of native coronary artery without angina pectoris: Secondary | ICD-10-CM | POA: Diagnosis not present

## 2017-10-31 DIAGNOSIS — N184 Chronic kidney disease, stage 4 (severe): Secondary | ICD-10-CM | POA: Diagnosis not present

## 2017-10-31 DIAGNOSIS — Z8639 Personal history of other endocrine, nutritional and metabolic disease: Secondary | ICD-10-CM | POA: Diagnosis not present

## 2017-11-04 DIAGNOSIS — Z8679 Personal history of other diseases of the circulatory system: Secondary | ICD-10-CM | POA: Diagnosis not present

## 2017-11-04 DIAGNOSIS — Z8639 Personal history of other endocrine, nutritional and metabolic disease: Secondary | ICD-10-CM | POA: Diagnosis not present

## 2017-11-04 DIAGNOSIS — Z8719 Personal history of other diseases of the digestive system: Secondary | ICD-10-CM | POA: Diagnosis not present

## 2017-11-04 DIAGNOSIS — I5030 Unspecified diastolic (congestive) heart failure: Secondary | ICD-10-CM | POA: Diagnosis not present

## 2017-11-04 DIAGNOSIS — I2510 Atherosclerotic heart disease of native coronary artery without angina pectoris: Secondary | ICD-10-CM | POA: Diagnosis not present

## 2017-11-04 DIAGNOSIS — N184 Chronic kidney disease, stage 4 (severe): Secondary | ICD-10-CM | POA: Diagnosis not present

## 2017-11-07 DIAGNOSIS — Z8639 Personal history of other endocrine, nutritional and metabolic disease: Secondary | ICD-10-CM | POA: Diagnosis not present

## 2017-11-07 DIAGNOSIS — I2510 Atherosclerotic heart disease of native coronary artery without angina pectoris: Secondary | ICD-10-CM | POA: Diagnosis not present

## 2017-11-07 DIAGNOSIS — I5030 Unspecified diastolic (congestive) heart failure: Secondary | ICD-10-CM | POA: Diagnosis not present

## 2017-11-07 DIAGNOSIS — Z8719 Personal history of other diseases of the digestive system: Secondary | ICD-10-CM | POA: Diagnosis not present

## 2017-11-07 DIAGNOSIS — Z8679 Personal history of other diseases of the circulatory system: Secondary | ICD-10-CM | POA: Diagnosis not present

## 2017-11-07 DIAGNOSIS — N184 Chronic kidney disease, stage 4 (severe): Secondary | ICD-10-CM | POA: Diagnosis not present

## 2017-11-11 DIAGNOSIS — I5030 Unspecified diastolic (congestive) heart failure: Secondary | ICD-10-CM | POA: Diagnosis not present

## 2017-11-11 DIAGNOSIS — Z8719 Personal history of other diseases of the digestive system: Secondary | ICD-10-CM | POA: Diagnosis not present

## 2017-11-11 DIAGNOSIS — N184 Chronic kidney disease, stage 4 (severe): Secondary | ICD-10-CM | POA: Diagnosis not present

## 2017-11-11 DIAGNOSIS — Z8639 Personal history of other endocrine, nutritional and metabolic disease: Secondary | ICD-10-CM | POA: Diagnosis not present

## 2017-11-11 DIAGNOSIS — Z8679 Personal history of other diseases of the circulatory system: Secondary | ICD-10-CM | POA: Diagnosis not present

## 2017-11-11 DIAGNOSIS — I2510 Atherosclerotic heart disease of native coronary artery without angina pectoris: Secondary | ICD-10-CM | POA: Diagnosis not present

## 2017-11-14 DIAGNOSIS — Z8719 Personal history of other diseases of the digestive system: Secondary | ICD-10-CM | POA: Diagnosis not present

## 2017-11-14 DIAGNOSIS — I2510 Atherosclerotic heart disease of native coronary artery without angina pectoris: Secondary | ICD-10-CM | POA: Diagnosis not present

## 2017-11-14 DIAGNOSIS — N184 Chronic kidney disease, stage 4 (severe): Secondary | ICD-10-CM | POA: Diagnosis not present

## 2017-11-14 DIAGNOSIS — I5030 Unspecified diastolic (congestive) heart failure: Secondary | ICD-10-CM | POA: Diagnosis not present

## 2017-11-14 DIAGNOSIS — Z8639 Personal history of other endocrine, nutritional and metabolic disease: Secondary | ICD-10-CM | POA: Diagnosis not present

## 2017-11-14 DIAGNOSIS — Z8679 Personal history of other diseases of the circulatory system: Secondary | ICD-10-CM | POA: Diagnosis not present

## 2017-11-15 DIAGNOSIS — Z8719 Personal history of other diseases of the digestive system: Secondary | ICD-10-CM | POA: Diagnosis not present

## 2017-11-15 DIAGNOSIS — I5030 Unspecified diastolic (congestive) heart failure: Secondary | ICD-10-CM | POA: Diagnosis not present

## 2017-11-15 DIAGNOSIS — Z8679 Personal history of other diseases of the circulatory system: Secondary | ICD-10-CM | POA: Diagnosis not present

## 2017-11-15 DIAGNOSIS — I2510 Atherosclerotic heart disease of native coronary artery without angina pectoris: Secondary | ICD-10-CM | POA: Diagnosis not present

## 2017-11-15 DIAGNOSIS — Z8639 Personal history of other endocrine, nutritional and metabolic disease: Secondary | ICD-10-CM | POA: Diagnosis not present

## 2017-11-15 DIAGNOSIS — N184 Chronic kidney disease, stage 4 (severe): Secondary | ICD-10-CM | POA: Diagnosis not present

## 2017-11-15 DIAGNOSIS — Z8739 Personal history of other diseases of the musculoskeletal system and connective tissue: Secondary | ICD-10-CM | POA: Diagnosis not present

## 2017-11-15 DIAGNOSIS — Z8701 Personal history of pneumonia (recurrent): Secondary | ICD-10-CM | POA: Diagnosis not present

## 2017-11-15 DIAGNOSIS — Z862 Personal history of diseases of the blood and blood-forming organs and certain disorders involving the immune mechanism: Secondary | ICD-10-CM | POA: Diagnosis not present

## 2017-11-18 DIAGNOSIS — I2510 Atherosclerotic heart disease of native coronary artery without angina pectoris: Secondary | ICD-10-CM | POA: Diagnosis not present

## 2017-11-18 DIAGNOSIS — I5030 Unspecified diastolic (congestive) heart failure: Secondary | ICD-10-CM | POA: Diagnosis not present

## 2017-11-18 DIAGNOSIS — Z8639 Personal history of other endocrine, nutritional and metabolic disease: Secondary | ICD-10-CM | POA: Diagnosis not present

## 2017-11-18 DIAGNOSIS — Z8719 Personal history of other diseases of the digestive system: Secondary | ICD-10-CM | POA: Diagnosis not present

## 2017-11-18 DIAGNOSIS — Z8679 Personal history of other diseases of the circulatory system: Secondary | ICD-10-CM | POA: Diagnosis not present

## 2017-11-18 DIAGNOSIS — N184 Chronic kidney disease, stage 4 (severe): Secondary | ICD-10-CM | POA: Diagnosis not present

## 2017-11-21 DIAGNOSIS — Z8679 Personal history of other diseases of the circulatory system: Secondary | ICD-10-CM | POA: Diagnosis not present

## 2017-11-21 DIAGNOSIS — I2510 Atherosclerotic heart disease of native coronary artery without angina pectoris: Secondary | ICD-10-CM | POA: Diagnosis not present

## 2017-11-21 DIAGNOSIS — Z8639 Personal history of other endocrine, nutritional and metabolic disease: Secondary | ICD-10-CM | POA: Diagnosis not present

## 2017-11-21 DIAGNOSIS — Z8719 Personal history of other diseases of the digestive system: Secondary | ICD-10-CM | POA: Diagnosis not present

## 2017-11-21 DIAGNOSIS — N184 Chronic kidney disease, stage 4 (severe): Secondary | ICD-10-CM | POA: Diagnosis not present

## 2017-11-21 DIAGNOSIS — I5030 Unspecified diastolic (congestive) heart failure: Secondary | ICD-10-CM | POA: Diagnosis not present

## 2017-11-22 DIAGNOSIS — I5030 Unspecified diastolic (congestive) heart failure: Secondary | ICD-10-CM | POA: Diagnosis not present

## 2017-11-22 DIAGNOSIS — Z8719 Personal history of other diseases of the digestive system: Secondary | ICD-10-CM | POA: Diagnosis not present

## 2017-11-22 DIAGNOSIS — Z8679 Personal history of other diseases of the circulatory system: Secondary | ICD-10-CM | POA: Diagnosis not present

## 2017-11-22 DIAGNOSIS — N184 Chronic kidney disease, stage 4 (severe): Secondary | ICD-10-CM | POA: Diagnosis not present

## 2017-11-22 DIAGNOSIS — Z8639 Personal history of other endocrine, nutritional and metabolic disease: Secondary | ICD-10-CM | POA: Diagnosis not present

## 2017-11-22 DIAGNOSIS — I2510 Atherosclerotic heart disease of native coronary artery without angina pectoris: Secondary | ICD-10-CM | POA: Diagnosis not present

## 2017-11-25 DIAGNOSIS — I2510 Atherosclerotic heart disease of native coronary artery without angina pectoris: Secondary | ICD-10-CM | POA: Diagnosis not present

## 2017-11-25 DIAGNOSIS — N184 Chronic kidney disease, stage 4 (severe): Secondary | ICD-10-CM | POA: Diagnosis not present

## 2017-11-25 DIAGNOSIS — Z8719 Personal history of other diseases of the digestive system: Secondary | ICD-10-CM | POA: Diagnosis not present

## 2017-11-25 DIAGNOSIS — Z8679 Personal history of other diseases of the circulatory system: Secondary | ICD-10-CM | POA: Diagnosis not present

## 2017-11-25 DIAGNOSIS — Z8639 Personal history of other endocrine, nutritional and metabolic disease: Secondary | ICD-10-CM | POA: Diagnosis not present

## 2017-11-25 DIAGNOSIS — I5030 Unspecified diastolic (congestive) heart failure: Secondary | ICD-10-CM | POA: Diagnosis not present

## 2017-11-28 DIAGNOSIS — I2510 Atherosclerotic heart disease of native coronary artery without angina pectoris: Secondary | ICD-10-CM | POA: Diagnosis not present

## 2017-11-28 DIAGNOSIS — Z8679 Personal history of other diseases of the circulatory system: Secondary | ICD-10-CM | POA: Diagnosis not present

## 2017-11-28 DIAGNOSIS — I5030 Unspecified diastolic (congestive) heart failure: Secondary | ICD-10-CM | POA: Diagnosis not present

## 2017-11-28 DIAGNOSIS — N184 Chronic kidney disease, stage 4 (severe): Secondary | ICD-10-CM | POA: Diagnosis not present

## 2017-11-28 DIAGNOSIS — Z8719 Personal history of other diseases of the digestive system: Secondary | ICD-10-CM | POA: Diagnosis not present

## 2017-11-28 DIAGNOSIS — Z8639 Personal history of other endocrine, nutritional and metabolic disease: Secondary | ICD-10-CM | POA: Diagnosis not present

## 2017-12-02 DIAGNOSIS — N184 Chronic kidney disease, stage 4 (severe): Secondary | ICD-10-CM | POA: Diagnosis not present

## 2017-12-02 DIAGNOSIS — Z8639 Personal history of other endocrine, nutritional and metabolic disease: Secondary | ICD-10-CM | POA: Diagnosis not present

## 2017-12-02 DIAGNOSIS — Z8679 Personal history of other diseases of the circulatory system: Secondary | ICD-10-CM | POA: Diagnosis not present

## 2017-12-02 DIAGNOSIS — I5030 Unspecified diastolic (congestive) heart failure: Secondary | ICD-10-CM | POA: Diagnosis not present

## 2017-12-02 DIAGNOSIS — I2510 Atherosclerotic heart disease of native coronary artery without angina pectoris: Secondary | ICD-10-CM | POA: Diagnosis not present

## 2017-12-02 DIAGNOSIS — Z8719 Personal history of other diseases of the digestive system: Secondary | ICD-10-CM | POA: Diagnosis not present

## 2017-12-05 DIAGNOSIS — Z8639 Personal history of other endocrine, nutritional and metabolic disease: Secondary | ICD-10-CM | POA: Diagnosis not present

## 2017-12-05 DIAGNOSIS — I2510 Atherosclerotic heart disease of native coronary artery without angina pectoris: Secondary | ICD-10-CM | POA: Diagnosis not present

## 2017-12-05 DIAGNOSIS — Z8679 Personal history of other diseases of the circulatory system: Secondary | ICD-10-CM | POA: Diagnosis not present

## 2017-12-05 DIAGNOSIS — N184 Chronic kidney disease, stage 4 (severe): Secondary | ICD-10-CM | POA: Diagnosis not present

## 2017-12-05 DIAGNOSIS — I5030 Unspecified diastolic (congestive) heart failure: Secondary | ICD-10-CM | POA: Diagnosis not present

## 2017-12-05 DIAGNOSIS — Z8719 Personal history of other diseases of the digestive system: Secondary | ICD-10-CM | POA: Diagnosis not present

## 2017-12-09 DIAGNOSIS — I2510 Atherosclerotic heart disease of native coronary artery without angina pectoris: Secondary | ICD-10-CM | POA: Diagnosis not present

## 2017-12-09 DIAGNOSIS — I5030 Unspecified diastolic (congestive) heart failure: Secondary | ICD-10-CM | POA: Diagnosis not present

## 2017-12-09 DIAGNOSIS — N184 Chronic kidney disease, stage 4 (severe): Secondary | ICD-10-CM | POA: Diagnosis not present

## 2017-12-09 DIAGNOSIS — Z8679 Personal history of other diseases of the circulatory system: Secondary | ICD-10-CM | POA: Diagnosis not present

## 2017-12-09 DIAGNOSIS — Z8639 Personal history of other endocrine, nutritional and metabolic disease: Secondary | ICD-10-CM | POA: Diagnosis not present

## 2017-12-09 DIAGNOSIS — Z8719 Personal history of other diseases of the digestive system: Secondary | ICD-10-CM | POA: Diagnosis not present

## 2017-12-15 DIAGNOSIS — Z8679 Personal history of other diseases of the circulatory system: Secondary | ICD-10-CM | POA: Diagnosis not present

## 2017-12-15 DIAGNOSIS — N184 Chronic kidney disease, stage 4 (severe): Secondary | ICD-10-CM | POA: Diagnosis not present

## 2017-12-15 DIAGNOSIS — Z862 Personal history of diseases of the blood and blood-forming organs and certain disorders involving the immune mechanism: Secondary | ICD-10-CM | POA: Diagnosis not present

## 2017-12-15 DIAGNOSIS — I130 Hypertensive heart and chronic kidney disease with heart failure and stage 1 through stage 4 chronic kidney disease, or unspecified chronic kidney disease: Secondary | ICD-10-CM | POA: Diagnosis not present

## 2017-12-15 DIAGNOSIS — Z8639 Personal history of other endocrine, nutritional and metabolic disease: Secondary | ICD-10-CM | POA: Diagnosis not present

## 2017-12-15 DIAGNOSIS — I2510 Atherosclerotic heart disease of native coronary artery without angina pectoris: Secondary | ICD-10-CM | POA: Diagnosis not present

## 2017-12-15 DIAGNOSIS — Z8701 Personal history of pneumonia (recurrent): Secondary | ICD-10-CM | POA: Diagnosis not present

## 2017-12-15 DIAGNOSIS — Z8739 Personal history of other diseases of the musculoskeletal system and connective tissue: Secondary | ICD-10-CM | POA: Diagnosis not present

## 2017-12-15 DIAGNOSIS — I5030 Unspecified diastolic (congestive) heart failure: Secondary | ICD-10-CM | POA: Diagnosis not present

## 2017-12-15 DIAGNOSIS — Z8719 Personal history of other diseases of the digestive system: Secondary | ICD-10-CM | POA: Diagnosis not present

## 2017-12-16 DIAGNOSIS — I130 Hypertensive heart and chronic kidney disease with heart failure and stage 1 through stage 4 chronic kidney disease, or unspecified chronic kidney disease: Secondary | ICD-10-CM | POA: Diagnosis not present

## 2017-12-16 DIAGNOSIS — N184 Chronic kidney disease, stage 4 (severe): Secondary | ICD-10-CM | POA: Diagnosis not present

## 2017-12-16 DIAGNOSIS — I5030 Unspecified diastolic (congestive) heart failure: Secondary | ICD-10-CM | POA: Diagnosis not present

## 2017-12-16 DIAGNOSIS — Z8719 Personal history of other diseases of the digestive system: Secondary | ICD-10-CM | POA: Diagnosis not present

## 2017-12-16 DIAGNOSIS — I2510 Atherosclerotic heart disease of native coronary artery without angina pectoris: Secondary | ICD-10-CM | POA: Diagnosis not present

## 2017-12-16 DIAGNOSIS — Z8639 Personal history of other endocrine, nutritional and metabolic disease: Secondary | ICD-10-CM | POA: Diagnosis not present

## 2017-12-19 DIAGNOSIS — Z8639 Personal history of other endocrine, nutritional and metabolic disease: Secondary | ICD-10-CM | POA: Diagnosis not present

## 2017-12-19 DIAGNOSIS — N184 Chronic kidney disease, stage 4 (severe): Secondary | ICD-10-CM | POA: Diagnosis not present

## 2017-12-19 DIAGNOSIS — I5030 Unspecified diastolic (congestive) heart failure: Secondary | ICD-10-CM | POA: Diagnosis not present

## 2017-12-19 DIAGNOSIS — I2510 Atherosclerotic heart disease of native coronary artery without angina pectoris: Secondary | ICD-10-CM | POA: Diagnosis not present

## 2017-12-19 DIAGNOSIS — I130 Hypertensive heart and chronic kidney disease with heart failure and stage 1 through stage 4 chronic kidney disease, or unspecified chronic kidney disease: Secondary | ICD-10-CM | POA: Diagnosis not present

## 2017-12-19 DIAGNOSIS — Z8719 Personal history of other diseases of the digestive system: Secondary | ICD-10-CM | POA: Diagnosis not present

## 2017-12-20 DIAGNOSIS — I130 Hypertensive heart and chronic kidney disease with heart failure and stage 1 through stage 4 chronic kidney disease, or unspecified chronic kidney disease: Secondary | ICD-10-CM | POA: Diagnosis not present

## 2017-12-20 DIAGNOSIS — I5030 Unspecified diastolic (congestive) heart failure: Secondary | ICD-10-CM | POA: Diagnosis not present

## 2017-12-20 DIAGNOSIS — I2510 Atherosclerotic heart disease of native coronary artery without angina pectoris: Secondary | ICD-10-CM | POA: Diagnosis not present

## 2017-12-20 DIAGNOSIS — N184 Chronic kidney disease, stage 4 (severe): Secondary | ICD-10-CM | POA: Diagnosis not present

## 2017-12-20 DIAGNOSIS — Z8639 Personal history of other endocrine, nutritional and metabolic disease: Secondary | ICD-10-CM | POA: Diagnosis not present

## 2017-12-20 DIAGNOSIS — Z8719 Personal history of other diseases of the digestive system: Secondary | ICD-10-CM | POA: Diagnosis not present

## 2017-12-23 DIAGNOSIS — I130 Hypertensive heart and chronic kidney disease with heart failure and stage 1 through stage 4 chronic kidney disease, or unspecified chronic kidney disease: Secondary | ICD-10-CM | POA: Diagnosis not present

## 2017-12-23 DIAGNOSIS — Z8639 Personal history of other endocrine, nutritional and metabolic disease: Secondary | ICD-10-CM | POA: Diagnosis not present

## 2017-12-23 DIAGNOSIS — Z8719 Personal history of other diseases of the digestive system: Secondary | ICD-10-CM | POA: Diagnosis not present

## 2017-12-23 DIAGNOSIS — N184 Chronic kidney disease, stage 4 (severe): Secondary | ICD-10-CM | POA: Diagnosis not present

## 2017-12-23 DIAGNOSIS — I5030 Unspecified diastolic (congestive) heart failure: Secondary | ICD-10-CM | POA: Diagnosis not present

## 2017-12-23 DIAGNOSIS — I2510 Atherosclerotic heart disease of native coronary artery without angina pectoris: Secondary | ICD-10-CM | POA: Diagnosis not present

## 2017-12-26 DIAGNOSIS — Z8719 Personal history of other diseases of the digestive system: Secondary | ICD-10-CM | POA: Diagnosis not present

## 2017-12-26 DIAGNOSIS — I130 Hypertensive heart and chronic kidney disease with heart failure and stage 1 through stage 4 chronic kidney disease, or unspecified chronic kidney disease: Secondary | ICD-10-CM | POA: Diagnosis not present

## 2017-12-26 DIAGNOSIS — N184 Chronic kidney disease, stage 4 (severe): Secondary | ICD-10-CM | POA: Diagnosis not present

## 2017-12-26 DIAGNOSIS — I2510 Atherosclerotic heart disease of native coronary artery without angina pectoris: Secondary | ICD-10-CM | POA: Diagnosis not present

## 2017-12-26 DIAGNOSIS — I5030 Unspecified diastolic (congestive) heart failure: Secondary | ICD-10-CM | POA: Diagnosis not present

## 2017-12-26 DIAGNOSIS — Z8639 Personal history of other endocrine, nutritional and metabolic disease: Secondary | ICD-10-CM | POA: Diagnosis not present

## 2017-12-30 DIAGNOSIS — N184 Chronic kidney disease, stage 4 (severe): Secondary | ICD-10-CM | POA: Diagnosis not present

## 2017-12-30 DIAGNOSIS — I2510 Atherosclerotic heart disease of native coronary artery without angina pectoris: Secondary | ICD-10-CM | POA: Diagnosis not present

## 2017-12-30 DIAGNOSIS — Z8639 Personal history of other endocrine, nutritional and metabolic disease: Secondary | ICD-10-CM | POA: Diagnosis not present

## 2017-12-30 DIAGNOSIS — I130 Hypertensive heart and chronic kidney disease with heart failure and stage 1 through stage 4 chronic kidney disease, or unspecified chronic kidney disease: Secondary | ICD-10-CM | POA: Diagnosis not present

## 2017-12-30 DIAGNOSIS — I5030 Unspecified diastolic (congestive) heart failure: Secondary | ICD-10-CM | POA: Diagnosis not present

## 2017-12-30 DIAGNOSIS — Z8719 Personal history of other diseases of the digestive system: Secondary | ICD-10-CM | POA: Diagnosis not present

## 2018-01-02 DIAGNOSIS — I130 Hypertensive heart and chronic kidney disease with heart failure and stage 1 through stage 4 chronic kidney disease, or unspecified chronic kidney disease: Secondary | ICD-10-CM | POA: Diagnosis not present

## 2018-01-02 DIAGNOSIS — I2510 Atherosclerotic heart disease of native coronary artery without angina pectoris: Secondary | ICD-10-CM | POA: Diagnosis not present

## 2018-01-02 DIAGNOSIS — Z8639 Personal history of other endocrine, nutritional and metabolic disease: Secondary | ICD-10-CM | POA: Diagnosis not present

## 2018-01-02 DIAGNOSIS — Z8719 Personal history of other diseases of the digestive system: Secondary | ICD-10-CM | POA: Diagnosis not present

## 2018-01-02 DIAGNOSIS — I5030 Unspecified diastolic (congestive) heart failure: Secondary | ICD-10-CM | POA: Diagnosis not present

## 2018-01-02 DIAGNOSIS — N184 Chronic kidney disease, stage 4 (severe): Secondary | ICD-10-CM | POA: Diagnosis not present

## 2018-01-06 DIAGNOSIS — I130 Hypertensive heart and chronic kidney disease with heart failure and stage 1 through stage 4 chronic kidney disease, or unspecified chronic kidney disease: Secondary | ICD-10-CM | POA: Diagnosis not present

## 2018-01-06 DIAGNOSIS — Z8639 Personal history of other endocrine, nutritional and metabolic disease: Secondary | ICD-10-CM | POA: Diagnosis not present

## 2018-01-06 DIAGNOSIS — I5030 Unspecified diastolic (congestive) heart failure: Secondary | ICD-10-CM | POA: Diagnosis not present

## 2018-01-06 DIAGNOSIS — N184 Chronic kidney disease, stage 4 (severe): Secondary | ICD-10-CM | POA: Diagnosis not present

## 2018-01-06 DIAGNOSIS — Z8719 Personal history of other diseases of the digestive system: Secondary | ICD-10-CM | POA: Diagnosis not present

## 2018-01-06 DIAGNOSIS — I2510 Atherosclerotic heart disease of native coronary artery without angina pectoris: Secondary | ICD-10-CM | POA: Diagnosis not present

## 2018-01-10 DIAGNOSIS — I130 Hypertensive heart and chronic kidney disease with heart failure and stage 1 through stage 4 chronic kidney disease, or unspecified chronic kidney disease: Secondary | ICD-10-CM | POA: Diagnosis not present

## 2018-01-10 DIAGNOSIS — N184 Chronic kidney disease, stage 4 (severe): Secondary | ICD-10-CM | POA: Diagnosis not present

## 2018-01-10 DIAGNOSIS — Z8639 Personal history of other endocrine, nutritional and metabolic disease: Secondary | ICD-10-CM | POA: Diagnosis not present

## 2018-01-10 DIAGNOSIS — I2510 Atherosclerotic heart disease of native coronary artery without angina pectoris: Secondary | ICD-10-CM | POA: Diagnosis not present

## 2018-01-10 DIAGNOSIS — Z8719 Personal history of other diseases of the digestive system: Secondary | ICD-10-CM | POA: Diagnosis not present

## 2018-01-10 DIAGNOSIS — I5030 Unspecified diastolic (congestive) heart failure: Secondary | ICD-10-CM | POA: Diagnosis not present

## 2018-01-13 DIAGNOSIS — N184 Chronic kidney disease, stage 4 (severe): Secondary | ICD-10-CM | POA: Diagnosis not present

## 2018-01-13 DIAGNOSIS — I5030 Unspecified diastolic (congestive) heart failure: Secondary | ICD-10-CM | POA: Diagnosis not present

## 2018-01-13 DIAGNOSIS — I130 Hypertensive heart and chronic kidney disease with heart failure and stage 1 through stage 4 chronic kidney disease, or unspecified chronic kidney disease: Secondary | ICD-10-CM | POA: Diagnosis not present

## 2018-01-13 DIAGNOSIS — Z8639 Personal history of other endocrine, nutritional and metabolic disease: Secondary | ICD-10-CM | POA: Diagnosis not present

## 2018-01-13 DIAGNOSIS — I2510 Atherosclerotic heart disease of native coronary artery without angina pectoris: Secondary | ICD-10-CM | POA: Diagnosis not present

## 2018-01-13 DIAGNOSIS — Z8719 Personal history of other diseases of the digestive system: Secondary | ICD-10-CM | POA: Diagnosis not present

## 2018-01-15 DIAGNOSIS — I130 Hypertensive heart and chronic kidney disease with heart failure and stage 1 through stage 4 chronic kidney disease, or unspecified chronic kidney disease: Secondary | ICD-10-CM | POA: Diagnosis not present

## 2018-01-15 DIAGNOSIS — I2510 Atherosclerotic heart disease of native coronary artery without angina pectoris: Secondary | ICD-10-CM | POA: Diagnosis not present

## 2018-01-15 DIAGNOSIS — Z862 Personal history of diseases of the blood and blood-forming organs and certain disorders involving the immune mechanism: Secondary | ICD-10-CM | POA: Diagnosis not present

## 2018-01-15 DIAGNOSIS — I5030 Unspecified diastolic (congestive) heart failure: Secondary | ICD-10-CM | POA: Diagnosis not present

## 2018-01-15 DIAGNOSIS — Z8739 Personal history of other diseases of the musculoskeletal system and connective tissue: Secondary | ICD-10-CM | POA: Diagnosis not present

## 2018-01-15 DIAGNOSIS — Z8679 Personal history of other diseases of the circulatory system: Secondary | ICD-10-CM | POA: Diagnosis not present

## 2018-01-15 DIAGNOSIS — N184 Chronic kidney disease, stage 4 (severe): Secondary | ICD-10-CM | POA: Diagnosis not present

## 2018-01-15 DIAGNOSIS — Z8639 Personal history of other endocrine, nutritional and metabolic disease: Secondary | ICD-10-CM | POA: Diagnosis not present

## 2018-01-15 DIAGNOSIS — Z8719 Personal history of other diseases of the digestive system: Secondary | ICD-10-CM | POA: Diagnosis not present

## 2018-01-15 DIAGNOSIS — Z8701 Personal history of pneumonia (recurrent): Secondary | ICD-10-CM | POA: Diagnosis not present

## 2018-01-16 DIAGNOSIS — Z8639 Personal history of other endocrine, nutritional and metabolic disease: Secondary | ICD-10-CM | POA: Diagnosis not present

## 2018-01-16 DIAGNOSIS — I130 Hypertensive heart and chronic kidney disease with heart failure and stage 1 through stage 4 chronic kidney disease, or unspecified chronic kidney disease: Secondary | ICD-10-CM | POA: Diagnosis not present

## 2018-01-16 DIAGNOSIS — N184 Chronic kidney disease, stage 4 (severe): Secondary | ICD-10-CM | POA: Diagnosis not present

## 2018-01-16 DIAGNOSIS — I2510 Atherosclerotic heart disease of native coronary artery without angina pectoris: Secondary | ICD-10-CM | POA: Diagnosis not present

## 2018-01-16 DIAGNOSIS — I5030 Unspecified diastolic (congestive) heart failure: Secondary | ICD-10-CM | POA: Diagnosis not present

## 2018-01-16 DIAGNOSIS — Z8719 Personal history of other diseases of the digestive system: Secondary | ICD-10-CM | POA: Diagnosis not present

## 2018-01-17 DIAGNOSIS — N184 Chronic kidney disease, stage 4 (severe): Secondary | ICD-10-CM | POA: Diagnosis not present

## 2018-01-17 DIAGNOSIS — I130 Hypertensive heart and chronic kidney disease with heart failure and stage 1 through stage 4 chronic kidney disease, or unspecified chronic kidney disease: Secondary | ICD-10-CM | POA: Diagnosis not present

## 2018-01-17 DIAGNOSIS — Z8719 Personal history of other diseases of the digestive system: Secondary | ICD-10-CM | POA: Diagnosis not present

## 2018-01-17 DIAGNOSIS — I5030 Unspecified diastolic (congestive) heart failure: Secondary | ICD-10-CM | POA: Diagnosis not present

## 2018-01-17 DIAGNOSIS — Z8639 Personal history of other endocrine, nutritional and metabolic disease: Secondary | ICD-10-CM | POA: Diagnosis not present

## 2018-01-17 DIAGNOSIS — I2510 Atherosclerotic heart disease of native coronary artery without angina pectoris: Secondary | ICD-10-CM | POA: Diagnosis not present

## 2018-01-20 DIAGNOSIS — N184 Chronic kidney disease, stage 4 (severe): Secondary | ICD-10-CM | POA: Diagnosis not present

## 2018-01-20 DIAGNOSIS — I5030 Unspecified diastolic (congestive) heart failure: Secondary | ICD-10-CM | POA: Diagnosis not present

## 2018-01-20 DIAGNOSIS — Z8639 Personal history of other endocrine, nutritional and metabolic disease: Secondary | ICD-10-CM | POA: Diagnosis not present

## 2018-01-20 DIAGNOSIS — I130 Hypertensive heart and chronic kidney disease with heart failure and stage 1 through stage 4 chronic kidney disease, or unspecified chronic kidney disease: Secondary | ICD-10-CM | POA: Diagnosis not present

## 2018-01-20 DIAGNOSIS — Z8719 Personal history of other diseases of the digestive system: Secondary | ICD-10-CM | POA: Diagnosis not present

## 2018-01-20 DIAGNOSIS — I2510 Atherosclerotic heart disease of native coronary artery without angina pectoris: Secondary | ICD-10-CM | POA: Diagnosis not present

## 2018-01-23 DIAGNOSIS — Z8639 Personal history of other endocrine, nutritional and metabolic disease: Secondary | ICD-10-CM | POA: Diagnosis not present

## 2018-01-23 DIAGNOSIS — I5030 Unspecified diastolic (congestive) heart failure: Secondary | ICD-10-CM | POA: Diagnosis not present

## 2018-01-23 DIAGNOSIS — I130 Hypertensive heart and chronic kidney disease with heart failure and stage 1 through stage 4 chronic kidney disease, or unspecified chronic kidney disease: Secondary | ICD-10-CM | POA: Diagnosis not present

## 2018-01-23 DIAGNOSIS — N184 Chronic kidney disease, stage 4 (severe): Secondary | ICD-10-CM | POA: Diagnosis not present

## 2018-01-23 DIAGNOSIS — Z8719 Personal history of other diseases of the digestive system: Secondary | ICD-10-CM | POA: Diagnosis not present

## 2018-01-23 DIAGNOSIS — I2510 Atherosclerotic heart disease of native coronary artery without angina pectoris: Secondary | ICD-10-CM | POA: Diagnosis not present

## 2018-01-27 DIAGNOSIS — I5030 Unspecified diastolic (congestive) heart failure: Secondary | ICD-10-CM | POA: Diagnosis not present

## 2018-01-27 DIAGNOSIS — Z8719 Personal history of other diseases of the digestive system: Secondary | ICD-10-CM | POA: Diagnosis not present

## 2018-01-27 DIAGNOSIS — I130 Hypertensive heart and chronic kidney disease with heart failure and stage 1 through stage 4 chronic kidney disease, or unspecified chronic kidney disease: Secondary | ICD-10-CM | POA: Diagnosis not present

## 2018-01-27 DIAGNOSIS — N184 Chronic kidney disease, stage 4 (severe): Secondary | ICD-10-CM | POA: Diagnosis not present

## 2018-01-27 DIAGNOSIS — Z8639 Personal history of other endocrine, nutritional and metabolic disease: Secondary | ICD-10-CM | POA: Diagnosis not present

## 2018-01-27 DIAGNOSIS — I2510 Atherosclerotic heart disease of native coronary artery without angina pectoris: Secondary | ICD-10-CM | POA: Diagnosis not present

## 2018-01-30 DIAGNOSIS — I2510 Atherosclerotic heart disease of native coronary artery without angina pectoris: Secondary | ICD-10-CM | POA: Diagnosis not present

## 2018-01-30 DIAGNOSIS — Z8639 Personal history of other endocrine, nutritional and metabolic disease: Secondary | ICD-10-CM | POA: Diagnosis not present

## 2018-01-30 DIAGNOSIS — I5030 Unspecified diastolic (congestive) heart failure: Secondary | ICD-10-CM | POA: Diagnosis not present

## 2018-01-30 DIAGNOSIS — I130 Hypertensive heart and chronic kidney disease with heart failure and stage 1 through stage 4 chronic kidney disease, or unspecified chronic kidney disease: Secondary | ICD-10-CM | POA: Diagnosis not present

## 2018-01-30 DIAGNOSIS — Z8719 Personal history of other diseases of the digestive system: Secondary | ICD-10-CM | POA: Diagnosis not present

## 2018-01-30 DIAGNOSIS — N184 Chronic kidney disease, stage 4 (severe): Secondary | ICD-10-CM | POA: Diagnosis not present

## 2018-02-03 DIAGNOSIS — N184 Chronic kidney disease, stage 4 (severe): Secondary | ICD-10-CM | POA: Diagnosis not present

## 2018-02-03 DIAGNOSIS — Z8719 Personal history of other diseases of the digestive system: Secondary | ICD-10-CM | POA: Diagnosis not present

## 2018-02-03 DIAGNOSIS — Z8639 Personal history of other endocrine, nutritional and metabolic disease: Secondary | ICD-10-CM | POA: Diagnosis not present

## 2018-02-03 DIAGNOSIS — I5030 Unspecified diastolic (congestive) heart failure: Secondary | ICD-10-CM | POA: Diagnosis not present

## 2018-02-03 DIAGNOSIS — I2510 Atherosclerotic heart disease of native coronary artery without angina pectoris: Secondary | ICD-10-CM | POA: Diagnosis not present

## 2018-02-03 DIAGNOSIS — I130 Hypertensive heart and chronic kidney disease with heart failure and stage 1 through stage 4 chronic kidney disease, or unspecified chronic kidney disease: Secondary | ICD-10-CM | POA: Diagnosis not present

## 2018-02-06 DIAGNOSIS — Z8719 Personal history of other diseases of the digestive system: Secondary | ICD-10-CM | POA: Diagnosis not present

## 2018-02-06 DIAGNOSIS — I2510 Atherosclerotic heart disease of native coronary artery without angina pectoris: Secondary | ICD-10-CM | POA: Diagnosis not present

## 2018-02-06 DIAGNOSIS — Z8639 Personal history of other endocrine, nutritional and metabolic disease: Secondary | ICD-10-CM | POA: Diagnosis not present

## 2018-02-06 DIAGNOSIS — N184 Chronic kidney disease, stage 4 (severe): Secondary | ICD-10-CM | POA: Diagnosis not present

## 2018-02-06 DIAGNOSIS — I130 Hypertensive heart and chronic kidney disease with heart failure and stage 1 through stage 4 chronic kidney disease, or unspecified chronic kidney disease: Secondary | ICD-10-CM | POA: Diagnosis not present

## 2018-02-06 DIAGNOSIS — I5030 Unspecified diastolic (congestive) heart failure: Secondary | ICD-10-CM | POA: Diagnosis not present

## 2018-02-10 DIAGNOSIS — N184 Chronic kidney disease, stage 4 (severe): Secondary | ICD-10-CM | POA: Diagnosis not present

## 2018-02-10 DIAGNOSIS — I2510 Atherosclerotic heart disease of native coronary artery without angina pectoris: Secondary | ICD-10-CM | POA: Diagnosis not present

## 2018-02-10 DIAGNOSIS — I130 Hypertensive heart and chronic kidney disease with heart failure and stage 1 through stage 4 chronic kidney disease, or unspecified chronic kidney disease: Secondary | ICD-10-CM | POA: Diagnosis not present

## 2018-02-10 DIAGNOSIS — Z8639 Personal history of other endocrine, nutritional and metabolic disease: Secondary | ICD-10-CM | POA: Diagnosis not present

## 2018-02-10 DIAGNOSIS — I5030 Unspecified diastolic (congestive) heart failure: Secondary | ICD-10-CM | POA: Diagnosis not present

## 2018-02-10 DIAGNOSIS — Z8719 Personal history of other diseases of the digestive system: Secondary | ICD-10-CM | POA: Diagnosis not present

## 2018-02-11 DIAGNOSIS — H34212 Partial retinal artery occlusion, left eye: Secondary | ICD-10-CM | POA: Diagnosis not present

## 2018-02-11 DIAGNOSIS — H401123 Primary open-angle glaucoma, left eye, severe stage: Secondary | ICD-10-CM | POA: Diagnosis not present

## 2018-02-11 DIAGNOSIS — Z961 Presence of intraocular lens: Secondary | ICD-10-CM | POA: Diagnosis not present

## 2018-02-11 DIAGNOSIS — H34832 Tributary (branch) retinal vein occlusion, left eye, with macular edema: Secondary | ICD-10-CM | POA: Diagnosis not present

## 2018-02-11 DIAGNOSIS — H04123 Dry eye syndrome of bilateral lacrimal glands: Secondary | ICD-10-CM | POA: Diagnosis not present

## 2018-02-11 DIAGNOSIS — E119 Type 2 diabetes mellitus without complications: Secondary | ICD-10-CM | POA: Diagnosis not present

## 2018-02-11 DIAGNOSIS — H401112 Primary open-angle glaucoma, right eye, moderate stage: Secondary | ICD-10-CM | POA: Diagnosis not present

## 2018-02-11 DIAGNOSIS — H353132 Nonexudative age-related macular degeneration, bilateral, intermediate dry stage: Secondary | ICD-10-CM | POA: Diagnosis not present

## 2018-02-13 DIAGNOSIS — Z8719 Personal history of other diseases of the digestive system: Secondary | ICD-10-CM | POA: Diagnosis not present

## 2018-02-13 DIAGNOSIS — I130 Hypertensive heart and chronic kidney disease with heart failure and stage 1 through stage 4 chronic kidney disease, or unspecified chronic kidney disease: Secondary | ICD-10-CM | POA: Diagnosis not present

## 2018-02-13 DIAGNOSIS — I2510 Atherosclerotic heart disease of native coronary artery without angina pectoris: Secondary | ICD-10-CM | POA: Diagnosis not present

## 2018-02-13 DIAGNOSIS — Z8639 Personal history of other endocrine, nutritional and metabolic disease: Secondary | ICD-10-CM | POA: Diagnosis not present

## 2018-02-13 DIAGNOSIS — I5030 Unspecified diastolic (congestive) heart failure: Secondary | ICD-10-CM | POA: Diagnosis not present

## 2018-02-13 DIAGNOSIS — N184 Chronic kidney disease, stage 4 (severe): Secondary | ICD-10-CM | POA: Diagnosis not present

## 2018-02-15 DIAGNOSIS — I130 Hypertensive heart and chronic kidney disease with heart failure and stage 1 through stage 4 chronic kidney disease, or unspecified chronic kidney disease: Secondary | ICD-10-CM | POA: Diagnosis not present

## 2018-02-15 DIAGNOSIS — N184 Chronic kidney disease, stage 4 (severe): Secondary | ICD-10-CM | POA: Diagnosis not present

## 2018-02-15 DIAGNOSIS — Z862 Personal history of diseases of the blood and blood-forming organs and certain disorders involving the immune mechanism: Secondary | ICD-10-CM | POA: Diagnosis not present

## 2018-02-15 DIAGNOSIS — Z8639 Personal history of other endocrine, nutritional and metabolic disease: Secondary | ICD-10-CM | POA: Diagnosis not present

## 2018-02-15 DIAGNOSIS — Z8719 Personal history of other diseases of the digestive system: Secondary | ICD-10-CM | POA: Diagnosis not present

## 2018-02-15 DIAGNOSIS — Z8679 Personal history of other diseases of the circulatory system: Secondary | ICD-10-CM | POA: Diagnosis not present

## 2018-02-15 DIAGNOSIS — I2510 Atherosclerotic heart disease of native coronary artery without angina pectoris: Secondary | ICD-10-CM | POA: Diagnosis not present

## 2018-02-15 DIAGNOSIS — Z8739 Personal history of other diseases of the musculoskeletal system and connective tissue: Secondary | ICD-10-CM | POA: Diagnosis not present

## 2018-02-15 DIAGNOSIS — Z8701 Personal history of pneumonia (recurrent): Secondary | ICD-10-CM | POA: Diagnosis not present

## 2018-02-15 DIAGNOSIS — I5030 Unspecified diastolic (congestive) heart failure: Secondary | ICD-10-CM | POA: Diagnosis not present

## 2018-02-17 DIAGNOSIS — N184 Chronic kidney disease, stage 4 (severe): Secondary | ICD-10-CM | POA: Diagnosis not present

## 2018-02-17 DIAGNOSIS — I130 Hypertensive heart and chronic kidney disease with heart failure and stage 1 through stage 4 chronic kidney disease, or unspecified chronic kidney disease: Secondary | ICD-10-CM | POA: Diagnosis not present

## 2018-02-17 DIAGNOSIS — Z8719 Personal history of other diseases of the digestive system: Secondary | ICD-10-CM | POA: Diagnosis not present

## 2018-02-17 DIAGNOSIS — I2510 Atherosclerotic heart disease of native coronary artery without angina pectoris: Secondary | ICD-10-CM | POA: Diagnosis not present

## 2018-02-17 DIAGNOSIS — Z8639 Personal history of other endocrine, nutritional and metabolic disease: Secondary | ICD-10-CM | POA: Diagnosis not present

## 2018-02-17 DIAGNOSIS — I5030 Unspecified diastolic (congestive) heart failure: Secondary | ICD-10-CM | POA: Diagnosis not present

## 2018-02-20 DIAGNOSIS — Z8639 Personal history of other endocrine, nutritional and metabolic disease: Secondary | ICD-10-CM | POA: Diagnosis not present

## 2018-02-20 DIAGNOSIS — Z8719 Personal history of other diseases of the digestive system: Secondary | ICD-10-CM | POA: Diagnosis not present

## 2018-02-20 DIAGNOSIS — I5030 Unspecified diastolic (congestive) heart failure: Secondary | ICD-10-CM | POA: Diagnosis not present

## 2018-02-20 DIAGNOSIS — I2510 Atherosclerotic heart disease of native coronary artery without angina pectoris: Secondary | ICD-10-CM | POA: Diagnosis not present

## 2018-02-20 DIAGNOSIS — N184 Chronic kidney disease, stage 4 (severe): Secondary | ICD-10-CM | POA: Diagnosis not present

## 2018-02-20 DIAGNOSIS — I130 Hypertensive heart and chronic kidney disease with heart failure and stage 1 through stage 4 chronic kidney disease, or unspecified chronic kidney disease: Secondary | ICD-10-CM | POA: Diagnosis not present

## 2018-02-24 DIAGNOSIS — I5030 Unspecified diastolic (congestive) heart failure: Secondary | ICD-10-CM | POA: Diagnosis not present

## 2018-02-24 DIAGNOSIS — Z8639 Personal history of other endocrine, nutritional and metabolic disease: Secondary | ICD-10-CM | POA: Diagnosis not present

## 2018-02-24 DIAGNOSIS — I2510 Atherosclerotic heart disease of native coronary artery without angina pectoris: Secondary | ICD-10-CM | POA: Diagnosis not present

## 2018-02-24 DIAGNOSIS — Z8719 Personal history of other diseases of the digestive system: Secondary | ICD-10-CM | POA: Diagnosis not present

## 2018-02-24 DIAGNOSIS — N184 Chronic kidney disease, stage 4 (severe): Secondary | ICD-10-CM | POA: Diagnosis not present

## 2018-02-24 DIAGNOSIS — I130 Hypertensive heart and chronic kidney disease with heart failure and stage 1 through stage 4 chronic kidney disease, or unspecified chronic kidney disease: Secondary | ICD-10-CM | POA: Diagnosis not present

## 2018-02-27 DIAGNOSIS — Z8719 Personal history of other diseases of the digestive system: Secondary | ICD-10-CM | POA: Diagnosis not present

## 2018-02-27 DIAGNOSIS — I130 Hypertensive heart and chronic kidney disease with heart failure and stage 1 through stage 4 chronic kidney disease, or unspecified chronic kidney disease: Secondary | ICD-10-CM | POA: Diagnosis not present

## 2018-02-27 DIAGNOSIS — I2510 Atherosclerotic heart disease of native coronary artery without angina pectoris: Secondary | ICD-10-CM | POA: Diagnosis not present

## 2018-02-27 DIAGNOSIS — I5030 Unspecified diastolic (congestive) heart failure: Secondary | ICD-10-CM | POA: Diagnosis not present

## 2018-02-27 DIAGNOSIS — Z8639 Personal history of other endocrine, nutritional and metabolic disease: Secondary | ICD-10-CM | POA: Diagnosis not present

## 2018-02-27 DIAGNOSIS — N184 Chronic kidney disease, stage 4 (severe): Secondary | ICD-10-CM | POA: Diagnosis not present

## 2018-03-03 DIAGNOSIS — N184 Chronic kidney disease, stage 4 (severe): Secondary | ICD-10-CM | POA: Diagnosis not present

## 2018-03-03 DIAGNOSIS — I2510 Atherosclerotic heart disease of native coronary artery without angina pectoris: Secondary | ICD-10-CM | POA: Diagnosis not present

## 2018-03-03 DIAGNOSIS — I130 Hypertensive heart and chronic kidney disease with heart failure and stage 1 through stage 4 chronic kidney disease, or unspecified chronic kidney disease: Secondary | ICD-10-CM | POA: Diagnosis not present

## 2018-03-03 DIAGNOSIS — I5030 Unspecified diastolic (congestive) heart failure: Secondary | ICD-10-CM | POA: Diagnosis not present

## 2018-03-03 DIAGNOSIS — Z8719 Personal history of other diseases of the digestive system: Secondary | ICD-10-CM | POA: Diagnosis not present

## 2018-03-03 DIAGNOSIS — Z8639 Personal history of other endocrine, nutritional and metabolic disease: Secondary | ICD-10-CM | POA: Diagnosis not present

## 2018-03-05 DIAGNOSIS — I130 Hypertensive heart and chronic kidney disease with heart failure and stage 1 through stage 4 chronic kidney disease, or unspecified chronic kidney disease: Secondary | ICD-10-CM | POA: Diagnosis not present

## 2018-03-05 DIAGNOSIS — I2510 Atherosclerotic heart disease of native coronary artery without angina pectoris: Secondary | ICD-10-CM | POA: Diagnosis not present

## 2018-03-05 DIAGNOSIS — N184 Chronic kidney disease, stage 4 (severe): Secondary | ICD-10-CM | POA: Diagnosis not present

## 2018-03-05 DIAGNOSIS — Z8639 Personal history of other endocrine, nutritional and metabolic disease: Secondary | ICD-10-CM | POA: Diagnosis not present

## 2018-03-05 DIAGNOSIS — I5030 Unspecified diastolic (congestive) heart failure: Secondary | ICD-10-CM | POA: Diagnosis not present

## 2018-03-05 DIAGNOSIS — Z8719 Personal history of other diseases of the digestive system: Secondary | ICD-10-CM | POA: Diagnosis not present

## 2018-03-06 DIAGNOSIS — I130 Hypertensive heart and chronic kidney disease with heart failure and stage 1 through stage 4 chronic kidney disease, or unspecified chronic kidney disease: Secondary | ICD-10-CM | POA: Diagnosis not present

## 2018-03-06 DIAGNOSIS — Z8639 Personal history of other endocrine, nutritional and metabolic disease: Secondary | ICD-10-CM | POA: Diagnosis not present

## 2018-03-06 DIAGNOSIS — I2510 Atherosclerotic heart disease of native coronary artery without angina pectoris: Secondary | ICD-10-CM | POA: Diagnosis not present

## 2018-03-06 DIAGNOSIS — N184 Chronic kidney disease, stage 4 (severe): Secondary | ICD-10-CM | POA: Diagnosis not present

## 2018-03-06 DIAGNOSIS — Z8719 Personal history of other diseases of the digestive system: Secondary | ICD-10-CM | POA: Diagnosis not present

## 2018-03-06 DIAGNOSIS — I5030 Unspecified diastolic (congestive) heart failure: Secondary | ICD-10-CM | POA: Diagnosis not present

## 2018-03-10 DIAGNOSIS — Z8639 Personal history of other endocrine, nutritional and metabolic disease: Secondary | ICD-10-CM | POA: Diagnosis not present

## 2018-03-10 DIAGNOSIS — I5030 Unspecified diastolic (congestive) heart failure: Secondary | ICD-10-CM | POA: Diagnosis not present

## 2018-03-10 DIAGNOSIS — I130 Hypertensive heart and chronic kidney disease with heart failure and stage 1 through stage 4 chronic kidney disease, or unspecified chronic kidney disease: Secondary | ICD-10-CM | POA: Diagnosis not present

## 2018-03-10 DIAGNOSIS — N184 Chronic kidney disease, stage 4 (severe): Secondary | ICD-10-CM | POA: Diagnosis not present

## 2018-03-10 DIAGNOSIS — Z8719 Personal history of other diseases of the digestive system: Secondary | ICD-10-CM | POA: Diagnosis not present

## 2018-03-10 DIAGNOSIS — I2510 Atherosclerotic heart disease of native coronary artery without angina pectoris: Secondary | ICD-10-CM | POA: Diagnosis not present

## 2018-03-13 DIAGNOSIS — I2510 Atherosclerotic heart disease of native coronary artery without angina pectoris: Secondary | ICD-10-CM | POA: Diagnosis not present

## 2018-03-13 DIAGNOSIS — Z8639 Personal history of other endocrine, nutritional and metabolic disease: Secondary | ICD-10-CM | POA: Diagnosis not present

## 2018-03-13 DIAGNOSIS — I5030 Unspecified diastolic (congestive) heart failure: Secondary | ICD-10-CM | POA: Diagnosis not present

## 2018-03-13 DIAGNOSIS — N184 Chronic kidney disease, stage 4 (severe): Secondary | ICD-10-CM | POA: Diagnosis not present

## 2018-03-13 DIAGNOSIS — Z8719 Personal history of other diseases of the digestive system: Secondary | ICD-10-CM | POA: Diagnosis not present

## 2018-03-13 DIAGNOSIS — I130 Hypertensive heart and chronic kidney disease with heart failure and stage 1 through stage 4 chronic kidney disease, or unspecified chronic kidney disease: Secondary | ICD-10-CM | POA: Diagnosis not present

## 2018-03-15 DIAGNOSIS — I2510 Atherosclerotic heart disease of native coronary artery without angina pectoris: Secondary | ICD-10-CM | POA: Diagnosis not present

## 2018-03-15 DIAGNOSIS — N184 Chronic kidney disease, stage 4 (severe): Secondary | ICD-10-CM | POA: Diagnosis not present

## 2018-03-15 DIAGNOSIS — I130 Hypertensive heart and chronic kidney disease with heart failure and stage 1 through stage 4 chronic kidney disease, or unspecified chronic kidney disease: Secondary | ICD-10-CM | POA: Diagnosis not present

## 2018-03-15 DIAGNOSIS — I5030 Unspecified diastolic (congestive) heart failure: Secondary | ICD-10-CM | POA: Diagnosis not present

## 2018-03-15 DIAGNOSIS — Z8719 Personal history of other diseases of the digestive system: Secondary | ICD-10-CM | POA: Diagnosis not present

## 2018-03-15 DIAGNOSIS — Z8639 Personal history of other endocrine, nutritional and metabolic disease: Secondary | ICD-10-CM | POA: Diagnosis not present

## 2018-03-16 DIAGNOSIS — I130 Hypertensive heart and chronic kidney disease with heart failure and stage 1 through stage 4 chronic kidney disease, or unspecified chronic kidney disease: Secondary | ICD-10-CM | POA: Diagnosis not present

## 2018-03-16 DIAGNOSIS — Z8719 Personal history of other diseases of the digestive system: Secondary | ICD-10-CM | POA: Diagnosis not present

## 2018-03-16 DIAGNOSIS — Z8679 Personal history of other diseases of the circulatory system: Secondary | ICD-10-CM | POA: Diagnosis not present

## 2018-03-16 DIAGNOSIS — Z8639 Personal history of other endocrine, nutritional and metabolic disease: Secondary | ICD-10-CM | POA: Diagnosis not present

## 2018-03-16 DIAGNOSIS — Z8739 Personal history of other diseases of the musculoskeletal system and connective tissue: Secondary | ICD-10-CM | POA: Diagnosis not present

## 2018-03-16 DIAGNOSIS — Z862 Personal history of diseases of the blood and blood-forming organs and certain disorders involving the immune mechanism: Secondary | ICD-10-CM | POA: Diagnosis not present

## 2018-03-16 DIAGNOSIS — N184 Chronic kidney disease, stage 4 (severe): Secondary | ICD-10-CM | POA: Diagnosis not present

## 2018-03-16 DIAGNOSIS — I2510 Atherosclerotic heart disease of native coronary artery without angina pectoris: Secondary | ICD-10-CM | POA: Diagnosis not present

## 2018-03-16 DIAGNOSIS — I5030 Unspecified diastolic (congestive) heart failure: Secondary | ICD-10-CM | POA: Diagnosis not present

## 2018-03-16 DIAGNOSIS — Z8701 Personal history of pneumonia (recurrent): Secondary | ICD-10-CM | POA: Diagnosis not present

## 2018-03-17 DIAGNOSIS — I2510 Atherosclerotic heart disease of native coronary artery without angina pectoris: Secondary | ICD-10-CM | POA: Diagnosis not present

## 2018-03-17 DIAGNOSIS — N184 Chronic kidney disease, stage 4 (severe): Secondary | ICD-10-CM | POA: Diagnosis not present

## 2018-03-17 DIAGNOSIS — Z8719 Personal history of other diseases of the digestive system: Secondary | ICD-10-CM | POA: Diagnosis not present

## 2018-03-17 DIAGNOSIS — Z8639 Personal history of other endocrine, nutritional and metabolic disease: Secondary | ICD-10-CM | POA: Diagnosis not present

## 2018-03-17 DIAGNOSIS — I5030 Unspecified diastolic (congestive) heart failure: Secondary | ICD-10-CM | POA: Diagnosis not present

## 2018-03-17 DIAGNOSIS — I130 Hypertensive heart and chronic kidney disease with heart failure and stage 1 through stage 4 chronic kidney disease, or unspecified chronic kidney disease: Secondary | ICD-10-CM | POA: Diagnosis not present

## 2018-03-18 DIAGNOSIS — I130 Hypertensive heart and chronic kidney disease with heart failure and stage 1 through stage 4 chronic kidney disease, or unspecified chronic kidney disease: Secondary | ICD-10-CM | POA: Diagnosis not present

## 2018-03-18 DIAGNOSIS — N184 Chronic kidney disease, stage 4 (severe): Secondary | ICD-10-CM | POA: Diagnosis not present

## 2018-03-18 DIAGNOSIS — Z8639 Personal history of other endocrine, nutritional and metabolic disease: Secondary | ICD-10-CM | POA: Diagnosis not present

## 2018-03-18 DIAGNOSIS — Z8719 Personal history of other diseases of the digestive system: Secondary | ICD-10-CM | POA: Diagnosis not present

## 2018-03-18 DIAGNOSIS — I2510 Atherosclerotic heart disease of native coronary artery without angina pectoris: Secondary | ICD-10-CM | POA: Diagnosis not present

## 2018-03-18 DIAGNOSIS — I5030 Unspecified diastolic (congestive) heart failure: Secondary | ICD-10-CM | POA: Diagnosis not present

## 2018-03-19 DIAGNOSIS — I5030 Unspecified diastolic (congestive) heart failure: Secondary | ICD-10-CM | POA: Diagnosis not present

## 2018-03-19 DIAGNOSIS — I2510 Atherosclerotic heart disease of native coronary artery without angina pectoris: Secondary | ICD-10-CM | POA: Diagnosis not present

## 2018-03-19 DIAGNOSIS — Z8639 Personal history of other endocrine, nutritional and metabolic disease: Secondary | ICD-10-CM | POA: Diagnosis not present

## 2018-03-19 DIAGNOSIS — Z8719 Personal history of other diseases of the digestive system: Secondary | ICD-10-CM | POA: Diagnosis not present

## 2018-03-19 DIAGNOSIS — N184 Chronic kidney disease, stage 4 (severe): Secondary | ICD-10-CM | POA: Diagnosis not present

## 2018-03-19 DIAGNOSIS — I130 Hypertensive heart and chronic kidney disease with heart failure and stage 1 through stage 4 chronic kidney disease, or unspecified chronic kidney disease: Secondary | ICD-10-CM | POA: Diagnosis not present

## 2018-03-20 DIAGNOSIS — I5030 Unspecified diastolic (congestive) heart failure: Secondary | ICD-10-CM | POA: Diagnosis not present

## 2018-03-20 DIAGNOSIS — I2510 Atherosclerotic heart disease of native coronary artery without angina pectoris: Secondary | ICD-10-CM | POA: Diagnosis not present

## 2018-03-20 DIAGNOSIS — I130 Hypertensive heart and chronic kidney disease with heart failure and stage 1 through stage 4 chronic kidney disease, or unspecified chronic kidney disease: Secondary | ICD-10-CM | POA: Diagnosis not present

## 2018-03-20 DIAGNOSIS — N184 Chronic kidney disease, stage 4 (severe): Secondary | ICD-10-CM | POA: Diagnosis not present

## 2018-03-20 DIAGNOSIS — Z8719 Personal history of other diseases of the digestive system: Secondary | ICD-10-CM | POA: Diagnosis not present

## 2018-03-20 DIAGNOSIS — Z8639 Personal history of other endocrine, nutritional and metabolic disease: Secondary | ICD-10-CM | POA: Diagnosis not present

## 2018-03-21 DIAGNOSIS — Z8639 Personal history of other endocrine, nutritional and metabolic disease: Secondary | ICD-10-CM | POA: Diagnosis not present

## 2018-03-21 DIAGNOSIS — Z8719 Personal history of other diseases of the digestive system: Secondary | ICD-10-CM | POA: Diagnosis not present

## 2018-03-21 DIAGNOSIS — I2510 Atherosclerotic heart disease of native coronary artery without angina pectoris: Secondary | ICD-10-CM | POA: Diagnosis not present

## 2018-03-21 DIAGNOSIS — I130 Hypertensive heart and chronic kidney disease with heart failure and stage 1 through stage 4 chronic kidney disease, or unspecified chronic kidney disease: Secondary | ICD-10-CM | POA: Diagnosis not present

## 2018-03-21 DIAGNOSIS — I5030 Unspecified diastolic (congestive) heart failure: Secondary | ICD-10-CM | POA: Diagnosis not present

## 2018-03-21 DIAGNOSIS — N184 Chronic kidney disease, stage 4 (severe): Secondary | ICD-10-CM | POA: Diagnosis not present

## 2018-03-24 DIAGNOSIS — I130 Hypertensive heart and chronic kidney disease with heart failure and stage 1 through stage 4 chronic kidney disease, or unspecified chronic kidney disease: Secondary | ICD-10-CM | POA: Diagnosis not present

## 2018-03-24 DIAGNOSIS — I2510 Atherosclerotic heart disease of native coronary artery without angina pectoris: Secondary | ICD-10-CM | POA: Diagnosis not present

## 2018-03-24 DIAGNOSIS — I5030 Unspecified diastolic (congestive) heart failure: Secondary | ICD-10-CM | POA: Diagnosis not present

## 2018-03-24 DIAGNOSIS — N184 Chronic kidney disease, stage 4 (severe): Secondary | ICD-10-CM | POA: Diagnosis not present

## 2018-03-24 DIAGNOSIS — Z8639 Personal history of other endocrine, nutritional and metabolic disease: Secondary | ICD-10-CM | POA: Diagnosis not present

## 2018-03-24 DIAGNOSIS — Z8719 Personal history of other diseases of the digestive system: Secondary | ICD-10-CM | POA: Diagnosis not present

## 2018-03-26 DIAGNOSIS — Z8719 Personal history of other diseases of the digestive system: Secondary | ICD-10-CM | POA: Diagnosis not present

## 2018-03-26 DIAGNOSIS — I130 Hypertensive heart and chronic kidney disease with heart failure and stage 1 through stage 4 chronic kidney disease, or unspecified chronic kidney disease: Secondary | ICD-10-CM | POA: Diagnosis not present

## 2018-03-26 DIAGNOSIS — I2510 Atherosclerotic heart disease of native coronary artery without angina pectoris: Secondary | ICD-10-CM | POA: Diagnosis not present

## 2018-03-26 DIAGNOSIS — Z8639 Personal history of other endocrine, nutritional and metabolic disease: Secondary | ICD-10-CM | POA: Diagnosis not present

## 2018-03-26 DIAGNOSIS — I5030 Unspecified diastolic (congestive) heart failure: Secondary | ICD-10-CM | POA: Diagnosis not present

## 2018-03-26 DIAGNOSIS — N184 Chronic kidney disease, stage 4 (severe): Secondary | ICD-10-CM | POA: Diagnosis not present

## 2018-03-27 DIAGNOSIS — N184 Chronic kidney disease, stage 4 (severe): Secondary | ICD-10-CM | POA: Diagnosis not present

## 2018-03-27 DIAGNOSIS — Z8639 Personal history of other endocrine, nutritional and metabolic disease: Secondary | ICD-10-CM | POA: Diagnosis not present

## 2018-03-27 DIAGNOSIS — Z8719 Personal history of other diseases of the digestive system: Secondary | ICD-10-CM | POA: Diagnosis not present

## 2018-03-27 DIAGNOSIS — I2510 Atherosclerotic heart disease of native coronary artery without angina pectoris: Secondary | ICD-10-CM | POA: Diagnosis not present

## 2018-03-27 DIAGNOSIS — I130 Hypertensive heart and chronic kidney disease with heart failure and stage 1 through stage 4 chronic kidney disease, or unspecified chronic kidney disease: Secondary | ICD-10-CM | POA: Diagnosis not present

## 2018-03-27 DIAGNOSIS — I5030 Unspecified diastolic (congestive) heart failure: Secondary | ICD-10-CM | POA: Diagnosis not present

## 2018-03-31 DIAGNOSIS — Z8639 Personal history of other endocrine, nutritional and metabolic disease: Secondary | ICD-10-CM | POA: Diagnosis not present

## 2018-03-31 DIAGNOSIS — I130 Hypertensive heart and chronic kidney disease with heart failure and stage 1 through stage 4 chronic kidney disease, or unspecified chronic kidney disease: Secondary | ICD-10-CM | POA: Diagnosis not present

## 2018-03-31 DIAGNOSIS — I2510 Atherosclerotic heart disease of native coronary artery without angina pectoris: Secondary | ICD-10-CM | POA: Diagnosis not present

## 2018-03-31 DIAGNOSIS — Z8719 Personal history of other diseases of the digestive system: Secondary | ICD-10-CM | POA: Diagnosis not present

## 2018-03-31 DIAGNOSIS — I5030 Unspecified diastolic (congestive) heart failure: Secondary | ICD-10-CM | POA: Diagnosis not present

## 2018-03-31 DIAGNOSIS — N184 Chronic kidney disease, stage 4 (severe): Secondary | ICD-10-CM | POA: Diagnosis not present

## 2018-04-03 DIAGNOSIS — I2510 Atherosclerotic heart disease of native coronary artery without angina pectoris: Secondary | ICD-10-CM | POA: Diagnosis not present

## 2018-04-03 DIAGNOSIS — Z8719 Personal history of other diseases of the digestive system: Secondary | ICD-10-CM | POA: Diagnosis not present

## 2018-04-03 DIAGNOSIS — N184 Chronic kidney disease, stage 4 (severe): Secondary | ICD-10-CM | POA: Diagnosis not present

## 2018-04-03 DIAGNOSIS — I5030 Unspecified diastolic (congestive) heart failure: Secondary | ICD-10-CM | POA: Diagnosis not present

## 2018-04-03 DIAGNOSIS — Z8639 Personal history of other endocrine, nutritional and metabolic disease: Secondary | ICD-10-CM | POA: Diagnosis not present

## 2018-04-03 DIAGNOSIS — I130 Hypertensive heart and chronic kidney disease with heart failure and stage 1 through stage 4 chronic kidney disease, or unspecified chronic kidney disease: Secondary | ICD-10-CM | POA: Diagnosis not present

## 2018-04-04 DIAGNOSIS — N184 Chronic kidney disease, stage 4 (severe): Secondary | ICD-10-CM | POA: Diagnosis not present

## 2018-04-04 DIAGNOSIS — I130 Hypertensive heart and chronic kidney disease with heart failure and stage 1 through stage 4 chronic kidney disease, or unspecified chronic kidney disease: Secondary | ICD-10-CM | POA: Diagnosis not present

## 2018-04-04 DIAGNOSIS — I5030 Unspecified diastolic (congestive) heart failure: Secondary | ICD-10-CM | POA: Diagnosis not present

## 2018-04-04 DIAGNOSIS — Z8719 Personal history of other diseases of the digestive system: Secondary | ICD-10-CM | POA: Diagnosis not present

## 2018-04-04 DIAGNOSIS — Z8639 Personal history of other endocrine, nutritional and metabolic disease: Secondary | ICD-10-CM | POA: Diagnosis not present

## 2018-04-04 DIAGNOSIS — I2510 Atherosclerotic heart disease of native coronary artery without angina pectoris: Secondary | ICD-10-CM | POA: Diagnosis not present

## 2018-04-07 DIAGNOSIS — I5030 Unspecified diastolic (congestive) heart failure: Secondary | ICD-10-CM | POA: Diagnosis not present

## 2018-04-07 DIAGNOSIS — N184 Chronic kidney disease, stage 4 (severe): Secondary | ICD-10-CM | POA: Diagnosis not present

## 2018-04-07 DIAGNOSIS — Z8719 Personal history of other diseases of the digestive system: Secondary | ICD-10-CM | POA: Diagnosis not present

## 2018-04-07 DIAGNOSIS — Z8639 Personal history of other endocrine, nutritional and metabolic disease: Secondary | ICD-10-CM | POA: Diagnosis not present

## 2018-04-07 DIAGNOSIS — I130 Hypertensive heart and chronic kidney disease with heart failure and stage 1 through stage 4 chronic kidney disease, or unspecified chronic kidney disease: Secondary | ICD-10-CM | POA: Diagnosis not present

## 2018-04-07 DIAGNOSIS — I2510 Atherosclerotic heart disease of native coronary artery without angina pectoris: Secondary | ICD-10-CM | POA: Diagnosis not present

## 2018-04-14 DIAGNOSIS — N184 Chronic kidney disease, stage 4 (severe): Secondary | ICD-10-CM | POA: Diagnosis not present

## 2018-04-14 DIAGNOSIS — I130 Hypertensive heart and chronic kidney disease with heart failure and stage 1 through stage 4 chronic kidney disease, or unspecified chronic kidney disease: Secondary | ICD-10-CM | POA: Diagnosis not present

## 2018-04-14 DIAGNOSIS — Z8639 Personal history of other endocrine, nutritional and metabolic disease: Secondary | ICD-10-CM | POA: Diagnosis not present

## 2018-04-14 DIAGNOSIS — Z8719 Personal history of other diseases of the digestive system: Secondary | ICD-10-CM | POA: Diagnosis not present

## 2018-04-14 DIAGNOSIS — I5030 Unspecified diastolic (congestive) heart failure: Secondary | ICD-10-CM | POA: Diagnosis not present

## 2018-04-14 DIAGNOSIS — I2510 Atherosclerotic heart disease of native coronary artery without angina pectoris: Secondary | ICD-10-CM | POA: Diagnosis not present

## 2018-04-16 DIAGNOSIS — Z862 Personal history of diseases of the blood and blood-forming organs and certain disorders involving the immune mechanism: Secondary | ICD-10-CM | POA: Diagnosis not present

## 2018-04-16 DIAGNOSIS — N184 Chronic kidney disease, stage 4 (severe): Secondary | ICD-10-CM | POA: Diagnosis not present

## 2018-04-16 DIAGNOSIS — Z8719 Personal history of other diseases of the digestive system: Secondary | ICD-10-CM | POA: Diagnosis not present

## 2018-04-16 DIAGNOSIS — I5030 Unspecified diastolic (congestive) heart failure: Secondary | ICD-10-CM | POA: Diagnosis not present

## 2018-04-16 DIAGNOSIS — Z8679 Personal history of other diseases of the circulatory system: Secondary | ICD-10-CM | POA: Diagnosis not present

## 2018-04-16 DIAGNOSIS — Z8739 Personal history of other diseases of the musculoskeletal system and connective tissue: Secondary | ICD-10-CM | POA: Diagnosis not present

## 2018-04-16 DIAGNOSIS — I130 Hypertensive heart and chronic kidney disease with heart failure and stage 1 through stage 4 chronic kidney disease, or unspecified chronic kidney disease: Secondary | ICD-10-CM | POA: Diagnosis not present

## 2018-04-16 DIAGNOSIS — Z8701 Personal history of pneumonia (recurrent): Secondary | ICD-10-CM | POA: Diagnosis not present

## 2018-04-16 DIAGNOSIS — Z8639 Personal history of other endocrine, nutritional and metabolic disease: Secondary | ICD-10-CM | POA: Diagnosis not present

## 2018-04-16 DIAGNOSIS — I2510 Atherosclerotic heart disease of native coronary artery without angina pectoris: Secondary | ICD-10-CM | POA: Diagnosis not present

## 2018-04-17 DIAGNOSIS — I5030 Unspecified diastolic (congestive) heart failure: Secondary | ICD-10-CM | POA: Diagnosis not present

## 2018-04-17 DIAGNOSIS — N184 Chronic kidney disease, stage 4 (severe): Secondary | ICD-10-CM | POA: Diagnosis not present

## 2018-04-17 DIAGNOSIS — Z8719 Personal history of other diseases of the digestive system: Secondary | ICD-10-CM | POA: Diagnosis not present

## 2018-04-17 DIAGNOSIS — I2510 Atherosclerotic heart disease of native coronary artery without angina pectoris: Secondary | ICD-10-CM | POA: Diagnosis not present

## 2018-04-17 DIAGNOSIS — I130 Hypertensive heart and chronic kidney disease with heart failure and stage 1 through stage 4 chronic kidney disease, or unspecified chronic kidney disease: Secondary | ICD-10-CM | POA: Diagnosis not present

## 2018-04-17 DIAGNOSIS — Z8639 Personal history of other endocrine, nutritional and metabolic disease: Secondary | ICD-10-CM | POA: Diagnosis not present

## 2018-04-21 DIAGNOSIS — I130 Hypertensive heart and chronic kidney disease with heart failure and stage 1 through stage 4 chronic kidney disease, or unspecified chronic kidney disease: Secondary | ICD-10-CM | POA: Diagnosis not present

## 2018-04-21 DIAGNOSIS — N184 Chronic kidney disease, stage 4 (severe): Secondary | ICD-10-CM | POA: Diagnosis not present

## 2018-04-21 DIAGNOSIS — I5030 Unspecified diastolic (congestive) heart failure: Secondary | ICD-10-CM | POA: Diagnosis not present

## 2018-04-21 DIAGNOSIS — Z8719 Personal history of other diseases of the digestive system: Secondary | ICD-10-CM | POA: Diagnosis not present

## 2018-04-21 DIAGNOSIS — Z8639 Personal history of other endocrine, nutritional and metabolic disease: Secondary | ICD-10-CM | POA: Diagnosis not present

## 2018-04-21 DIAGNOSIS — I2510 Atherosclerotic heart disease of native coronary artery without angina pectoris: Secondary | ICD-10-CM | POA: Diagnosis not present

## 2018-05-16 DIAGNOSIS — I2510 Atherosclerotic heart disease of native coronary artery without angina pectoris: Secondary | ICD-10-CM | POA: Diagnosis not present

## 2018-05-16 DIAGNOSIS — N184 Chronic kidney disease, stage 4 (severe): Secondary | ICD-10-CM | POA: Diagnosis not present

## 2018-05-16 DIAGNOSIS — I130 Hypertensive heart and chronic kidney disease with heart failure and stage 1 through stage 4 chronic kidney disease, or unspecified chronic kidney disease: Secondary | ICD-10-CM | POA: Diagnosis not present

## 2018-05-16 DIAGNOSIS — I5030 Unspecified diastolic (congestive) heart failure: Secondary | ICD-10-CM | POA: Diagnosis not present

## 2018-05-16 DIAGNOSIS — Z8639 Personal history of other endocrine, nutritional and metabolic disease: Secondary | ICD-10-CM | POA: Diagnosis not present

## 2018-05-16 DIAGNOSIS — Z8679 Personal history of other diseases of the circulatory system: Secondary | ICD-10-CM | POA: Diagnosis not present

## 2018-05-16 DIAGNOSIS — Z862 Personal history of diseases of the blood and blood-forming organs and certain disorders involving the immune mechanism: Secondary | ICD-10-CM | POA: Diagnosis not present

## 2018-05-16 DIAGNOSIS — Z8719 Personal history of other diseases of the digestive system: Secondary | ICD-10-CM | POA: Diagnosis not present

## 2018-05-16 DIAGNOSIS — Z8739 Personal history of other diseases of the musculoskeletal system and connective tissue: Secondary | ICD-10-CM | POA: Diagnosis not present

## 2018-05-16 DIAGNOSIS — Z8701 Personal history of pneumonia (recurrent): Secondary | ICD-10-CM | POA: Diagnosis not present

## 2018-05-20 DIAGNOSIS — I2510 Atherosclerotic heart disease of native coronary artery without angina pectoris: Secondary | ICD-10-CM | POA: Diagnosis not present

## 2018-05-20 DIAGNOSIS — I5030 Unspecified diastolic (congestive) heart failure: Secondary | ICD-10-CM | POA: Diagnosis not present

## 2018-05-20 DIAGNOSIS — I130 Hypertensive heart and chronic kidney disease with heart failure and stage 1 through stage 4 chronic kidney disease, or unspecified chronic kidney disease: Secondary | ICD-10-CM | POA: Diagnosis not present

## 2018-05-20 DIAGNOSIS — Z8719 Personal history of other diseases of the digestive system: Secondary | ICD-10-CM | POA: Diagnosis not present

## 2018-05-20 DIAGNOSIS — N184 Chronic kidney disease, stage 4 (severe): Secondary | ICD-10-CM | POA: Diagnosis not present

## 2018-05-20 DIAGNOSIS — Z8639 Personal history of other endocrine, nutritional and metabolic disease: Secondary | ICD-10-CM | POA: Diagnosis not present

## 2018-05-30 DIAGNOSIS — N184 Chronic kidney disease, stage 4 (severe): Secondary | ICD-10-CM | POA: Diagnosis not present

## 2018-05-30 DIAGNOSIS — Z8719 Personal history of other diseases of the digestive system: Secondary | ICD-10-CM | POA: Diagnosis not present

## 2018-05-30 DIAGNOSIS — Z8639 Personal history of other endocrine, nutritional and metabolic disease: Secondary | ICD-10-CM | POA: Diagnosis not present

## 2018-05-30 DIAGNOSIS — I5030 Unspecified diastolic (congestive) heart failure: Secondary | ICD-10-CM | POA: Diagnosis not present

## 2018-05-30 DIAGNOSIS — I130 Hypertensive heart and chronic kidney disease with heart failure and stage 1 through stage 4 chronic kidney disease, or unspecified chronic kidney disease: Secondary | ICD-10-CM | POA: Diagnosis not present

## 2018-05-30 DIAGNOSIS — I2510 Atherosclerotic heart disease of native coronary artery without angina pectoris: Secondary | ICD-10-CM | POA: Diagnosis not present

## 2018-06-06 DIAGNOSIS — I130 Hypertensive heart and chronic kidney disease with heart failure and stage 1 through stage 4 chronic kidney disease, or unspecified chronic kidney disease: Secondary | ICD-10-CM | POA: Diagnosis not present

## 2018-06-06 DIAGNOSIS — N184 Chronic kidney disease, stage 4 (severe): Secondary | ICD-10-CM | POA: Diagnosis not present

## 2018-06-06 DIAGNOSIS — Z8639 Personal history of other endocrine, nutritional and metabolic disease: Secondary | ICD-10-CM | POA: Diagnosis not present

## 2018-06-06 DIAGNOSIS — Z8719 Personal history of other diseases of the digestive system: Secondary | ICD-10-CM | POA: Diagnosis not present

## 2018-06-06 DIAGNOSIS — I5030 Unspecified diastolic (congestive) heart failure: Secondary | ICD-10-CM | POA: Diagnosis not present

## 2018-06-06 DIAGNOSIS — I2510 Atherosclerotic heart disease of native coronary artery without angina pectoris: Secondary | ICD-10-CM | POA: Diagnosis not present

## 2018-06-16 DIAGNOSIS — I130 Hypertensive heart and chronic kidney disease with heart failure and stage 1 through stage 4 chronic kidney disease, or unspecified chronic kidney disease: Secondary | ICD-10-CM | POA: Diagnosis not present

## 2018-06-16 DIAGNOSIS — E1122 Type 2 diabetes mellitus with diabetic chronic kidney disease: Secondary | ICD-10-CM | POA: Diagnosis not present

## 2018-06-16 DIAGNOSIS — N184 Chronic kidney disease, stage 4 (severe): Secondary | ICD-10-CM | POA: Diagnosis not present

## 2018-06-16 DIAGNOSIS — Z8719 Personal history of other diseases of the digestive system: Secondary | ICD-10-CM | POA: Diagnosis not present

## 2018-06-16 DIAGNOSIS — I2510 Atherosclerotic heart disease of native coronary artery without angina pectoris: Secondary | ICD-10-CM | POA: Diagnosis not present

## 2018-06-16 DIAGNOSIS — M1990 Unspecified osteoarthritis, unspecified site: Secondary | ICD-10-CM | POA: Diagnosis not present

## 2018-06-16 DIAGNOSIS — E785 Hyperlipidemia, unspecified: Secondary | ICD-10-CM | POA: Diagnosis not present

## 2018-06-16 DIAGNOSIS — Z8701 Personal history of pneumonia (recurrent): Secondary | ICD-10-CM | POA: Diagnosis not present

## 2018-06-16 DIAGNOSIS — D649 Anemia, unspecified: Secondary | ICD-10-CM | POA: Diagnosis not present

## 2018-06-16 DIAGNOSIS — I5030 Unspecified diastolic (congestive) heart failure: Secondary | ICD-10-CM | POA: Diagnosis not present

## 2018-06-16 DIAGNOSIS — Z8639 Personal history of other endocrine, nutritional and metabolic disease: Secondary | ICD-10-CM | POA: Diagnosis not present

## 2018-06-16 DIAGNOSIS — E119 Type 2 diabetes mellitus without complications: Secondary | ICD-10-CM | POA: Diagnosis not present

## 2018-06-19 DIAGNOSIS — E1122 Type 2 diabetes mellitus with diabetic chronic kidney disease: Secondary | ICD-10-CM | POA: Diagnosis not present

## 2018-06-19 DIAGNOSIS — Z8719 Personal history of other diseases of the digestive system: Secondary | ICD-10-CM | POA: Diagnosis not present

## 2018-06-19 DIAGNOSIS — I5030 Unspecified diastolic (congestive) heart failure: Secondary | ICD-10-CM | POA: Diagnosis not present

## 2018-06-19 DIAGNOSIS — I130 Hypertensive heart and chronic kidney disease with heart failure and stage 1 through stage 4 chronic kidney disease, or unspecified chronic kidney disease: Secondary | ICD-10-CM | POA: Diagnosis not present

## 2018-06-19 DIAGNOSIS — N184 Chronic kidney disease, stage 4 (severe): Secondary | ICD-10-CM | POA: Diagnosis not present

## 2018-06-19 DIAGNOSIS — I2510 Atherosclerotic heart disease of native coronary artery without angina pectoris: Secondary | ICD-10-CM | POA: Diagnosis not present

## 2018-06-23 DIAGNOSIS — Z8719 Personal history of other diseases of the digestive system: Secondary | ICD-10-CM | POA: Diagnosis not present

## 2018-06-23 DIAGNOSIS — N184 Chronic kidney disease, stage 4 (severe): Secondary | ICD-10-CM | POA: Diagnosis not present

## 2018-06-23 DIAGNOSIS — I2510 Atherosclerotic heart disease of native coronary artery without angina pectoris: Secondary | ICD-10-CM | POA: Diagnosis not present

## 2018-06-23 DIAGNOSIS — I5030 Unspecified diastolic (congestive) heart failure: Secondary | ICD-10-CM | POA: Diagnosis not present

## 2018-06-23 DIAGNOSIS — I130 Hypertensive heart and chronic kidney disease with heart failure and stage 1 through stage 4 chronic kidney disease, or unspecified chronic kidney disease: Secondary | ICD-10-CM | POA: Diagnosis not present

## 2018-06-23 DIAGNOSIS — E1122 Type 2 diabetes mellitus with diabetic chronic kidney disease: Secondary | ICD-10-CM | POA: Diagnosis not present

## 2018-06-24 DIAGNOSIS — I5030 Unspecified diastolic (congestive) heart failure: Secondary | ICD-10-CM | POA: Diagnosis not present

## 2018-06-24 DIAGNOSIS — E1122 Type 2 diabetes mellitus with diabetic chronic kidney disease: Secondary | ICD-10-CM | POA: Diagnosis not present

## 2018-06-24 DIAGNOSIS — I130 Hypertensive heart and chronic kidney disease with heart failure and stage 1 through stage 4 chronic kidney disease, or unspecified chronic kidney disease: Secondary | ICD-10-CM | POA: Diagnosis not present

## 2018-06-24 DIAGNOSIS — I2510 Atherosclerotic heart disease of native coronary artery without angina pectoris: Secondary | ICD-10-CM | POA: Diagnosis not present

## 2018-06-24 DIAGNOSIS — Z8719 Personal history of other diseases of the digestive system: Secondary | ICD-10-CM | POA: Diagnosis not present

## 2018-06-24 DIAGNOSIS — N184 Chronic kidney disease, stage 4 (severe): Secondary | ICD-10-CM | POA: Diagnosis not present

## 2018-06-25 DIAGNOSIS — N184 Chronic kidney disease, stage 4 (severe): Secondary | ICD-10-CM | POA: Diagnosis not present

## 2018-06-25 DIAGNOSIS — Z8719 Personal history of other diseases of the digestive system: Secondary | ICD-10-CM | POA: Diagnosis not present

## 2018-06-25 DIAGNOSIS — I130 Hypertensive heart and chronic kidney disease with heart failure and stage 1 through stage 4 chronic kidney disease, or unspecified chronic kidney disease: Secondary | ICD-10-CM | POA: Diagnosis not present

## 2018-06-25 DIAGNOSIS — E1122 Type 2 diabetes mellitus with diabetic chronic kidney disease: Secondary | ICD-10-CM | POA: Diagnosis not present

## 2018-06-25 DIAGNOSIS — I2510 Atherosclerotic heart disease of native coronary artery without angina pectoris: Secondary | ICD-10-CM | POA: Diagnosis not present

## 2018-06-25 DIAGNOSIS — I5030 Unspecified diastolic (congestive) heart failure: Secondary | ICD-10-CM | POA: Diagnosis not present

## 2018-06-30 DIAGNOSIS — I130 Hypertensive heart and chronic kidney disease with heart failure and stage 1 through stage 4 chronic kidney disease, or unspecified chronic kidney disease: Secondary | ICD-10-CM | POA: Diagnosis not present

## 2018-06-30 DIAGNOSIS — N184 Chronic kidney disease, stage 4 (severe): Secondary | ICD-10-CM | POA: Diagnosis not present

## 2018-06-30 DIAGNOSIS — Z8719 Personal history of other diseases of the digestive system: Secondary | ICD-10-CM | POA: Diagnosis not present

## 2018-06-30 DIAGNOSIS — E1122 Type 2 diabetes mellitus with diabetic chronic kidney disease: Secondary | ICD-10-CM | POA: Diagnosis not present

## 2018-06-30 DIAGNOSIS — I2510 Atherosclerotic heart disease of native coronary artery without angina pectoris: Secondary | ICD-10-CM | POA: Diagnosis not present

## 2018-06-30 DIAGNOSIS — I5030 Unspecified diastolic (congestive) heart failure: Secondary | ICD-10-CM | POA: Diagnosis not present

## 2018-07-07 DIAGNOSIS — I5030 Unspecified diastolic (congestive) heart failure: Secondary | ICD-10-CM | POA: Diagnosis not present

## 2018-07-07 DIAGNOSIS — N184 Chronic kidney disease, stage 4 (severe): Secondary | ICD-10-CM | POA: Diagnosis not present

## 2018-07-07 DIAGNOSIS — E1122 Type 2 diabetes mellitus with diabetic chronic kidney disease: Secondary | ICD-10-CM | POA: Diagnosis not present

## 2018-07-07 DIAGNOSIS — I130 Hypertensive heart and chronic kidney disease with heart failure and stage 1 through stage 4 chronic kidney disease, or unspecified chronic kidney disease: Secondary | ICD-10-CM | POA: Diagnosis not present

## 2018-07-07 DIAGNOSIS — I2510 Atherosclerotic heart disease of native coronary artery without angina pectoris: Secondary | ICD-10-CM | POA: Diagnosis not present

## 2018-07-07 DIAGNOSIS — Z8719 Personal history of other diseases of the digestive system: Secondary | ICD-10-CM | POA: Diagnosis not present

## 2018-07-09 DIAGNOSIS — I130 Hypertensive heart and chronic kidney disease with heart failure and stage 1 through stage 4 chronic kidney disease, or unspecified chronic kidney disease: Secondary | ICD-10-CM | POA: Diagnosis not present

## 2018-07-09 DIAGNOSIS — E1122 Type 2 diabetes mellitus with diabetic chronic kidney disease: Secondary | ICD-10-CM | POA: Diagnosis not present

## 2018-07-09 DIAGNOSIS — N184 Chronic kidney disease, stage 4 (severe): Secondary | ICD-10-CM | POA: Diagnosis not present

## 2018-07-09 DIAGNOSIS — I5030 Unspecified diastolic (congestive) heart failure: Secondary | ICD-10-CM | POA: Diagnosis not present

## 2018-07-09 DIAGNOSIS — I2510 Atherosclerotic heart disease of native coronary artery without angina pectoris: Secondary | ICD-10-CM | POA: Diagnosis not present

## 2018-07-09 DIAGNOSIS — Z8719 Personal history of other diseases of the digestive system: Secondary | ICD-10-CM | POA: Diagnosis not present

## 2018-07-11 DIAGNOSIS — I130 Hypertensive heart and chronic kidney disease with heart failure and stage 1 through stage 4 chronic kidney disease, or unspecified chronic kidney disease: Secondary | ICD-10-CM | POA: Diagnosis not present

## 2018-07-11 DIAGNOSIS — N184 Chronic kidney disease, stage 4 (severe): Secondary | ICD-10-CM | POA: Diagnosis not present

## 2018-07-11 DIAGNOSIS — Z8719 Personal history of other diseases of the digestive system: Secondary | ICD-10-CM | POA: Diagnosis not present

## 2018-07-11 DIAGNOSIS — I2510 Atherosclerotic heart disease of native coronary artery without angina pectoris: Secondary | ICD-10-CM | POA: Diagnosis not present

## 2018-07-11 DIAGNOSIS — E1122 Type 2 diabetes mellitus with diabetic chronic kidney disease: Secondary | ICD-10-CM | POA: Diagnosis not present

## 2018-07-11 DIAGNOSIS — I5030 Unspecified diastolic (congestive) heart failure: Secondary | ICD-10-CM | POA: Diagnosis not present

## 2018-07-14 DIAGNOSIS — I2510 Atherosclerotic heart disease of native coronary artery without angina pectoris: Secondary | ICD-10-CM | POA: Diagnosis not present

## 2018-07-14 DIAGNOSIS — I5030 Unspecified diastolic (congestive) heart failure: Secondary | ICD-10-CM | POA: Diagnosis not present

## 2018-07-14 DIAGNOSIS — N184 Chronic kidney disease, stage 4 (severe): Secondary | ICD-10-CM | POA: Diagnosis not present

## 2018-07-14 DIAGNOSIS — I130 Hypertensive heart and chronic kidney disease with heart failure and stage 1 through stage 4 chronic kidney disease, or unspecified chronic kidney disease: Secondary | ICD-10-CM | POA: Diagnosis not present

## 2018-07-14 DIAGNOSIS — Z8719 Personal history of other diseases of the digestive system: Secondary | ICD-10-CM | POA: Diagnosis not present

## 2018-07-14 DIAGNOSIS — E1122 Type 2 diabetes mellitus with diabetic chronic kidney disease: Secondary | ICD-10-CM | POA: Diagnosis not present

## 2018-07-15 DIAGNOSIS — N184 Chronic kidney disease, stage 4 (severe): Secondary | ICD-10-CM | POA: Diagnosis not present

## 2018-07-15 DIAGNOSIS — Z8719 Personal history of other diseases of the digestive system: Secondary | ICD-10-CM | POA: Diagnosis not present

## 2018-07-15 DIAGNOSIS — I130 Hypertensive heart and chronic kidney disease with heart failure and stage 1 through stage 4 chronic kidney disease, or unspecified chronic kidney disease: Secondary | ICD-10-CM | POA: Diagnosis not present

## 2018-07-15 DIAGNOSIS — I5030 Unspecified diastolic (congestive) heart failure: Secondary | ICD-10-CM | POA: Diagnosis not present

## 2018-07-15 DIAGNOSIS — I2510 Atherosclerotic heart disease of native coronary artery without angina pectoris: Secondary | ICD-10-CM | POA: Diagnosis not present

## 2018-07-15 DIAGNOSIS — E1122 Type 2 diabetes mellitus with diabetic chronic kidney disease: Secondary | ICD-10-CM | POA: Diagnosis not present

## 2018-07-16 DIAGNOSIS — D649 Anemia, unspecified: Secondary | ICD-10-CM | POA: Diagnosis not present

## 2018-07-16 DIAGNOSIS — Z8701 Personal history of pneumonia (recurrent): Secondary | ICD-10-CM | POA: Diagnosis not present

## 2018-07-16 DIAGNOSIS — M1990 Unspecified osteoarthritis, unspecified site: Secondary | ICD-10-CM | POA: Diagnosis not present

## 2018-07-16 DIAGNOSIS — I5030 Unspecified diastolic (congestive) heart failure: Secondary | ICD-10-CM | POA: Diagnosis not present

## 2018-07-16 DIAGNOSIS — Z8639 Personal history of other endocrine, nutritional and metabolic disease: Secondary | ICD-10-CM | POA: Diagnosis not present

## 2018-07-16 DIAGNOSIS — N184 Chronic kidney disease, stage 4 (severe): Secondary | ICD-10-CM | POA: Diagnosis not present

## 2018-07-16 DIAGNOSIS — I130 Hypertensive heart and chronic kidney disease with heart failure and stage 1 through stage 4 chronic kidney disease, or unspecified chronic kidney disease: Secondary | ICD-10-CM | POA: Diagnosis not present

## 2018-07-16 DIAGNOSIS — Z8719 Personal history of other diseases of the digestive system: Secondary | ICD-10-CM | POA: Diagnosis not present

## 2018-07-16 DIAGNOSIS — I2510 Atherosclerotic heart disease of native coronary artery without angina pectoris: Secondary | ICD-10-CM | POA: Diagnosis not present

## 2018-07-16 DIAGNOSIS — E785 Hyperlipidemia, unspecified: Secondary | ICD-10-CM | POA: Diagnosis not present

## 2018-07-16 DIAGNOSIS — E1122 Type 2 diabetes mellitus with diabetic chronic kidney disease: Secondary | ICD-10-CM | POA: Diagnosis not present

## 2018-07-17 DIAGNOSIS — I5030 Unspecified diastolic (congestive) heart failure: Secondary | ICD-10-CM | POA: Diagnosis not present

## 2018-07-17 DIAGNOSIS — N184 Chronic kidney disease, stage 4 (severe): Secondary | ICD-10-CM | POA: Diagnosis not present

## 2018-07-17 DIAGNOSIS — I2510 Atherosclerotic heart disease of native coronary artery without angina pectoris: Secondary | ICD-10-CM | POA: Diagnosis not present

## 2018-07-17 DIAGNOSIS — E1122 Type 2 diabetes mellitus with diabetic chronic kidney disease: Secondary | ICD-10-CM | POA: Diagnosis not present

## 2018-07-17 DIAGNOSIS — I130 Hypertensive heart and chronic kidney disease with heart failure and stage 1 through stage 4 chronic kidney disease, or unspecified chronic kidney disease: Secondary | ICD-10-CM | POA: Diagnosis not present

## 2018-07-17 DIAGNOSIS — Z8719 Personal history of other diseases of the digestive system: Secondary | ICD-10-CM | POA: Diagnosis not present

## 2018-07-18 DIAGNOSIS — I5030 Unspecified diastolic (congestive) heart failure: Secondary | ICD-10-CM | POA: Diagnosis not present

## 2018-07-18 DIAGNOSIS — I2510 Atherosclerotic heart disease of native coronary artery without angina pectoris: Secondary | ICD-10-CM | POA: Diagnosis not present

## 2018-07-18 DIAGNOSIS — Z8719 Personal history of other diseases of the digestive system: Secondary | ICD-10-CM | POA: Diagnosis not present

## 2018-07-18 DIAGNOSIS — N184 Chronic kidney disease, stage 4 (severe): Secondary | ICD-10-CM | POA: Diagnosis not present

## 2018-07-18 DIAGNOSIS — I130 Hypertensive heart and chronic kidney disease with heart failure and stage 1 through stage 4 chronic kidney disease, or unspecified chronic kidney disease: Secondary | ICD-10-CM | POA: Diagnosis not present

## 2018-07-18 DIAGNOSIS — E1122 Type 2 diabetes mellitus with diabetic chronic kidney disease: Secondary | ICD-10-CM | POA: Diagnosis not present

## 2018-07-20 DIAGNOSIS — I130 Hypertensive heart and chronic kidney disease with heart failure and stage 1 through stage 4 chronic kidney disease, or unspecified chronic kidney disease: Secondary | ICD-10-CM | POA: Diagnosis not present

## 2018-07-20 DIAGNOSIS — E1122 Type 2 diabetes mellitus with diabetic chronic kidney disease: Secondary | ICD-10-CM | POA: Diagnosis not present

## 2018-07-20 DIAGNOSIS — N184 Chronic kidney disease, stage 4 (severe): Secondary | ICD-10-CM | POA: Diagnosis not present

## 2018-07-20 DIAGNOSIS — I5030 Unspecified diastolic (congestive) heart failure: Secondary | ICD-10-CM | POA: Diagnosis not present

## 2018-07-20 DIAGNOSIS — I2510 Atherosclerotic heart disease of native coronary artery without angina pectoris: Secondary | ICD-10-CM | POA: Diagnosis not present

## 2018-07-20 DIAGNOSIS — Z8719 Personal history of other diseases of the digestive system: Secondary | ICD-10-CM | POA: Diagnosis not present

## 2018-07-21 DIAGNOSIS — I130 Hypertensive heart and chronic kidney disease with heart failure and stage 1 through stage 4 chronic kidney disease, or unspecified chronic kidney disease: Secondary | ICD-10-CM | POA: Diagnosis not present

## 2018-07-21 DIAGNOSIS — E1122 Type 2 diabetes mellitus with diabetic chronic kidney disease: Secondary | ICD-10-CM | POA: Diagnosis not present

## 2018-07-21 DIAGNOSIS — Z8719 Personal history of other diseases of the digestive system: Secondary | ICD-10-CM | POA: Diagnosis not present

## 2018-07-21 DIAGNOSIS — I2510 Atherosclerotic heart disease of native coronary artery without angina pectoris: Secondary | ICD-10-CM | POA: Diagnosis not present

## 2018-07-21 DIAGNOSIS — I5030 Unspecified diastolic (congestive) heart failure: Secondary | ICD-10-CM | POA: Diagnosis not present

## 2018-07-21 DIAGNOSIS — N184 Chronic kidney disease, stage 4 (severe): Secondary | ICD-10-CM | POA: Diagnosis not present

## 2018-07-22 DIAGNOSIS — N184 Chronic kidney disease, stage 4 (severe): Secondary | ICD-10-CM | POA: Diagnosis not present

## 2018-07-22 DIAGNOSIS — I130 Hypertensive heart and chronic kidney disease with heart failure and stage 1 through stage 4 chronic kidney disease, or unspecified chronic kidney disease: Secondary | ICD-10-CM | POA: Diagnosis not present

## 2018-07-22 DIAGNOSIS — I5030 Unspecified diastolic (congestive) heart failure: Secondary | ICD-10-CM | POA: Diagnosis not present

## 2018-07-22 DIAGNOSIS — I2510 Atherosclerotic heart disease of native coronary artery without angina pectoris: Secondary | ICD-10-CM | POA: Diagnosis not present

## 2018-07-22 DIAGNOSIS — Z8719 Personal history of other diseases of the digestive system: Secondary | ICD-10-CM | POA: Diagnosis not present

## 2018-07-22 DIAGNOSIS — E1122 Type 2 diabetes mellitus with diabetic chronic kidney disease: Secondary | ICD-10-CM | POA: Diagnosis not present

## 2018-07-23 DIAGNOSIS — N184 Chronic kidney disease, stage 4 (severe): Secondary | ICD-10-CM | POA: Diagnosis not present

## 2018-07-23 DIAGNOSIS — Z8719 Personal history of other diseases of the digestive system: Secondary | ICD-10-CM | POA: Diagnosis not present

## 2018-07-23 DIAGNOSIS — E1122 Type 2 diabetes mellitus with diabetic chronic kidney disease: Secondary | ICD-10-CM | POA: Diagnosis not present

## 2018-07-23 DIAGNOSIS — I2510 Atherosclerotic heart disease of native coronary artery without angina pectoris: Secondary | ICD-10-CM | POA: Diagnosis not present

## 2018-07-23 DIAGNOSIS — I5030 Unspecified diastolic (congestive) heart failure: Secondary | ICD-10-CM | POA: Diagnosis not present

## 2018-07-23 DIAGNOSIS — I130 Hypertensive heart and chronic kidney disease with heart failure and stage 1 through stage 4 chronic kidney disease, or unspecified chronic kidney disease: Secondary | ICD-10-CM | POA: Diagnosis not present

## 2018-07-24 DIAGNOSIS — I2510 Atherosclerotic heart disease of native coronary artery without angina pectoris: Secondary | ICD-10-CM | POA: Diagnosis not present

## 2018-07-24 DIAGNOSIS — I130 Hypertensive heart and chronic kidney disease with heart failure and stage 1 through stage 4 chronic kidney disease, or unspecified chronic kidney disease: Secondary | ICD-10-CM | POA: Diagnosis not present

## 2018-07-24 DIAGNOSIS — Z8719 Personal history of other diseases of the digestive system: Secondary | ICD-10-CM | POA: Diagnosis not present

## 2018-07-24 DIAGNOSIS — N184 Chronic kidney disease, stage 4 (severe): Secondary | ICD-10-CM | POA: Diagnosis not present

## 2018-07-24 DIAGNOSIS — I5030 Unspecified diastolic (congestive) heart failure: Secondary | ICD-10-CM | POA: Diagnosis not present

## 2018-07-24 DIAGNOSIS — E1122 Type 2 diabetes mellitus with diabetic chronic kidney disease: Secondary | ICD-10-CM | POA: Diagnosis not present

## 2018-07-25 DIAGNOSIS — I5030 Unspecified diastolic (congestive) heart failure: Secondary | ICD-10-CM | POA: Diagnosis not present

## 2018-07-25 DIAGNOSIS — I130 Hypertensive heart and chronic kidney disease with heart failure and stage 1 through stage 4 chronic kidney disease, or unspecified chronic kidney disease: Secondary | ICD-10-CM | POA: Diagnosis not present

## 2018-07-25 DIAGNOSIS — E1122 Type 2 diabetes mellitus with diabetic chronic kidney disease: Secondary | ICD-10-CM | POA: Diagnosis not present

## 2018-07-25 DIAGNOSIS — N184 Chronic kidney disease, stage 4 (severe): Secondary | ICD-10-CM | POA: Diagnosis not present

## 2018-07-25 DIAGNOSIS — I2510 Atherosclerotic heart disease of native coronary artery without angina pectoris: Secondary | ICD-10-CM | POA: Diagnosis not present

## 2018-07-25 DIAGNOSIS — Z8719 Personal history of other diseases of the digestive system: Secondary | ICD-10-CM | POA: Diagnosis not present

## 2018-07-26 DIAGNOSIS — I5030 Unspecified diastolic (congestive) heart failure: Secondary | ICD-10-CM | POA: Diagnosis not present

## 2018-07-26 DIAGNOSIS — I2510 Atherosclerotic heart disease of native coronary artery without angina pectoris: Secondary | ICD-10-CM | POA: Diagnosis not present

## 2018-07-26 DIAGNOSIS — N184 Chronic kidney disease, stage 4 (severe): Secondary | ICD-10-CM | POA: Diagnosis not present

## 2018-07-26 DIAGNOSIS — E1122 Type 2 diabetes mellitus with diabetic chronic kidney disease: Secondary | ICD-10-CM | POA: Diagnosis not present

## 2018-07-26 DIAGNOSIS — Z8719 Personal history of other diseases of the digestive system: Secondary | ICD-10-CM | POA: Diagnosis not present

## 2018-07-26 DIAGNOSIS — I130 Hypertensive heart and chronic kidney disease with heart failure and stage 1 through stage 4 chronic kidney disease, or unspecified chronic kidney disease: Secondary | ICD-10-CM | POA: Diagnosis not present

## 2018-07-27 DIAGNOSIS — I5030 Unspecified diastolic (congestive) heart failure: Secondary | ICD-10-CM | POA: Diagnosis not present

## 2018-07-27 DIAGNOSIS — E1122 Type 2 diabetes mellitus with diabetic chronic kidney disease: Secondary | ICD-10-CM | POA: Diagnosis not present

## 2018-07-27 DIAGNOSIS — N184 Chronic kidney disease, stage 4 (severe): Secondary | ICD-10-CM | POA: Diagnosis not present

## 2018-07-27 DIAGNOSIS — Z8719 Personal history of other diseases of the digestive system: Secondary | ICD-10-CM | POA: Diagnosis not present

## 2018-07-27 DIAGNOSIS — I2510 Atherosclerotic heart disease of native coronary artery without angina pectoris: Secondary | ICD-10-CM | POA: Diagnosis not present

## 2018-07-27 DIAGNOSIS — I130 Hypertensive heart and chronic kidney disease with heart failure and stage 1 through stage 4 chronic kidney disease, or unspecified chronic kidney disease: Secondary | ICD-10-CM | POA: Diagnosis not present

## 2018-07-28 DIAGNOSIS — I130 Hypertensive heart and chronic kidney disease with heart failure and stage 1 through stage 4 chronic kidney disease, or unspecified chronic kidney disease: Secondary | ICD-10-CM | POA: Diagnosis not present

## 2018-07-28 DIAGNOSIS — Z8719 Personal history of other diseases of the digestive system: Secondary | ICD-10-CM | POA: Diagnosis not present

## 2018-07-28 DIAGNOSIS — I2510 Atherosclerotic heart disease of native coronary artery without angina pectoris: Secondary | ICD-10-CM | POA: Diagnosis not present

## 2018-07-28 DIAGNOSIS — N184 Chronic kidney disease, stage 4 (severe): Secondary | ICD-10-CM | POA: Diagnosis not present

## 2018-07-28 DIAGNOSIS — E1122 Type 2 diabetes mellitus with diabetic chronic kidney disease: Secondary | ICD-10-CM | POA: Diagnosis not present

## 2018-07-28 DIAGNOSIS — I5030 Unspecified diastolic (congestive) heart failure: Secondary | ICD-10-CM | POA: Diagnosis not present

## 2018-08-14 ENCOUNTER — Other Ambulatory Visit: Payer: Self-pay

## 2018-08-16 DEATH — deceased

## 2019-06-05 IMAGING — DX DG LUMBAR SPINE COMPLETE 4+V
5 series · 5 of 5 positions shown · non-contrast
Comparison: None.

CLINICAL DATA: Lumbago with radicular symptoms bilaterally

EXAM:
LUMBAR SPINE - COMPLETE 4+ VIEW

[l-spine ap]
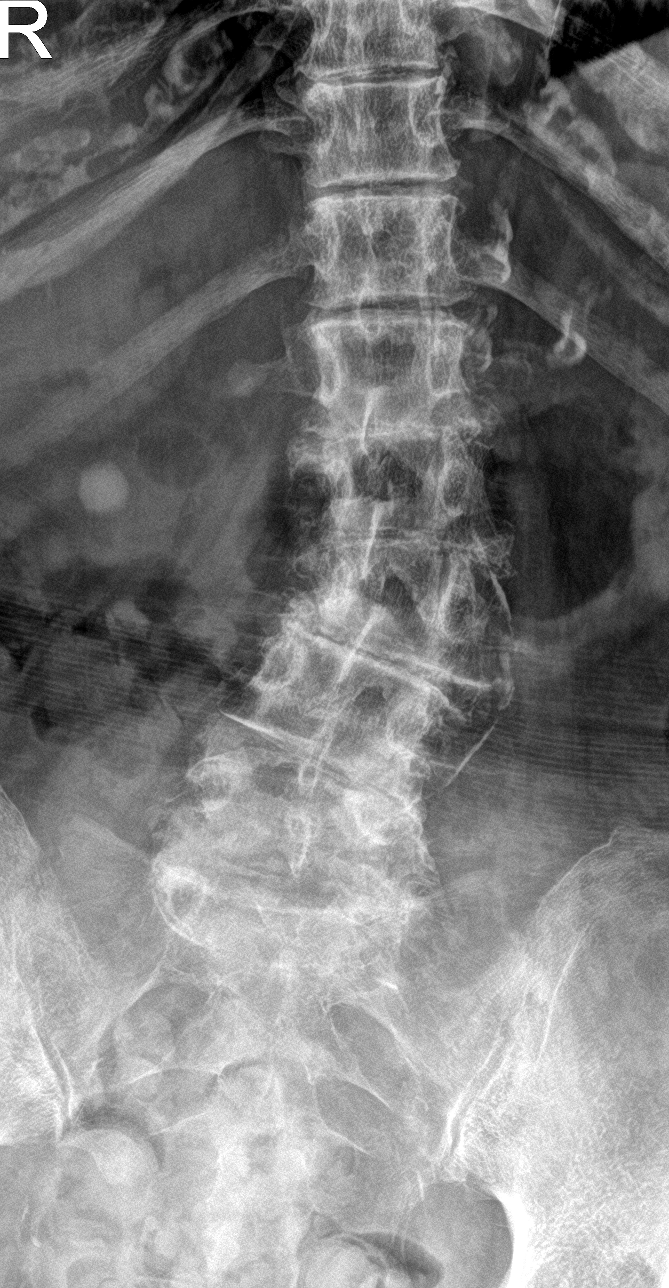

[l-spine obl (1 of 2)]
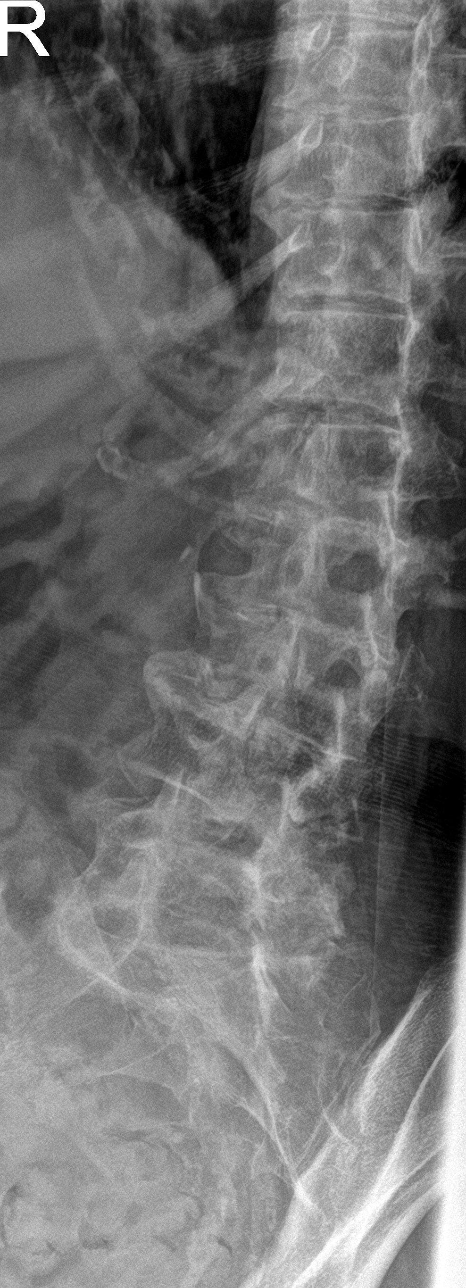

[l-spine obl (2 of 2)]
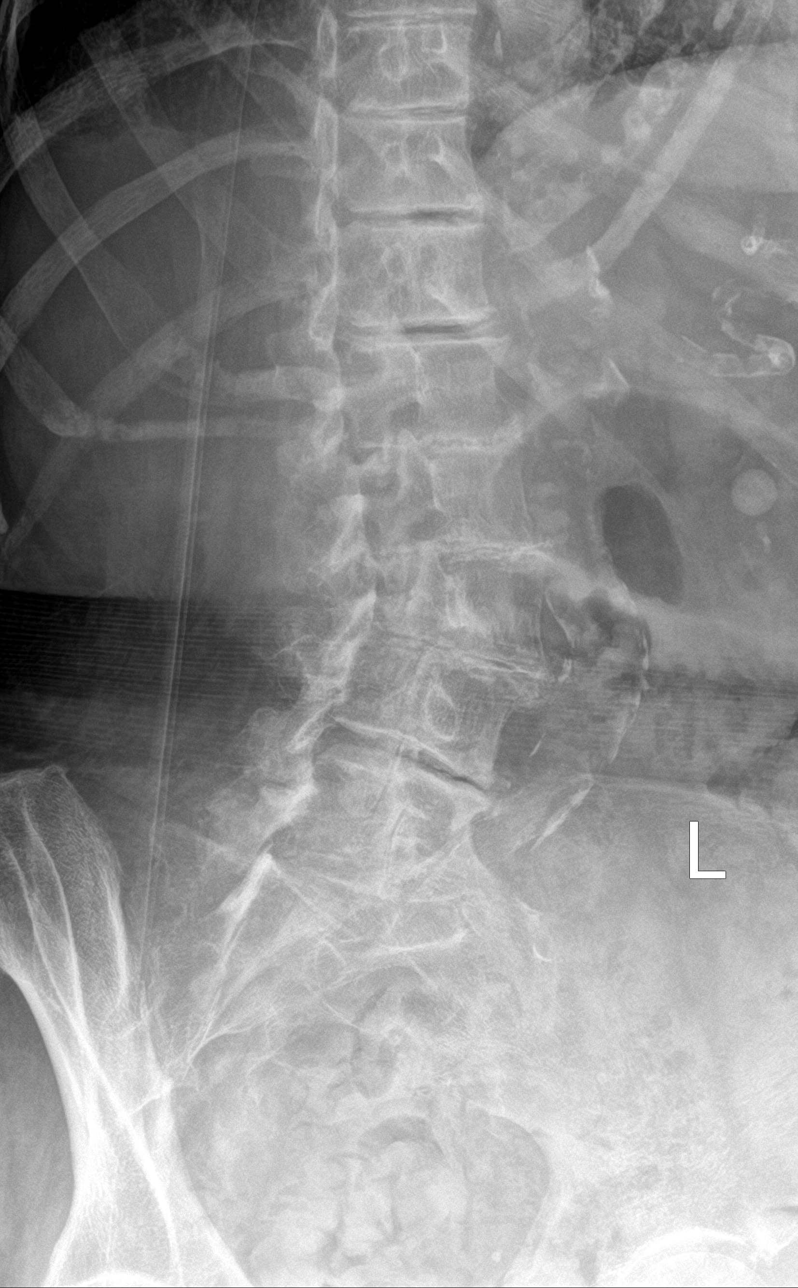

[l-spine lat]
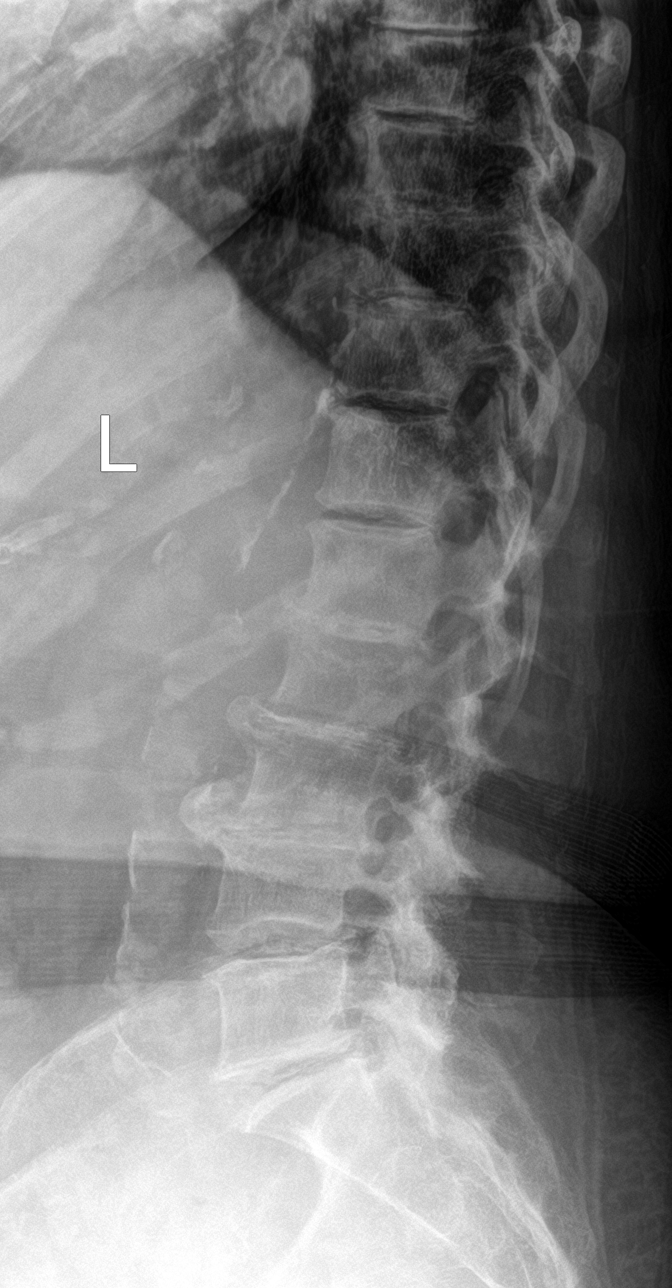

[l-spine spot]
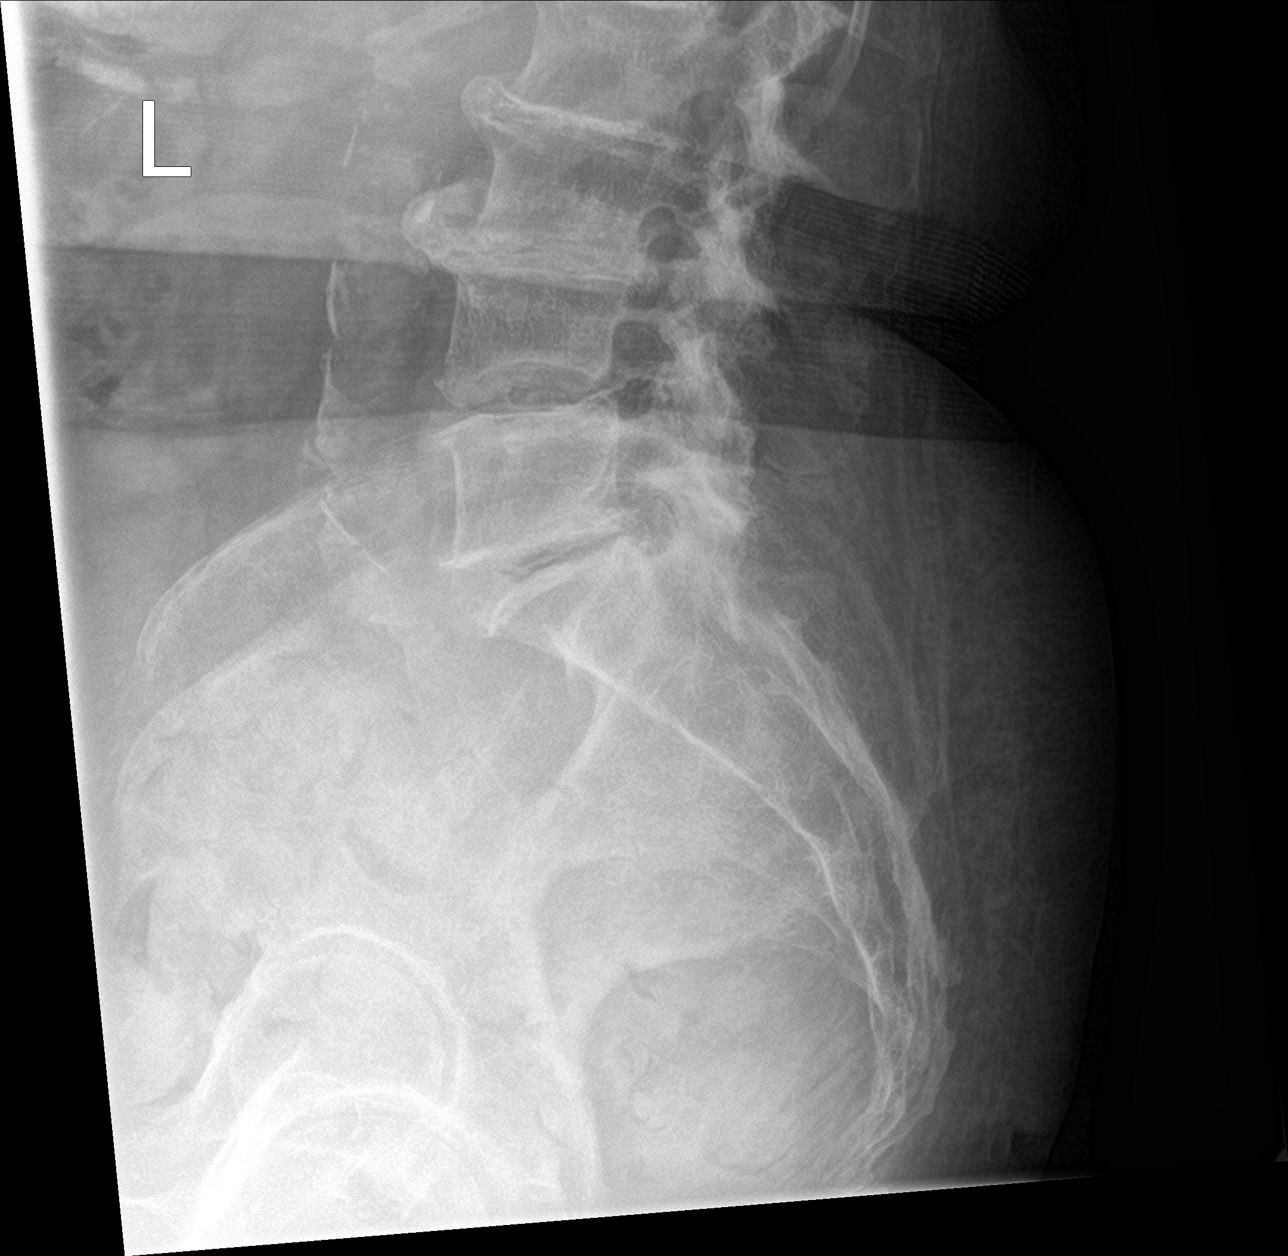

[5 of 5 positions shown; findings below may reference images not displayed]

FINDINGS: Frontal, lateral, spot lumbosacral lateral, and bilateral oblique
views were obtained. There are 5 non-rib-bearing lumbar type
vertebral bodies. There is levoscoliosis with rotatory component.
There is no fracture or spondylolisthesis. There is severe disc
space narrowing at all levels. There are prominent anterior
osteophytes at L1-2, L2-3, and L3-4. There is facet osteoarthritic
change at all levels bilaterally.

There is aortoiliac atherosclerosis. There is a calcification 1.5 x
1.5 cm in the right abdomen, likely in the right kidney.
IMPRESSION: Scoliosis with multilevel arthropathy. No acute fracture or
spondylolisthesis.

There is aortoiliac atherosclerosis.

Probable 1.5 cm right renal calculus.

Aortic Atherosclerosis ([id number].C).
